# Patient Record
Sex: Male | Born: 1945
Health system: Southern US, Community
[De-identification: ages and names within clinical notes are randomized; demographics above are authoritative.]

## PROBLEM LIST (undated history)

## (undated) DIAGNOSIS — Z973 Presence of spectacles and contact lenses: Secondary | ICD-10-CM

## (undated) DIAGNOSIS — K219 Gastro-esophageal reflux disease without esophagitis: Secondary | ICD-10-CM

## (undated) DIAGNOSIS — G5603 Carpal tunnel syndrome, bilateral upper limbs: Secondary | ICD-10-CM

## (undated) DIAGNOSIS — R7303 Prediabetes: Secondary | ICD-10-CM

## (undated) DIAGNOSIS — R112 Nausea with vomiting, unspecified: Secondary | ICD-10-CM

## (undated) DIAGNOSIS — G5600 Carpal tunnel syndrome, unspecified upper limb: Secondary | ICD-10-CM

## (undated) DIAGNOSIS — J189 Pneumonia, unspecified organism: Secondary | ICD-10-CM

## (undated) DIAGNOSIS — R7309 Other abnormal glucose: Secondary | ICD-10-CM

## (undated) DIAGNOSIS — I10 Essential (primary) hypertension: Secondary | ICD-10-CM

## (undated) DIAGNOSIS — E785 Hyperlipidemia, unspecified: Secondary | ICD-10-CM

## (undated) DIAGNOSIS — I4891 Unspecified atrial fibrillation: Secondary | ICD-10-CM

## (undated) DIAGNOSIS — N400 Enlarged prostate without lower urinary tract symptoms: Secondary | ICD-10-CM

## (undated) DIAGNOSIS — H269 Unspecified cataract: Secondary | ICD-10-CM

## (undated) DIAGNOSIS — Z9889 Other specified postprocedural states: Secondary | ICD-10-CM

## (undated) DIAGNOSIS — E559 Vitamin D deficiency, unspecified: Secondary | ICD-10-CM

## (undated) DIAGNOSIS — E669 Obesity, unspecified: Secondary | ICD-10-CM

## (undated) DIAGNOSIS — F419 Anxiety disorder, unspecified: Secondary | ICD-10-CM

## (undated) DIAGNOSIS — R102 Pelvic and perineal pain: Secondary | ICD-10-CM

## (undated) DIAGNOSIS — K589 Irritable bowel syndrome without diarrhea: Secondary | ICD-10-CM

## (undated) DIAGNOSIS — M1990 Unspecified osteoarthritis, unspecified site: Secondary | ICD-10-CM

## (undated) DIAGNOSIS — Z85828 Personal history of other malignant neoplasm of skin: Secondary | ICD-10-CM

## (undated) DIAGNOSIS — M5020 Other cervical disc displacement, unspecified cervical region: Secondary | ICD-10-CM

## (undated) HISTORY — DX: Irritable bowel syndrome, unspecified: K58.9

## (undated) HISTORY — PX: TONSILLECTOMY: SUR1361

## (undated) HISTORY — DX: Benign prostatic hyperplasia without lower urinary tract symptoms: N40.0

## (undated) HISTORY — DX: Obesity, unspecified: E66.9

## (undated) HISTORY — PX: HERNIA REPAIR: SHX51

## (undated) HISTORY — DX: Personal history of other malignant neoplasm of skin: Z85.828

## (undated) HISTORY — DX: Prediabetes: R73.03

## (undated) HISTORY — DX: Essential (primary) hypertension: I10

## (undated) HISTORY — DX: Other abnormal glucose: R73.09

## (undated) HISTORY — DX: Vitamin D deficiency, unspecified: E55.9

## (undated) HISTORY — DX: Hyperlipidemia, unspecified: E78.5

---

## 1968-07-11 HISTORY — PX: OTHER SURGICAL HISTORY: SHX169

## 1978-07-11 HISTORY — PX: OTHER SURGICAL HISTORY: SHX169

## 1988-07-11 HISTORY — PX: ANTERIOR CRUCIATE LIGAMENT REPAIR: SHX115

## 1997-07-11 HISTORY — PX: HIP SURGERY: SHX245

## 1997-12-19 ENCOUNTER — Ambulatory Visit (HOSPITAL_COMMUNITY): Admission: RE | Admit: 1997-12-19 | Discharge: 1997-12-19 | Payer: Self-pay | Admitting: Internal Medicine

## 1998-07-11 HISTORY — PX: CERVICAL FUSION: SHX112

## 1999-06-10 ENCOUNTER — Other Ambulatory Visit: Admission: RE | Admit: 1999-06-10 | Discharge: 1999-06-10 | Payer: Self-pay | Admitting: Urology

## 2000-07-11 HISTORY — PX: OTHER SURGICAL HISTORY: SHX169

## 2001-07-11 HISTORY — PX: UMBILICAL HERNIA REPAIR: SHX196

## 2001-12-19 ENCOUNTER — Encounter: Payer: Self-pay | Admitting: Neurosurgery

## 2001-12-19 ENCOUNTER — Encounter: Admission: RE | Admit: 2001-12-19 | Discharge: 2001-12-19 | Payer: Self-pay | Admitting: Neurosurgery

## 2002-01-25 ENCOUNTER — Ambulatory Visit (HOSPITAL_COMMUNITY): Admission: RE | Admit: 2002-01-25 | Discharge: 2002-01-26 | Payer: Self-pay | Admitting: Neurosurgery

## 2002-01-25 ENCOUNTER — Encounter: Payer: Self-pay | Admitting: Neurosurgery

## 2002-01-25 HISTORY — PX: ANTERIOR CERVICAL DECOMP/DISCECTOMY FUSION: SHX1161

## 2002-02-19 ENCOUNTER — Encounter: Payer: Self-pay | Admitting: Neurosurgery

## 2002-02-19 ENCOUNTER — Encounter: Admission: RE | Admit: 2002-02-19 | Discharge: 2002-02-19 | Payer: Self-pay | Admitting: Neurosurgery

## 2004-07-21 ENCOUNTER — Ambulatory Visit: Payer: Self-pay | Admitting: Gastroenterology

## 2004-08-04 ENCOUNTER — Encounter: Admission: RE | Admit: 2004-08-04 | Discharge: 2004-08-04 | Payer: Self-pay | Admitting: Neurosurgery

## 2004-08-20 ENCOUNTER — Encounter: Admission: RE | Admit: 2004-08-20 | Discharge: 2004-08-20 | Payer: Self-pay | Admitting: Neurosurgery

## 2004-11-04 ENCOUNTER — Encounter: Admission: RE | Admit: 2004-11-04 | Discharge: 2004-11-04 | Payer: Self-pay | Admitting: Neurosurgery

## 2005-06-08 ENCOUNTER — Encounter: Admission: RE | Admit: 2005-06-08 | Discharge: 2005-06-08 | Payer: Self-pay | Admitting: Orthopedic Surgery

## 2005-07-11 DIAGNOSIS — I10 Essential (primary) hypertension: Secondary | ICD-10-CM

## 2005-07-11 HISTORY — DX: Essential (primary) hypertension: I10

## 2005-09-20 ENCOUNTER — Encounter: Admission: RE | Admit: 2005-09-20 | Discharge: 2005-09-20 | Payer: Self-pay | Admitting: Orthopedic Surgery

## 2006-01-18 ENCOUNTER — Encounter: Admission: RE | Admit: 2006-01-18 | Discharge: 2006-01-18 | Payer: Self-pay | Admitting: Orthopedic Surgery

## 2006-02-03 ENCOUNTER — Encounter: Admission: RE | Admit: 2006-02-03 | Discharge: 2006-02-03 | Payer: Self-pay | Admitting: Orthopedic Surgery

## 2006-07-21 ENCOUNTER — Ambulatory Visit: Payer: Self-pay | Admitting: Gastroenterology

## 2006-08-08 ENCOUNTER — Ambulatory Visit: Payer: Self-pay | Admitting: Gastroenterology

## 2007-02-02 ENCOUNTER — Encounter: Admission: RE | Admit: 2007-02-02 | Discharge: 2007-02-02 | Payer: Self-pay | Admitting: Orthopedic Surgery

## 2007-02-07 ENCOUNTER — Encounter (INDEPENDENT_AMBULATORY_CARE_PROVIDER_SITE_OTHER): Payer: Self-pay | Admitting: General Surgery

## 2007-02-07 ENCOUNTER — Observation Stay (HOSPITAL_COMMUNITY): Admission: EM | Admit: 2007-02-07 | Discharge: 2007-02-08 | Payer: Self-pay | Admitting: Emergency Medicine

## 2007-02-07 HISTORY — PX: APPENDECTOMY: SHX54

## 2007-02-21 ENCOUNTER — Encounter: Admission: RE | Admit: 2007-02-21 | Discharge: 2007-02-21 | Payer: Self-pay | Admitting: Orthopedic Surgery

## 2007-04-06 ENCOUNTER — Encounter: Admission: RE | Admit: 2007-04-06 | Discharge: 2007-04-06 | Payer: Self-pay | Admitting: Orthopedic Surgery

## 2007-09-22 ENCOUNTER — Emergency Department (HOSPITAL_COMMUNITY): Admission: EM | Admit: 2007-09-22 | Discharge: 2007-09-22 | Payer: Self-pay | Admitting: Emergency Medicine

## 2009-05-19 ENCOUNTER — Encounter: Admission: RE | Admit: 2009-05-19 | Discharge: 2009-05-19 | Payer: Self-pay | Admitting: Orthopedic Surgery

## 2010-11-12 ENCOUNTER — Other Ambulatory Visit: Payer: Self-pay | Admitting: Orthopedic Surgery

## 2010-11-12 DIAGNOSIS — M549 Dorsalgia, unspecified: Secondary | ICD-10-CM

## 2010-11-23 NOTE — Consult Note (Signed)
NAME:  [name], CONLEY NO.:  [id number]   MEDICAL RECORD NO.:  [id number]          PATIENT TYPE:  EMS   LOCATION:  ED                           FACILITY:  [facility]   PHYSICIAN:  Sigmund I. Patsi Sears, M.D.DATE OF BIRTH:  [birth year]   DATE OF CONSULTATION:  09/22/2007  DATE OF DISCHARGE:                                 CONSULTATION   SUBJECT:  Acute urinary retention in a 65 year old white male who is 10  days status post negative transrectal needle biopsy of the prostate.   The patient was voiding well after his prostate biopsy but noted severe  dysuria, an episode of gross hematuria over the last 24 hours.  This was  associated with drinking two beers.  The patient has been unable to void  and has severe urethral pain.  He took a Urised but it did not take away  his pain.  He has continued to be unable to void.  The patient was  instructed to come to the emergency room, where a Foley catheter was  placed and he was given a gentamicin injection.   ALLERGIES:  None known.   HOME MEDICATIONS:  1. Crestor 20 mg one per day.  2. Atenolol 100 mg one per day.  3. Enalapril 20 mg one per day.  4. Multivitamins one per day.   His weight is 248 pounds.   Tobacco none, alcohol none.   SOCIAL HISTORY:  The patient lives by himself.   A constitutional review of systems is otherwise noncontributory.   FAMILY HISTORY:  Noncontributory.   PAST MEDICAL HISTORY:  Significant for hypertension.   Physical exam today shows a well-developed, well-nourished white male,  in no acute distress after Foley catheter placed.  VITAL SIGNS:  Blood pressure 165/85, temperature 97, pulse 67,  respiratory rate 20.  NECK:  Supple, nontender, no nodes.  CHEST:  Clear to P&A.  ABDOMEN:  Soft, plus bowel sounds, no organomegaly, without masses.  Penis shows a normal circumcised penis.  Meatus is normal.  Scrotum is  normal with normal testicles palpated bilaterally.  There is no  tenderness or erythema.  The spermatic cord is normal bilaterally.  Perineum is normal.  Foley catheter is in place.  EXTREMITIES:  No cyanosis, no edema.   Emergency room urinalysis showed specific gravity 1.013, pH 6.0.  Dipstick is negative.   ASSESSMENT:  Acute urinary retention with one episode of hematuria,  probably secondary to his biopsy from 10 days ago, with severe dysuria.  The patient is covered with gentamicin, despite normal urinalysis, for  possible prostatitis.  He was given gentamicin 120 mg one-time dose.  He  will be sent home on Septra Double Strength one b.i.d. and Flomax.  He  is discharged in stable condition.  He will return Monday for a voiding  trial.      Sigmund I. Patsi Sears, M.D.  Electronically Signed     SIT/MEDQ  D:  09/22/2007  T:  09/23/2007  Job:  [id number]

## 2010-11-23 NOTE — Op Note (Signed)
[name], Johnathan NO.:  [id number]   MEDICAL RECORD NO.:  [id number]          PATIENT TYPE:  INP   LOCATION:  0098                         FACILITY:  [facility]   PHYSICIAN:  Angelia Mould. Derrell Lolling, M.D.DATE OF BIRTH:  07-29-1945   DATE OF PROCEDURE:  02/07/2007  DATE OF DISCHARGE:                               OPERATIVE REPORT   PREOPERATIVE DIAGNOSIS:  Acute appendicitis.   POSTOPERATIVE DIAGNOSIS:  Acute appendicitis.   OPERATION PERFORMED:  Laparoscopic appendectomy.   SURGEON:  Dr. Claud Kelp.   OPERATIVE INDICATIONS:  This is a 65 year old white man who has been in  good health recently.  He presents with an 18-hour history of right  lower quadrant pain and anorexia.  Dr. Oneta Rack sent him for a CT scan at  [facility] and this shows acute appendicitis but no evidence of  abscess.  He came to the emergency room and was then evaluated by the  emergency department physician.  I was then called.  He is brought to  the operating room urgently.   OPERATIVE FINDINGS:  The appendix was acutely inflamed but was not  ruptured.  There was some purulent fluid around it.  There was no  evidence of gangrene.  The last 3 or 4 feet of terminal ileum looked  normal.  The right colon looked normal.  There was no abscess.   OPERATIVE TECHNIQUE:  Following the induction of general endotracheal  anesthesia the patient's abdomen was prepped and draped in a sterile  fashion.  The patient was identified as the correct patient and correct  procedure.  A Foley catheter had been inserted prior to the abdominal  prep.  Intravenous antibiotics had been given prior to the incision.  An  11-mm OptiVu port was placed in the left rectus sheath just above the  umbilicus.  This was uneventful.  Pneumoperitoneum was created.  The  video camera was inserted with visualization and findings as described  above.   A 5-mm trocar was placed in the left rectus sheath below the umbilicus  and an 11 mm trocar was placed in the suprapubic area.  We identified  the terminal ileum and appendix and cecum.  We elevated the appendix and  examined it both medially and laterally.  We divided the appendiceal  mesentery and appendiceal artery using the Harmonic scalpel.  We took  this dissection down until we could clearly identify the junction of the  appendix with the cecum.   The appendix was then transected using an Endo-GIA stapling device.  This was placed perpendicular across the appendix at the level of the  cecum, closed, held in place for 30 seconds and then fired and removed.  The appendix was placed in the specimen bag and removed.  The right  lower quadrant was irrigated.  The staple line looked excellent.  There  was no bleeding or hematoma.  All the irrigation fluid was removed.  The  trocars were removed under direct vision and there was no bleeding from  the trocar sites.  The pneumoperitoneum was released.  The skin  incisions were closed with subcuticular  sutures of 4-0 Monocryl and  Steri-Strips.  Clean bandages were placed and the patient was taken to  the recovery room in stable condition.  Estimated blood loss was about  10 mL.  Complications none.  Sponge, needle and instrument counts were  correct.      Angelia Mould. Derrell Lolling, M.D.  Electronically Signed     HMI/MEDQ  D:  02/07/2007  T:  02/08/2007  Job:  [id number]   cc:   [name], M.D.  Fax: [phone]

## 2010-11-23 NOTE — H&P (Signed)
[name], GAINS NO.:  [id number]   MEDICAL RECORD NO.:  [id number]          PATIENT TYPE:  INP   LOCATION:  0098                         FACILITY:  [facility]   PHYSICIAN:  Angelia Mould. Derrell Lolling, M.D.DATE OF BIRTH:  02-Jun-1946   DATE OF ADMISSION:  02/07/2007  DATE OF DISCHARGE:                              HISTORY & PHYSICAL   CHIEF COMPLAINT:  Abdominal pain.   HISTORY OF PRESENT ILLNESS:  This is a 65 year old, white man who  presents with an 18-hour history of gassy, crampy, abdominal pain and  more steady right lower quadrant pain and anorexia.  He denies nausea or  vomiting.  Denies fever or chills.  He had a normal bowel movement  today.  He has not had any prior similar episodes.   Dr. Oneta Rack sent him for a CT scan and [facility].  The report shows  a fluid filled, enlarged, inflamed appendix.  There is no sign of  abscess.  The patient was sent to the Emergency Department.  The patient  was seen by Dr. Freida Busman.  I was then called at that point.  I agree with  the diagnosis of appendicitis and advised the patient to undergo  appendectomy which he agrees to.   PAST HISTORY:  Hemorrhoid problems.  Elevated PSA, borderline, followed  by Dr. Patsi Sears for benign prostatic hypertrophy.  Degenerative joint  disease.  Degenerative joint disease.  Left inguinal hernia repair in  1970.  Umbilical hernia repair.  Left rotator cuff repair.  Left hip  surgery.  Right knee surgery.  Left ankle surgery.  Neck surgeries x2.   CURRENT MEDICATIONS:  1. Atenolol 100 mg daily.  2. Crestor 20 mg daily.  3. Vitamin C.  4. Fish oil.  5. Aspirin 81 mg.  6. Antiinflammatories.   DRUG ALLERGIES:  NONE KNOWN.   FAMILY HISTORY:  Father deceased alcohol problems, coronary artery  disease, and myocardial infarction.  Mother deceased cerebral  hemorrhage.  He is an only child.   SOCIAL HISTORY:  The patient is a widow.  His wife died of lymphoma  about a year ago.  He is  semi-retired.  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  Denies tobacco.  Drinks 3-5 alcoholic  beverages a week.   REVIEW OF SYSTEMS:  A 15 system review of systems is performed and is  noncontributory except as described above.   PHYSICAL EXAMINATION:  GENERAL:  Pleasant, middle-aged man who appears  to be in mild distress, very cooperative.  VITAL SIGNS:  Pending.  EYES:  Sclerae are clear.  Extraocular movements intact.  EARS, NOSE, MOUTH, AND THROAT:  Nose, lips, tongue, and oropharynx are  without gross lesions.  NECK:  Supple, nontender.  No mass.  No jugular distension.  LUNGS:  Clear to auscultation.  No chest wall tenderness.  HEART:  Regular rate and rhythm.  Radial and femoral pulses are  palpable.  ABDOMEN:  Soft.  Localized tenderness with involuntary guarding in the  right lower quadrant.  Some spasticity there.  No mass noted anywhere.  Well-healed transverse scar above the umbilicus.  Well-healed left groin  scar.  GENITOURINARY:  No evidence of inguinal adenopathy or hernia.  EXTREMITIES:  Moves all 4 extremities well without pain or deformity.  NEUROLOGIC:  No gross motor or sensory deficits.   ADMISSION DATA:  White blood cell count 15,000, hemoglobin 17.  The rest  of his lab work is pending.  The CT scan is described above.   ASSESSMENT:  Acute appendicitis.   PLAN:  The patient will be taken to Operating Room for diagnostic  laparoscopy and laparoscopic appendectomy.   I have discussed the indication and details of surgery with the patient.  Risks and complications have been outlined, including but not limited to  bleeding, infection, conversion to open laparotomy, alternative  diagnosis such as inflammatory neoplasm, diverticulitis, Meckel's,  diverticulitis, and so forth.  Also discussed cardiac, pulmonary, and  thromboembolic problems and injuries to adjacent organs.  He seems to  understand these issues well.  At this time, all  of his questions are  answered.  He is in full agreement with this plan.      Angelia Mould. Derrell Lolling, M.D.  Electronically Signed     HMI/MEDQ  D:  02/07/2007  T:  02/08/2007  Job:  [id number]   cc:   [name], M.D.  Fax: [phone]

## 2010-11-24 ENCOUNTER — Other Ambulatory Visit: Payer: Self-pay | Admitting: Orthopedic Surgery

## 2010-11-24 ENCOUNTER — Ambulatory Visit
Admission: RE | Admit: 2010-11-24 | Discharge: 2010-11-24 | Disposition: A | Discharge status: Home / Self Care | Payer: BC Managed Care – PPO | Source: Ambulatory Visit | Attending: Orthopedic Surgery | Admitting: Orthopedic Surgery

## 2010-11-24 DIAGNOSIS — M549 Dorsalgia, unspecified: Secondary | ICD-10-CM

## 2010-11-25 ENCOUNTER — Other Ambulatory Visit: Payer: Self-pay

## 2010-11-26 NOTE — Assessment & Plan Note (Signed)
[name]                         GASTROENTEROLOGY OFFICE NOTE   NAME:[name], [name]                    MRN:          [id number]  DATE:07/21/2005                            DOB:          08/19/1945    PROBLEM:  Rectal bleeding.   [name] is a pleasant 65 year old white male referred through the  courtesy of Dr. Oneta Rack for evaluation.  On intermittent occasions he  has had limited bright red blood per rectum which he attributes to a  hemorrhoid.  He denies rectal pain or itching.  This had been an  intermittent problem over the past 2 years.  He denies change of bowel  habits, or frank hematochezia, or abdominal pain.   PAST MEDICAL HISTORY:  Pertinent for hypertension, status post hip  surgery and herniorrhaphy.   FAMILY HISTORY:  Noncontributory.   MEDICATIONS:  1. Crestor.  2. Zolpidem.  3. Atenolol.  4. Enalapril.   HE HAS NO ALLERGIES.   He does not smoke, he drinks rarely, he is widowed and in sales.   REVIEW OF SYSTEMS:  Was reviewed and was negative.   PHYSICAL EXAMINATION:  Pulse 64, blood pressure 126/80, weight 251.  HEENT: EOMI. PERRLA. Sclerae are anicteric.  Conjunctivae are pink.  NECK:  Supple without thyromegaly, adenopathy or carotid bruits.  CHEST:  Clear to auscultation and percussion without adventitious  sounds.  CARDIAC:  Regular rhythm; normal S1 S2.  There are no murmurs, gallops  or rubs.  ABDOMEN:  Bowel sounds are normoactive.  Abdomen is soft, non-tender and  non-distended.  There are no abdominal masses, tenderness, splenic  enlargement or hepatomegaly.  EXTREMITIES:  Full range of motion.  No cyanosis, clubbing or edema.  RECTAL:  There are no external lesions.  There are no masses.  Stool is  Hemoccult negative.   IMPRESSION:  Limited rectal bleeding, likely secondary to hemorrhoids.   RECOMMENDATION:  1. AnaMantle HC 1 nightly for 7 days.  If bleeding is a persistent or      recurrent  problem, I would      consider band ligation.  2. Screening colonoscopy.     Barbette Hair. Johnathan Dice, MD,FACG  Electronically Signed    RDK/MedQ  DD: 07/21/2006  DT: 07/21/2006  Job #: [id number]   cc:   [name], M.D.
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
## 2010-11-26 NOTE — Letter (Signed)
July 21, 2006    [name], M.D.  [address]  [place], [place] [address]   RE:  HOVANES, HYMAS  MRN:  [id number]  /  DOB:  Nov 06, 1945   Dear Dr. Oneta Rack:   Upon your kind referral, I had the pleasure of evaluating your patient  and I am pleased to offer my findings.  I saw [name] in the  office today.  Enclosed is a copy of my progress note that details my  findings and recommendations.   Thank you for the opportunity to participate in your patient's care.    Sincerely,      Barbette Hair. Arlyce Dice, MD,FACG  Electronically Signed    RDK/MedQ  DD: 07/21/2006  DT: 07/21/2006  Job #: [id number]

## 2010-12-16 ENCOUNTER — Other Ambulatory Visit: Payer: Self-pay | Admitting: Orthopedic Surgery

## 2010-12-16 DIAGNOSIS — M25511 Pain in right shoulder: Secondary | ICD-10-CM

## 2010-12-28 ENCOUNTER — Ambulatory Visit
Admission: RE | Admit: 2010-12-28 | Discharge: 2010-12-28 | Disposition: A | Discharge status: Home / Self Care | Payer: BC Managed Care – PPO | Source: Ambulatory Visit | Attending: Orthopedic Surgery | Admitting: Orthopedic Surgery

## 2010-12-28 DIAGNOSIS — M25511 Pain in right shoulder: Secondary | ICD-10-CM

## 2011-04-04 LAB — URINALYSIS, ROUTINE W REFLEX MICROSCOPIC
Ketones, ur: NEGATIVE
Nitrite: NEGATIVE
pH: 6

## 2011-04-25 LAB — APTT: aPTT: 27

## 2011-04-25 LAB — DIFFERENTIAL
Basophils Absolute: 0
Basophils Relative: 0
Neutro Abs: 12.4 — ABNORMAL HIGH
Neutrophils Relative %: 82 — ABNORMAL HIGH

## 2011-04-25 LAB — CBC
HCT: 49.1
MCHC: 35.2
MCV: 93.5
Platelets: 214
RDW: 14

## 2011-04-25 LAB — URINALYSIS, ROUTINE W REFLEX MICROSCOPIC
Bilirubin Urine: NEGATIVE
Ketones, ur: NEGATIVE
Nitrite: NEGATIVE
Protein, ur: 30 — AB
Specific Gravity, Urine: >1.046 — ABNORMAL HIGH
Urobilinogen, UA: 0.2

## 2011-04-25 LAB — COMPREHENSIVE METABOLIC PANEL
Albumin: 4.5
BUN: 12
Creatinine, Ser: 0.83
Total Protein: 7.7

## 2011-04-25 LAB — PROTIME-INR: INR: 1

## 2011-04-25 LAB — URINE MICROSCOPIC-ADD ON

## 2011-05-23 ENCOUNTER — Ambulatory Visit (INDEPENDENT_AMBULATORY_CARE_PROVIDER_SITE_OTHER): Payer: Medicare Other | Admitting: Gastroenterology

## 2011-05-23 ENCOUNTER — Encounter: Payer: Self-pay | Admitting: Gastroenterology

## 2011-05-23 DIAGNOSIS — R1032 Left lower quadrant pain: Secondary | ICD-10-CM

## 2011-05-23 DIAGNOSIS — K625 Hemorrhage of anus and rectum: Secondary | ICD-10-CM

## 2011-05-23 MED ORDER — HYOSCYAMINE SULFATE 0.125 MG SL SUBL: 0.2500 mg | SUBLINGUAL_TABLET | SUBLINGUAL | Status: DC | PRN

## 2011-05-23 NOTE — Progress Notes (Signed)
Mr. Dec is a 65 year old white male referred at the request of [name], MD for evaluation of rectal bleeding. Approximately one month ago he noted small amounts of bright red blood in the toilet water. He also noticed some streaking of blood on his stool. Symptoms lasted about 4 days. There has been no recurrence of bleeding. He denies rectal pain. He occasionally has left lower quadrant discomfort which has been a problem for over 30 years. Colonoscopy in 2010  demonstrated diverticulosis and hemorrhoids.      Past Medical History  Diagnosis Date  . Hypertension   . Hyperlipidemia    Past Surgical History  Procedure Date  . Hernia repair   . Knee arthroscopy   . Appendectomy   . Shoulder arthroscopy   . Ankle arthroscopy    Current Outpatient Prescriptions  Medication Sig Dispense Refill  . aspirin 81 MG tablet Take 81 mg by mouth daily.        Marland Kitchen atenolol (TENORMIN) 25 MG tablet Take 25 mg by mouth daily.        . enalapril (VASOTEC) 5 MG tablet Take 5 mg by mouth daily.        . rosuvastatin (CRESTOR) 10 MG tablet Take 10 mg by mouth daily.          reports that he has never smoked. He has never used smokeless tobacco. He reports that he drinks alcohol. He reports that he does not use illicit drugs. family history includes Heart disease in his father and mother.    Review of Systems: Pertinent positive and negative review of systems were noted in the above HPI section. All other review of systems were otherwise negative.  Vital signs were reviewed in today's medical record Physical Exam: General: Well developed , well nourished, no acute distress Head: Normocephalic and atraumatic Eyes:  sclerae anicteric, EOMI Ears: Normal auditory acuity Mouth: No deformity or lesions Neck: Supple, no masses or thyromegaly Lungs: Clear throughout to auscultation Heart: Regular rate and rhythm; no murmurs, rubs or bruits Abdomen: Soft, non tender and non distended. No  masses, hepatosplenomegaly or hernias noted. Normal Bowel sounds Rectal:deferred Musculoskeletal: Symmetrical with no gross deformities  Skin: No lesions on visible extremities Pulses:  Normal pulses noted Extremities: No clubbing, cyanosis, edema or deformities noted Neurological: Alert oriented x 4, grossly nonfocal Cervical Nodes:  No significant cervical adenopathy Inguinal Nodes: No significant inguinal adenopathy Psychological:  Alert and cooperative. Normal mood and affect

## 2011-05-23 NOTE — Patient Instructions (Signed)
Flexible Sigmoidoscopy Your caregiver has ordered a flexible sigmoidoscopy. This is an exam to evaluate your lower colon. In this exam your colon is cleansed and a short fiber optic tube is inserted through your rectum and into your colon. The fiber optic scope (endoscope) is a short bundle of enclosed flexible small glass fibers. It transmits light to the area examined and images from that area to your caregiver. You do not have to worry about glass breakage in the endoscope. Discomfort is usually minimal. Sedatives and pain medications are generally not required. This exam helps to detect tumors (lumps), polyps, inflammation (swelling and soreness), and areas of bleeding. It may also be used to take biopsies. These are small pieces of tissue taken to examine under a microscope. LET YOUR CAREGIVER KNOW ABOUT:  Allergies.   Medications taken including herbs, eye drops, over the counter medications, and creams.   Use of steroids (by mouth or creams).   Previous problems with anesthetics or novocaine   Possibility of pregnancy, if this applies.   History of blood clots (thrombophlebitis).   History of bleeding or blood problems.   Previous surgery.   Other health problems.  BEFORE THE PROCEDURE Eat normally the night before the exam. Your caregiver may order a mild enema or laxative the night before. No eating or drinking should occur after midnight until the procedure is completed. A rectal suppository or enemas may be given in the morning prior to your procedure. You will be brought to the examination area in a hospital gown. You should be present 60 minutes prior to your procedure or as directed.  AFTER THE PROCEDURE   There is sometimes a little blood passed with the first bowel movement. Do not be concerned. Because air is often used during the exam, it is not unusual to pass gas and experience abdominal (belly) cramping. Walking or a warm pack on your abdomen may help with this. Do not  sleep with a heating pad as burns can occur.   You may resume all normal eating and activities.   Only take over-the-counter or prescription medicines for pain, discomfort, or fever as directed by your caregiver. Do not use aspirin or blood thinners if a biopsy (tissue sample) was taken. Consult your caregiver for medication usage if biopsies were taken.   Call for your results as instructed by your caregiver. Remember, it is your responsibility to obtain the results of your biopsy. Do not assume everything is fine because you do not hear from your caregiver.  SEEK IMMEDIATE MEDICAL CARE IF:  An oral temperature above 102 F (38.9 C) develops.   You pass large blood clots or fill a toilet with blood following the procedure. This may also occur 10 to 14 days following the procedure. It is more likely if a biopsy was taken.   You develop abdominal pain not relieved with medication or that is getting worse rather than better.  Document Released: 06/24/2000 Document Revised: 03/09/2011 Document Reviewed: 04/06/2005 [facility] Patient Information 2012 [place], [place].

## 2011-05-23 NOTE — Assessment & Plan Note (Signed)
This likely is due to hemorrhoidal bleeding or bleeding from the distal colon.  Recommendations #1 flexible sigmoidoscopy

## 2011-05-23 NOTE — Assessment & Plan Note (Addendum)
This is very nonspecific pain of many years duration.  Recommendations #1 renew hyomax. He has been responsive to hyomax in the past.

## 2011-05-30 ENCOUNTER — Encounter: Payer: Self-pay | Admitting: Gastroenterology

## 2011-05-30 ENCOUNTER — Ambulatory Visit (AMBULATORY_SURGERY_CENTER): Payer: Medicare Other | Admitting: Gastroenterology

## 2011-05-30 DIAGNOSIS — K625 Hemorrhage of anus and rectum: Secondary | ICD-10-CM

## 2011-05-30 DIAGNOSIS — Z1211 Encounter for screening for malignant neoplasm of colon: Secondary | ICD-10-CM

## 2011-05-30 DIAGNOSIS — R1032 Left lower quadrant pain: Secondary | ICD-10-CM

## 2011-05-30 HISTORY — PX: COLONOSCOPY: SHX174

## 2011-05-30 MED ORDER — SODIUM CHLORIDE 0.9 % IV SOLN: 500.0000 mL | INTRAVENOUS | Status: DC

## 2011-05-30 NOTE — Progress Notes (Signed)
Patient did not experience any of the following events: a burn prior to discharge; a fall within the facility; wrong site/side/patient/procedure/implant event; or a hospital transfer or hospital admission upon discharge from the facility. (G8907) Patient did not have preoperative order for IV antibiotic SSI prophylaxis. (G8918)  

## 2011-05-30 NOTE — Patient Instructions (Signed)

## 2011-05-31 ENCOUNTER — Telehealth: Payer: Self-pay | Admitting: *Deleted

## 2011-05-31 NOTE — Telephone Encounter (Signed)

## 2011-08-08 DIAGNOSIS — M75120 Complete rotator cuff tear or rupture of unspecified shoulder, not specified as traumatic: Secondary | ICD-10-CM | POA: Diagnosis not present

## 2011-08-08 DIAGNOSIS — M67919 Unspecified disorder of synovium and tendon, unspecified shoulder: Secondary | ICD-10-CM | POA: Diagnosis not present

## 2011-08-08 DIAGNOSIS — S43439A Superior glenoid labrum lesion of unspecified shoulder, initial encounter: Secondary | ICD-10-CM | POA: Diagnosis not present

## 2011-09-01 DIAGNOSIS — J042 Acute laryngotracheitis: Secondary | ICD-10-CM | POA: Diagnosis not present

## 2011-09-01 DIAGNOSIS — J029 Acute pharyngitis, unspecified: Secondary | ICD-10-CM | POA: Diagnosis not present

## 2011-09-20 DIAGNOSIS — E559 Vitamin D deficiency, unspecified: Secondary | ICD-10-CM | POA: Diagnosis not present

## 2011-09-20 DIAGNOSIS — Z79899 Other long term (current) drug therapy: Secondary | ICD-10-CM | POA: Diagnosis not present

## 2011-09-20 DIAGNOSIS — I10 Essential (primary) hypertension: Secondary | ICD-10-CM | POA: Diagnosis not present

## 2011-09-20 DIAGNOSIS — N320 Bladder-neck obstruction: Secondary | ICD-10-CM | POA: Diagnosis not present

## 2011-09-20 DIAGNOSIS — N419 Inflammatory disease of prostate, unspecified: Secondary | ICD-10-CM | POA: Diagnosis not present

## 2011-09-20 DIAGNOSIS — R7309 Other abnormal glucose: Secondary | ICD-10-CM | POA: Diagnosis not present

## 2011-09-20 DIAGNOSIS — Z125 Encounter for screening for malignant neoplasm of prostate: Secondary | ICD-10-CM | POA: Diagnosis not present

## 2011-09-20 DIAGNOSIS — Z1212 Encounter for screening for malignant neoplasm of rectum: Secondary | ICD-10-CM | POA: Diagnosis not present

## 2011-09-20 DIAGNOSIS — E782 Mixed hyperlipidemia: Secondary | ICD-10-CM | POA: Diagnosis not present

## 2011-10-26 DIAGNOSIS — N401 Enlarged prostate with lower urinary tract symptoms: Secondary | ICD-10-CM | POA: Diagnosis not present

## 2011-10-26 DIAGNOSIS — E291 Testicular hypofunction: Secondary | ICD-10-CM | POA: Diagnosis not present

## 2011-11-02 DIAGNOSIS — N529 Male erectile dysfunction, unspecified: Secondary | ICD-10-CM | POA: Diagnosis not present

## 2011-11-02 DIAGNOSIS — R972 Elevated prostate specific antigen [PSA]: Secondary | ICD-10-CM | POA: Diagnosis not present

## 2011-11-02 DIAGNOSIS — E291 Testicular hypofunction: Secondary | ICD-10-CM | POA: Diagnosis not present

## 2011-11-02 DIAGNOSIS — N490 Inflammatory disorders of seminal vesicle: Secondary | ICD-10-CM | POA: Diagnosis not present

## 2011-11-22 DIAGNOSIS — N419 Inflammatory disease of prostate, unspecified: Secondary | ICD-10-CM | POA: Diagnosis not present

## 2011-11-22 DIAGNOSIS — R350 Frequency of micturition: Secondary | ICD-10-CM | POA: Diagnosis not present

## 2011-11-23 ENCOUNTER — Other Ambulatory Visit: Payer: Self-pay | Admitting: Urology

## 2011-12-01 ENCOUNTER — Encounter (HOSPITAL_BASED_OUTPATIENT_CLINIC_OR_DEPARTMENT_OTHER): Payer: Self-pay | Admitting: *Deleted

## 2011-12-02 ENCOUNTER — Encounter (HOSPITAL_BASED_OUTPATIENT_CLINIC_OR_DEPARTMENT_OTHER): Payer: Self-pay | Admitting: *Deleted

## 2011-12-02 NOTE — Progress Notes (Signed)
NPO AFTER MN. ARRIVES AT 1100. NEEDS ISTAT AND EKG.  

## 2011-12-08 ENCOUNTER — Encounter (HOSPITAL_BASED_OUTPATIENT_CLINIC_OR_DEPARTMENT_OTHER): Admission: RE | Payer: Self-pay | Source: Ambulatory Visit

## 2011-12-08 ENCOUNTER — Ambulatory Visit (HOSPITAL_BASED_OUTPATIENT_CLINIC_OR_DEPARTMENT_OTHER): Admission: RE | Admit: 2011-12-08 | Payer: Medicare Other | Source: Ambulatory Visit | Admitting: Urology

## 2011-12-08 DIAGNOSIS — L600 Ingrowing nail: Secondary | ICD-10-CM | POA: Diagnosis not present

## 2011-12-08 HISTORY — DX: Unspecified osteoarthritis, unspecified site: M19.90

## 2011-12-08 HISTORY — DX: Gastro-esophageal reflux disease without esophagitis: K21.9

## 2011-12-08 HISTORY — DX: Pelvic and perineal pain: R10.2

## 2011-12-08 HISTORY — DX: Benign prostatic hyperplasia without lower urinary tract symptoms: N40.0

## 2011-12-08 SURGERY — CYSTOSCOPY, WITH BLADDER HYDRODISTENSION
Anesthesia: General

## 2011-12-20 DIAGNOSIS — E559 Vitamin D deficiency, unspecified: Secondary | ICD-10-CM | POA: Diagnosis not present

## 2011-12-20 DIAGNOSIS — E782 Mixed hyperlipidemia: Secondary | ICD-10-CM | POA: Diagnosis not present

## 2011-12-20 DIAGNOSIS — I10 Essential (primary) hypertension: Secondary | ICD-10-CM | POA: Diagnosis not present

## 2011-12-20 DIAGNOSIS — Z79899 Other long term (current) drug therapy: Secondary | ICD-10-CM | POA: Diagnosis not present

## 2011-12-20 DIAGNOSIS — R7309 Other abnormal glucose: Secondary | ICD-10-CM | POA: Diagnosis not present

## 2012-01-09 DIAGNOSIS — N529 Male erectile dysfunction, unspecified: Secondary | ICD-10-CM | POA: Diagnosis not present

## 2012-01-09 DIAGNOSIS — N419 Inflammatory disease of prostate, unspecified: Secondary | ICD-10-CM | POA: Diagnosis not present

## 2012-01-16 DIAGNOSIS — M76899 Other specified enthesopathies of unspecified lower limb, excluding foot: Secondary | ICD-10-CM | POA: Diagnosis not present

## 2012-01-16 DIAGNOSIS — M75120 Complete rotator cuff tear or rupture of unspecified shoulder, not specified as traumatic: Secondary | ICD-10-CM | POA: Diagnosis not present

## 2012-01-28 ENCOUNTER — Other Ambulatory Visit: Payer: Self-pay | Admitting: Gastroenterology

## 2012-02-09 DIAGNOSIS — H1030 Unspecified acute conjunctivitis, unspecified eye: Secondary | ICD-10-CM | POA: Diagnosis not present

## 2012-02-21 DIAGNOSIS — H16209 Unspecified keratoconjunctivitis, unspecified eye: Secondary | ICD-10-CM | POA: Diagnosis not present

## 2012-02-29 DIAGNOSIS — H16209 Unspecified keratoconjunctivitis, unspecified eye: Secondary | ICD-10-CM | POA: Diagnosis not present

## 2012-03-29 DIAGNOSIS — Z79899 Other long term (current) drug therapy: Secondary | ICD-10-CM | POA: Diagnosis not present

## 2012-03-29 DIAGNOSIS — Z23 Encounter for immunization: Secondary | ICD-10-CM | POA: Diagnosis not present

## 2012-03-29 DIAGNOSIS — R7309 Other abnormal glucose: Secondary | ICD-10-CM | POA: Diagnosis not present

## 2012-03-29 DIAGNOSIS — E782 Mixed hyperlipidemia: Secondary | ICD-10-CM | POA: Diagnosis not present

## 2012-03-29 DIAGNOSIS — E559 Vitamin D deficiency, unspecified: Secondary | ICD-10-CM | POA: Diagnosis not present

## 2012-03-29 DIAGNOSIS — I10 Essential (primary) hypertension: Secondary | ICD-10-CM | POA: Diagnosis not present

## 2012-05-14 DIAGNOSIS — N401 Enlarged prostate with lower urinary tract symptoms: Secondary | ICD-10-CM | POA: Diagnosis not present

## 2012-06-28 DIAGNOSIS — Z79899 Other long term (current) drug therapy: Secondary | ICD-10-CM | POA: Diagnosis not present

## 2012-06-28 DIAGNOSIS — E559 Vitamin D deficiency, unspecified: Secondary | ICD-10-CM | POA: Diagnosis not present

## 2012-06-28 DIAGNOSIS — I10 Essential (primary) hypertension: Secondary | ICD-10-CM | POA: Diagnosis not present

## 2012-06-28 DIAGNOSIS — E782 Mixed hyperlipidemia: Secondary | ICD-10-CM | POA: Diagnosis not present

## 2012-06-28 DIAGNOSIS — R7309 Other abnormal glucose: Secondary | ICD-10-CM | POA: Diagnosis not present

## 2012-08-09 DIAGNOSIS — M6530 Trigger finger, unspecified finger: Secondary | ICD-10-CM | POA: Diagnosis not present

## 2012-09-25 DIAGNOSIS — Z79899 Other long term (current) drug therapy: Secondary | ICD-10-CM | POA: Diagnosis not present

## 2012-09-25 DIAGNOSIS — R972 Elevated prostate specific antigen [PSA]: Secondary | ICD-10-CM | POA: Diagnosis not present

## 2012-09-25 DIAGNOSIS — E559 Vitamin D deficiency, unspecified: Secondary | ICD-10-CM | POA: Diagnosis not present

## 2012-09-25 DIAGNOSIS — I10 Essential (primary) hypertension: Secondary | ICD-10-CM | POA: Diagnosis not present

## 2012-09-25 DIAGNOSIS — E782 Mixed hyperlipidemia: Secondary | ICD-10-CM | POA: Diagnosis not present

## 2012-09-25 DIAGNOSIS — Z1212 Encounter for screening for malignant neoplasm of rectum: Secondary | ICD-10-CM | POA: Diagnosis not present

## 2012-09-25 DIAGNOSIS — R7309 Other abnormal glucose: Secondary | ICD-10-CM | POA: Diagnosis not present

## 2012-10-11 ENCOUNTER — Ambulatory Visit (HOSPITAL_COMMUNITY): Payer: Medicare Other | Attending: Internal Medicine | Admitting: Radiology

## 2012-10-11 VITALS — BP 119/85 | HR 52 | Ht 72.0 in | Wt 244.0 lb

## 2012-10-11 DIAGNOSIS — R079 Chest pain, unspecified: Secondary | ICD-10-CM

## 2012-10-11 DIAGNOSIS — R002 Palpitations: Secondary | ICD-10-CM | POA: Diagnosis not present

## 2012-10-11 DIAGNOSIS — R5381 Other malaise: Secondary | ICD-10-CM | POA: Insufficient documentation

## 2012-10-11 DIAGNOSIS — R5383 Other fatigue: Secondary | ICD-10-CM | POA: Insufficient documentation

## 2012-10-11 DIAGNOSIS — R0789 Other chest pain: Secondary | ICD-10-CM | POA: Diagnosis not present

## 2012-10-11 MED ORDER — TECHNETIUM TC 99M SESTAMIBI GENERIC - CARDIOLITE
10.0000 | Freq: Once | INTRAVENOUS | Status: AC | PRN
  Administered 2012-10-11: 10 via INTRAVENOUS

## 2012-10-11 MED ORDER — REGADENOSON 0.4 MG/5ML IV SOLN
0.4000 mg | Freq: Once | INTRAVENOUS | Status: AC
  Administered 2012-10-11: 0.4 mg via INTRAVENOUS

## 2012-10-11 MED ORDER — TECHNETIUM TC 99M SESTAMIBI GENERIC - CARDIOLITE
30.0000 | Freq: Once | INTRAVENOUS | Status: AC | PRN
  Administered 2012-10-11: 30 via INTRAVENOUS

## 2012-10-11 NOTE — Progress Notes (Signed)
[facility] SITE 3 NUCLEAR MED [address] [place], [place] [address] [phone]    Cardiology Nuclear Med Study  [name] is a 67 y.o. male     MRN : [id number]     DOB: 03-15-1946  Procedure Date: 10/11/2012  Nuclear Med Background Indication for Stress Test:  Evaluation for Ischemia History:  '03 MPS:OK per pt. Cardiac Risk Factors: Family History - CAD, Hypertension, Lipids and Obesity  Symptoms:  Chest Pressure.  (last episode of chest discomfort is now, 2/10), Fatigue and Palpitations   Nuclear Pre-Procedure Caffeine/Decaff Intake:  None NPO After: 7:00pm   Lungs:  Clear. O2 Sat: 98% on room air. IV 0.9% NS with Angio Cath:  20g  IV Site: R Antecubital  IV Started by:  Stanton Kidney, EMT-P  Chest Size (in):  46 Cup Size: n/a  Height: 6' (1.829 m)  Weight:  244 lb (110.678 kg)  BMI:  Body mass index is 33.09 kg/(m^2). Tech Comments:  Atenolol held > 10 hours, per patient.    Nuclear Med Study 1 or 2 day study: 1 day  Stress Test Type:  Treadmill/Lexiscan  Reading MD: Marca Ancona, MD  Order Authorizing Provider:  [name], MD  Resting Radionuclide: Technetium 37m Sestamibi  Resting Radionuclide Dose: 11.0 mCi   Stress Radionuclide:  Technetium 12m Sestamibi  Stress Radionuclide Dose: 33.0 mCi           Stress Protocol Rest HR: 52 Stress HR: 73  Rest BP: 119/85 Stress BP: 131/79  Exercise Time (min): 2:00 METS: n/a   Predicted Max HR: 154 bpm % Max HR: 47.4 bpm Rate Pressure Product: 9563   Dose of Adenosine (mg):  n/a Dose of Lexiscan: 0.4 mg  Dose of Atropine (mg): n/a Dose of Dobutamine: n/a mcg/kg/min (at max HR)  Stress Test Technologist: Smiley Houseman, CMA-N  Nuclear Technologist:  Domenic Polite, CNMT     Rest Procedure:  Myocardial perfusion imaging was performed at rest 45 minutes following the intravenous administration of Technetium 64m Sestamibi.  Rest ECG: NSR - Normal EKG  Stress Procedure:  The patient received  IV Lexiscan 0.4 mg over 15-seconds with concurrent low level exercise and then Technetium 84m Sestamibi was injected at 30-seconds while the patient continued walking one more minute.  He did c/o chest pressure and shortness of breath with Lexiscan.  Quantitative spect images were obtained after a 45-minute delay.  Stress ECG: No significant change from baseline ECG  QPS Raw Data Images:  Normal; no motion artifact; normal heart/lung ratio. Stress Images:  Small, mild basal inferior perfusion defect.  Rest Images:  Normal homogeneous uptake in all areas of the myocardium. Subtraction (SDS):  Reversible small, mild basal inferior perfusion defect.  Transient Ischemic Dilatation (Normal <1.22):  1.07 Lung/Heart Ratio (Normal <0.45):  0.38  Quantitative Gated Spect Images QGS EDV:  132 ml QGS ESV:  53 ml  Impression Exercise Capacity:  Lexiscan with low level exercise. BP Response:  Normal blood pressure response. Clinical Symptoms:  Chest pain ECG Impression:  No significant ST segment change suggestive of ischemia. Comparison with Prior Nuclear Study: No images to compare  Overall Impression:  Low risk stress nuclear study.  Reversible small, mild basal inferior perfusion defect noted.  Cannot rule out a small area of ischemia.  May be related to attenuation.   LV Ejection Fraction: 59%.  LV Wall Motion:  NL LV Function; NL Wall Motion  Marca Ancona 10/11/2012

## 2012-10-12 ENCOUNTER — Encounter (HOSPITAL_COMMUNITY): Payer: Self-pay | Admitting: Internal Medicine

## 2012-10-12 NOTE — Progress Notes (Addendum)
Nuclear report Faxed To Dr. Oneta Rack @ [phone]  Low risk study.  Please inform patient.  Cannot rule out small area of inferior ischemia.   Marca Ancona 10/14/2012

## 2012-10-15 NOTE — Progress Notes (Signed)
Pt was called with results, told him to call pcp for further advice on how to proceed. Pt agreed to plan.

## 2012-10-29 DIAGNOSIS — M7710 Lateral epicondylitis, unspecified elbow: Secondary | ICD-10-CM | POA: Diagnosis not present

## 2012-11-06 DIAGNOSIS — M47817 Spondylosis without myelopathy or radiculopathy, lumbosacral region: Secondary | ICD-10-CM | POA: Diagnosis not present

## 2012-11-15 DIAGNOSIS — M47817 Spondylosis without myelopathy or radiculopathy, lumbosacral region: Secondary | ICD-10-CM | POA: Diagnosis not present

## 2012-12-10 DIAGNOSIS — M47817 Spondylosis without myelopathy or radiculopathy, lumbosacral region: Secondary | ICD-10-CM | POA: Diagnosis not present

## 2012-12-20 DIAGNOSIS — R7309 Other abnormal glucose: Secondary | ICD-10-CM | POA: Diagnosis not present

## 2012-12-20 DIAGNOSIS — I10 Essential (primary) hypertension: Secondary | ICD-10-CM | POA: Diagnosis not present

## 2012-12-20 DIAGNOSIS — Z79899 Other long term (current) drug therapy: Secondary | ICD-10-CM | POA: Diagnosis not present

## 2012-12-20 DIAGNOSIS — E559 Vitamin D deficiency, unspecified: Secondary | ICD-10-CM | POA: Diagnosis not present

## 2012-12-20 DIAGNOSIS — E782 Mixed hyperlipidemia: Secondary | ICD-10-CM | POA: Diagnosis not present

## 2013-01-08 DIAGNOSIS — D485 Neoplasm of uncertain behavior of skin: Secondary | ICD-10-CM | POA: Diagnosis not present

## 2013-01-08 DIAGNOSIS — C44319 Basal cell carcinoma of skin of other parts of face: Secondary | ICD-10-CM | POA: Diagnosis not present

## 2013-01-08 DIAGNOSIS — L570 Actinic keratosis: Secondary | ICD-10-CM | POA: Diagnosis not present

## 2013-01-29 DIAGNOSIS — M6530 Trigger finger, unspecified finger: Secondary | ICD-10-CM | POA: Diagnosis not present

## 2013-03-13 DIAGNOSIS — M25529 Pain in unspecified elbow: Secondary | ICD-10-CM | POA: Diagnosis not present

## 2013-03-18 DIAGNOSIS — C44319 Basal cell carcinoma of skin of other parts of face: Secondary | ICD-10-CM | POA: Diagnosis not present

## 2013-04-02 DIAGNOSIS — E782 Mixed hyperlipidemia: Secondary | ICD-10-CM | POA: Diagnosis not present

## 2013-04-02 DIAGNOSIS — Z79899 Other long term (current) drug therapy: Secondary | ICD-10-CM | POA: Diagnosis not present

## 2013-04-02 DIAGNOSIS — I10 Essential (primary) hypertension: Secondary | ICD-10-CM | POA: Diagnosis not present

## 2013-04-02 DIAGNOSIS — E559 Vitamin D deficiency, unspecified: Secondary | ICD-10-CM | POA: Diagnosis not present

## 2013-04-02 DIAGNOSIS — R7309 Other abnormal glucose: Secondary | ICD-10-CM | POA: Diagnosis not present

## 2013-05-15 ENCOUNTER — Telehealth: Payer: Self-pay | Admitting: *Deleted

## 2013-05-15 DIAGNOSIS — R350 Frequency of micturition: Secondary | ICD-10-CM | POA: Diagnosis not present

## 2013-05-15 DIAGNOSIS — N419 Inflammatory disease of prostate, unspecified: Secondary | ICD-10-CM | POA: Diagnosis not present

## 2013-05-15 NOTE — Telephone Encounter (Signed)
Spoke with pt about PSA results.  Last PSA on file for pt was from 09/2012 labs.  Advised him of result number and reference range.

## 2013-05-23 DIAGNOSIS — L570 Actinic keratosis: Secondary | ICD-10-CM | POA: Diagnosis not present

## 2013-06-03 DIAGNOSIS — M7710 Lateral epicondylitis, unspecified elbow: Secondary | ICD-10-CM | POA: Diagnosis not present

## 2013-06-05 DIAGNOSIS — M19029 Primary osteoarthritis, unspecified elbow: Secondary | ICD-10-CM | POA: Diagnosis not present

## 2013-06-20 DIAGNOSIS — M6530 Trigger finger, unspecified finger: Secondary | ICD-10-CM | POA: Diagnosis not present

## 2013-06-21 ENCOUNTER — Other Ambulatory Visit: Payer: Self-pay | Admitting: Orthopedic Surgery

## 2013-06-21 DIAGNOSIS — M47817 Spondylosis without myelopathy or radiculopathy, lumbosacral region: Secondary | ICD-10-CM | POA: Diagnosis not present

## 2013-06-25 ENCOUNTER — Other Ambulatory Visit: Payer: Self-pay

## 2013-06-25 DIAGNOSIS — L570 Actinic keratosis: Secondary | ICD-10-CM | POA: Diagnosis not present

## 2013-06-25 NOTE — Telephone Encounter (Signed)
Crestor 40 refill faxed to [facility] fax# [phone]

## 2013-06-26 ENCOUNTER — Other Ambulatory Visit: Payer: Self-pay | Admitting: Gastroenterology

## 2013-07-01 ENCOUNTER — Ambulatory Visit: Payer: Self-pay | Admitting: Emergency Medicine

## 2013-07-02 ENCOUNTER — Encounter: Payer: Self-pay | Admitting: Internal Medicine

## 2013-07-03 ENCOUNTER — Ambulatory Visit (INDEPENDENT_AMBULATORY_CARE_PROVIDER_SITE_OTHER): Payer: Medicare Other | Admitting: Emergency Medicine

## 2013-07-03 ENCOUNTER — Encounter: Payer: Self-pay | Admitting: Emergency Medicine

## 2013-07-03 VITALS — BP 122/70 | HR 60 | Temp 98.2°F | Resp 18 | Ht 72.0 in | Wt 237.0 lb

## 2013-07-03 DIAGNOSIS — R7309 Other abnormal glucose: Secondary | ICD-10-CM | POA: Diagnosis not present

## 2013-07-03 DIAGNOSIS — E782 Mixed hyperlipidemia: Secondary | ICD-10-CM | POA: Diagnosis not present

## 2013-07-03 DIAGNOSIS — I10 Essential (primary) hypertension: Secondary | ICD-10-CM | POA: Diagnosis not present

## 2013-07-03 DIAGNOSIS — E559 Vitamin D deficiency, unspecified: Secondary | ICD-10-CM

## 2013-07-03 DIAGNOSIS — N400 Enlarged prostate without lower urinary tract symptoms: Secondary | ICD-10-CM

## 2013-07-03 DIAGNOSIS — R7303 Prediabetes: Secondary | ICD-10-CM

## 2013-07-03 DIAGNOSIS — K589 Irritable bowel syndrome without diarrhea: Secondary | ICD-10-CM

## 2013-07-03 DIAGNOSIS — J029 Acute pharyngitis, unspecified: Secondary | ICD-10-CM

## 2013-07-03 DIAGNOSIS — E785 Hyperlipidemia, unspecified: Secondary | ICD-10-CM

## 2013-07-03 DIAGNOSIS — J309 Allergic rhinitis, unspecified: Secondary | ICD-10-CM

## 2013-07-03 LAB — HEPATIC FUNCTION PANEL
ALT: 21 U/L (ref 0–53)
Indirect Bilirubin: 0.7 mg/dL (ref 0.0–0.9)
Total Protein: 6.6 g/dL (ref 6.0–8.3)

## 2013-07-03 LAB — HEMOGLOBIN A1C: Mean Plasma Glucose: 108 mg/dL (ref <117)

## 2013-07-03 LAB — CBC WITH DIFFERENTIAL/PLATELET
Basophils Absolute: 0.1 10*3/uL (ref 0.0–0.1)
Basophils Relative: 1 % (ref 0–1)
Eosinophils Absolute: 0.2 10*3/uL (ref 0.0–0.7)
HCT: 46 % (ref 39.0–52.0)
Hemoglobin: 15.6 g/dL (ref 13.0–17.0)
MCH: 31.6 pg (ref 26.0–34.0)
MCHC: 33.9 g/dL (ref 30.0–36.0)
Monocytes Absolute: 0.7 10*3/uL (ref 0.1–1.0)
Monocytes Relative: 8 % (ref 3–12)
Neutro Abs: 7 10*3/uL (ref 1.7–7.7)
RDW: 13.4 % (ref 11.5–15.5)

## 2013-07-03 LAB — LIPID PANEL
Cholesterol: 157 mg/dL (ref 0–200)
LDL Cholesterol: 62 mg/dL (ref 0–99)
Triglycerides: 254 mg/dL — ABNORMAL HIGH (ref <150)

## 2013-07-03 LAB — BASIC METABOLIC PANEL WITH GFR
CO2: 25 mEq/L (ref 19–32)
Chloride: 106 mEq/L (ref 96–112)
Creat: 0.81 mg/dL (ref 0.50–1.35)

## 2013-07-03 MED ORDER — AZITHROMYCIN 250 MG PO TABS: ORAL_TABLET | ORAL | Status: AC

## 2013-07-03 MED ORDER — PREDNISONE 10 MG PO TABS: ORAL_TABLET | ORAL | Status: DC

## 2013-07-03 MED ORDER — BENZONATATE 100 MG PO CAPS: 100.0000 mg | ORAL_CAPSULE | Freq: Three times a day (TID) | ORAL | Status: DC | PRN

## 2013-07-03 NOTE — Patient Instructions (Signed)
Viral and Bacterial Pharyngitis Pharyngitis is a sore throat. It is an infection of the back of the throat (pharynx). HOME CARE   Only take medicine as told by your doctor. You may get sick again if you do not take medicine as told.  Drink enough fluids to keep your pee (urine) clear or pale yellow.  Rest.  Rinse your mouth (gargle) with salt water ( teaspoon of salt in 8 ounces of water) every 1 to 2 hours. This will help the pain.  For children over the age of 7, suck on hard candy or sore throat lozenges. GET HELP RIGHT AWAY IF:   There are large, tender lumps in your neck.  You have a rash.  You cough up green, yellow-brown, or bloody mucus.  You have a stiff neck.  There is redness, puffiness (swelling), or very bad pain anywhere on the neck.  You drool or are unable to swallow liquids.  You throw up (vomit) or are not able to keep medicine or liquids down.  You have very bad pain that will not stop with medicine.  You have problems breathing (not from a stuffy nose).  You cannot open your mouth completely.  You or your child has a temperature by mouth above 102 F (38.9 C), not controlled by medicine.  Your baby is older than 3 months with a rectal temperature of 102 F (38.9 C) or higher.  Your baby is 3 months old or younger with a rectal temperature of 100.4 F (38 C) or higher. MAKE SURE YOU:   Understand these instructions.  Will watch this condition.  Will get help right away if you or your child is not doing well or gets worse. Document Released: 12/14/2007 Document Revised: 09/19/2011 Document Reviewed: 07/27/2009 Exit[employer] Patient Information 2014 [employer]. Allergic Rhinitis Allergic rhinitis is when the mucous membranes in the nose respond to allergens. Allergens are particles in the air that cause your body to have an allergic reaction. This causes you to release allergic antibodies. Through a chain of events, these eventually cause you to  release histamine into the blood stream (hence the use of antihistamines). Although meant to be protective to the body, it is this release that causes your discomfort, such as frequent sneezing, congestion and an itchy runny nose.  CAUSES  The pollen allergens may come from grasses, trees, and weeds. This is seasonal allergic rhinitis, or "hay fever." Other allergens cause year-round allergic rhinitis (perennial allergic rhinitis) such as house dust mite allergen, pet dander and mold spores.  SYMPTOMS   Nasal stuffiness (congestion).  Runny, itchy nose with sneezing and tearing of the eyes.  There is often an itching of the mouth, eyes and ears. It cannot be cured, but it can be controlled with medications. DIAGNOSIS  If you are unable to determine the offending allergen, skin or blood testing may find it. TREATMENT   Avoid the allergen.  Medications and allergy shots (immunotherapy) can help.  Hay fever may often be treated with antihistamines in pill or nasal spray forms. Antihistamines block the effects of histamine. There are over-the-counter medicines that may help with nasal congestion and swelling around the eyes. Check with your caregiver before taking or giving this medicine. If the treatment above does not work, there are many new medications your caregiver can prescribe. Stronger medications may be used if initial measures are ineffective. Desensitizing injections can be used if medications and avoidance fails. Desensitization is when a patient is given ongoing shots until   the body becomes less sensitive to the allergen. Make sure you follow up with your caregiver if problems continue. SEEK MEDICAL CARE IF:   You develop fever (more than 100.5 F (38.1 C).  You develop a cough that does not stop easily (persistent).  You have shortness of breath.  You start wheezing.  Symptoms interfere with normal daily activities. Document Released: 03/22/2001 Document Revised:  09/19/2011 Document Reviewed: 10/01/2008 [employer] Patient Information 2014 [employer].  

## 2013-07-03 NOTE — Progress Notes (Signed)
Subjective:    Patient ID: [name], male    DOB: 08-Mar-1946, 68 y.o.   MRN: [id number]  HPI Comments: 67 yo male presents for 3 month F/U for HTN, Cholesterol, Pre-Dm, D. Deficient. He has been eating healthy except for holidays. He has been keeping active. His BP has been good at home. LAST LABS T 153 TG 100 H 44 LDL 89 A1C 5.5 D 74  He notes he has has cold symptoms x 1 day with fever cough, sore throat and occasional color to production.  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  He will have left elbow and rt triggor finger surgery done in January and may need surgical clearance. He has had no problems with surgery in the past 19 or so procedures. No sudden deaths in family, no prior problems with surgery   He had recent DX and is being treated with Cipro for prostate infection x 3 days Macderment. He notes + improvement with symptoms with antibiotics.  Hypertension  Hyperlipidemia  Sore Throat  Associated symptoms include congestion and coughing.     Current Outpatient Prescriptions on File Prior to Visit  Medication Sig Dispense Refill  . alfuzosin (UROXATRAL) 10 MG 24 hr tablet Take 10 mg by mouth daily.      Marland Kitchen amitriptyline (ELAVIL) 25 MG tablet Take 25 mg by mouth at bedtime.      Marland Kitchen aspirin 81 MG tablet Take 81 mg by mouth daily.       Marland Kitchen atenolol (TENORMIN) 25 MG tablet Take 25 mg by mouth every evening.       . Cholecalciferol (VITAMIN D-3) 5000 UNITS TABS Take 10,000 Units by mouth daily.      . enalapril (VASOTEC) 10 MG tablet Take 10 mg by mouth daily.      . finasteride (PROSCAR) 5 MG tablet Take 5 mg by mouth daily.      . hyoscyamine (LEVSIN SL) 0.125 MG SL tablet PLACE 2 TABLETS UNDER THE TONGUE EVERY 4 HOURS AS NEEDED FOR CRAMPING.  30 tablet  2  . Multiple Vitamin (MULTIVITAMIN) tablet Take 1 tablet by mouth daily.      . rosuvastatin (CRESTOR) 40 MG tablet Take 20 mg by mouth daily.       .  tadalafil (CIALIS) 5 MG tablet Take 5 mg by mouth daily as needed for erectile dysfunction.      . vitamin C (ASCORBIC ACID) 500 MG tablet Take 500 mg by mouth daily.       No current facility-administered medications on file prior to visit.   ALLERGIES Clonazepam; Flomax; and Lipitor  Past Medical History  Diagnosis Date  . Hypertension   . Hyperlipidemia   . BPH (benign prostatic hypertrophy)   . Pelvic pain in male   . Frequency of urination   . Urgency of urination   . Nocturia   . Arthritis   . Mild acid reflux occasional  . Pre-diabetes   . IBS (irritable bowel syndrome)   . Vitamin D deficiency   . Obesity     Review of Systems  HENT: Positive for congestion, postnasal drip and sore throat.   Respiratory: Positive for cough.   Musculoskeletal: Positive for joint swelling.  All other systems reviewed and are negative.   BP 122/70  Pulse 60  Temp(Src) 98.2 F (36.8 C) (Temporal)  Resp 18  Ht 6' (1.829 m)  Wt 237 lb (107.502 kg)  BMI 32.14 kg/m2     Objective:   Physical Exam  Nursing note and vitals reviewed. Constitutional: He is oriented to person, place, and time. He appears well-developed and well-nourished.  HENT:  Head: Normocephalic and atraumatic.  Right Ear: External ear normal.  Left Ear: External ear normal.  Nose: Nose normal.  Mouth/Throat: No oropharyngeal exudate.  Mild post pharynx erythema  Eyes: Conjunctivae and EOM are normal.  Neck: Normal range of motion. Neck supple. No JVD present. No thyromegaly present.  Cardiovascular: Normal rate, regular rhythm, normal heart sounds and intact distal pulses.   Pulmonary/Chest: Effort normal and breath sounds normal.  Abdominal: Soft. Bowel sounds are normal. He exhibits no distension and no mass. There is no tenderness. There is no rebound and no guarding.  Musculoskeletal: He exhibits no edema and no tenderness.  Decreased flexion of right finger  Lymphadenopathy:    He has no cervical  adenopathy.  Neurological: He is alert and oriented to person, place, and time. He has normal reflexes. No cranial nerve deficit. Coordination normal.  Skin: Skin is warm and dry.  Psychiatric: He has a normal mood and affect. His behavior is normal. Judgment and thought content normal.          Assessment & Plan:  1.  3 month F/U for HTN, Cholesterol, Pre-Dm, D. Deficient. Needs healthy diet, cardio QD and obtain healthy weight. Check Labs, Check BP if >130/80 call office 2. ST/ Allergic rhinitis- Allegra OTC, increase H2o, allergy hygiene explained. Pred 10 mg DP AD, If sx increase Zpak AD.

## 2013-07-04 LAB — INSULIN, FASTING: Insulin fasting, serum: 22 u[IU]/mL (ref 3–28)

## 2013-07-06 DIAGNOSIS — I10 Essential (primary) hypertension: Secondary | ICD-10-CM | POA: Insufficient documentation

## 2013-07-06 DIAGNOSIS — K589 Irritable bowel syndrome without diarrhea: Secondary | ICD-10-CM | POA: Insufficient documentation

## 2013-07-06 DIAGNOSIS — E559 Vitamin D deficiency, unspecified: Secondary | ICD-10-CM | POA: Insufficient documentation

## 2013-07-06 DIAGNOSIS — E782 Mixed hyperlipidemia: Secondary | ICD-10-CM | POA: Insufficient documentation

## 2013-07-06 DIAGNOSIS — N138 Other obstructive and reflux uropathy: Secondary | ICD-10-CM | POA: Insufficient documentation

## 2013-07-06 DIAGNOSIS — R7309 Other abnormal glucose: Secondary | ICD-10-CM | POA: Insufficient documentation

## 2013-07-09 DIAGNOSIS — L821 Other seborrheic keratosis: Secondary | ICD-10-CM | POA: Diagnosis not present

## 2013-07-09 DIAGNOSIS — D1801 Hemangioma of skin and subcutaneous tissue: Secondary | ICD-10-CM | POA: Diagnosis not present

## 2013-07-09 DIAGNOSIS — B356 Tinea cruris: Secondary | ICD-10-CM | POA: Diagnosis not present

## 2013-07-09 DIAGNOSIS — Z85828 Personal history of other malignant neoplasm of skin: Secondary | ICD-10-CM | POA: Diagnosis not present

## 2013-07-10 ENCOUNTER — Encounter (HOSPITAL_BASED_OUTPATIENT_CLINIC_OR_DEPARTMENT_OTHER): Payer: Self-pay | Admitting: *Deleted

## 2013-07-10 NOTE — Progress Notes (Signed)
Pt had labs 07/03/13-cbc bmet-had ekg pcp 3/14-called

## 2013-07-13 ENCOUNTER — Other Ambulatory Visit: Payer: Self-pay | Admitting: Internal Medicine

## 2013-07-17 ENCOUNTER — Encounter (HOSPITAL_BASED_OUTPATIENT_CLINIC_OR_DEPARTMENT_OTHER): Payer: Medicare Other | Admitting: Certified Registered"

## 2013-07-17 ENCOUNTER — Encounter (HOSPITAL_BASED_OUTPATIENT_CLINIC_OR_DEPARTMENT_OTHER): Payer: Self-pay | Admitting: Certified Registered"

## 2013-07-17 ENCOUNTER — Ambulatory Visit (HOSPITAL_BASED_OUTPATIENT_CLINIC_OR_DEPARTMENT_OTHER)
Admission: RE | Admit: 2013-07-17 | Discharge: 2013-07-17 | Disposition: A | Discharge status: Home / Self Care | Payer: Medicare Other | Source: Ambulatory Visit | Attending: Orthopedic Surgery | Admitting: Orthopedic Surgery

## 2013-07-17 ENCOUNTER — Ambulatory Visit (HOSPITAL_BASED_OUTPATIENT_CLINIC_OR_DEPARTMENT_OTHER): Payer: Medicare Other | Admitting: Certified Registered"

## 2013-07-17 ENCOUNTER — Encounter (HOSPITAL_BASED_OUTPATIENT_CLINIC_OR_DEPARTMENT_OTHER)
Admission: RE | Disposition: A | Discharge status: Home / Self Care | Payer: Self-pay | Source: Ambulatory Visit | Attending: Orthopedic Surgery

## 2013-07-17 DIAGNOSIS — K219 Gastro-esophageal reflux disease without esophagitis: Secondary | ICD-10-CM | POA: Diagnosis not present

## 2013-07-17 DIAGNOSIS — M6530 Trigger finger, unspecified finger: Secondary | ICD-10-CM

## 2013-07-17 DIAGNOSIS — G8929 Other chronic pain: Secondary | ICD-10-CM | POA: Insufficient documentation

## 2013-07-17 DIAGNOSIS — M65849 Other synovitis and tenosynovitis, unspecified hand: Principal | ICD-10-CM

## 2013-07-17 DIAGNOSIS — M249 Joint derangement, unspecified: Secondary | ICD-10-CM | POA: Diagnosis not present

## 2013-07-17 DIAGNOSIS — M65839 Other synovitis and tenosynovitis, unspecified forearm: Secondary | ICD-10-CM | POA: Insufficient documentation

## 2013-07-17 DIAGNOSIS — M7710 Lateral epicondylitis, unspecified elbow: Secondary | ICD-10-CM | POA: Diagnosis not present

## 2013-07-17 DIAGNOSIS — I10 Essential (primary) hypertension: Secondary | ICD-10-CM | POA: Diagnosis not present

## 2013-07-17 HISTORY — PX: TRIGGER FINGER RELEASE: SHX641

## 2013-07-17 HISTORY — PX: TENDON RECONSTRUCTION: SHX2487

## 2013-07-17 HISTORY — DX: Presence of spectacles and contact lenses: Z97.3

## 2013-07-17 LAB — POCT HEMOGLOBIN-HEMACUE: Hemoglobin: 15 g/dL (ref 13.0–17.0)

## 2013-07-17 SURGERY — RELEASE, A1 PULLEY, FOR TRIGGER FINGER
Anesthesia: General | Site: Finger | Laterality: Right

## 2013-07-17 MED ORDER — EPHEDRINE SULFATE 50 MG/ML IJ SOLN
INTRAMUSCULAR | Status: DC | PRN
  Administered 2013-07-17: 10 mg via INTRAVENOUS

## 2013-07-17 MED ORDER — OXYCODONE-ACETAMINOPHEN 5-325 MG PO TABS: 1.0000 | ORAL_TABLET | ORAL | Status: DC | PRN

## 2013-07-17 MED ORDER — MIDAZOLAM HCL 2 MG/2ML IJ SOLN
INTRAMUSCULAR | Status: AC
  Filled 2013-07-17: qty 2

## 2013-07-17 MED ORDER — CHLORHEXIDINE GLUCONATE 4 % EX LIQD: 60.0000 mL | Freq: Once | CUTANEOUS | Status: DC

## 2013-07-17 MED ORDER — LIDOCAINE-EPINEPHRINE (PF) 1 %-1:200000 IJ SOLN
INTRAMUSCULAR | Status: DC | PRN
  Administered 2013-07-17: 10 mL

## 2013-07-17 MED ORDER — FENTANYL CITRATE 0.05 MG/ML IJ SOLN
INTRAMUSCULAR | Status: AC
  Filled 2013-07-17: qty 2

## 2013-07-17 MED ORDER — BUPIVACAINE HCL (PF) 0.25 % IJ SOLN
INTRAMUSCULAR | Status: AC
  Filled 2013-07-17: qty 30

## 2013-07-17 MED ORDER — LIDOCAINE HCL 1 % IJ SOLN
INTRAMUSCULAR | Status: DC | PRN
  Administered 2013-07-17: 2 mL via INTRADERMAL

## 2013-07-17 MED ORDER — ROPIVACAINE HCL 5 MG/ML IJ SOLN
INTRAMUSCULAR | Status: DC | PRN
  Administered 2013-07-17: 35 mL via PERINEURAL

## 2013-07-17 MED ORDER — FENTANYL CITRATE 0.05 MG/ML IJ SOLN
INTRAMUSCULAR | Status: AC
  Filled 2013-07-17: qty 6

## 2013-07-17 MED ORDER — OXYCODONE HCL 5 MG PO TABS: 5.0000 mg | ORAL_TABLET | Freq: Once | ORAL | Status: DC | PRN

## 2013-07-17 MED ORDER — HYDROMORPHONE HCL PF 1 MG/ML IJ SOLN: 0.2500 mg | INTRAMUSCULAR | Status: DC | PRN

## 2013-07-17 MED ORDER — LACTATED RINGERS IV SOLN
INTRAVENOUS | Status: DC
  Administered 2013-07-17 (×2): via INTRAVENOUS

## 2013-07-17 MED ORDER — LIDOCAINE HCL (CARDIAC) 20 MG/ML IV SOLN
INTRAVENOUS | Status: DC | PRN
  Administered 2013-07-17: 20 mg via INTRAVENOUS

## 2013-07-17 MED ORDER — PROPOFOL 10 MG/ML IV BOLUS
INTRAVENOUS | Status: DC | PRN
  Administered 2013-07-17: 200 mg via INTRAVENOUS

## 2013-07-17 MED ORDER — OXYCODONE HCL 5 MG/5ML PO SOLN: 5.0000 mg | Freq: Once | ORAL | Status: DC | PRN

## 2013-07-17 MED ORDER — METOCLOPRAMIDE HCL 5 MG/ML IJ SOLN: 10.0000 mg | Freq: Once | INTRAMUSCULAR | Status: DC | PRN

## 2013-07-17 MED ORDER — DEXAMETHASONE SODIUM PHOSPHATE 10 MG/ML IJ SOLN
INTRAMUSCULAR | Status: DC | PRN
  Administered 2013-07-17: 10 mg via INTRAVENOUS

## 2013-07-17 MED ORDER — CEFAZOLIN SODIUM-DEXTROSE 2-3 GM-% IV SOLR
2.0000 g | INTRAVENOUS | Status: AC
  Administered 2013-07-17: 2 g via INTRAVENOUS

## 2013-07-17 MED ORDER — MIDAZOLAM HCL 2 MG/2ML IJ SOLN
1.0000 mg | INTRAMUSCULAR | Status: DC | PRN
  Administered 2013-07-17: 2 mg via INTRAVENOUS

## 2013-07-17 MED ORDER — LIDOCAINE-EPINEPHRINE (PF) 1 %-1:200000 IJ SOLN
INTRAMUSCULAR | Status: AC
  Filled 2013-07-17: qty 10

## 2013-07-17 MED ORDER — ONDANSETRON HCL 4 MG/2ML IJ SOLN
INTRAMUSCULAR | Status: DC | PRN
  Administered 2013-07-17: 4 mg via INTRAVENOUS

## 2013-07-17 MED ORDER — FENTANYL CITRATE 0.05 MG/ML IJ SOLN
50.0000 ug | INTRAMUSCULAR | Status: DC | PRN
  Administered 2013-07-17: 100 ug via INTRAVENOUS

## 2013-07-17 SURGICAL SUPPLY — 81 items
ANCHOR CORKSCREW BIO 4.5 (Anchor) ×2 IMPLANT
APL SKNCLS STERI-STRIP NONHPOA (GAUZE/BANDAGES/DRESSINGS)
BANDAGE COBAN STERILE 2 (GAUZE/BANDAGES/DRESSINGS) ×1 IMPLANT
BANDAGE ELASTIC 3 VELCRO ST LF (GAUZE/BANDAGES/DRESSINGS) ×3 IMPLANT
BANDAGE ELASTIC 4 VELCRO ST LF (GAUZE/BANDAGES/DRESSINGS) ×3 IMPLANT
BENZOIN TINCTURE PRP APPL 2/3 (GAUZE/BANDAGES/DRESSINGS) ×1 IMPLANT
BLADE MINI RND TIP GREEN BEAV (BLADE) IMPLANT
BLADE SURG 10 STRL SS (BLADE) IMPLANT
BLADE SURG 15 STRL LF DISP TIS (BLADE) ×3 IMPLANT
BLADE SURG 15 STRL SS (BLADE) ×6
BNDG CMPR 9X4 STRL LF SNTH (GAUZE/BANDAGES/DRESSINGS) ×2
BNDG COHESIVE 1X5 TAN STRL LF (GAUZE/BANDAGES/DRESSINGS) ×2 IMPLANT
BNDG ESMARK 4X9 LF (GAUZE/BANDAGES/DRESSINGS) ×3 IMPLANT
BNDG GAUZE ELAST 4 BULKY (GAUZE/BANDAGES/DRESSINGS) ×3 IMPLANT
CANISTER SUCT 1200ML W/VALVE (MISCELLANEOUS) IMPLANT
CORDS BIPOLAR (ELECTRODE) ×6 IMPLANT
COVER TABLE BACK 60X90 (DRAPES) ×3 IMPLANT
CUFF TOURNIQUET SINGLE 18IN (TOURNIQUET CUFF) IMPLANT
DECANTER SPIKE VIAL GLASS SM (MISCELLANEOUS) IMPLANT
DRAPE EXTREMITY T 121X128X90 (DRAPE) ×4 IMPLANT
DRAPE OEC MINIVIEW 54X84 (DRAPES) IMPLANT
DRAPE SURG 17X23 STRL (DRAPES) ×3 IMPLANT
DURAPREP 26ML APPLICATOR (WOUND CARE) ×3 IMPLANT
GAUZE SPONGE 4X4 16PLY XRAY LF (GAUZE/BANDAGES/DRESSINGS) ×3 IMPLANT
GAUZE XEROFORM 1X8 LF (GAUZE/BANDAGES/DRESSINGS) ×2 IMPLANT
GLOVE BIO SURGEON STRL SZ 6.5 (GLOVE) ×4 IMPLANT
GLOVE BIO SURGEON STRL SZ8 (GLOVE) ×2 IMPLANT
GLOVE BIOGEL PI IND STRL 7.0 (GLOVE) IMPLANT
GLOVE BIOGEL PI IND STRL 8.5 (GLOVE) ×1 IMPLANT
GLOVE BIOGEL PI INDICATOR 7.0 (GLOVE) ×1
GLOVE BIOGEL PI INDICATOR 8.5 (GLOVE) ×1
GLOVE SURG ORTHO 8.0 STRL STRW (GLOVE) ×1 IMPLANT
GLOVE SURG SYN 8.0 (GLOVE) ×3 IMPLANT
GLOVE SURG SYN 8.0 PF PI (GLOVE) IMPLANT
GOWN STRL REUS W/ TWL LRG LVL3 (GOWN DISPOSABLE) ×2 IMPLANT
GOWN STRL REUS W/TWL LRG LVL3 (GOWN DISPOSABLE) ×6
GOWN STRL REUS W/TWL XL LVL3 (GOWN DISPOSABLE) ×4 IMPLANT
K-WIRE .035X4 (WIRE) ×1 IMPLANT
K-WIRE .062X4 (WIRE) ×1 IMPLANT
LOOP VESSEL MAXI BLUE (MISCELLANEOUS) IMPLANT
NDL HYPO 25X1 1.5 SAFETY (NEEDLE) ×2 IMPLANT
NDL SUT 6 .5 CRC .975X.05 MAYO (NEEDLE) IMPLANT
NEEDLE HYPO 25X1 1.5 SAFETY (NEEDLE) ×3 IMPLANT
NEEDLE MAYO TAPER (NEEDLE) ×3
NS IRRIG 1000ML POUR BTL (IV SOLUTION) ×3 IMPLANT
PACK BASIN DAY SURGERY FS (CUSTOM PROCEDURE TRAY) ×3 IMPLANT
PAD ABD 8X10 STRL (GAUZE/BANDAGES/DRESSINGS) ×3 IMPLANT
PAD CAST 3X4 CTTN HI CHSV (CAST SUPPLIES) ×2 IMPLANT
PAD CAST 4YDX4 CTTN HI CHSV (CAST SUPPLIES) ×2 IMPLANT
PADDING CAST ABS 4INX4YD NS (CAST SUPPLIES) ×1
PADDING CAST ABS COTTON 4X4 ST (CAST SUPPLIES) ×2 IMPLANT
PADDING CAST COTTON 3X4 STRL (CAST SUPPLIES) ×3
PADDING CAST COTTON 4X4 STRL (CAST SUPPLIES) ×3
PASSER SUT SWANSON 36MM LOOP (INSTRUMENTS) ×1 IMPLANT
SHEET MEDIUM DRAPE 40X70 STRL (DRAPES) ×4 IMPLANT
SLING ARM FOAM STRAP LRG (SOFTGOODS) IMPLANT
SLING ARM FOAM STRAP MED (SOFTGOODS) IMPLANT
SLING ARM FOAM STRAP XLG (SOFTGOODS) ×1 IMPLANT
SPLINT FAST PLASTER 5X30 (CAST SUPPLIES) ×10
SPLINT PLASTER CAST FAST 5X30 (CAST SUPPLIES) ×20 IMPLANT
SPONGE GAUZE 4X4 12PLY (GAUZE/BANDAGES/DRESSINGS) ×3 IMPLANT
SPONGE GAUZE 4X4 12PLY STER LF (GAUZE/BANDAGES/DRESSINGS) ×6 IMPLANT
STOCKINETTE 4X48 STRL (DRAPES) ×4 IMPLANT
STRIP CLOSURE SKIN 1/2X4 (GAUZE/BANDAGES/DRESSINGS) ×1 IMPLANT
SUCTION FRAZIER TIP 10 FR DISP (SUCTIONS) IMPLANT
SUT ETHIBOND 2 OS 4 DA (SUTURE) IMPLANT
SUT ETHILON 4 0 PS 2 18 (SUTURE) IMPLANT
SUT ETHILON 5 0 PS 2 18 (SUTURE) IMPLANT
SUT PROLENE 3 0 PS 2 (SUTURE) ×2 IMPLANT
SUT VIC AB 0 SH 27 (SUTURE) ×3 IMPLANT
SUT VIC AB 2-0 SH 27 (SUTURE)
SUT VIC AB 2-0 SH 27XBRD (SUTURE) IMPLANT
SUT VIC AB 3-0 X1 27 (SUTURE) IMPLANT
SUT VIC AB 4-0 P-3 18XBRD (SUTURE) IMPLANT
SUT VIC AB 4-0 P3 18 (SUTURE)
SUT VICRYL RAPIDE 4/0 PS 2 (SUTURE) ×2 IMPLANT
SYR BULB 3OZ (MISCELLANEOUS) ×3 IMPLANT
SYRINGE 10CC LL (SYRINGE) ×3 IMPLANT
TOWEL OR 17X24 6PK STRL BLUE (TOWEL DISPOSABLE) ×3 IMPLANT
TUBE CONNECTING 20X1/4 (TUBING) IMPLANT
UNDERPAD 30X30 INCONTINENT (UNDERPADS AND DIAPERS) ×3 IMPLANT

## 2013-07-17 NOTE — Anesthesia Procedure Notes (Signed)
Procedure Name: LMA Insertion Date/Time: 07/17/2013 10:24 AM Performed by: Treysean Petruzzi Pre-anesthesia Checklist: Patient identified, Emergency Drugs available, Suction available and Patient being monitored Patient Re-evaluated:Patient Re-evaluated prior to inductionOxygen Delivery Method: Circle System Utilized Preoxygenation: Pre-oxygenation with 100% oxygen Intubation Type: IV induction Ventilation: Mask ventilation without difficulty LMA: LMA inserted LMA Size: 5.0 Number of attempts: 1 Airway Equipment and Method: bite block Placement Confirmation: positive ETCO2 Tube secured with: Tape Dental Injury: Teeth and Oropharynx as per pre-operative assessment

## 2013-07-17 NOTE — Anesthesia Preprocedure Evaluation (Signed)
Anesthesia Evaluation  Patient identified by MRN, date of birth, ID band Patient awake    Reviewed: Allergy & Precautions, H&P , NPO status , Patient's Chart, lab work & pertinent test results, reviewed documented beta blocker date and time   Airway Mallampati: II TM Distance: >3 FB Neck ROM: full    Dental   Pulmonary neg pulmonary ROS,  breath sounds clear to auscultation        Cardiovascular hypertension, On Medications and On Home Beta Blockers Rhythm:regular     Neuro/Psych negative neurological ROS  negative psych ROS   GI/Hepatic Neg liver ROS, GERD-  Medicated and Controlled,  Endo/Other  negative endocrine ROS  Renal/GU negative Renal ROS Bladder dysfunction      Musculoskeletal   Abdominal   Peds  Hematology negative hematology ROS (+)   Anesthesia Other Findings See surgeon's H&P   Reproductive/Obstetrics negative OB ROS                           Anesthesia Physical Anesthesia Plan  ASA: II  Anesthesia Plan: General   Post-op Pain Management:    Induction: Intravenous  Airway Management Planned: LMA  Additional Equipment:   Intra-op Plan:   Post-operative Plan:   Informed Consent: I have reviewed the patients History and Physical, chart, labs and discussed the procedure including the risks, benefits and alternatives for the proposed anesthesia with the patient or authorized representative who has indicated his/her understanding and acceptance.   Dental Advisory Given  Plan Discussed with: CRNA and Surgeon  Anesthesia Plan Comments:         Anesthesia Quick Evaluation

## 2013-07-17 NOTE — Op Note (Signed)
See note [phone]

## 2013-07-17 NOTE — Transfer of Care (Signed)
Immediate Anesthesia Transfer of Care Note  Patient: [name]  Procedure(s) Performed: Procedure(s): RELEASE A-1 PULLEY RIGHT RING FINGER (Right) LEFT ELBOW LATERAL RECONSTRUCTION (Left)  Patient Location: PACU  Anesthesia Type:GA combined with regional for post-op pain  Level of Consciousness: awake and patient cooperative  Airway & Oxygen Therapy: Patient Spontanous Breathing and Patient connected to face mask oxygen  Post-op Assessment: Report given to PACU RN and Post -op Vital signs reviewed and stable  Post vital signs: Reviewed and stable  Complications: No apparent anesthesia complications

## 2013-07-17 NOTE — Anesthesia Postprocedure Evaluation (Signed)
Anesthesia Post Note  Patient: [name]  Procedure(s) Performed: Procedure(s) (LRB): RELEASE A-1 PULLEY RIGHT RING FINGER (Right) LEFT ELBOW LATERAL RECONSTRUCTION (Left)  Anesthesia type: General  Patient location: PACU  Post pain: Pain level controlled  Post assessment: Patient's Cardiovascular Status Stable  Last Vitals:  Filed Vitals:   07/17/13 1300  BP: 182/88  Pulse: 69  Temp:   Resp: 14    Post vital signs: Reviewed and stable  Level of consciousness: alert  Complications: No apparent anesthesia complications

## 2013-07-17 NOTE — Progress Notes (Signed)
Assisted Dr. Frederick with left, ultrasound guided, supraclavicular block. Side rails up, monitors on throughout procedure. See vital signs in flow sheet. Tolerated Procedure well. 

## 2013-07-17 NOTE — Discharge Instructions (Signed)
°Post Anesthesia Home Care Instructions ° °Activity: °Get plenty of rest for the remainder of the day. A responsible adult should stay with you for 24 hours following the procedure.  °For the next 24 hours, DO NOT: °-Drive a car °-Operate machinery °-Drink alcoholic beverages °-Take any medication unless instructed by your physician °-Make any legal decisions or sign important papers. ° °Meals: °Start with liquid foods such as gelatin or soup. Progress to regular foods as tolerated. Avoid greasy, spicy, heavy foods. If nausea and/or vomiting occur, drink only clear liquids until the nausea and/or vomiting subsides. Call your physician if vomiting continues. ° °Special Instructions/Symptoms: °Your throat may feel dry or sore from the anesthesia or the breathing tube placed in your throat during surgery. If this causes discomfort, gargle with warm salt water. The discomfort should disappear within 24 hours. ° ° ° ° ° °Regional Anesthesia Blocks ° °1. Numbness or the inability to move the "blocked" extremity may last from 3-48 hours after placement. The length of time depends on the medication injected and your individual response to the medication. If the numbness is not going away after 48 hours, call your surgeon. ° °2. The extremity that is blocked will need to be protected until the numbness is gone and the  Strength has returned. Because you cannot feel it, you will need to take extra care to avoid injury. Because it may be weak, you may have difficulty moving it or using it. You may not know what position it is in without looking at it while the block is in effect. ° °3. For blocks in the legs and feet, returning to weight bearing and walking needs to be done carefully. You will need to wait until the numbness is entirely gone and the strength has returned. You should be able to move your leg and foot normally before you try and bear weight or walk. You will need someone to be with you when you first try to  ensure you do not fall and possibly risk injury. ° °4. Bruising and tenderness at the needle site are common side effects and will resolve in a few days. ° °5. Persistent numbness or new problems with movement should be communicated to the surgeon or the [facility] ([phone])/ [facility] ([phone]). ° ° ° ° ° °      HAND SURGERY ° °  HOME CARE INSTRUCTIONS ° ° ° °The following instructions have been prepared to help you care for yourself upon your return home today. ° °Wound Care:  °Keep your hand elevated above the level of your heart. Do not allow it to dangle by your side. Keep the dressing dry and do not remove it unless your doctor advises you to do so. He will usually change it at the time of you post-op visit. Moving your fingers is advised to stimulate circulation but will depend on the site of your surgery. Of course, if you have a splint applied your doctor will advise you about movement. ° °Activity:  °Do not drive or operate machinery today. Rest today and then you may return to your normal activity and work as indicated by your physician. ° °Diet: °Drink liquids today or eat a light diet. You may resume a regular diet tomorrow. ° °General expectations: °Pain for two or three days. °Fingers may become slightly swollen.  ° °Unexpected Observations- Call your doctor if any of these occur: °Severe pain not relieved by pain medication. °Elevated temperature. °Dressing soaked   with blood. °Inability to move fingers. °White or bluish color to fingers. ° °Return to office: *** ° ° ° ° ° ° ° °

## 2013-07-17 NOTE — H&P (Signed)
[name] is an 67 y.o. male.   Chief Complaint: right ring finger triggering and chronic left elbow pain HPI: as above with chronic right ring finger STS and left elbow pain with MRI consistent with chronic epicondylitis  Past Medical History  Diagnosis Date  . Hypertension   . Hyperlipidemia   . BPH (benign prostatic hypertrophy)   . Pelvic pain in male   . Frequency of urination   . Urgency of urination   . Nocturia   . Arthritis   . Mild acid reflux occasional  . Pre-diabetes   . IBS (irritable bowel syndrome)   . Vitamin D deficiency   . Obesity   . Wears glasses     Past Surgical History  Procedure Laterality Date  . Appendectomy  02-07-2007  . Anterior cervical decomp/discectomy fusion  01-25-2002    C4 - C5  . Left inguinal hernia repair  1970  . Umbilical hernia repair  2003  . Left rotator cuff repair  2002  . Anterior cruciate ligament repair  1990    RIGHT  . Cervical fusion  2000    C5 - 6  . Left ankle surg.  1980  . Hip surgery  1999    LEFT HIP --  REMOVAL CALCIUM BUILD-UP  . Colonoscopy  05-30-2011    HEMORRHOIDS  . Tonsillectomy      Family History  Problem Relation Age of Onset  . Heart disease Mother   . Stroke Mother   . Heart disease Father   . Diabetes Father    Social History:  reports that he has never smoked. He has never used smokeless tobacco. He reports that he drinks alcohol. He reports that he does not use illicit drugs.  Allergies:  Allergies  Allergen Reactions  . Clonazepam Other (See Comments)    "mean"  . Flomax [Tamsulosin Hcl] Other (See Comments)    hypotension  . Lipitor [Atorvastatin] Other (See Comments)    Myalgias    No prescriptions prior to admission    No results found for this or any previous visit (from the past 53 hour(s)). No results found.  Review of Systems  All other systems reviewed and are negative.    Height 6' (1.829 m), weight 106.595 kg (235 lb). Physical Exam   Constitutional: He is oriented to person, place, and time. He appears well-developed and well-nourished.  HENT:  Head: Normocephalic and atraumatic.  Cardiovascular: Normal rate.   Respiratory: Effort normal.  Musculoskeletal:       Left elbow: He exhibits swelling. Tenderness found. Lateral epicondyle tenderness noted.       Right hand: He exhibits tenderness and swelling.  Chronic right ring STS and left elbow lateral epicondylitis  Neurological: He is alert and oriented to person, place, and time.  Skin: Skin is warm.  Psychiatric: He has a normal mood and affect. His behavior is normal. Judgment and thought content normal.     Assessment/Plan As above  Plan right ring A-1 pulley release and left elbow lateral reconsrtuction  Maribel Luis A 07/17/2013, 7:00 AM

## 2013-07-18 ENCOUNTER — Encounter (HOSPITAL_BASED_OUTPATIENT_CLINIC_OR_DEPARTMENT_OTHER): Payer: Self-pay | Admitting: Orthopedic Surgery

## 2013-07-19 NOTE — Op Note (Signed)
[name], KINDLE NO.:  [id number]  MEDICAL RECORD NO.:  [id number]  LOCATION:                               FACILITY:  [facility]  PHYSICIAN:  Sheral Apley. Elizet Kaplan, M.D.DATE OF BIRTH:  1946-06-23  DATE OF PROCEDURE:  07/17/2013 DATE OF DISCHARGE:  07/17/2013                              OPERATIVE REPORT   PREOPERATIVE DIAGNOSES:  Chronic right ring finger stenosing tenosynovitis and chronic left elbow pain with MRI positive ulnar collateral ligament rupture and extensor carpi radialis brevis tear.  POSTOPERATIVE DIAGNOSES:  Chronic right ring finger stenosing tenosynovitis and chronic left elbow pain with MRI positive ulnar collateral ligament rupture and extensor carpi radialis brevis tear.  PROCEDURE:  Left elbow lateral reconstruction with reattachment of two drill holes of the common extensor origin and collateral ligament, as well as debridement of the extensor carpi radialis brevis tendon insertion and then right ring finger A1 pulley release.  SURGEON:  Schuyler Amor, MD.  ASSISTANT:  Daryll Brod, MD.  ANESTHESIA:  Axillary block and general on the left, 1% lidocaine with epinephrine 10 mL field block by the surgeon on the right ring finger.  COMPLICATIONS:  No complications.  DRAINS:  No drains.  DESCRIPTION OF PROCEDURE:  The patient was taken to the operating suite. After induction of adequate axillary block analgesia and then laryngeal mask airway anesthesia, the left upper extremity was prepped and draped in sterile fashion.  An Esmarch was used to exsanguinate the limb. Tourniquet was inflated to 250 mmHg.  At this point in time, an incision was made along the lateral epicondyle area and common extensor origin area of the left elbow.  The skin incised sharply 5-6 cm.  Dissection was carried down to the tip of the epicondyle and the interval between the extensor carpi radialis longus and extensor digitorum comatose. This was  longitudinally split to the level of the epicondyles.  The extensor carpi radialis brevis tendon, which was completely ruptured off the epicondyles was debrided down to healthy looking tissue using a 15 blade.  We then subperiosteally elevated the common extensor origin EDC and ECRL off the epicondyle.  The epicondyle also had a complete rupture of the ulnar collateral ligament.  This was carefully identified under surface of the extensor carpi radialis longus tendon.  The joint was inspected.  There were no osseous or cartilaginous lesions identified. The wound was irrigated.  At this point in time, we repaired the lateral ulnar collateral ligament, and the EDC and ECRL insertion site through drill holes in the epicondyle using a 2-0 FiberWire suture x2.  We then used a 0 Vicryl to reinforce the repair and then closed the interval between the extensor carpi radialis longus and the extensor digitorum communis tendons.  The wound was irrigated.  Hemostasis was achieved with bipolar cautery, loosely closed with 4-0 Vicryl Rapide and horizontal mattress sutures.  Xeroform, 4x4s, and sugar-tong splint was applied.  The patient tolerated this procedure well.  We then broke down our sterile field and then re-prepped and draped the right side.  Prior to this being done, 10 mL of 1% lidocaine with epinephrine 1:100,000 was injected in the A1 pulley area of the  ring finger on the right.  After this was done, he was prepped and draped in the sterile fashion. Transverse incision was made at the A1 pulley.  Neurovascular bones were identified and retracted.  The A1 pulley was split the FDS and FDP tendons were lysed.  All adhesions and wound were irrigated and loosely closed with 4-0 Nylon x3 horizontal sutures.  Xeroform, 4x4s, and Coban wrap was applied to the right hand.  The patient tolerated both procedures well and returned to recovery in stable condition.     Sheral Apley Burney Gauze,  M.D.     MAW/MEDQ  D:  07/17/2013  T:  07/17/2013  Job:  [id number]

## 2013-08-26 ENCOUNTER — Other Ambulatory Visit: Payer: Self-pay | Admitting: Internal Medicine

## 2013-08-29 DIAGNOSIS — M7710 Lateral epicondylitis, unspecified elbow: Secondary | ICD-10-CM | POA: Diagnosis not present

## 2013-09-17 DIAGNOSIS — M7710 Lateral epicondylitis, unspecified elbow: Secondary | ICD-10-CM | POA: Diagnosis not present

## 2013-09-24 DIAGNOSIS — C4442 Squamous cell carcinoma of skin of scalp and neck: Secondary | ICD-10-CM | POA: Diagnosis not present

## 2013-09-24 DIAGNOSIS — D485 Neoplasm of uncertain behavior of skin: Secondary | ICD-10-CM | POA: Diagnosis not present

## 2013-09-30 ENCOUNTER — Encounter: Payer: Self-pay | Admitting: Internal Medicine

## 2013-09-30 ENCOUNTER — Ambulatory Visit (INDEPENDENT_AMBULATORY_CARE_PROVIDER_SITE_OTHER): Payer: Medicare Other | Admitting: Internal Medicine

## 2013-09-30 VITALS — BP 130/72 | HR 60 | Temp 98.1°F | Resp 18 | Ht 72.5 in | Wt 234.4 lb

## 2013-09-30 DIAGNOSIS — I10 Essential (primary) hypertension: Secondary | ICD-10-CM

## 2013-09-30 DIAGNOSIS — Z789 Other specified health status: Secondary | ICD-10-CM

## 2013-09-30 DIAGNOSIS — E785 Hyperlipidemia, unspecified: Secondary | ICD-10-CM

## 2013-09-30 DIAGNOSIS — Z125 Encounter for screening for malignant neoplasm of prostate: Secondary | ICD-10-CM

## 2013-09-30 DIAGNOSIS — E669 Obesity, unspecified: Secondary | ICD-10-CM

## 2013-09-30 DIAGNOSIS — E559 Vitamin D deficiency, unspecified: Secondary | ICD-10-CM | POA: Diagnosis not present

## 2013-09-30 DIAGNOSIS — Z1212 Encounter for screening for malignant neoplasm of rectum: Secondary | ICD-10-CM

## 2013-09-30 DIAGNOSIS — Z79899 Other long term (current) drug therapy: Secondary | ICD-10-CM

## 2013-09-30 DIAGNOSIS — E66811 Obesity, class 1: Secondary | ICD-10-CM | POA: Insufficient documentation

## 2013-09-30 DIAGNOSIS — Z1331 Encounter for screening for depression: Secondary | ICD-10-CM

## 2013-09-30 DIAGNOSIS — R7309 Other abnormal glucose: Secondary | ICD-10-CM | POA: Diagnosis not present

## 2013-09-30 DIAGNOSIS — R7303 Prediabetes: Secondary | ICD-10-CM

## 2013-09-30 LAB — HEPATIC FUNCTION PANEL
ALK PHOS: 81 U/L (ref 39–117)
ALT: 32 U/L (ref 0–53)
AST: 25 U/L (ref 0–37)
Albumin: 4.6 g/dL (ref 3.5–5.2)
Bilirubin, Direct: 0.1 mg/dL (ref 0.0–0.3)
Indirect Bilirubin: 0.5 mg/dL (ref 0.2–1.2)
TOTAL PROTEIN: 7.1 g/dL (ref 6.0–8.3)
Total Bilirubin: 0.6 mg/dL (ref 0.2–1.2)

## 2013-09-30 LAB — CBC WITH DIFFERENTIAL/PLATELET
BASOS PCT: 1 % (ref 0–1)
Basophils Absolute: 0.1 10*3/uL (ref 0.0–0.1)
EOS PCT: 4 % (ref 0–5)
Eosinophils Absolute: 0.3 10*3/uL (ref 0.0–0.7)
HEMATOCRIT: 47.5 % (ref 39.0–52.0)
HEMOGLOBIN: 16.5 g/dL (ref 13.0–17.0)
Lymphocytes Relative: 26 % (ref 12–46)
Lymphs Abs: 2.1 10*3/uL (ref 0.7–4.0)
MCH: 31.7 pg (ref 26.0–34.0)
MCHC: 34.7 g/dL (ref 30.0–36.0)
MCV: 91.3 fL (ref 78.0–100.0)
MONO ABS: 0.6 10*3/uL (ref 0.1–1.0)
MONOS PCT: 7 % (ref 3–12)
NEUTROS ABS: 5 10*3/uL (ref 1.7–7.7)
Neutrophils Relative %: 62 % (ref 43–77)
Platelets: 220 10*3/uL (ref 150–400)
RBC: 5.2 MIL/uL (ref 4.22–5.81)
RDW: 13.8 % (ref 11.5–15.5)
WBC: 8.1 10*3/uL (ref 4.0–10.5)

## 2013-09-30 LAB — LIPID PANEL
Cholesterol: 157 mg/dL (ref 0–200)
HDL: 51 mg/dL (ref >39)
LDL CALC: 82 mg/dL (ref 0–99)
Total CHOL/HDL Ratio: 3.1 Ratio
Triglycerides: 118 mg/dL (ref <150)
VLDL: 24 mg/dL (ref 0–40)

## 2013-09-30 LAB — BASIC METABOLIC PANEL WITH GFR
BUN: 14 mg/dL (ref 6–23)
CHLORIDE: 103 meq/L (ref 96–112)
CO2: 25 mEq/L (ref 19–32)
CREATININE: 0.76 mg/dL (ref 0.50–1.35)
Calcium: 9.9 mg/dL (ref 8.4–10.5)
GFR, Est Non African American: >89 mL/min
GLUCOSE: 89 mg/dL (ref 70–99)
POTASSIUM: 4.3 meq/L (ref 3.5–5.3)
Sodium: 140 mEq/L (ref 135–145)

## 2013-09-30 LAB — MAGNESIUM: Magnesium: 1.9 mg/dL (ref 1.5–2.5)

## 2013-09-30 NOTE — Progress Notes (Signed)
Patient ID: [name], male   DOB: 12/21/1945, 68 y.o.   MRN: [id number]   Annual Screening Comprehensive Examination  This very nice 67 y.o.  MWM presents for complete physical.  Patient has been followed for HTN,  Prediabetes, Hyperlipidemia, and Vitamin D Deficiency.   HTN predates since 2007. Patient's BP has been controlled at home.Today's BP: 130/72 mmHg. Patient denies any cardiac symptoms as chest pain, palpitations, shortness of breath, dizziness or ankle swelling.   Patient's hyperlipidemia is controlled with diet and medications. Patient denies myalgias or other medication SE's. Last cholesterol last visit was 157,  HDL 44 and LDL 62 in Dec 2014 at goal with triglycerides 254 not at goal.     Patient has prediabetes with last A1c 5.4% in Dec 2014. Patient denies reactive hypoglycemic symptoms, visual blurring, diabetic polys, or paresthesias.    Finally, patient has history of Vitamin D Deficiency of 60 in 2008 with last vitamin D 76 in Sept 2014.     Medication Sig  . ALPRAZolam (XANAX) 1 MG tablet 1/2 to 1 QHS but may also use sparingly up to TID  . amitriptyline (ELAVIL) 25 MG tablet Take 25 mg by mouth at bedtime.  Marland Kitchen aspirin 81 MG tablet Take 81 mg by mouth daily.   . Cholecalciferol (VITAMIN D-3) 5000 UNITS TABS Take 5,000 Units by mouth daily.   . ciprofloxacin (CIPRO) 500 MG tablet Take 500 mg by mouth 2 (two) times daily.  . enalapril (VASOTEC) 20 MG tablet TAKE ONE TABLET BY MOUTH DAILY FOR BLOOD PRESSURE  . finasteride (PROSCAR) 5 MG tablet Take 5 mg by mouth daily.  . hyoscyamine (LEVSIN SL) 0.125 MG SL tablet 1 tab 4 x daily as needed - cramping/nausea  . Multiple Vitamin (MULTIVITAMIN) tablet Take 1 tablet by mouth daily.  . phenazopyridine (PYRIDIUM) 200 MG tablet Take 200 mg by mouth 3 (three) times daily as needed for pain.  . rosuvastatin (CRESTOR) 40 MG tablet Take 20 mg by mouth daily.   . tadalafil (CIALIS) 5 MG tablet Take 5 mg by mouth daily as  needed for erectile dysfunction.  . vitamin C (ASCORBIC ACID) 500 MG tablet Take 500 mg by mouth daily.    Allergies  Allergen Reactions  . Clonazepam Other (See Comments)    "mean"  . Flomax [Tamsulosin Hcl] Other (See Comments)    hypotension  . Lipitor [Atorvastatin] Other (See Comments)    Myalgias    Past Medical History  Diagnosis Date  . Hypertension   . Hyperlipidemia   . BPH (benign prostatic hypertrophy)   . Pelvic pain in male   . Frequency of urination   . Urgency of urination   . Nocturia   . Arthritis   . Mild acid reflux occasional  . Pre-diabetes   . IBS (irritable bowel syndrome)   . Vitamin D deficiency   . Obesity   . Wears glasses     Past Surgical History  Procedure Laterality Date  . Appendectomy  02-07-2007  . Anterior cervical decomp/discectomy fusion  01-25-2002    C4 - C5  . Left inguinal hernia repair  1970  . Umbilical hernia repair  2003  . Left rotator cuff repair  2002  . Anterior cruciate ligament repair  1990    RIGHT  . Cervical fusion  2000    C5 - 6  . Left ankle surg.  1980  . Hip surgery  1999    LEFT HIP --  REMOVAL CALCIUM BUILD-UP  .  Colonoscopy  05-30-2011    HEMORRHOIDS  . Tonsillectomy    . Trigger finger release Right 07/17/2013    Procedure: RELEASE A-1 PULLEY RIGHT RING FINGER;  Surgeon: Schuyler Amor, MD;  Location: [facility];  Service: Orthopedics;  Laterality: Right;  . Tendon reconstruction Left 07/17/2013    Procedure: LEFT ELBOW LATERAL RECONSTRUCTION;  Surgeon: Schuyler Amor, MD;  Location: [facility];  Service: Orthopedics;  Laterality: Left;    Family History  Problem Relation Age of Onset  . Heart disease Mother   . Stroke Mother   . Heart disease Father   . Diabetes Father     History   Social History  . Marital Status: Re-married 5&1/2 years after 51 yr marriage til widowed    Spouse Name: N/A    Number of Children: 1  . Years of Education: N/A    Occupational History  . Business Consultant    Social History Main Topics  . Smoking status: Never Smoker   . Smokeless tobacco: Never Used  . Alcohol Use: Yes     Comment: OCCASIONAL  . Drug Use: No  . Sexual Activity: Occasional     ROS Constitutional: Denies fever, chills, weight loss/gain, headaches, insomnia, fatigue, night sweats, and change in appetite. Eyes: Denies redness, blurred vision, diplopia, discharge, itchy, watery eyes.  ENT: Denies discharge, congestion, post nasal drip, epistaxis, sore throat, earache, hearing loss, dental pain, Tinnitus, Vertigo, Sinus pain, snoring.  Cardio: Denies chest pain, palpitations, irregular heartbeat, syncope, dyspnea, diaphoresis, orthopnea, PND, claudication, edema Respiratory: denies cough, dyspnea, DOE, pleurisy, hoarseness, laryngitis, wheezing.  Gastrointestinal: Denies dysphagia, heartburn, reflux, water brash, pain, cramps, nausea, vomiting, bloating, diarrhea, constipation, hematemesis, melena, hematochezia, jaundice, hemorrhoids Genitourinary: Denies dysuria, frequency, urgency, nocturia, hesitancy, discharge, hematuria, flank pain Musculoskeletal: Denies arthralgia, myalgia, stiffness, Jt. Swelling, pain, limp, and strain/sprain. Skin: Denies puritis, rash, hives, warts, acne, eczema, changing in skin lesion Neuro: No weakness, tremor, incoordination, spasms, paresthesia, pain Psychiatric: Denies confusion, memory loss, sensory loss. Currently in marital counseling Endocrine: Denies change in weight, skin, hair change, nocturia, and paresthesia, diabetic polys, visual blurring, hyper / hypo glycemic episodes.  Heme/Lymph: No excessive bleeding, bruising, or elarged lymph nodes.  Physical Exam  BP 130/72  Pulse 60  Temp(Src) 98.1 F (36.7 C) (Temporal)  Resp 18  Ht 6' 0.5" (1.842 m)  Wt 234 lb 6.4 oz (106.323 kg)  BMI 31.34 kg/m2  General Appearance: Well nourished, in no apparent distress. Eyes: PERRLA, EOMs,  conjunctiva no swelling or erythema, normal fundi and vessels. Sinuses: No frontal/maxillary tenderness ENT/Mouth: EACs patent / TMs  nl. Nares clear without erythema, swelling, mucoid exudates. Oral hygiene is good. No erythema, swelling, or exudate. Tongue normal, non-obstructing. Tonsils not swollen or erythematous. Hearing normal.  Neck: Supple, thyroid normal. No bruits, nodes or JVD. Respiratory: Respiratory effort normal.  BS equal and clear bilateral without rales, rhonci, wheezing or stridor. Cardio: Heart sounds are normal with regular rate and rhythm and no murmurs, rubs or gallops. Peripheral pulses are normal and equal bilaterally without edema. No aortic or femoral bruits. Chest: symmetric with normal excursions and percussion.  Abdomen: Flat, soft, with bowl sounds. Nontender, no guarding, rebound, hernias, masses, or organomegaly.  Lymphatics: Non tender without lymphadenopathy.  Genitourinary:Deferred - done recently by Dr Allen Kell. Musculoskeletal: Full ROM all peripheral extremities, joint stability, 5/5 strength, and normal gait. Skin: Warm and dry without rashes, lesions, cyanosis, clubbing or  ecchymosis.  Neuro: Cranial nerves intact, reflexes equal  bilaterally. Normal muscle tone, no cerebellar symptoms. Sensation intact.  Pysch: Awake and oriented X 3, normal affect, insight and judgment appropriate.   Assessment and Plan   1. Hypertension  2. Hyperlipidemia 3. Pre Diabetes 4 Vitamin D Deficiency 5. Obesity (BMI  31.34) Continue prudent diet as discussed, weight control, BP monitoring, regular exercise, and medications as discussed.  Discussed med effects and SE's. Routine screening labs and tests as requested with regular follow-up as recommended.

## 2013-09-30 NOTE — Patient Instructions (Signed)

## 2013-10-01 DIAGNOSIS — M7710 Lateral epicondylitis, unspecified elbow: Secondary | ICD-10-CM | POA: Diagnosis not present

## 2013-10-01 LAB — MICROALBUMIN / CREATININE URINE RATIO
Creatinine, Urine: 173.1 mg/dL
Microalb Creat Ratio: 204.9 mg/g — ABNORMAL HIGH (ref 0.0–30.0)
Microalb, Ur: 35.47 mg/dL — ABNORMAL HIGH (ref 0.00–1.89)

## 2013-10-01 LAB — INSULIN, FASTING: Insulin fasting, serum: 19 u[IU]/mL (ref 3–28)

## 2013-10-01 LAB — URINALYSIS, MICROSCOPIC ONLY
BACTERIA UA: NONE SEEN
CRYSTALS: NONE SEEN
Casts: NONE SEEN
Squamous Epithelial / LPF: NONE SEEN

## 2013-10-01 LAB — HEMOGLOBIN A1C
HEMOGLOBIN A1C: 5.4 % (ref <5.7)
MEAN PLASMA GLUCOSE: 108 mg/dL (ref <117)

## 2013-10-01 LAB — VITAMIN D 25 HYDROXY (VIT D DEFICIENCY, FRACTURES): Vit D, 25-Hydroxy: 62 ng/mL (ref 30–89)

## 2013-10-01 LAB — TSH: TSH: 1.076 u[IU]/mL (ref 0.350–4.500)

## 2013-10-01 LAB — PSA: PSA: 2.44 ng/mL (ref <=4.00)

## 2013-10-17 DIAGNOSIS — L905 Scar conditions and fibrosis of skin: Secondary | ICD-10-CM | POA: Diagnosis not present

## 2013-10-17 DIAGNOSIS — C4442 Squamous cell carcinoma of skin of scalp and neck: Secondary | ICD-10-CM | POA: Diagnosis not present

## 2013-10-21 DIAGNOSIS — H53029 Refractive amblyopia, unspecified eye: Secondary | ICD-10-CM | POA: Diagnosis not present

## 2013-10-21 DIAGNOSIS — H2510 Age-related nuclear cataract, unspecified eye: Secondary | ICD-10-CM | POA: Diagnosis not present

## 2013-10-29 DIAGNOSIS — M7710 Lateral epicondylitis, unspecified elbow: Secondary | ICD-10-CM | POA: Diagnosis not present

## 2013-11-26 DIAGNOSIS — I10 Essential (primary) hypertension: Secondary | ICD-10-CM | POA: Diagnosis not present

## 2013-11-26 DIAGNOSIS — N401 Enlarged prostate with lower urinary tract symptoms: Secondary | ICD-10-CM | POA: Diagnosis not present

## 2013-11-26 DIAGNOSIS — N138 Other obstructive and reflux uropathy: Secondary | ICD-10-CM | POA: Diagnosis not present

## 2013-11-26 DIAGNOSIS — Z79899 Other long term (current) drug therapy: Secondary | ICD-10-CM | POA: Diagnosis not present

## 2013-11-26 DIAGNOSIS — Z85828 Personal history of other malignant neoplasm of skin: Secondary | ICD-10-CM | POA: Diagnosis not present

## 2013-11-27 DIAGNOSIS — Z85828 Personal history of other malignant neoplasm of skin: Secondary | ICD-10-CM | POA: Diagnosis not present

## 2013-11-27 DIAGNOSIS — T83091A Other mechanical complication of indwelling urethral catheter, initial encounter: Secondary | ICD-10-CM | POA: Diagnosis not present

## 2013-11-27 DIAGNOSIS — I10 Essential (primary) hypertension: Secondary | ICD-10-CM | POA: Diagnosis not present

## 2013-11-27 DIAGNOSIS — Z466 Encounter for fitting and adjustment of urinary device: Secondary | ICD-10-CM | POA: Diagnosis not present

## 2013-11-27 DIAGNOSIS — Z79899 Other long term (current) drug therapy: Secondary | ICD-10-CM | POA: Diagnosis not present

## 2013-11-29 DIAGNOSIS — R350 Frequency of micturition: Secondary | ICD-10-CM | POA: Diagnosis not present

## 2013-11-29 DIAGNOSIS — N410 Acute prostatitis: Secondary | ICD-10-CM | POA: Diagnosis not present

## 2013-11-29 DIAGNOSIS — R339 Retention of urine, unspecified: Secondary | ICD-10-CM | POA: Diagnosis not present

## 2013-12-17 DIAGNOSIS — R300 Dysuria: Secondary | ICD-10-CM | POA: Diagnosis not present

## 2013-12-25 ENCOUNTER — Other Ambulatory Visit: Payer: Self-pay | Admitting: Emergency Medicine

## 2014-01-13 DIAGNOSIS — N419 Inflammatory disease of prostate, unspecified: Secondary | ICD-10-CM | POA: Diagnosis not present

## 2014-01-13 DIAGNOSIS — R350 Frequency of micturition: Secondary | ICD-10-CM | POA: Diagnosis not present

## 2014-01-14 ENCOUNTER — Ambulatory Visit: Payer: Self-pay | Admitting: Physician Assistant

## 2014-01-16 ENCOUNTER — Encounter: Payer: Self-pay | Admitting: Physician Assistant

## 2014-01-16 ENCOUNTER — Ambulatory Visit (INDEPENDENT_AMBULATORY_CARE_PROVIDER_SITE_OTHER): Payer: Medicare Other | Admitting: Physician Assistant

## 2014-01-16 VITALS — BP 122/70 | HR 60 | Temp 98.1°F | Resp 16 | Wt 244.0 lb

## 2014-01-16 DIAGNOSIS — I10 Essential (primary) hypertension: Secondary | ICD-10-CM

## 2014-01-16 DIAGNOSIS — Z0000 Encounter for general adult medical examination without abnormal findings: Secondary | ICD-10-CM

## 2014-01-16 DIAGNOSIS — E785 Hyperlipidemia, unspecified: Secondary | ICD-10-CM | POA: Diagnosis not present

## 2014-01-16 DIAGNOSIS — R7303 Prediabetes: Secondary | ICD-10-CM

## 2014-01-16 DIAGNOSIS — Z789 Other specified health status: Secondary | ICD-10-CM

## 2014-01-16 DIAGNOSIS — Z1331 Encounter for screening for depression: Secondary | ICD-10-CM

## 2014-01-16 DIAGNOSIS — Z79899 Other long term (current) drug therapy: Secondary | ICD-10-CM | POA: Diagnosis not present

## 2014-01-16 DIAGNOSIS — E669 Obesity, unspecified: Secondary | ICD-10-CM

## 2014-01-16 DIAGNOSIS — R7309 Other abnormal glucose: Secondary | ICD-10-CM | POA: Diagnosis not present

## 2014-01-16 DIAGNOSIS — E559 Vitamin D deficiency, unspecified: Secondary | ICD-10-CM

## 2014-01-16 DIAGNOSIS — N400 Enlarged prostate without lower urinary tract symptoms: Secondary | ICD-10-CM

## 2014-01-16 LAB — CBC WITH DIFFERENTIAL/PLATELET
BASOS ABS: 0.1 10*3/uL (ref 0.0–0.1)
Basophils Relative: 1 % (ref 0–1)
EOS ABS: 0.4 10*3/uL (ref 0.0–0.7)
EOS PCT: 5 % (ref 0–5)
HCT: 45.6 % (ref 39.0–52.0)
Hemoglobin: 16 g/dL (ref 13.0–17.0)
LYMPHS ABS: 2 10*3/uL (ref 0.7–4.0)
Lymphocytes Relative: 27 % (ref 12–46)
MCH: 32.1 pg (ref 26.0–34.0)
MCHC: 35.1 g/dL (ref 30.0–36.0)
MCV: 91.6 fL (ref 78.0–100.0)
Monocytes Absolute: 0.6 10*3/uL (ref 0.1–1.0)
Monocytes Relative: 8 % (ref 3–12)
Neutro Abs: 4.3 10*3/uL (ref 1.7–7.7)
Neutrophils Relative %: 59 % (ref 43–77)
PLATELETS: 184 10*3/uL (ref 150–400)
RBC: 4.98 MIL/uL (ref 4.22–5.81)
RDW: 14.4 % (ref 11.5–15.5)
WBC: 7.3 10*3/uL (ref 4.0–10.5)

## 2014-01-16 LAB — HEMOGLOBIN A1C
HEMOGLOBIN A1C: 5.3 % (ref <5.7)
Mean Plasma Glucose: 105 mg/dL (ref <117)

## 2014-01-16 NOTE — Patient Instructions (Signed)
Preventative Care for Adults, Male       REGULAR HEALTH EXAMS:  A routine yearly physical is a good way to check in with your primary care provider about your health and preventive screening. It is also an opportunity to share updates about your health and any concerns you have, and receive a thorough all-over exam.   Most health insurance companies pay for at least some preventative services.  Check with your health plan for specific coverages.  WHAT PREVENTATIVE SERVICES DO MEN NEED?  Adult men should have their weight and blood pressure checked regularly.   Men age 35 and older should have their cholesterol levels checked regularly.  Beginning at age 50 and continuing to age 75, men should be screened for colorectal cancer.  Certain people should may need continued testing until age 85.  Other cancer screening may include exams for testicular and prostate cancer.  Updating vaccinations is part of preventative care.  Vaccinations help protect against diseases such as the flu.  Lab tests are generally done as part of preventative care to screen for anemia and blood disorders, to screen for problems with the kidneys and liver, to screen for bladder problems, to check blood sugar, and to check your cholesterol level.  Preventative services generally include counseling about diet, exercise, avoiding tobacco, drugs, excessive alcohol consumption, and sexually transmitted infections.    GENERAL RECOMMENDATIONS FOR GOOD HEALTH:  Healthy diet:  Eat a variety of foods, including fruit, vegetables, animal or vegetable protein, such as meat, fish, chicken, and eggs, or beans, lentils, tofu, and grains, such as rice.  Drink plenty of water daily.  Decrease saturated fat in the diet, avoid lots of red meat, processed foods, sweets, fast foods, and fried foods.  Exercise:  Aerobic exercise helps maintain good heart health. At least 30-40 minutes of moderate-intensity exercise is recommended.  For example, a brisk walk that increases your heart rate and breathing. This should be done on most days of the week.   Find a type of exercise or a variety of exercises that you enjoy so that it becomes a part of your daily life.  Examples are running, walking, swimming, water aerobics, and biking.  For motivation and support, explore group exercise such as aerobic class, spin class, Zumba, Yoga,or  martial arts, etc.    Set exercise goals for yourself, such as a certain weight goal, walk or run in a race such as a 5k walk/run.  Speak to your primary care provider about exercise goals.  Disease prevention:  If you smoke or chew tobacco, find out from your caregiver how to quit. It can literally save your life, no matter how long you have been a tobacco user. If you do not use tobacco, never begin.   Maintain a healthy diet and normal weight. Increased weight leads to problems with blood pressure and diabetes.   The Body Mass Index or BMI is a way of measuring how much of your body is fat. Having a BMI above 27 increases the risk of heart disease, diabetes, hypertension, stroke and other problems related to obesity. Your caregiver can help determine your BMI and based on it develop an exercise and dietary program to help you achieve or maintain this important measurement at a healthful level.  High blood pressure causes heart and blood vessel problems.  Persistent high blood pressure should be treated with medicine if weight loss and exercise do not work.   Fat and cholesterol leaves deposits in your arteries   that can block them. This causes heart disease and vessel disease elsewhere in your body.  If your cholesterol is found to be high, or if you have heart disease or certain other medical conditions, then you may need to have your cholesterol monitored frequently and be treated with medication.   Ask if you should have a stress test if your history suggests this. A stress test is a test done on  a treadmill that looks for heart disease. This test can find disease prior to there being a problem.  Avoid drinking alcohol in excess (more than two drinks per day).  Avoid use of street drugs. Do not share needles with anyone. Ask for professional help if you need assistance or instructions on stopping the use of alcohol, cigarettes, and/or drugs.  Brush your teeth twice a day with fluoride toothpaste, and floss once a day. Good oral hygiene prevents tooth decay and gum disease. The problems can be painful, unattractive, and can cause other health problems. Visit your dentist for a routine oral and dental check up and preventive care every 6-12 months.   Look at your skin regularly.  Use a mirror to look at your back. Notify your caregivers of changes in moles, especially if there are changes in shapes, colors, a size larger than a pencil eraser, an irregular border, or development of new moles.  Safety:  Use seatbelts 100% of the time, whether driving or as a passenger.  Use safety devices such as hearing protection if you work in environments with loud noise or significant background noise.  Use safety glasses when doing any work that could send debris in to the eyes.  Use a helmet if you ride a bike or motorcycle.  Use appropriate safety gear for contact sports.  Talk to your caregiver about gun safety.  Use sunscreen with a SPF (or skin protection factor) of 15 or greater.  Lighter skinned people are at a greater risk of skin cancer. Don't forget to also wear sunglasses in order to protect your eyes from too much damaging sunlight. Damaging sunlight can accelerate cataract formation.   Practice safe sex. Use condoms. Condoms are used for birth control and to help reduce the spread of sexually transmitted infections (or STIs).  Some of the STIs are gonorrhea (the clap), chlamydia, syphilis, trichomonas, herpes, HPV (human papilloma virus) and HIV (human immunodeficiency virus) which causes AIDS.  The herpes, HIV and HPV are viral illnesses that have no cure. These can result in disability, cancer and death.   Keep carbon monoxide and smoke detectors in your home functioning at all times. Change the batteries every 6 months or use a model that plugs into the wall.   Vaccinations:  Stay up to date with your tetanus shots and other required immunizations. You should have a booster for tetanus every 10 years. Be sure to get your flu shot every year, since 5%-20% of the [place] population comes down with the flu. The flu vaccine changes each year, so being vaccinated once is not enough. Get your shot in the fall, before the flu season peaks.   Other vaccines to consider:  Pneumococcal vaccine to protect against certain types of pneumonia.  This is normally recommended for adults age 65 or older.  However, adults younger than 68 years old with certain underlying conditions such as diabetes, heart or lung disease should also receive the vaccine.  Shingles vaccine to protect against Varicella Zoster if you are older than age 60, or younger   than 68 years old with certain underlying illness.  Hepatitis A vaccine to protect against a form of infection of the liver by a virus acquired from food.  Hepatitis B vaccine to protect against a form of infection of the liver by a virus acquired from blood or body fluids, particularly if you work in health care.  If you plan to travel internationally, check with your local health department for specific vaccination recommendations.  Cancer Screening:  Most routine colon cancer screening begins at the age of 50. On a yearly basis, doctors may provide special easy to use take-home tests to check for hidden blood in the stool. Sigmoidoscopy or colonoscopy can detect the earliest forms of colon cancer and is life saving. These tests use a small camera at the end of a tube to directly examine the colon. Speak to your caregiver about this at age 50, when routine  screening begins (and is repeated every 5 years unless early forms of pre-cancerous polyps or small growths are found).   At the age of 50 men usually start screening for prostate cancer every year. Screening may begin at a younger age for those with higher risk. Those at higher risk include African-Americans or having a family history of prostate cancer. There are two types of tests for prostate cancer:   Prostate-specific antigen (PSA) testing. Recent studies raise questions about prostate cancer using PSA and you should discuss this with your caregiver.   Digital rectal exam (in which your doctor's lubricated and gloved finger feels for enlargement of the prostate through the anus).   Screening for testicular cancer.  Do a monthly exam of your testicles. Gently roll each testicle between your thumb and fingers, feeling for any abnormal lumps. The best time to do this is after a hot shower or bath when the tissues are looser. Notify your caregivers of any lumps, tenderness or changes in size or shape immediately.     

## 2014-01-16 NOTE — Progress Notes (Signed)
MEDICARE ANNUAL WELLNESS VISIT AND FOLLOW UP Assessment:   1. Essential hypertension - CBC with Differential - BASIC METABOLIC PANEL WITH GFR - Hepatic function panel - TSH  2. Pre-diabetes Discussed general issues about diabetes pathophysiology and management., Educational material distributed., Suggested low cholesterol diet., Encouraged aerobic exercise., Discussed foot care., Reminded to get yearly retinal exam. - Hemoglobin A1c - Insulin, fasting  3. BPH (benign prostatic hypertrophy)  4. Encounter for long-term (current) use of other medications - Magnesium  5. Hyperlipidemia - Lipid panel  6. Obesity BMI 31.3) Obesity with co morbidities- long discussion about weight loss, diet, and exercise  7. Vitamin D deficiency   Plan:   During the course of the visit the patient was educated and counseled about appropriate screening and preventive services including:    Pneumococcal vaccine   Influenza vaccine  Td vaccine  Screening electrocardiogram  Colorectal cancer screening  Diabetes screening  Glaucoma screening  Nutrition counseling   Screening recommendations, referrals: Vaccinations: Tdap vaccine not indicated Influenza vaccine not indicated Pneumococcal vaccine not indicated Shingles vaccine not indicated Hep B vaccine not indicated Prevnar 13: will get at [facility]  Nutrition assessed and recommended  Colonoscopy due 2018 Recommended yearly ophthalmology/optometry visit for glaucoma screening and checkup Recommended yearly dental visit for hygiene and checkup Advanced directives - requested  Conditions/risks identified: BMI: Discussed weight loss, diet, and increase physical activity.  Increase physical activity: AHA recommends 150 minutes of physical activity a week.  Medications reviewed Urinary Incontinence is not an issue: discussed non pharmacology and pharmacology options.  Fall risk: low- discussed PT, home fall assessment, medications.     Subjective:  [name] is a 68 y.o. male who presents for Medicare Annual Wellness Visit and 3 month follow up for HTN, hyperlipidemia, and vitamin D Def.  Date of last medicare wellness visit was is unknown.  His blood pressure has been controlled at home, today their BP is BP: 122/70 mmHg He does workout. He denies chest pain, shortness of breath, dizziness.  He is on cholesterol medication and denies myalgias. His cholesterol is at goal. The cholesterol last visit was:   Lab Results  Component Value Date   CHOL 157 09/30/2013   HDL 51 09/30/2013   LDLCALC 82 09/30/2013   TRIG 118 09/30/2013   CHOLHDL 3.1 09/30/2013    Last A1C in the office was:  Lab Results  Component Value Date   HGBA1C 5.4 09/30/2013   Patient is on Vitamin D supplement.   Lab Results  Component Value Date   VD25OH 62 09/30/2013     Currently on meloxicam and finesteride, off of bASA for recent prostatitis.  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   Names of Other Physician/Practitioners you currently use: 1. [facility] here for primary care 2. Dr. Sherral Hammers, eye doctor, visit yearly 3. Dr. Aida Puffer, dentist, q 6 months 4. [facility], [place] for prostatitis. Currently on meloxicam and finesteride, off of bASA Patient Care Team: Unk Pinto, MD as PCP - General (Internal Medicine) Schuyler Amor, MD as Consulting Physician (Orthopedic Surgery)  Medication Review: Current Outpatient Prescriptions on File Prior to Visit  Medication Sig Dispense Refill  . ALPRAZolam (XANAX) 1 MG tablet TAKE 1/2 TO 1 TABLET AT BEDTIME USE SPARINGLY UP TO 3 TIMES A DAY  90 tablet  1  . amitriptyline (ELAVIL) 25 MG tablet Take 25 mg by mouth at bedtime.      Marland Kitchen aspirin 81  MG tablet Take 81 mg by mouth daily.       Marland Kitchen atenolol (TENORMIN) 50 MG tablet Take 50 mg by mouth daily.      . Cholecalciferol (VITAMIN D-3) 5000 UNITS TABS Take 5,000 Units by mouth  daily.       . ciprofloxacin (CIPRO) 500 MG tablet Take 500 mg by mouth 2 (two) times daily.      . enalapril (VASOTEC) 20 MG tablet TAKE ONE TABLET BY MOUTH DAILY FOR BLOOD PRESSURE  90 tablet  0  . finasteride (PROSCAR) 5 MG tablet Take 5 mg by mouth daily.      . hyoscyamine (LEVSIN SL) 0.125 MG SL tablet PLACE 2 TABLETS UNDER THE TONGUE EVERY 4 HOURS AS NEEDED FOR CRAMPING.  30 tablet  2  . Multiple Vitamin (MULTIVITAMIN) tablet Take 1 tablet by mouth daily.      . phenazopyridine (PYRIDIUM) 200 MG tablet Take 200 mg by mouth 3 (three) times daily as needed for pain.      . rosuvastatin (CRESTOR) 40 MG tablet Take 20 mg by mouth daily.       . tadalafil (CIALIS) 5 MG tablet Take 5 mg by mouth daily as needed for erectile dysfunction.      . vitamin C (ASCORBIC ACID) 500 MG tablet Take 500 mg by mouth daily.       No current facility-administered medications on file prior to visit.    Current Problems (verified) Patient Active Problem List   Diagnosis Date Noted  . Encounter for long-term (current) use of other medications 09/30/2013  . Obesity BMI 31.3) 09/30/2013  . Hypertension   . Hyperlipidemia   . BPH (benign prostatic hypertrophy)   . Pre-diabetes   . IBS (irritable bowel syndrome)   . Vitamin D deficiency     Screening Tests Health Maintenance  Topic Date Due  . Tetanus/tdap  01/28/1965  . Colonoscopy  01/29/1996  . Pneumococcal Polysaccharide Vaccine Age 25 And Over  01/29/2011  . Influenza Vaccine  02/08/2014  . Zostavax  Completed    Immunization History  Administered Date(s) Administered  . DTaP 07/11/2005  . Influenza Whole 04/02/2013  . Pneumococcal Polysaccharide-23 09/08/2008  . Zoster 12/09/2008    Preventative care: Last colonoscopy: 2008 Dr. Deatra Ina due 2018  Prior vaccinations: TD or Tdap: 2007  Influenza: 2014  Pneumococcal: 2010 Shingles/Zostavax: 2010  History reviewed: allergies, current medications, past family history, past medical  history, past social history, past surgical history and problem list   Risk Factors: Tobacco History  Substance Use Topics  . Smoking status: Never Smoker   . Smokeless tobacco: Never Used  . Alcohol Use: Yes     Comment: OCCASIONAL   He does not smoke.  Patient is not a former smoker. Are there smokers in your home (other than you)?  No  Alcohol Current alcohol use: social drinker  Caffeine Current caffeine use: coffee 2 /day  Exercise Current exercise: walking  Nutrition/Diet Current diet: in general, a "healthy" diet    Cardiac risk factors: advanced age (older than 26 for men, 63 for women), dyslipidemia, hypertension, male gender, obesity (BMI >= 30 kg/m2) and sedentary lifestyle.  Depression Screen (Note: if answer to either of the following is "Yes", a more complete depression screening is indicated)   Q1: Over the past two weeks, have you felt down, depressed or hopeless? No  Q2: Over the past two weeks, have you felt little interest or pleasure in doing things? No  Have you  lost interest or pleasure in daily life? No  Do you often feel hopeless? No  Do you cry easily over simple problems? No  Activities of Daily Living In your present state of health, do you have any difficulty performing the following activities?:  Driving? No Managing money?  No Feeding yourself? No Getting from bed to chair? No Climbing a flight of stairs? No Preparing food and eating?: No Bathing or showering? No Getting dressed: No Getting to the toilet? No Using the toilet:No Moving around from place to place: No In the past year have you fallen or had a near fall?:No   Are you sexually active?  No  Do you have more than one partner?  No  Vision Difficulties: No  Hearing Difficulties: No Do you often ask people to speak up or repeat themselves? No Do you experience ringing or noises in your ears? No Do you have difficulty understanding soft or whispered voices?  No  Cognition  Do you feel that you have a problem with memory?No  Do you often misplace items? No  Do you feel safe at home?  Yes  Advanced directives Does patient have a [place]? Yes Does patient have a Living Will? Yes   Objective:   Blood pressure 122/70, pulse 60, temperature 98.1 F (36.7 C), resp. rate 16, weight 244 lb (110.678 kg). Body mass index is 32.62 kg/(m^2).  General appearance: alert, no distress, WD/WN, male Cognitive Testing  Alert? Yes  Normal Appearance?Yes  Oriented to person? Yes  Place? Yes   Time? Yes  Recall of three objects?  Yes  Can perform simple calculations? Yes  Displays appropriate judgment?Yes  Can read the correct time from a watch face?Yes  HEENT: normocephalic, sclerae anicteric, TMs pearly, nares patent, no discharge or erythema, pharynx normal Oral cavity: MMM, no lesions Neck: supple, no lymphadenopathy, no thyromegaly, no masses Heart: RRR, normal S1, S2, no murmurs Lungs: CTA bilaterally, no wheezes, rhonchi, or rales Abdomen: +bs, soft, non tender, non distended, no masses, no hepatomegaly, no splenomegaly Musculoskeletal: nontender, no swelling, no obvious deformity Extremities: no edema, no cyanosis, no clubbing Pulses: 2+ symmetric, upper and lower extremities, normal cap refill Neurological: alert, oriented x 3, CN2-12 intact, strength normal upper extremities and lower extremities, sensation normal throughout, DTRs 2+ throughout, no cerebellar signs, gait normal Psychiatric: normal affect, behavior normal, pleasant   Medicare Attestation I have personally reviewed: The patient's medical and social history Their use of alcohol, tobacco or illicit drugs Their current medications and supplements The patient's functional ability including ADLs,fall risks, home safety risks, cognitive, and hearing and visual impairment Diet and physical activities Evidence for depression or mood disorders  The  patient's weight, height, BMI, and visual acuity have been recorded in the chart.  I have made referrals, counseling, and provided education to the patient based on review of the above and I have provided the patient with a written personalized care plan for preventive services.     Vicie Mutters, PA-C   01/16/2014

## 2014-01-17 LAB — LIPID PANEL
CHOL/HDL RATIO: 3 ratio
CHOLESTEROL: 143 mg/dL (ref 0–200)
HDL: 48 mg/dL (ref >39)
LDL Cholesterol: 78 mg/dL (ref 0–99)
Triglycerides: 85 mg/dL (ref <150)
VLDL: 17 mg/dL (ref 0–40)

## 2014-01-17 LAB — HEPATIC FUNCTION PANEL
ALBUMIN: 4.4 g/dL (ref 3.5–5.2)
ALT: 28 U/L (ref 0–53)
AST: 29 U/L (ref 0–37)
Alkaline Phosphatase: 69 U/L (ref 39–117)
BILIRUBIN TOTAL: 0.6 mg/dL (ref 0.2–1.2)
Bilirubin, Direct: 0.1 mg/dL (ref 0.0–0.3)
Indirect Bilirubin: 0.5 mg/dL (ref 0.2–1.2)
Total Protein: 6.9 g/dL (ref 6.0–8.3)

## 2014-01-17 LAB — BASIC METABOLIC PANEL WITH GFR
BUN: 14 mg/dL (ref 6–23)
CO2: 26 meq/L (ref 19–32)
Calcium: 9.7 mg/dL (ref 8.4–10.5)
Chloride: 106 mEq/L (ref 96–112)
Creat: 0.82 mg/dL (ref 0.50–1.35)
GFR, Est African American: >89 mL/min
GFR, Est Non African American: >89 mL/min
Glucose, Bld: 91 mg/dL (ref 70–99)
Potassium: 4.5 mEq/L (ref 3.5–5.3)
Sodium: 140 mEq/L (ref 135–145)

## 2014-01-17 LAB — MAGNESIUM: MAGNESIUM: 1.9 mg/dL (ref 1.5–2.5)

## 2014-01-17 LAB — INSULIN, FASTING: Insulin fasting, serum: 23 u[IU]/mL (ref 3–28)

## 2014-01-17 LAB — TSH: TSH: 1.935 u[IU]/mL (ref 0.350–4.500)

## 2014-02-07 ENCOUNTER — Other Ambulatory Visit: Payer: Self-pay | Admitting: *Deleted

## 2014-02-07 MED ORDER — ENALAPRIL MALEATE 20 MG PO TABS
ORAL_TABLET | ORAL | Status: DC
Start: 2014-02-07 — End: 2015-05-21

## 2014-02-07 MED ORDER — ENALAPRIL MALEATE 20 MG PO TABS: ORAL_TABLET | ORAL | Status: DC

## 2014-02-26 DIAGNOSIS — Z85828 Personal history of other malignant neoplasm of skin: Secondary | ICD-10-CM | POA: Diagnosis not present

## 2014-02-26 DIAGNOSIS — D235 Other benign neoplasm of skin of trunk: Secondary | ICD-10-CM | POA: Diagnosis not present

## 2014-02-26 DIAGNOSIS — L821 Other seborrheic keratosis: Secondary | ICD-10-CM | POA: Diagnosis not present

## 2014-02-26 DIAGNOSIS — L570 Actinic keratosis: Secondary | ICD-10-CM | POA: Diagnosis not present

## 2014-03-03 DIAGNOSIS — M25519 Pain in unspecified shoulder: Secondary | ICD-10-CM | POA: Diagnosis not present

## 2014-03-03 DIAGNOSIS — S43429A Sprain of unspecified rotator cuff capsule, initial encounter: Secondary | ICD-10-CM | POA: Diagnosis not present

## 2014-04-10 ENCOUNTER — Ambulatory Visit (INDEPENDENT_AMBULATORY_CARE_PROVIDER_SITE_OTHER): Payer: Medicare Other | Admitting: Physician Assistant

## 2014-04-10 ENCOUNTER — Encounter: Payer: Self-pay | Admitting: Physician Assistant

## 2014-04-10 VITALS — BP 140/82 | HR 58 | Temp 97.8°F | Resp 16 | Ht 72.0 in | Wt 238.0 lb

## 2014-04-10 DIAGNOSIS — R0600 Dyspnea, unspecified: Secondary | ICD-10-CM | POA: Diagnosis not present

## 2014-04-10 DIAGNOSIS — R7989 Other specified abnormal findings of blood chemistry: Secondary | ICD-10-CM

## 2014-04-10 DIAGNOSIS — R079 Chest pain, unspecified: Secondary | ICD-10-CM | POA: Diagnosis not present

## 2014-04-10 DIAGNOSIS — I10 Essential (primary) hypertension: Secondary | ICD-10-CM | POA: Diagnosis not present

## 2014-04-10 DIAGNOSIS — R791 Abnormal coagulation profile: Secondary | ICD-10-CM | POA: Diagnosis not present

## 2014-04-10 LAB — HEPATIC FUNCTION PANEL
ALBUMIN: 4.4 g/dL (ref 3.5–5.2)
ALK PHOS: 78 U/L (ref 39–117)
ALT: 40 U/L (ref 0–53)
AST: 30 U/L (ref 0–37)
BILIRUBIN INDIRECT: 0.4 mg/dL (ref 0.2–1.2)
BILIRUBIN TOTAL: 0.5 mg/dL (ref 0.2–1.2)
Bilirubin, Direct: 0.1 mg/dL (ref 0.0–0.3)
Total Protein: 6.7 g/dL (ref 6.0–8.3)

## 2014-04-10 LAB — CBC WITH DIFFERENTIAL/PLATELET
Basophils Absolute: 0.1 10*3/uL (ref 0.0–0.1)
Basophils Relative: 1 % (ref 0–1)
Eosinophils Absolute: 0.2 10*3/uL (ref 0.0–0.7)
Eosinophils Relative: 3 % (ref 0–5)
HEMATOCRIT: 46.7 % (ref 39.0–52.0)
Hemoglobin: 15.9 g/dL (ref 13.0–17.0)
LYMPHS PCT: 20 % (ref 12–46)
Lymphs Abs: 1.6 10*3/uL (ref 0.7–4.0)
MCH: 31.5 pg (ref 26.0–34.0)
MCHC: 34 g/dL (ref 30.0–36.0)
MCV: 92.5 fL (ref 78.0–100.0)
Monocytes Absolute: 0.6 10*3/uL (ref 0.1–1.0)
Monocytes Relative: 7 % (ref 3–12)
NEUTROS PCT: 69 % (ref 43–77)
Neutro Abs: 5.6 10*3/uL (ref 1.7–7.7)
Platelets: 200 10*3/uL (ref 150–400)
RBC: 5.05 MIL/uL (ref 4.22–5.81)
RDW: 14 % (ref 11.5–15.5)
WBC: 8.1 10*3/uL (ref 4.0–10.5)

## 2014-04-10 LAB — BASIC METABOLIC PANEL WITH GFR
BUN: 12 mg/dL (ref 6–23)
CHLORIDE: 104 meq/L (ref 96–112)
CO2: 27 mEq/L (ref 19–32)
Calcium: 10.3 mg/dL (ref 8.4–10.5)
Creat: 0.81 mg/dL (ref 0.50–1.35)
GFR, Est African American: >89 mL/min
Glucose, Bld: 84 mg/dL (ref 70–99)
Potassium: 3.9 mEq/L (ref 3.5–5.3)
SODIUM: 139 meq/L (ref 135–145)

## 2014-04-10 MED ORDER — VERAPAMIL HCL 120 MG PO TABS: 120.0000 mg | ORAL_TABLET | Freq: Every day | ORAL | Status: DC

## 2014-04-10 NOTE — Patient Instructions (Signed)

## 2014-04-10 NOTE — Progress Notes (Addendum)
   Subjective:    Patient ID: [name], male    DOB: [birth year], 68 y.o.   MRN: [id number]  HPI 68 y.o. male with history of obesity, hyperlipidemia, prediabetes, and family history of MI presents for chest pain. He recently stopped his atenolol Sunday, he has had increase stress and will be traveling over next two weeks.   He was in [place] for class reunion, began to have intermittent chest pain. Had elbow surgery in Jan. Then he didn't think about it, has been intermittent. Then woke up this AM and states it was worse, He has had some fatigue but denies SOB, nausea, dizziness, diaphoresis. States he can go upstairs and have some SOB. He is having very localized sharp pain at left chest, no radiation. Nothing makes it worse, reclining makes it a little better. Tums did not help. No edema, cramping in legs. He is on cialsis 5mg  1/3 a pll daily. Normal lexiscan 10/2012.   Review of Systems  Constitutional: Positive for fatigue. Negative for fever, chills, diaphoresis, activity change, appetite change and unexpected weight change.  HENT: Negative.   Respiratory: Negative.   Cardiovascular: Positive for chest pain. Negative for palpitations and leg swelling.  Gastrointestinal: Negative.        Beltching  Genitourinary: Negative.   Musculoskeletal: Negative.   Neurological: Negative.        Objective:   Physical Exam  Constitutional: He is oriented to person, place, and time. He appears well-developed and well-nourished.  HENT:  Head: Normocephalic and atraumatic.  Eyes: Conjunctivae are normal. Pupils are equal, round, and reactive to light.  Neck: Normal range of motion. Neck supple.  Cardiovascular: Normal rate, regular rhythm and normal heart sounds.   No murmur heard. Pulmonary/Chest: Effort normal and breath sounds normal.  Abdominal: Soft. Bowel sounds are normal. There is tenderness (epigatric,mild). There is no rebound and no guarding.  Musculoskeletal: Normal  range of motion. He exhibits tenderness (slightl tenderness left costocondrial).  Neurological: He is alert and oriented to person, place, and time. No cranial nerve deficit.  Skin: Skin is warm and dry.       Assessment & Plan:  Atypical nonexertional chest pain- ? From anxiety, GERD, PE Add verapamil 180 QHS Check CBC, BMP, Ddimer since recent travel Dexilant samples Normal EKG- Rate 60, slightly peaked T waves but no change from previous EKG and new T wave inversion V1? Lead placement  Go to the ER if any CP, SOB, nausea, dizziness, severe HA, changes vision/speech   Addendum: + Ddimer with several weeks of travel/long flights, chest pain, dyspnea- will schedule CTA- if any worsening SOB/CP patient instructed to go to ER

## 2014-04-11 ENCOUNTER — Other Ambulatory Visit: Payer: Self-pay | Admitting: Physician Assistant

## 2014-04-11 ENCOUNTER — Ambulatory Visit
Admission: RE | Admit: 2014-04-11 | Discharge: 2014-04-11 | Disposition: A | Discharge status: Home / Self Care | Payer: Medicare Other | Source: Ambulatory Visit | Attending: Physician Assistant | Admitting: Physician Assistant

## 2014-04-11 DIAGNOSIS — R079 Chest pain, unspecified: Secondary | ICD-10-CM | POA: Diagnosis not present

## 2014-04-11 DIAGNOSIS — R0600 Dyspnea, unspecified: Secondary | ICD-10-CM

## 2014-04-11 DIAGNOSIS — R791 Abnormal coagulation profile: Secondary | ICD-10-CM | POA: Diagnosis not present

## 2014-04-11 DIAGNOSIS — J9811 Atelectasis: Secondary | ICD-10-CM | POA: Diagnosis not present

## 2014-04-11 DIAGNOSIS — R7989 Other specified abnormal findings of blood chemistry: Secondary | ICD-10-CM

## 2014-04-11 DIAGNOSIS — R0602 Shortness of breath: Secondary | ICD-10-CM | POA: Diagnosis not present

## 2014-04-11 LAB — D-DIMER, QUANTITATIVE: D-Dimer, Quant: 1.69 ug/mL-FEU — ABNORMAL HIGH (ref 0.00–0.48)

## 2014-04-11 MED ORDER — IOHEXOL 350 MG/ML SOLN
100.0000 mL | Freq: Once | INTRAVENOUS | Status: AC | PRN
  Administered 2014-04-11: 100 mL via INTRAVENOUS

## 2014-04-11 MED ORDER — AZITHROMYCIN 250 MG PO TABS: ORAL_TABLET | ORAL | Status: AC

## 2014-04-11 MED ORDER — MELOXICAM 15 MG PO TABS: ORAL_TABLET | ORAL | Status: DC

## 2014-04-11 NOTE — Addendum Note (Signed)
Addended by: Vicie Mutters R on: 04/11/2014 08:23 AM   Modules accepted: Orders

## 2014-04-17 ENCOUNTER — Ambulatory Visit: Payer: Self-pay | Admitting: Internal Medicine

## 2014-04-18 ENCOUNTER — Ambulatory Visit: Payer: Self-pay | Admitting: Internal Medicine

## 2014-05-01 ENCOUNTER — Encounter: Payer: Self-pay | Admitting: Internal Medicine

## 2014-05-01 ENCOUNTER — Ambulatory Visit (INDEPENDENT_AMBULATORY_CARE_PROVIDER_SITE_OTHER): Payer: Medicare Other | Admitting: Internal Medicine

## 2014-05-01 VITALS — BP 130/80 | HR 68 | Temp 98.2°F | Resp 16 | Ht 72.0 in | Wt 236.6 lb

## 2014-05-01 DIAGNOSIS — Z23 Encounter for immunization: Secondary | ICD-10-CM

## 2014-05-01 DIAGNOSIS — R7309 Other abnormal glucose: Secondary | ICD-10-CM

## 2014-05-01 DIAGNOSIS — E559 Vitamin D deficiency, unspecified: Secondary | ICD-10-CM

## 2014-05-01 DIAGNOSIS — R7303 Prediabetes: Secondary | ICD-10-CM

## 2014-05-01 DIAGNOSIS — E785 Hyperlipidemia, unspecified: Secondary | ICD-10-CM

## 2014-05-01 DIAGNOSIS — Z79899 Other long term (current) drug therapy: Secondary | ICD-10-CM | POA: Diagnosis not present

## 2014-05-01 DIAGNOSIS — I10 Essential (primary) hypertension: Secondary | ICD-10-CM

## 2014-05-01 MED ORDER — PREDNISONE 20 MG PO TABS: ORAL_TABLET | ORAL | Status: DC

## 2014-05-01 NOTE — Progress Notes (Signed)
Patient ID: [name], male   DOB: 1946-06-10, 67 y.o.   MRN: [id number]   This very nice 68 y.o.MWM presents for  follow up with Hypertension, Hyperlipidemia, Pre-Diabetes and Vitamin D Deficiency.  He does c/o of a 2-3 week Hx of a non exertional Left sided para-sternal sharp chest discomfort. He was seen recently for this discomfort and had a CTs of the chest which was Negative and Normal. There is a positional component with stretching his arms and twisting his torso leftward. There's been no cough, congestion, sputum pdn or  hemoptysis.   Patient is treated for HTN & BP has been controlled at home. Today's BP: 130/80 mmHg. Patient has had no complaints of any cardiac type exertional chest pains, palpitations, dyspnea/orthopnea/PND, dizziness, claudication, or dependent edema.   Hyperlipidemia is controlled with diet & meds. Patient denies myalgias or other med SE's. Last Lipids wereTotal Chol 143; HDL 48; LDL  78; Trig 85  On  01/16/2014.   Also, the patient has history of  PreDiabetes and has had no symptoms of reactive hypoglycemia, diabetic polys, paresthesias or visual blurring.  Last A1c was  5.3% on 01/16/2014.    Further, the patient also has history of Vitamin D Deficiency and supplements vitamin D without any suspected side-effects. Last vitamin D was  62 on 09/30/2013.   Medication List   ALPRAZolam 1 MG tablet  Commonly known as:  XANAX  TAKE 1/2 TO 1 TABLET AT BEDTIME USE SPARINGLY UP TO 3 TIMES A DAY     aspirin 81 MG tablet  Take 81 mg by mouth daily.     enalapril 20 MG tablet  Commonly known as:  VASOTEC  TAKE ONE TABLET BY MOUTH DAILY FOR BLOOD PRESSURE     finasteride 5 MG tablet  Commonly known as:  PROSCAR  Take 5 mg by mouth daily.     meloxicam 15 MG tablet  Commonly known as:  MOBIC  1 daily for pain with food as needed     multivitamin tablet  Take 1 tablet by mouth daily.     rosuvastatin 40 MG tablet  Commonly known as:  CRESTOR  Take 20 mg by  mouth daily.     tadalafil 5 MG tablet  Commonly known as:  CIALIS  Take 5 mg by mouth daily as needed for erectile dysfunction.     verapamil 120 MG tablet  Commonly known as:  CALAN  Take 1 tablet (120 mg total) by mouth at bedtime.     vitamin C 500 MG tablet  Commonly known as:  ASCORBIC ACID  Take 500 mg by mouth daily.     Vitamin D-3 5000 UNITS Tabs  Take 5,000 Units by mouth daily.     Allergies  Allergen Reactions  . Clonazepam Other (See Comments)    "mean"  . Flomax [Tamsulosin Hcl] Other (See Comments)    hypotension  . Lipitor [Atorvastatin] Other (See Comments)    Myalgias   PMHx:   Past Medical History  Diagnosis Date  . Hypertension   . Hyperlipidemia   . BPH (benign prostatic hypertrophy)   . Pelvic pain in male   . Frequency of urination   . Urgency of urination   . Nocturia   . Arthritis   . Mild acid reflux occasional  . Pre-diabetes   . IBS (irritable bowel syndrome)   . Vitamin D deficiency   . Obesity   . Wears glasses    Immunization History  Administered  Date(s) Administered  . DTaP 07/11/2005  . Influenza Whole 04/02/2013  . Influenza, High Dose Seasonal PF 05/01/2014  . Pneumococcal Conjugate-13 05/01/2014  . Pneumococcal Polysaccharide-23 09/08/2008  . Zoster 12/09/2008   Past Surgical History  Procedure Laterality Date  . Appendectomy  02-07-2007  . Anterior cervical decomp/discectomy fusion  01-25-2002    C4 - C5  . Left inguinal hernia repair  1970  . Umbilical hernia repair  2003  . Left rotator cuff repair  2002  . Anterior cruciate ligament repair  1990    RIGHT  . Cervical fusion  2000    C5 - 6  . Left ankle surg.  1980  . Hip surgery  1999    LEFT HIP --  REMOVAL CALCIUM BUILD-UP  . Colonoscopy  05-30-2011    HEMORRHOIDS  . Tonsillectomy    . Trigger finger release Right 07/17/2013    Procedure: RELEASE A-1 PULLEY RIGHT RING FINGER;  Surgeon: Schuyler Amor, MD;  Location: [facility];   Service: Orthopedics;  Laterality: Right;  . Tendon reconstruction Left 07/17/2013    Procedure: LEFT ELBOW LATERAL RECONSTRUCTION;  Surgeon: Schuyler Amor, MD;  Location: [facility];  Service: Orthopedics;  Laterality: Left;   FHx:    Reviewed / unchanged  SHx:    Reviewed / unchanged  Systems Review:  Constitutional: Denies fever, chills, wt changes, headaches, insomnia, fatigue, night sweats, change in appetite. Eyes: Denies redness, blurred vision, diplopia, discharge, itchy, watery eyes.  ENT: Denies discharge, congestion, post nasal drip, epistaxis, sore throat, earache, hearing loss, dental pain, tinnitus, vertigo, sinus pain, snoring.  CV: Denies palpitations, irregular heartbeat, syncope, dyspnea, diaphoresis, orthopnea, PND, claudication or edema. Respiratory: denies cough, dyspnea, DOE, pleurisy, hoarseness, laryngitis, wheezing.  Gastrointestinal: Denies dysphagia, odynophagia, heartburn, reflux, water brash, abdominal pain or cramps, nausea, vomiting, bloating, diarrhea, constipation, hematemesis, melena, hematochezia  or hemorrhoids. Genitourinary: Denies dysuria, frequency, urgency, nocturia, hesitancy, discharge, hematuria or flank pain. Musculoskeletal: Denies arthralgias, myalgias, stiffness, jt. swelling, pain, limping or strain/sprain.  Skin: Denies pruritus, rash, hives, warts, acne, eczema or change in skin lesion(s). Neuro: No weakness, tremor, incoordination, spasms, paresthesia or pain. Psychiatric: Denies confusion, memory loss or sensory loss. Endo: Denies change in weight, skin or hair change.  Heme/Lymph: No excessive bleeding, bruising or enlarged lymph nodes.  Exam:  BP 130/80  Pulse 68  Temp(Src) 98.2 F (36.8 C) (Temporal)  Resp 16  Ht 6' (1.829 m)  Wt 236 lb 9.6 oz (107.321 kg)  BMI 32.08 kg/m2  Appears well nourished and in no distress. Eyes: PERRLA, EOMs, conjunctiva no swelling or erythema. Sinuses: No frontal/maxillary  tenderness ENT/Mouth: EAC's clear, TM's nl w/o erythema, bulging. Nares clear w/o erythema, swelling, exudates. Oropharynx clear without erythema or exudates. Oral hygiene is good. Tongue normal, non obstructing. Hearing intact.  Neck: Supple. Thyroid nl. Car 2+/2+ without bruits, nodes or JVD. Chest: Respirations nl with BS clear & equal w/o rales, rhonchi, wheezing or stridor. Moderate point tenderness of the Left paraSternal CCjts with Pressure -Palpitation. Cor: Heart sounds normal w/ regular rate and rhythm without sig. murmurs, gallops, clicks, or rubs. Peripheral pulses normal and equal  without edema.  Abdomen: Soft & bowel sounds normal. Non-tender w/o guarding, rebound, hernias, masses, or organomegaly.  Lymphatics: Unremarkable.  Musculoskeletal: Full ROM all peripheral extremities, joint stability, 5/5 strength, and normal gait.  Skin: Warm, dry without exposed rashes, lesions or ecchymosis apparent.  Neuro: Cranial nerves intact, reflexes equal bilaterally. Sensory-motor testing grossly  intact. Tendon reflexes grossly intact.  Pysch: Alert & oriented x 3.  Insight and judgement nl & appropriate. No ideations.  Assessment and Plan:  1. Hypertension - Continue monitor blood pressure at home. Continue diet/meds same.  2. Hyperlipidemia - Continue diet/meds, exercise,& lifestyle modifications. Continue monitor periodic cholesterol/liver & renal functions   3. Pre-Diabetes - Continue diet, exercise, lifestyle modifications. Monitor appropriate labs.  4. Vitamin D Deficiency - Continue supplementation.  5. Costochondritis - Trial with heating pad and Prednisone pulse/taper. Hold Meloxicam while on Prednisone.   Recommended regular exercise, BP monitoring, weight control, and discussed med and SE's. Recommended labs to assess and monitor clinical status. Further disposition pending results of labs.

## 2014-05-01 NOTE — Patient Instructions (Addendum)
 Recommend the book "The END of DIETING" by Dr Joel Furman   and the book "The END of DIABETES " by Dr Joel Fuhrman  At [employer] - get book & Audio CD's      Being diabetic has a  300% increased risk for heart attack, stroke, cancer, and alzheimer- type vascular dementia. It is very important that you work harder with diet by avoiding all foods that are white except chicken & fish. Avoid white rice (brown & wild rice is OK), white potatoes (sweetpotatoes in moderation is OK), White bread or wheat bread or anything made out of white flour like bagels, donuts, rolls, buns, biscuits, cakes, pastries, cookies, pizza crust, and pasta (made from white flour & egg whites) - vegetarian pasta or spinach or wheat pasta is OK. Multigrain breads like Arnold's or [employer], or multigrain sandwich thins or flatbreads.  Diet, exercise and weight loss can reverse and cure diabetes in the early stages.  Diet, exercise and weight loss is very important in the control and prevention of complications of diabetes which affects every system in your body, ie. Brain - dementia/stroke, eyes - glaucoma/blindness, heart - heart attack/heart failure, kidneys - dialysis, stomach - gastric paralysis, intestines - malabsorption, nerves - severe painful neuritis, circulation - gangrene & loss of a leg(s), and finally cancer and Alzheimers.    I recommend avoid fried & greasy foods,  sweets/candy, white rice (brown or wild rice or Quinoa is OK), white potatoes (sweet potatoes are OK) - anything made from white flour - bagels, doughnuts, rolls, buns, biscuits,white and wheat breads, pizza crust and traditional pasta made of white flour & egg white(vegetarian pasta or spinach or wheat pasta is OK).  Multi-grain bread is OK - like multi-grain flat bread or sandwich thins. Avoid alcohol in excess. Exercise is also important.    Eat all the vegetables you want - avoid meat, especially red meat and dairy - especially cheese.  Cheese  is the most concentrated form of trans-fats which is the worst thing to clog up our arteries. Veggie cheese is OK which can be found in the fresh produce section at [employer] or [employer] or [employer]  Costochondritis Costochondritis, sometimes called Tietze syndrome, is a swelling and irritation (inflammation) of the tissue (cartilage) that connects your ribs with your breastbone (sternum). It causes pain in the chest and rib area. Costochondritis usually goes away on its own over time. It can take up to 6 weeks or longer to get better, especially if you are unable to limit your activities. CAUSES  Some cases of costochondritis have no known cause. Possible causes include:  Injury (trauma).  Exercise or activity such as lifting.  Severe coughing. SIGNS AND SYMPTOMS  Pain and tenderness in the chest and rib area.  Pain that gets worse when coughing or taking deep breaths.  Pain that gets worse with specific movements. DIAGNOSIS  Your health care provider will do a physical exam and ask about your symptoms. Chest X-rays or other tests may be done to rule out other problems. TREATMENT  Costochondritis usually goes away on its own over time. Your health care provider may prescribe medicine to help relieve pain. HOME CARE INSTRUCTIONS   Avoid exhausting physical activity. Try not to strain your ribs during normal activity. This would include any activities using chest, abdominal, and side muscles, especially if heavy weights are used.  Apply ice to the affected area for the first 2 days after the pain begins.  Put   ice in a plastic bag.  Place a towel between your skin and the bag.  Leave the ice on for 20 minutes, 2-3 times a day.  Only take over-the-counter or prescription medicines as directed by your health care provider. SEEK MEDICAL CARE IF:  You have redness or swelling at the rib joints. These are signs of infection.  Your pain does not go away despite rest or  medicine. SEEK IMMEDIATE MEDICAL CARE IF:   Your pain increases or you are very uncomfortable.  You have shortness of breath or difficulty breathing.  You cough up blood.  You have worse chest pains, sweating, or vomiting.  You have a fever or persistent symptoms for more than 2-3 days.  You have a fever and your symptoms suddenly get worse. MAKE SURE YOU:   Understand these instructions.  Will watch your condition.  Will get help right away if you are not doing well or get worse. Document Released: 04/06/2005 Document Revised: 04/17/2013 Document Reviewed: 01/29/2013 ExitCare Patient Information 2015 [employer]. This information is not intended to replace advice given to you by your health care provider. Make sure you discuss any questions you have with your health care provider.  

## 2014-05-02 LAB — LIPID PANEL
CHOLESTEROL: 145 mg/dL (ref 0–200)
HDL: 49 mg/dL (ref >39)
LDL CALC: 64 mg/dL (ref 0–99)
TRIGLYCERIDES: 160 mg/dL — AB (ref <150)
Total CHOL/HDL Ratio: 3 Ratio
VLDL: 32 mg/dL (ref 0–40)

## 2014-05-02 LAB — HEMOGLOBIN A1C
Hgb A1c MFr Bld: 5.6 % (ref <5.7)
MEAN PLASMA GLUCOSE: 114 mg/dL (ref <117)

## 2014-05-02 LAB — MAGNESIUM: Magnesium: 1.9 mg/dL (ref 1.5–2.5)

## 2014-05-02 LAB — INSULIN, FASTING: INSULIN FASTING, SERUM: 20 u[IU]/mL — AB (ref 2.0–19.6)

## 2014-05-02 LAB — VITAMIN D 25 HYDROXY (VIT D DEFICIENCY, FRACTURES): Vit D, 25-Hydroxy: 73 ng/mL (ref 30–89)

## 2014-05-02 LAB — TSH: TSH: 0.852 u[IU]/mL (ref 0.350–4.500)

## 2014-05-07 ENCOUNTER — Other Ambulatory Visit: Payer: Self-pay

## 2014-05-07 MED ORDER — VERAPAMIL HCL 120 MG PO TABS: 120.0000 mg | ORAL_TABLET | Freq: Every day | ORAL | Status: DC

## 2014-05-29 DIAGNOSIS — R339 Retention of urine, unspecified: Secondary | ICD-10-CM | POA: Diagnosis not present

## 2014-05-29 DIAGNOSIS — R310 Gross hematuria: Secondary | ICD-10-CM | POA: Diagnosis not present

## 2014-05-29 DIAGNOSIS — N419 Inflammatory disease of prostate, unspecified: Secondary | ICD-10-CM | POA: Diagnosis not present

## 2014-05-29 DIAGNOSIS — R300 Dysuria: Secondary | ICD-10-CM | POA: Diagnosis not present

## 2014-06-03 DIAGNOSIS — R310 Gross hematuria: Secondary | ICD-10-CM | POA: Diagnosis not present

## 2014-06-03 DIAGNOSIS — M545 Low back pain: Secondary | ICD-10-CM | POA: Diagnosis not present

## 2014-06-30 ENCOUNTER — Telehealth: Payer: Self-pay | Admitting: *Deleted

## 2014-06-30 ENCOUNTER — Emergency Department (HOSPITAL_BASED_OUTPATIENT_CLINIC_OR_DEPARTMENT_OTHER)
Admission: EM | Admit: 2014-06-30 | Discharge: 2014-07-01 | Disposition: A | Discharge status: Home / Self Care | Payer: Medicare Other | Attending: Emergency Medicine | Admitting: Emergency Medicine

## 2014-06-30 ENCOUNTER — Encounter (HOSPITAL_BASED_OUTPATIENT_CLINIC_OR_DEPARTMENT_OTHER): Payer: Self-pay | Admitting: *Deleted

## 2014-06-30 DIAGNOSIS — I10 Essential (primary) hypertension: Secondary | ICD-10-CM | POA: Insufficient documentation

## 2014-06-30 DIAGNOSIS — Z79899 Other long term (current) drug therapy: Secondary | ICD-10-CM | POA: Insufficient documentation

## 2014-06-30 DIAGNOSIS — E559 Vitamin D deficiency, unspecified: Secondary | ICD-10-CM | POA: Insufficient documentation

## 2014-06-30 DIAGNOSIS — R338 Other retention of urine: Secondary | ICD-10-CM

## 2014-06-30 DIAGNOSIS — N4889 Other specified disorders of penis: Secondary | ICD-10-CM | POA: Insufficient documentation

## 2014-06-30 DIAGNOSIS — Z8719 Personal history of other diseases of the digestive system: Secondary | ICD-10-CM | POA: Insufficient documentation

## 2014-06-30 DIAGNOSIS — Z791 Long term (current) use of non-steroidal anti-inflammatories (NSAID): Secondary | ICD-10-CM | POA: Insufficient documentation

## 2014-06-30 DIAGNOSIS — M1990 Unspecified osteoarthritis, unspecified site: Secondary | ICD-10-CM | POA: Diagnosis not present

## 2014-06-30 DIAGNOSIS — N400 Enlarged prostate without lower urinary tract symptoms: Secondary | ICD-10-CM | POA: Diagnosis not present

## 2014-06-30 DIAGNOSIS — E785 Hyperlipidemia, unspecified: Secondary | ICD-10-CM | POA: Insufficient documentation

## 2014-06-30 DIAGNOSIS — Z7952 Long term (current) use of systemic steroids: Secondary | ICD-10-CM | POA: Insufficient documentation

## 2014-06-30 DIAGNOSIS — Z7982 Long term (current) use of aspirin: Secondary | ICD-10-CM | POA: Diagnosis not present

## 2014-06-30 DIAGNOSIS — E669 Obesity, unspecified: Secondary | ICD-10-CM | POA: Insufficient documentation

## 2014-06-30 DIAGNOSIS — R339 Retention of urine, unspecified: Secondary | ICD-10-CM | POA: Diagnosis not present

## 2014-06-30 DIAGNOSIS — Z792 Long term (current) use of antibiotics: Secondary | ICD-10-CM | POA: Diagnosis not present

## 2014-06-30 LAB — URINALYSIS, ROUTINE W REFLEX MICROSCOPIC
Bilirubin Urine: NEGATIVE
Glucose, UA: NEGATIVE mg/dL
Ketones, ur: NEGATIVE mg/dL
LEUKOCYTES UA: NEGATIVE
NITRITE: POSITIVE — AB
PROTEIN: NEGATIVE mg/dL
SPECIFIC GRAVITY, URINE: 1.006 (ref 1.005–1.030)
UROBILINOGEN UA: 0.2 mg/dL (ref 0.0–1.0)
pH: 5.5 (ref 5.0–8.0)

## 2014-06-30 LAB — URINE MICROSCOPIC-ADD ON

## 2014-06-30 MED ORDER — CIPROFLOXACIN HCL 250 MG PO TABS: 250.0000 mg | ORAL_TABLET | Freq: Two times a day (BID) | ORAL | Status: DC

## 2014-06-30 NOTE — Telephone Encounter (Signed)
Patient called and states he has a recurrent UTI and requested an RX for Cipro.  OK to send in Cipro per Dr Abby Potash.

## 2014-06-30 NOTE — ED Notes (Signed)
Urinary retention tonight. Hx of same.

## 2014-07-01 NOTE — Discharge Instructions (Signed)
Acute Urinary Retention °Acute urinary retention is the temporary inability to urinate. °This is a common problem in older men. As men age their prostates become larger and block the flow of urine from the bladder. This is usually a problem that has come on gradually.  °HOME CARE INSTRUCTIONS °If you are sent home with a Foley catheter and a drainage system, you will need to discuss the best course of action with your health care provider. While the catheter is in, maintain a good intake of fluids. Keep the drainage bag emptied and lower than your catheter. This is so that contaminated urine will not flow back into your bladder, which could lead to a urinary tract infection. °There are two main types of drainage bags. One is a large bag that usually is used at night. It has a good capacity that will allow you to sleep through the night without having to empty it. The second type is called a leg bag. It has a smaller capacity, so it needs to be emptied more frequently. However, the main advantage is that it can be attached by a leg strap and can go underneath your clothing, allowing you the freedom to move about or leave your home. °Only take over-the-counter or prescription medicines for pain, discomfort, or fever as directed by your health care provider.  °SEEK MEDICAL CARE IF: °· You develop a low-grade fever. °· You experience spasms or leakage of urine with the spasms. °SEEK IMMEDIATE MEDICAL CARE IF:  °· You develop chills or fever. °· Your catheter stops draining urine. °· Your catheter falls out. °· You start to develop increased bleeding that does not respond to rest and increased fluid intake. °MAKE SURE YOU: °· Understand these instructions. °· Will watch your condition. °· Will get help right away if you are not doing well or get worse. °Document Released: 10/03/2000 Document Revised: 07/02/2013 Document Reviewed: 12/06/2012 °ExitCare® Patient Information ©2015 [employer]. This information is not  intended to replace advice given to you by your health care provider. Make sure you discuss any questions you have with your health care provider. ° °

## 2014-07-01 NOTE — ED Provider Notes (Addendum)
CSN: [id number]     Arrival date & time 06/30/14  2258 History   This chart was scribed for Johnathan Fines, MD by Randa Evens, ED Scribe. This patient was seen in room MHT13/MHT13 and the patient's care was started at 12:48 AM.      Chief Complaint  Patient presents with  . Urinary Retention   The history is provided by the patient. No language interpreter was used.   HPI Comments: [name] is a 68 y.o. male with history of BPH who presents to the Emergency Department complaining of dysuria for the past 2 days. The dysuria is consistent with previous episodes of prostatitis. He describes it as pain in the end of his penis and burning. Symptoms are mild. He was started on ciprofloxacin by his PCP yesterday.  He is here now with acute urinary retention that began yesterday evening. He rated his pain as an 8 out of 10 on arrival. Nursing staff placed a Foley catheter yielding 600 milliliters of orange urine (patient is taking Pyridium) which completely relieved his discomfort. He denies fever, chills, nausea, vomiting, diarrhea, or CP.    Past Medical History  Diagnosis Date  . Hypertension   . Hyperlipidemia   . BPH (benign prostatic hypertrophy)   . Pelvic pain in male   . Frequency of urination   . Urgency of urination   . Nocturia   . Arthritis   . Mild acid reflux occasional  . Pre-diabetes   . IBS (irritable bowel syndrome)   . Vitamin D deficiency   . Obesity   . Wears glasses    Past Surgical History  Procedure Laterality Date  . Appendectomy  02-07-2007  . Anterior cervical decomp/discectomy fusion  01-25-2002    C4 - C5  . Left inguinal hernia repair  1970  . Umbilical hernia repair  2003  . Left rotator cuff repair  2002  . Anterior cruciate ligament repair  1990    RIGHT  . Cervical fusion  2000    C5 - 6  . Left ankle surg.  1980  . Hip surgery  1999    LEFT HIP --  REMOVAL CALCIUM BUILD-UP  . Colonoscopy  05-30-2011    HEMORRHOIDS  .  Tonsillectomy    . Trigger finger release Right 07/17/2013    Procedure: RELEASE A-1 PULLEY RIGHT RING FINGER;  Surgeon: Schuyler Amor, MD;  Location: [facility];  Service: Orthopedics;  Laterality: Right;  . Tendon reconstruction Left 07/17/2013    Procedure: LEFT ELBOW LATERAL RECONSTRUCTION;  Surgeon: Schuyler Amor, MD;  Location: [place];  Service: Orthopedics;  Laterality: Left;   Family History  Problem Relation Age of Onset  . Heart disease Mother   . Stroke Mother   . Heart disease Father   . Diabetes Father    History  Substance Use Topics  . Smoking status: Never Smoker   . Smokeless tobacco: Never Used  . Alcohol Use: Yes     Comment: OCCASIONAL    Review of Systems  Constitutional: Negative for fever and chills.  Cardiovascular: Negative for chest pain.  Gastrointestinal: Negative for nausea, vomiting and diarrhea.  Genitourinary: Positive for difficulty urinating and penile pain.  All other systems reviewed and are negative.   Allergies  Clonazepam; Flomax; and Lipitor  Home Medications   Prior to Admission medications   Medication Sig Start Date End Date Taking? Authorizing Provider  ALPRAZolam Duanne Moron) 1 MG tablet TAKE 1/2  TO 1 TABLET AT BEDTIME USE SPARINGLY UP TO 3 TIMES A DAY 12/25/13   Melissa R Smith, PA-C  aspirin 81 MG tablet Take 81 mg by mouth daily.     Historical Provider, MD  Cholecalciferol (VITAMIN D-3) 5000 UNITS TABS Take 5,000 Units by mouth daily.     Historical Provider, MD  ciprofloxacin (CIPRO) 250 MG tablet Take 1 tablet (250 mg total) by mouth 2 (two) times daily. For UTI 06/30/14   Unk Pinto, MD  enalapril (VASOTEC) 20 MG tablet TAKE ONE TABLET BY MOUTH DAILY FOR BLOOD PRESSURE 02/07/14   Unk Pinto, MD  finasteride (PROSCAR) 5 MG tablet Take 5 mg by mouth daily.    Historical Provider, MD  meloxicam (MOBIC) 15 MG tablet 1 daily for pain with food as needed 04/11/14   Vicie Mutters, PA-C   Multiple Vitamin (MULTIVITAMIN) tablet Take 1 tablet by mouth daily.    Historical Provider, MD  predniSONE (DELTASONE) 20 MG tablet 1 tab 3 x day for 3 days, then 1 tab 2 x day for 3 days, then 1 tab 1 x day for 5 days, then 1 tablet every other day 05/01/14   Unk Pinto, MD  rosuvastatin (CRESTOR) 40 MG tablet Take 20 mg by mouth daily.     Historical Provider, MD  tadalafil (CIALIS) 5 MG tablet Take 5 mg by mouth daily as needed for erectile dysfunction.    Historical Provider, MD  verapamil (CALAN) 120 MG tablet Take 1 tablet (120 mg total) by mouth at bedtime. 05/07/14 05/07/15  Vicie Mutters, PA-C  vitamin C (ASCORBIC ACID) 500 MG tablet Take 500 mg by mouth daily.    Historical Provider, MD   Triage Vitals: BP 162/80 mmHg  Pulse 89  Temp(Src) 98.3 F (36.8 C) (Oral)  Resp 18  Ht 6' (1.829 m)  Wt 236 lb (107.049 kg)  BMI 32.00 kg/m2  SpO2 97%  Physical Exam  Nursing note and vitals reviewed. General: Well-developed, well-nourished male in no acute distress; appearance consistent with age of record HENT: normocephalic; atraumatic Eyes: pupils equal, round and reactive to light; extraocular muscles intact Neck: supple Heart: regular rate and rhythm Lungs: clear to auscultation bilaterally Abdomen: soft; nondistended; nontender; no masses or hepatosplenomegaly; bowel sounds present GU: Foley catheter in place draining orange-stained urine Extremities: No deformity; full range of motion; pulses normal Neurologic: Awake, alert and oriented; motor function intact in all extremities and symmetric; no facial droop Skin: Warm and dry Psychiatric: Normal mood and affect   ED Course  Procedures (including critical care time) DIAGNOSTIC STUDIES: Oxygen Saturation is 97% on RA, normal by my interpretation.    COORDINATION OF CARE: 12:56 AM-Discussed treatment plan with pt at bedside and pt agreed to plan.    MDM   Nursing notes and vitals signs, including pulse oximetry,  reviewed.  Summary of this visit's results, reviewed by myself:  Labs:  Results for orders placed or performed during the hospital encounter of 06/30/14 (from the past 24 hour(s))  Urinalysis, Routine w reflex microscopic     Status: Abnormal   Collection Time: 06/30/14 11:35 PM  Result Value Ref Range   Color, Urine ORANGE (A) YELLOW   APPearance CLEAR CLEAR   Specific Gravity, Urine 1.006 1.005 - 1.030   pH 5.5 5.0 - 8.0   Glucose, UA NEGATIVE NEGATIVE mg/dL   Hgb urine dipstick TRACE (A) NEGATIVE   Bilirubin Urine NEGATIVE NEGATIVE   Ketones, ur NEGATIVE NEGATIVE mg/dL   Protein, ur NEGATIVE  NEGATIVE mg/dL   Urobilinogen, UA 0.2 0.0 - 1.0 mg/dL   Nitrite POSITIVE (A) NEGATIVE   Leukocytes, UA NEGATIVE NEGATIVE  Urine microscopic-add on     Status: None   Collection Time: 06/30/14 11:35 PM  Result Value Ref Range   Squamous Epithelial / LPF RARE RARE   WBC, UA 0-2 <3 WBC/hpf   RBC / HPF 0-2 <3 RBC/hpf   Bacteria, UA RARE RARE    Final diagnoses:  Acute urinary retention   I personally performed the services described in this documentation, which was scribed in my presence. The recorded information has been reviewed and is accurate.     Johnathan Fines, MD 07/01/14 0310  Johnathan Fines, MD 07/01/14 [phone]

## 2014-07-02 DIAGNOSIS — R339 Retention of urine, unspecified: Secondary | ICD-10-CM | POA: Diagnosis not present

## 2014-07-07 ENCOUNTER — Other Ambulatory Visit: Payer: Self-pay | Admitting: Emergency Medicine

## 2014-07-15 DIAGNOSIS — N419 Inflammatory disease of prostate, unspecified: Secondary | ICD-10-CM | POA: Diagnosis not present

## 2014-07-15 DIAGNOSIS — R339 Retention of urine, unspecified: Secondary | ICD-10-CM | POA: Diagnosis not present

## 2014-07-18 ENCOUNTER — Ambulatory Visit: Payer: Self-pay | Admitting: Emergency Medicine

## 2014-08-06 ENCOUNTER — Ambulatory Visit (INDEPENDENT_AMBULATORY_CARE_PROVIDER_SITE_OTHER): Payer: Medicare Other | Admitting: Internal Medicine

## 2014-08-06 ENCOUNTER — Encounter: Payer: Self-pay | Admitting: Internal Medicine

## 2014-08-06 VITALS — BP 118/76 | HR 64 | Temp 98.1°F | Resp 16 | Ht 72.0 in | Wt 239.6 lb

## 2014-08-06 DIAGNOSIS — Z79899 Other long term (current) drug therapy: Secondary | ICD-10-CM | POA: Diagnosis not present

## 2014-08-06 DIAGNOSIS — R7309 Other abnormal glucose: Secondary | ICD-10-CM

## 2014-08-06 DIAGNOSIS — E559 Vitamin D deficiency, unspecified: Secondary | ICD-10-CM | POA: Diagnosis not present

## 2014-08-06 DIAGNOSIS — R7303 Prediabetes: Secondary | ICD-10-CM

## 2014-08-06 DIAGNOSIS — I10 Essential (primary) hypertension: Secondary | ICD-10-CM | POA: Diagnosis not present

## 2014-08-06 DIAGNOSIS — E669 Obesity, unspecified: Secondary | ICD-10-CM | POA: Diagnosis not present

## 2014-08-06 DIAGNOSIS — E785 Hyperlipidemia, unspecified: Secondary | ICD-10-CM

## 2014-08-06 LAB — CBC WITH DIFFERENTIAL/PLATELET
BASOS ABS: 0.1 10*3/uL (ref 0.0–0.1)
Basophils Relative: 1 % (ref 0–1)
EOS ABS: 0.3 10*3/uL (ref 0.0–0.7)
EOS PCT: 4 % (ref 0–5)
HCT: 49.4 % (ref 39.0–52.0)
HEMOGLOBIN: 16.8 g/dL (ref 13.0–17.0)
Lymphocytes Relative: 22 % (ref 12–46)
Lymphs Abs: 1.6 10*3/uL (ref 0.7–4.0)
MCH: 32 pg (ref 26.0–34.0)
MCHC: 34 g/dL (ref 30.0–36.0)
MCV: 94.1 fL (ref 78.0–100.0)
MPV: 9.9 fL (ref 8.6–12.4)
Monocytes Absolute: 0.7 10*3/uL (ref 0.1–1.0)
Monocytes Relative: 10 % (ref 3–12)
NEUTROS PCT: 63 % (ref 43–77)
Neutro Abs: 4.5 10*3/uL (ref 1.7–7.7)
PLATELETS: 187 10*3/uL (ref 150–400)
RBC: 5.25 MIL/uL (ref 4.22–5.81)
RDW: 13.3 % (ref 11.5–15.5)
WBC: 7.2 10*3/uL (ref 4.0–10.5)

## 2014-08-06 LAB — HEMOGLOBIN A1C
Hgb A1c MFr Bld: 5.3 % (ref <5.7)
Mean Plasma Glucose: 105 mg/dL (ref <117)

## 2014-08-06 NOTE — Progress Notes (Signed)
Patient ID: [name], male   DOB: 01-27-1946, 69 y.o.   MRN: [id number]   This very nice 69 y.o. MWM presents for 3 month follow up with Hypertension, Hyperlipidemia, Pre-Diabetes and Vitamin D Deficiency.    Patient is treated for HTN (2007) & BP has been controlled at home. Today's BP: 118/76 mmHg. Patient has had no complaints of any cardiac type chest pain, palpitations, dyspnea/orthopnea/PND, dizziness, claudication, or dependent edema.   Hyperlipidemia is controlled with diet & meds. Patient denies myalgias or other med SE's. Last Lipids were at goal - Total Chol 145; HDL  49; LDL 64; Trig 160 on 05/01/2014.   Also, the patient has history of Morbid Obesity (BMI 32.49) and consequent PreDiabetes since Mar 2011 with A1c 6.0% and he has been attempting dietary management. He has had no symptoms of reactive hypoglycemia, diabetic polys, paresthesias or visual blurring.  Last A1c was  5.6% on 05/01/2014.   Further, the patient also has history of Vitamin D Deficiency of 33 in 2008  and supplements vitamin D without any suspected side-effects. Last vitamin D was  73 on 05/01/2014.  Medication Sig  . ALPRAZolam (XANAX) 1 MG tablet Take 1/2 to 1 tablet up to 3 x daily if needed for anxiety or sleep  . aspirin 81 MG tablet Take 81 mg by mouth daily.   . Cholecalciferol (VITAMIN D-3) 5000 UNITS TABS Take 5,000 Units by mouth daily.   . enalapril (VASOTEC) 20 MG tablet TAKE ONE TABLET BY MOUTH DAILY FOR BLOOD PRESSURE  . finasteride (PROSCAR) 5 MG tablet Take 5 mg by mouth daily.  . meloxicam (MOBIC) 15 MG tablet 1 daily for pain with food as needed  . Multiple Vitamin (MULTIVITAMIN) tablet Take 1 tablet by mouth daily.  . rosuvastatin (CRESTOR) 40 MG tablet Take 20 mg by mouth daily.   . tadalafil (CIALIS) 5 MG tablet Take 5 mg by mouth daily as needed for erectile dysfunction.  . verapamil (CALAN) 120 MG tablet Take 1 tablet (120 mg total) by mouth at bedtime.  . vitamin C (ASCORBIC ACID)  500 MG tablet Take 500 mg by mouth daily.   Allergies  Allergen Reactions  . Clonazepam Other (See Comments)    "mean"  . Flomax [Tamsulosin Hcl] Other (See Comments)    hypotension  . Lipitor [Atorvastatin] Other (See Comments)    Myalgias   PMHx:   Past Medical History  Diagnosis Date  . Hypertension   . Hyperlipidemia   . BPH (benign prostatic hypertrophy)   . Pelvic pain in male   . Frequency of urination   . Urgency of urination   . Nocturia   . Arthritis   . Mild acid reflux occasional  . Pre-diabetes   . IBS (irritable bowel syndrome)   . Vitamin D deficiency   . Obesity   . Wears glasses    Immunization History  Administered Date(s) Administered  . DTaP 07/11/2005  . Influenza Whole 04/02/2013  . Influenza, High Dose Seasonal PF 05/01/2014  . Pneumococcal Conjugate-13 05/01/2014  . Pneumococcal Polysaccharide-23 09/08/2008  . Zoster 12/09/2008   Past Surgical History  Procedure Laterality Date  . Appendectomy  02-07-2007  . Anterior cervical decomp/discectomy fusion  01-25-2002    C4 - C5  . Left inguinal hernia repair  1970  . Umbilical hernia repair  2003  . Left rotator cuff repair  2002  . Anterior cruciate ligament repair  1990    RIGHT  . Cervical fusion  2000  C5 - 6  . Left ankle surg.  1980  . Hip surgery  1999    LEFT HIP --  REMOVAL CALCIUM BUILD-UP  . Colonoscopy  05-30-2011    HEMORRHOIDS  . Tonsillectomy    . Trigger finger release Right 07/17/2013    Procedure: RELEASE A-1 PULLEY RIGHT RING FINGER;  Surgeon: Schuyler Amor, MD;  Location: [facility];  Service: Orthopedics;  Laterality: Right;  . Tendon reconstruction Left 07/17/2013    Procedure: LEFT ELBOW LATERAL RECONSTRUCTION;  Surgeon: Schuyler Amor, MD;  Location: [place];  Service: Orthopedics;  Laterality: Left;   FHx:    Reviewed / unchanged  SHx:    Reviewed / unchanged  Systems Review:  Constitutional: Denies fever, chills, wt  changes, headaches, insomnia, fatigue, night sweats, change in appetite. Eyes: Denies redness, blurred vision, diplopia, discharge, itchy, watery eyes.  ENT: Denies discharge, congestion, post nasal drip, epistaxis, sore throat, earache, hearing loss, dental pain, tinnitus, vertigo, sinus pain, snoring.  CV: Denies chest pain, palpitations, irregular heartbeat, syncope, dyspnea, diaphoresis, orthopnea, PND, claudication or edema. Respiratory: denies cough, dyspnea, DOE, pleurisy, hoarseness, laryngitis, wheezing.  Gastrointestinal: Denies dysphagia, odynophagia, heartburn, reflux, water brash, abdominal pain or cramps, nausea, vomiting, bloating, diarrhea, constipation, hematemesis, melena, hematochezia  or hemorrhoids. Genitourinary: Denies dysuria, frequency, urgency, nocturia, hesitancy, discharge, hematuria or flank pain. Musculoskeletal: Denies arthralgias, myalgias, stiffness, jt. swelling, pain, limping or strain/sprain.  Skin: Denies pruritus, rash, hives, warts, acne, eczema or change in skin lesion(s). Neuro: No weakness, tremor, incoordination, spasms, paresthesia or pain. Psychiatric: Denies confusion, memory loss or sensory loss. Endo: Denies change in weight, skin or hair change.  Heme/Lymph: No excessive bleeding, bruising or enlarged lymph nodes.  Physical Exam  BP 118/76   Pulse 64  Temp 98.1 F   Resp 16  Ht 6'   Wt 239 lb 9.6 oz     BMI 32.49  Appears well nourished and in no distress. Eyes: PERRLA, EOMs, conjunctiva no swelling or erythema. Sinuses: No frontal/maxillary tenderness ENT/Mouth: EAC's clear, TM's nl w/o erythema, bulging. Nares clear w/o erythema, swelling, exudates. Oropharynx clear without erythema or exudates. Oral hygiene is good. Tongue normal, non obstructing. Hearing intact.  Neck: Supple. Thyroid nl. Car 2+/2+ without bruits, nodes or JVD. Chest: Respirations nl with BS clear & equal w/o rales, rhonchi, wheezing or stridor.  Cor: Heart sounds  normal w/ regular rate and rhythm without sig. murmurs, gallops, clicks, or rubs. Peripheral pulses normal and equal  without edema.  Abdomen: Soft & bowel sounds normal. Non-tender w/o guarding, rebound, hernias, masses, or organomegaly.  Lymphatics: Unremarkable.  Musculoskeletal: Full ROM all peripheral extremities, joint stability, 5/5 strength, and normal gait.  Skin: Warm, dry without exposed rashes, lesions or ecchymosis apparent.  Neuro: Cranial nerves intact, reflexes equal bilaterally. Sensory-motor testing grossly intact. Tendon reflexes grossly intact.  Pysch: Alert & oriented x 3.  Insight and judgement nl & appropriate. No ideations.  Assessment and Plan:  1. Essential hypertension  - TSH  2. Hyperlipidemia  - Lipid panel  3. Obesity    4. Pre-diabetes  - Hemoglobin A1c - Insulin, fasting  5. Vitamin D deficiency - Vit D  25 hydroxy   6. Medication management - CBC with Differential/Platelet - BASIC METABOLIC PANEL WITH GFR - Hepatic function panel - Magnesium    Recommended regular exercise, BP monitoring, weight control, and discussed med and SE's. Recommended labs to assess and monitor clinical status. Further disposition pending results of  labs.

## 2014-08-06 NOTE — Patient Instructions (Signed)

## 2014-08-07 ENCOUNTER — Other Ambulatory Visit: Payer: Self-pay | Admitting: Internal Medicine

## 2014-08-07 LAB — LIPID PANEL
Cholesterol: 135 mg/dL (ref 0–200)
HDL: 44 mg/dL (ref >39)
LDL Cholesterol: 72 mg/dL (ref 0–99)
TRIGLYCERIDES: 94 mg/dL (ref <150)
Total CHOL/HDL Ratio: 3.1 Ratio
VLDL: 19 mg/dL (ref 0–40)

## 2014-08-07 LAB — HEPATIC FUNCTION PANEL
ALK PHOS: 70 U/L (ref 39–117)
ALT: 36 U/L (ref 0–53)
AST: 27 U/L (ref 0–37)
Albumin: 4.5 g/dL (ref 3.5–5.2)
BILIRUBIN TOTAL: 0.7 mg/dL (ref 0.2–1.2)
Bilirubin, Direct: 0.2 mg/dL (ref 0.0–0.3)
Indirect Bilirubin: 0.5 mg/dL (ref 0.2–1.2)
Total Protein: 6.8 g/dL (ref 6.0–8.3)

## 2014-08-07 LAB — BASIC METABOLIC PANEL WITH GFR
BUN: 17 mg/dL (ref 6–23)
CHLORIDE: 106 meq/L (ref 96–112)
CO2: 27 meq/L (ref 19–32)
CREATININE: 0.87 mg/dL (ref 0.50–1.35)
Calcium: 9.7 mg/dL (ref 8.4–10.5)
GFR, Est African American: >89 mL/min
GFR, Est Non African American: 89 mL/min
Glucose, Bld: 87 mg/dL (ref 70–99)
Potassium: 4.4 mEq/L (ref 3.5–5.3)
Sodium: 143 mEq/L (ref 135–145)

## 2014-08-07 LAB — MAGNESIUM: Magnesium: 1.8 mg/dL (ref 1.5–2.5)

## 2014-08-07 LAB — VITAMIN D 25 HYDROXY (VIT D DEFICIENCY, FRACTURES): VIT D 25 HYDROXY: 49 ng/mL (ref 30–100)

## 2014-08-07 LAB — INSULIN, FASTING: Insulin fasting, serum: 9.9 u[IU]/mL (ref 2.0–19.6)

## 2014-08-07 LAB — TSH: TSH: 1.168 u[IU]/mL (ref 0.350–4.500)

## 2014-08-08 DIAGNOSIS — R339 Retention of urine, unspecified: Secondary | ICD-10-CM | POA: Diagnosis not present

## 2014-08-08 DIAGNOSIS — R350 Frequency of micturition: Secondary | ICD-10-CM | POA: Diagnosis not present

## 2014-08-13 ENCOUNTER — Other Ambulatory Visit: Payer: Self-pay | Admitting: Internal Medicine

## 2014-08-13 MED ORDER — ROSUVASTATIN CALCIUM 40 MG PO TABS: ORAL_TABLET | ORAL | Status: DC

## 2014-08-13 MED ORDER — TADALAFIL 20 MG PO TABS: ORAL_TABLET | ORAL | Status: DC

## 2014-08-15 DIAGNOSIS — R3914 Feeling of incomplete bladder emptying: Secondary | ICD-10-CM | POA: Diagnosis not present

## 2014-08-15 DIAGNOSIS — N401 Enlarged prostate with lower urinary tract symptoms: Secondary | ICD-10-CM | POA: Diagnosis not present

## 2014-09-03 DIAGNOSIS — D229 Melanocytic nevi, unspecified: Secondary | ICD-10-CM | POA: Diagnosis not present

## 2014-09-03 DIAGNOSIS — L570 Actinic keratosis: Secondary | ICD-10-CM | POA: Diagnosis not present

## 2014-09-03 DIAGNOSIS — L821 Other seborrheic keratosis: Secondary | ICD-10-CM | POA: Diagnosis not present

## 2014-09-03 DIAGNOSIS — D1801 Hemangioma of skin and subcutaneous tissue: Secondary | ICD-10-CM | POA: Diagnosis not present

## 2014-09-03 DIAGNOSIS — Z85828 Personal history of other malignant neoplasm of skin: Secondary | ICD-10-CM | POA: Diagnosis not present

## 2014-09-03 DIAGNOSIS — L719 Rosacea, unspecified: Secondary | ICD-10-CM | POA: Diagnosis not present

## 2014-09-09 ENCOUNTER — Other Ambulatory Visit: Payer: Self-pay | Admitting: Physician Assistant

## 2014-09-09 MED ORDER — BENZONATATE 100 MG PO CAPS: 100.0000 mg | ORAL_CAPSULE | Freq: Four times a day (QID) | ORAL | Status: DC | PRN

## 2014-09-09 MED ORDER — PREDNISONE 20 MG PO TABS: ORAL_TABLET | ORAL | Status: DC

## 2014-09-09 NOTE — Progress Notes (Signed)
Patient calling with 3 days of sore throat, headache, wheezing, and nonproductive cough, trying tylenol without relief.  Will send in prednisone and tessalon, get on allergy pill, if not better by Friday can send in zpak.

## 2014-10-01 ENCOUNTER — Encounter: Payer: Self-pay | Admitting: Internal Medicine

## 2014-10-17 ENCOUNTER — Encounter: Payer: Self-pay | Admitting: Internal Medicine

## 2014-10-21 ENCOUNTER — Encounter: Payer: Self-pay | Admitting: Internal Medicine

## 2014-11-03 DIAGNOSIS — M75102 Unspecified rotator cuff tear or rupture of left shoulder, not specified as traumatic: Secondary | ICD-10-CM | POA: Diagnosis not present

## 2014-11-06 ENCOUNTER — Ambulatory Visit (INDEPENDENT_AMBULATORY_CARE_PROVIDER_SITE_OTHER): Payer: Medicare Other | Admitting: Internal Medicine

## 2014-11-06 ENCOUNTER — Encounter: Payer: Self-pay | Admitting: Internal Medicine

## 2014-11-06 VITALS — BP 136/84 | HR 72 | Temp 97.7°F | Resp 16 | Ht 72.0 in | Wt 237.2 lb

## 2014-11-06 DIAGNOSIS — R7309 Other abnormal glucose: Secondary | ICD-10-CM | POA: Diagnosis not present

## 2014-11-06 DIAGNOSIS — E559 Vitamin D deficiency, unspecified: Secondary | ICD-10-CM

## 2014-11-06 DIAGNOSIS — E669 Obesity, unspecified: Secondary | ICD-10-CM

## 2014-11-06 DIAGNOSIS — Z79899 Other long term (current) drug therapy: Secondary | ICD-10-CM

## 2014-11-06 DIAGNOSIS — Z1212 Encounter for screening for malignant neoplasm of rectum: Secondary | ICD-10-CM

## 2014-11-06 DIAGNOSIS — E785 Hyperlipidemia, unspecified: Secondary | ICD-10-CM | POA: Diagnosis not present

## 2014-11-06 DIAGNOSIS — R7303 Prediabetes: Secondary | ICD-10-CM

## 2014-11-06 DIAGNOSIS — Z1331 Encounter for screening for depression: Secondary | ICD-10-CM

## 2014-11-06 DIAGNOSIS — I10 Essential (primary) hypertension: Secondary | ICD-10-CM

## 2014-11-06 DIAGNOSIS — N400 Enlarged prostate without lower urinary tract symptoms: Secondary | ICD-10-CM

## 2014-11-06 DIAGNOSIS — Z9181 History of falling: Secondary | ICD-10-CM

## 2014-11-06 DIAGNOSIS — Z125 Encounter for screening for malignant neoplasm of prostate: Secondary | ICD-10-CM

## 2014-11-06 LAB — LIPID PANEL
CHOL/HDL RATIO: 3.4 ratio
CHOLESTEROL: 158 mg/dL (ref 0–200)
HDL: 46 mg/dL (ref >=40)
LDL CALC: 93 mg/dL (ref 0–99)
TRIGLYCERIDES: 94 mg/dL (ref <150)
VLDL: 19 mg/dL (ref 0–40)

## 2014-11-06 LAB — CBC WITH DIFFERENTIAL/PLATELET
Basophils Absolute: 0.1 10*3/uL (ref 0.0–0.1)
Basophils Relative: 1 % (ref 0–1)
Eosinophils Absolute: 0.3 10*3/uL (ref 0.0–0.7)
Eosinophils Relative: 4 % (ref 0–5)
HCT: 48.7 % (ref 39.0–52.0)
Hemoglobin: 17 g/dL (ref 13.0–17.0)
Lymphocytes Relative: 24 % (ref 12–46)
Lymphs Abs: 1.6 10*3/uL (ref 0.7–4.0)
MCH: 32.1 pg (ref 26.0–34.0)
MCHC: 34.9 g/dL (ref 30.0–36.0)
MCV: 91.9 fL (ref 78.0–100.0)
MONOS PCT: 9 % (ref 3–12)
MPV: 10.4 fL (ref 8.6–12.4)
Monocytes Absolute: 0.6 10*3/uL (ref 0.1–1.0)
NEUTROS ABS: 4 10*3/uL (ref 1.7–7.7)
Neutrophils Relative %: 62 % (ref 43–77)
Platelets: 203 10*3/uL (ref 150–400)
RBC: 5.3 MIL/uL (ref 4.22–5.81)
RDW: 14.1 % (ref 11.5–15.5)
WBC: 6.5 10*3/uL (ref 4.0–10.5)

## 2014-11-06 LAB — BASIC METABOLIC PANEL WITH GFR
BUN: 16 mg/dL (ref 6–23)
CO2: 22 mEq/L (ref 19–32)
Calcium: 9.8 mg/dL (ref 8.4–10.5)
Chloride: 105 mEq/L (ref 96–112)
Creat: 0.8 mg/dL (ref 0.50–1.35)
GFR, Est African American: >89 mL/min
GFR, Est Non African American: >89 mL/min
GLUCOSE: 87 mg/dL (ref 70–99)
POTASSIUM: 4.1 meq/L (ref 3.5–5.3)
Sodium: 140 mEq/L (ref 135–145)

## 2014-11-06 LAB — HEPATIC FUNCTION PANEL
ALK PHOS: 69 U/L (ref 39–117)
ALT: 33 U/L (ref 0–53)
AST: 27 U/L (ref 0–37)
Albumin: 4.6 g/dL (ref 3.5–5.2)
BILIRUBIN DIRECT: 0.2 mg/dL (ref 0.0–0.3)
BILIRUBIN INDIRECT: 0.6 mg/dL (ref 0.2–1.2)
BILIRUBIN TOTAL: 0.8 mg/dL (ref 0.2–1.2)
Total Protein: 7.2 g/dL (ref 6.0–8.3)

## 2014-11-06 LAB — MAGNESIUM: Magnesium: 1.9 mg/dL (ref 1.5–2.5)

## 2014-11-06 LAB — TSH: TSH: 1.092 u[IU]/mL (ref 0.350–4.500)

## 2014-11-06 LAB — HEMOGLOBIN A1C
Hgb A1c MFr Bld: 5.6 % (ref <5.7)
Mean Plasma Glucose: 114 mg/dL (ref <117)

## 2014-11-06 NOTE — Progress Notes (Signed)
Patient ID: [name], male   DOB: [birth year], 69 y.o.   MRN: [id number]  Annual Comprehensive Examination  This very nice 69 y.o. MWM presents for complete physical.  Patient has been followed for HTN, Prediabetes, Hyperlipidemia, and Vitamin D Deficiency.   HTN predates since 2007. Patient's BP has been controlled at home.Today's BP: 136/84 mmHg. Patient denies any cardiac symptoms as chest pain, palpitations, shortness of breath, dizziness or ankle swelling.   Patient's hyperlipidemia is controlled with diet and medications. Patient denies myalgias or other medication SE's. Last lipids were Total Chol 135; HDL 44; LDL 72; Trig 94 on 08/06/2014.   Patient has prediabetes since 2007 with A1c 5.7% and patient denies reactive hypoglycemic symptoms, visual blurring, diabetic polys or paresthesias. Last A1c was 5.3% on 08/06/2014.       Patient does have hx/o elevated PSA and is followed by Dr Vikki Ports. Finally, patient has history of Vitamin D Deficiency of 53 in Nov 2008 and last vitamin D was 49 on 08/06/2014.  Medication Sig  . ALPRAZolam (XANAX) 1 MG tablet Take 1/2 to 1 tablet up to 3 x daily if needed for anxiety or sleep  . aspirin 81 MG tablet Take 81 mg by mouth daily.   . Cholecalciferol (VITAMIN D-3) 5000 UNITS TABS Take 5,000 Units by mouth daily.   . enalapril (VASOTEC) 20 MG tablet TAKE ONE TABLET BY MOUTH DAILY FOR BLOOD PRESSURE  . finasteride (PROSCAR) 5 MG tablet Take 5 mg by mouth daily.  . meloxicam (MOBIC) 15 MG tablet 1 daily for pain with food as needed  . Multiple Vitamin (MULTIVITAMIN) tablet Take 1 tablet by mouth daily.  . rosuvastatin (CRESTOR) 40 MG tablet Take 1/2 to 1 tablet daily as directed for Cholesterol  . tadalafil (CIALIS) 20 MG tablet Take 1/2 to 1 tablet daily or as directed for XXXX  . verapamil (CALAN) 120 MG tablet Take 1 tablet (120 mg total) by mouth at bedtime.  . vitamin C (ASCORBIC ACID) 500 MG tablet Take 500 mg by mouth daily.  .  hyoscyamine (LEVSIN SL) 0.125 MG SL tablet   . RAPAFLO 8 MG CAPS capsule Take 8 mg by mouth daily. with food    Allergies  Allergen Reactions  . Clonazepam Other (See Comments)    "mean"  . Flomax [Tamsulosin Hcl] Other (See Comments)    hypotension  . Lipitor [Atorvastatin] Other (See Comments)    Myalgias   Past Medical History  Diagnosis Date  . Hypertension   . Hyperlipidemia   . BPH (benign prostatic hypertrophy)   . Pelvic pain in male   . Frequency of urination   . Urgency of urination   . Nocturia   . Arthritis   . Mild acid reflux occasional  . Pre-diabetes   . IBS (irritable bowel syndrome)   . Vitamin D deficiency   . Obesity   . Wears glasses    Health Maintenance  Topic Date Due  . INFLUENZA VACCINE  02/09/2015  . PNA vac Low Risk Adult (2 of 2 - PPSV23) 05/02/2015  . TETANUS/TDAP  08/26/2015  . COLONOSCOPY  10/17/2016  . ZOSTAVAX  Completed   Immunization History  Administered Date(s) Administered  . DTaP 07/11/2005  . Influenza Whole 04/02/2013  . Influenza, High Dose Seasonal PF 05/01/2014  . Pneumococcal Conjugate-13 05/01/2014  . Pneumococcal Polysaccharide-23 09/08/2008  . Zoster 12/09/2008   Past Surgical History  Procedure Laterality Date  . Appendectomy  02-07-2007  . Anterior cervical decomp/discectomy fusion  01-25-2002    C4 - C5  . Left inguinal hernia repair  1970  . Umbilical hernia repair  2003  . Left rotator cuff repair  2002  . Anterior cruciate ligament repair  1990    RIGHT  . Cervical fusion  2000    C5 - 6  . Left ankle surg.  1980  . Hip surgery  1999    LEFT HIP --  REMOVAL CALCIUM BUILD-UP  . Colonoscopy  05-30-2011    HEMORRHOIDS  . Tonsillectomy    . Trigger finger release Right 07/17/2013    Procedure: RELEASE A-1 PULLEY RIGHT RING FINGER;  Surgeon: Schuyler Amor, MD;  Location: [facility];  Service: Orthopedics;  Laterality: Right;  . Tendon reconstruction Left 07/17/2013    Procedure: LEFT  ELBOW LATERAL RECONSTRUCTION;  Surgeon: Schuyler Amor, MD;  Location: [facility];  Service: Orthopedics;  Laterality: Left;   Family History  Problem Relation Age of Onset  . Heart disease Mother   . Stroke Mother   . Heart disease Father   . Diabetes Father    History   Social History  . Marital Status: Married    Spouse Name: N/A  . Number of Children: 1  . Years of Education: N/A   Occupational History  . Personnel officer, retired     Social History Main Topics  . Smoking status: Never Smoker   . Smokeless tobacco: Never Used  . Alcohol Use: 2.4 oz/week    4 Standard drinks or equivalent per week     Comment: OCCASIONAL  . Drug Use: No  . Sexual Activity: Active    ROS Constitutional: Denies fever, chills, weight loss/gain, headaches, insomnia,  night sweats or change in appetite. Does c/o fatigue. Eyes: Denies redness, blurred vision, diplopia, discharge, itchy or watery eyes.  ENT: Denies discharge, congestion, post nasal drip, epistaxis, sore throat, earache, hearing loss, dental pain, Tinnitus, Vertigo, Sinus pain or snoring.  Cardio: Denies chest pain, palpitations, irregular heartbeat, syncope, dyspnea, diaphoresis, orthopnea, PND, claudication or edema Respiratory: denies cough, dyspnea, DOE, pleurisy, hoarseness, laryngitis or wheezing.  Gastrointestinal: Denies dysphagia, heartburn, reflux, water brash, pain, cramps, nausea, vomiting, bloating, diarrhea, constipation, hematemesis, melena, hematochezia, jaundice or hemorrhoids Genitourinary: Denies dysuria, frequency, urgency, nocturia, hesitancy, discharge, hematuria or flank pain Musculoskeletal: Denies arthralgia, myalgia, stiffness, Jt. Swelling, pain, limp or strain/sprain. Denies Falls. Skin: Denies puritis, rash, hives, warts, acne, eczema or change in skin lesion Neuro: No weakness, tremor, incoordination, spasms, paresthesia or pain Psychiatric: Denies confusion, memory loss or  sensory loss. Denies Depression. Endocrine: Denies change in weight, skin, hair change, nocturia, and paresthesia, diabetic polys, visual blurring or hyper / hypo glycemic episodes.  Heme/Lymph: No excessive bleeding, bruising or enlarged lymph nodes.  Physical Exam  BP 136/84   Pulse 72  Temp 97.7 F   Resp 16  Ht 6'   Wt 237 lb 3.2 oz     BMI 32.16   General Appearance: Well nourished, in no apparent distress. Eyes: PERRLA, EOMs, conjunctiva no swelling or erythema, normal fundi and vessels. Sinuses: No frontal/maxillary tenderness ENT/Mouth: EACs patent / TMs  nl. Nares clear without erythema, swelling, mucoid exudates. Oral hygiene is good. No erythema, swelling, or exudate. Tongue normal, non-obstructing. Tonsils not swollen or erythematous. Hearing normal.  Neck: Supple, thyroid normal. No bruits, nodes or JVD. Respiratory: Respiratory effort normal.  BS equal and clear bilateral without rales, rhonci, wheezing or stridor. Cardio: Heart sounds are normal with regular  rate and rhythm and no murmurs, rubs or gallops. Peripheral pulses are normal and equal bilaterally without edema. No aortic or femoral bruits. Chest: symmetric with normal excursions and percussion.  Abdomen: Flat, soft, with bowl sounds. Nontender, no guarding, rebound, hernias, masses, or organomegaly.  Lymphatics: Non tender without lymphadenopathy.  Genitourinary:  DRE -deferred to Dr Vikki Ports. Musculoskeletal: Full ROM all peripheral extremities, joint stability, 5/5 strength, and normal gait. Skin: Warm and dry without rashes, lesions, cyanosis, clubbing or  ecchymosis.  Neuro: Cranial nerves intact, reflexes equal bilaterally. Normal muscle tone, no cerebellar symptoms. Sensation intact.  Pysch: Awake and oriented X 3 with normal affect, insight and judgment appropriate.   Assessment and Plan  1. Essential hypertension  - Microalbumin / creatinine urine ratio - EKG 12-Lead - [place], RETROPERITNL ABD,   LTD - TSH  2. Hyperlipidemia  - Lipid panel  3. Pre-diabetes  - Hemoglobin A1c - Insulin, random  4. Vitamin D deficiency  - Vit D  25 hydroxy (rtn osteoporosis monitoring)  5. BPH (benign prostatic hypertrophy)   6. Obesity   7. Screening for rectal cancer   8. Prostate cancer screening   9. Depression screen  - Negative Screen  10. At low risk for fall   11. Medication management  - Urine Microscopic - CBC with Differential/Platelet - BASIC METABOLIC PANEL WITH GFR - Hepatic function panel - Magnesium      Continue prudent diet as discussed, weight control, BP monitoring, regular exercise, and medications as discussed.  Discussed med effects and SE's. Routine screening labs and tests as requested with regular follow-up as recommended. Over 40 minutes of exam, counseling &  chart review was performed

## 2014-11-06 NOTE — Patient Instructions (Signed)
++++++++++++++++++++++++++++++++++  Recommend Low dose or baby Aspirin 81 mg daily   To reduce risk of Colon Cancer 20 %, Skin Cancer 26 % , Melanoma 46% and   Pancreatic cancer 60%  +++++++++++++++++++++++++++++++++ Vitamin D goal is between 70-100.   Please make sure that you are taking your Vitamin D as directed.   It is very important as a natural antiinflammatory   helping hair, skin, and nails, as well as reducing stroke and heart attack risk.   It helps your bones and helps with mood.  It also decreases numerous cancer risks so please take it as directed.   Low Vit D is associated with a 200-300% higher risk for CANCER   and 200-300% higher risk for HEART   ATTACK  &  STROKE.    ...................................................................................  It is also associated with higher death rate at younger ages,   autoimmune diseases like Rheumatoid arthritis, Lupus, Multiple Sclerosis.     Also many other serious conditions, like depression, Alzheimer's  Dementia, infertility, muscle aches, fatigue, fibromyalgia - just to name a few.  ++++++++++++++++++++++++++++++++++++++++ Recommend the book "The END of DIETING" by Dr Joel Fuhrman   & the book "The END of DIABETES " by Dr Joel Fuhrman  At [employer] - get book & Audio CD's     Being diabetic has a  300% increased risk for heart attack, stroke, cancer, and alzheimer- type vascular dementia. It is very important that you work harder with diet by avoiding all foods that are white. Avoid white rice (brown & wild rice is OK), white potatoes (sweetpotatoes in moderation is OK), White bread or wheat bread or anything made out of white flour like bagels, donuts, rolls, buns, biscuits, cakes, pastries, cookies, pizza crust, and pasta (made from white flour & egg whites) - vegetarian pasta or spinach or wheat pasta is OK. Multigrain breads like Arnold's or [employer], or multigrain sandwich thins or  flatbreads.  Diet, exercise and weight loss can reverse and cure diabetes in the early stages.  Diet, exercise and weight loss is very important in the control and prevention of complications of diabetes which affects every system in your body, ie. Brain - dementia/stroke, eyes - glaucoma/blindness, heart - heart attack/heart failure, kidneys - dialysis, stomach - gastric paralysis, intestines - malabsorption, nerves - severe painful neuritis, circulation - gangrene & loss of a leg(s), and finally cancer and Alzheimers.    I recommend avoid fried & greasy foods,  sweets/candy, white rice (brown or wild rice or Quinoa is OK), white potatoes (sweet potatoes are OK) - anything made from white flour - bagels, doughnuts, rolls, buns, biscuits,white and wheat breads, pizza crust and traditional pasta made of white flour & egg white(vegetarian pasta or spinach or wheat pasta is OK).  Multi-grain bread is OK - like multi-grain flat bread or sandwich thins. Avoid alcohol in excess. Exercise is also important.    Eat all the vegetables you want - avoid meat, especially red meat and dairy - especially cheese.  Cheese is the most concentrated form of trans-fats which is the worst thing to clog up our arteries. Veggie cheese is OK which can be found in the fresh produce section at [employer] or [employer] or [employer]  ++++++++++++++++++++++++++++++++++++++++++++++++++++++++ Preventive Care for Adults A healthy lifestyle and preventive care can promote health and wellness. Preventive health guidelines for men include the following key practices:  A routine yearly physical is a good way to check with your health care provider about your   health and preventative screening. It is a chance to share any concerns and updates on your health and to receive a thorough exam.  Visit your dentist for a routine exam and preventative care every 6 months. Brush your teeth twice a day and floss once a day. Good oral hygiene  prevents tooth decay and gum disease.  The frequency of eye exams is based on your age, health, family medical history, use of contact lenses, and other factors. Follow your health care provider's recommendations for frequency of eye exams.  Eat a healthy diet. Foods such as vegetables, fruits, whole grains, low-fat dairy products, and lean protein foods contain the nutrients you need without too many calories. Decrease your intake of foods high in solid fats, added sugars, and salt. Eat the right amount of calories for you.Get information about a proper diet from your health care provider, if necessary.  Regular physical exercise is one of the most important things you can do for your health. Most adults should get at least 150 minutes of moderate-intensity exercise (any activity that increases your heart rate and causes you to sweat) each week. In addition, most adults need muscle-strengthening exercises on 2 or more days a week.  Maintain a healthy weight. The body mass index (BMI) is a screening tool to identify possible weight problems. It provides an estimate of body fat based on height and weight. Your health care provider can find your BMI and can help you achieve or maintain a healthy weight.For adults 20 years and older:  A BMI below 18.5 is considered underweight.  A BMI of 18.5 to 24.9 is normal.  A BMI of 25 to 29.9 is considered overweight.  A BMI of 30 and above is considered obese.  Maintain normal blood lipids and cholesterol levels by exercising and minimizing your intake of saturated fat. Eat a balanced diet with plenty of fruit and vegetables. Blood tests for lipids and cholesterol should begin at age 20 and be repeated every 5 years. If your lipid or cholesterol levels are high, you are over 50, or you are at high risk for heart disease, you may need your cholesterol levels checked more frequently.Ongoing high lipid and cholesterol levels should be treated with medicines if  diet and exercise are not working.  If you smoke, find out from your health care provider how to quit. If you do not use tobacco, do not start.  Lung cancer screening is recommended for adults aged 55-80 years who are at high risk for developing lung cancer because of a history of smoking. A yearly low-dose CT scan of the lungs is recommended for people who have at least a 30-pack-year history of smoking and are a current smoker or have quit within the past 15 years. A pack year of smoking is smoking an average of 1 pack of cigarettes a day for 1 year (for example: 1 pack a day for 30 years or 2 packs a day for 15 years). Yearly screening should continue until the smoker has stopped smoking for at least 15 years. Yearly screening should be stopped for people who develop a health problem that would prevent them from having lung cancer treatment.  If you choose to drink alcohol, do not have more than 2 drinks per day. One drink is considered to be 12 ounces (355 mL) of beer, 5 ounces (148 mL) of wine, or 1.5 ounces (44 mL) of liquor.  Avoid use of street drugs. Do not share needles with anyone. Ask   for help if you need support or instructions about stopping the use of drugs.  High blood pressure causes heart disease and increases the risk of stroke. Your blood pressure should be checked at least every 1-2 years. Ongoing high blood pressure should be treated with medicines, if weight loss and exercise are not effective.  If you are 45-79 years old, ask your health care provider if you should take aspirin to prevent heart disease.  Diabetes screening involves taking a blood sample to check your fasting blood sugar level. Testing should be considered at a younger age or be carried out more frequently if you are overweight and have at least 1 risk factor for diabetes.  Colorectal cancer can be detected and often prevented. Most routine colorectal cancer screening begins at the age of 50 and continues  through age 75. However, your health care provider may recommend screening at an earlier age if you have risk factors for colon cancer. On a yearly basis, your health care provider may provide home test kits to check for hidden blood in the stool. Use of a small camera at the end of a tube to directly examine the colon (sigmoidoscopy or colonoscopy) can detect the earliest forms of colorectal cancer. Talk to your health care provider about this at age 50, when routine screening begins. Direct exam of the colon should be repeated every 5-10 years through age 75, unless early forms of precancerous polyps or small growths are found.  Hepatitis C blood testing is recommended for all people born from 1945 through 1965 and any individual with known risks for hepatitis C.  Screening for abdominal aortic aneurysm (AAA)  by ultrasound is recommended for people who have history of high blood pressure or who are current or former smokers.  Healthy men should  receive prostate-specific antigen (PSA) blood tests as part of routine cancer screening. Talk with your health care provider about prostate cancer screening.  Testicular cancer screening is  recommended for adult males. Screening includes self-exam, a health care provider exam, and other screening tests. Consult with your health care provider about any symptoms you have or any concerns you have about testicular cancer.  Use sunscreen. Apply sunscreen liberally and repeatedly throughout the day. You should seek shade when your shadow is shorter than you. Protect yourself by wearing long sleeves, pants, a wide-brimmed hat, and sunglasses year round, whenever you are outdoors.  Once a month, do a whole-body skin exam, using a mirror to look at the skin on your back. Tell your health care provider about new moles, moles that have irregular borders, moles that are larger than a pencil eraser, or moles that have changed in shape or color.  Stay current with  required vaccines (immunizations).  Influenza vaccine. All adults should be immunized every year.  Tetanus, diphtheria, and acellular pertussis (Td, Tdap) vaccine. An adult who has not previously received Tdap or who does not know his vaccine status should receive 1 dose of Tdap. This initial dose should be followed by tetanus and diphtheria toxoids (Td) booster doses every 10 years. Adults with an unknown or incomplete history of completing a 3-dose immunization series with Td-containing vaccines should begin or complete a primary immunization series including a Tdap dose. Adults should receive a Td booster every 10 years.  Zoster vaccine. One dose is recommended for adults aged 60 years or older unless certain conditions are present.    PREVNAR - Pneumococcal 13-valent conjugate (PCV13) vaccine. When indicated, a person who is   uncertain of his immunization history and has no record of immunization should receive the PCV13 vaccine. An adult aged 19 years or older who has certain medical conditions and has not been previously immunized should receive 1 dose of PCV13 vaccine. This PCV13 should be followed with a dose of pneumococcal polysaccharide (PPSV23) vaccine. The PPSV23 vaccine dose should be obtained at least 8 weeks after the dose of PCV13 vaccine. An adult aged 19 years or older who has certain medical conditions and previously received 1 or more doses of PPSV23 vaccine should receive 1 dose of PCV13. The PCV13 vaccine dose should be obtained 1 or more years after the last PPSV23 vaccine dose.    PNEUMOVAX - Pneumococcal polysaccharide (PPSV23) vaccine. When PCV13 is also indicated, PCV13 should be obtained first. All adults aged 65 years and older should be immunized. An adult younger than age 65 years who has certain medical conditions should be immunized. Any person who resides in a nursing home or long-term care facility should be immunized. An adult smoker should be immunized. People with  an immunocompromised condition and certain other conditions should receive both PCV13 and PPSV23 vaccines. People with human immunodeficiency virus (HIV) infection should be immunized as soon as possible after diagnosis. Immunization during chemotherapy or radiation therapy should be avoided. Routine use of PPSV23 vaccine is not recommended for American Indians, [place] Natives, or people younger than 65 years unless there are medical conditions that require PPSV23 vaccine. When indicated, people who have unknown immunization and have no record of immunization should receive PPSV23 vaccine. One-time revaccination 5 years after the first dose of PPSV23 is recommended for people aged 19-64 years who have chronic kidney failure, nephrotic syndrome, asplenia, or immunocompromised conditions. People who received 1-2 doses of PPSV23 before age 65 years should receive another dose of PPSV23 vaccine at age 65 years or later if at least 5 years have passed since the previous dose. Doses of PPSV23 are not needed for people immunized with PPSV23 at or after age 65 years.    Hepatitis A vaccine. Adults who wish to be protected from this disease, have certain high-risk conditions, work with hepatitis A-infected animals, work in hepatitis A research labs, or travel to or work in countries with a high rate of hepatitis A should be immunized. Adults who were previously unvaccinated and who anticipate close contact with an international adoptee during the first 60 days after arrival in the [place] from a country with a high rate of hepatitis A should be immunized.    Hepatitis B vaccine. Adults should be immunized if they wish to be protected from this disease, have certain high-risk conditions, may be exposed to blood or other infectious body fluids, are household contacts or sex partners of hepatitis B positive people, are clients or workers in certain care facilities, or travel to or work in countries with a high  rate of hepatitis B.   Preventive Service / Frequency   Ages 65 and over  Blood pressure check.  Lipid and cholesterol check.  Lung cancer screening. / Every year if you are aged 55-80 years and have a 30-pack-year history of smoking and currently smoke or have quit within the past 15 years. Yearly screening is stopped once you have quit smoking for at least 15 years or develop a health problem that would prevent you from having lung cancer treatment.  Fecal occult blood test (FOBT) of stool. You may not have to do this test if you get a   colonoscopy every 10 years.  Flexible sigmoidoscopy** or colonoscopy.** / Every 5 years for a flexible sigmoidoscopy or every 10 years for a colonoscopy beginning at age 50 and continuing until age 75.  Hepatitis C blood test.** / For all people born from 1945 through 1965 and any individual with known risks for hepatitis C.  Abdominal aortic aneurysm (AAA) screening./ Screening current or former smokers or have Hypertension.  Skin self-exam. / Monthly.  Influenza vaccine. / Every year.  Tetanus, diphtheria, and acellular pertussis (Tdap/Td) vaccine.** / 1 dose of Td every 10 years.   Zoster vaccine.** / 1 dose for adults aged 60 years or older.         Pneumococcal 13-valent conjugate (PCV13) vaccine.    Pneumococcal polysaccharide (PPSV23) vaccine.     Hepatitis A vaccine.** / Consult your health care provider.  Hepatitis B vaccine.** / Consult your health care provider. Screening for abdominal aortic aneurysm (AAA)  by ultrasound is recommended for people who have history of high blood pressure or who are current or former smokers. 

## 2014-11-07 DIAGNOSIS — M25512 Pain in left shoulder: Secondary | ICD-10-CM | POA: Diagnosis not present

## 2014-11-07 LAB — VITAMIN D 25 HYDROXY (VIT D DEFICIENCY, FRACTURES): Vit D, 25-Hydroxy: 69 ng/mL (ref 30–100)

## 2014-11-07 LAB — URINALYSIS, MICROSCOPIC ONLY
Bacteria, UA: NONE SEEN
CRYSTALS: NONE SEEN
Casts: NONE SEEN
Squamous Epithelial / LPF: NONE SEEN

## 2014-11-07 LAB — MICROALBUMIN / CREATININE URINE RATIO
Creatinine, Urine: 222.2 mg/dL
MICROALB UR: 21.8 mg/dL — AB (ref <2.0)
Microalb Creat Ratio: 98.1 mg/g — ABNORMAL HIGH (ref 0.0–30.0)

## 2014-11-07 LAB — INSULIN, RANDOM: Insulin: 16.4 u[IU]/mL (ref 2.0–19.6)

## 2014-11-12 DIAGNOSIS — M25512 Pain in left shoulder: Secondary | ICD-10-CM | POA: Diagnosis not present

## 2014-11-12 DIAGNOSIS — M542 Cervicalgia: Secondary | ICD-10-CM | POA: Diagnosis not present

## 2014-11-17 DIAGNOSIS — S46012D Strain of muscle(s) and tendon(s) of the rotator cuff of left shoulder, subsequent encounter: Secondary | ICD-10-CM | POA: Diagnosis not present

## 2014-11-17 DIAGNOSIS — M25512 Pain in left shoulder: Secondary | ICD-10-CM | POA: Diagnosis not present

## 2014-11-17 DIAGNOSIS — M6281 Muscle weakness (generalized): Secondary | ICD-10-CM | POA: Diagnosis not present

## 2014-11-24 DIAGNOSIS — S46012D Strain of muscle(s) and tendon(s) of the rotator cuff of left shoulder, subsequent encounter: Secondary | ICD-10-CM | POA: Diagnosis not present

## 2014-11-24 DIAGNOSIS — M6281 Muscle weakness (generalized): Secondary | ICD-10-CM | POA: Diagnosis not present

## 2014-11-24 DIAGNOSIS — M25512 Pain in left shoulder: Secondary | ICD-10-CM | POA: Diagnosis not present

## 2014-11-27 DIAGNOSIS — M6281 Muscle weakness (generalized): Secondary | ICD-10-CM | POA: Diagnosis not present

## 2014-11-27 DIAGNOSIS — M25512 Pain in left shoulder: Secondary | ICD-10-CM | POA: Diagnosis not present

## 2014-11-27 DIAGNOSIS — S46012D Strain of muscle(s) and tendon(s) of the rotator cuff of left shoulder, subsequent encounter: Secondary | ICD-10-CM | POA: Diagnosis not present

## 2014-12-01 DIAGNOSIS — M6281 Muscle weakness (generalized): Secondary | ICD-10-CM | POA: Diagnosis not present

## 2014-12-01 DIAGNOSIS — S46012D Strain of muscle(s) and tendon(s) of the rotator cuff of left shoulder, subsequent encounter: Secondary | ICD-10-CM | POA: Diagnosis not present

## 2014-12-01 DIAGNOSIS — M25512 Pain in left shoulder: Secondary | ICD-10-CM | POA: Diagnosis not present

## 2014-12-04 DIAGNOSIS — S46012D Strain of muscle(s) and tendon(s) of the rotator cuff of left shoulder, subsequent encounter: Secondary | ICD-10-CM | POA: Diagnosis not present

## 2014-12-04 DIAGNOSIS — M25512 Pain in left shoulder: Secondary | ICD-10-CM | POA: Diagnosis not present

## 2014-12-04 DIAGNOSIS — M6281 Muscle weakness (generalized): Secondary | ICD-10-CM | POA: Diagnosis not present

## 2014-12-09 DIAGNOSIS — S46012D Strain of muscle(s) and tendon(s) of the rotator cuff of left shoulder, subsequent encounter: Secondary | ICD-10-CM | POA: Diagnosis not present

## 2014-12-09 DIAGNOSIS — M25512 Pain in left shoulder: Secondary | ICD-10-CM | POA: Diagnosis not present

## 2014-12-09 DIAGNOSIS — M6281 Muscle weakness (generalized): Secondary | ICD-10-CM | POA: Diagnosis not present

## 2014-12-17 DIAGNOSIS — M25512 Pain in left shoulder: Secondary | ICD-10-CM | POA: Diagnosis not present

## 2014-12-17 DIAGNOSIS — M6281 Muscle weakness (generalized): Secondary | ICD-10-CM | POA: Diagnosis not present

## 2014-12-17 DIAGNOSIS — S46012D Strain of muscle(s) and tendon(s) of the rotator cuff of left shoulder, subsequent encounter: Secondary | ICD-10-CM | POA: Diagnosis not present

## 2014-12-22 DIAGNOSIS — M25512 Pain in left shoulder: Secondary | ICD-10-CM | POA: Diagnosis not present

## 2014-12-22 DIAGNOSIS — M6281 Muscle weakness (generalized): Secondary | ICD-10-CM | POA: Diagnosis not present

## 2014-12-22 DIAGNOSIS — S46012D Strain of muscle(s) and tendon(s) of the rotator cuff of left shoulder, subsequent encounter: Secondary | ICD-10-CM | POA: Diagnosis not present

## 2014-12-24 DIAGNOSIS — M25512 Pain in left shoulder: Secondary | ICD-10-CM | POA: Diagnosis not present

## 2014-12-24 DIAGNOSIS — S46012D Strain of muscle(s) and tendon(s) of the rotator cuff of left shoulder, subsequent encounter: Secondary | ICD-10-CM | POA: Diagnosis not present

## 2014-12-24 DIAGNOSIS — M6281 Muscle weakness (generalized): Secondary | ICD-10-CM | POA: Diagnosis not present

## 2014-12-25 DIAGNOSIS — S46012D Strain of muscle(s) and tendon(s) of the rotator cuff of left shoulder, subsequent encounter: Secondary | ICD-10-CM | POA: Diagnosis not present

## 2015-01-02 ENCOUNTER — Other Ambulatory Visit: Payer: Self-pay | Admitting: Physician Assistant

## 2015-02-09 ENCOUNTER — Encounter: Payer: Self-pay | Admitting: Internal Medicine

## 2015-02-09 ENCOUNTER — Ambulatory Visit (INDEPENDENT_AMBULATORY_CARE_PROVIDER_SITE_OTHER): Payer: Medicare Other | Admitting: Internal Medicine

## 2015-02-09 VITALS — BP 118/62 | HR 68 | Temp 98.2°F | Resp 18 | Ht 72.0 in | Wt 243.0 lb

## 2015-02-09 DIAGNOSIS — E785 Hyperlipidemia, unspecified: Secondary | ICD-10-CM | POA: Diagnosis not present

## 2015-02-09 DIAGNOSIS — E559 Vitamin D deficiency, unspecified: Secondary | ICD-10-CM

## 2015-02-09 DIAGNOSIS — I10 Essential (primary) hypertension: Secondary | ICD-10-CM | POA: Diagnosis not present

## 2015-02-09 DIAGNOSIS — R7303 Prediabetes: Secondary | ICD-10-CM

## 2015-02-09 DIAGNOSIS — Z79899 Other long term (current) drug therapy: Secondary | ICD-10-CM | POA: Diagnosis not present

## 2015-02-09 DIAGNOSIS — R7309 Other abnormal glucose: Secondary | ICD-10-CM

## 2015-02-09 LAB — CBC WITH DIFFERENTIAL/PLATELET
Basophils Absolute: 0.1 10*3/uL (ref 0.0–0.1)
Basophils Relative: 1 % (ref 0–1)
Eosinophils Absolute: 0.1 10*3/uL (ref 0.0–0.7)
Eosinophils Relative: 2 % (ref 0–5)
HCT: 49.1 % (ref 39.0–52.0)
HEMOGLOBIN: 17 g/dL (ref 13.0–17.0)
Lymphocytes Relative: 24 % (ref 12–46)
Lymphs Abs: 1.5 10*3/uL (ref 0.7–4.0)
MCH: 32.7 pg (ref 26.0–34.0)
MCHC: 34.6 g/dL (ref 30.0–36.0)
MCV: 94.4 fL (ref 78.0–100.0)
MONO ABS: 0.4 10*3/uL (ref 0.1–1.0)
MPV: 9.9 fL (ref 8.6–12.4)
Monocytes Relative: 6 % (ref 3–12)
Neutro Abs: 4.2 10*3/uL (ref 1.7–7.7)
Neutrophils Relative %: 67 % (ref 43–77)
Platelets: 188 10*3/uL (ref 150–400)
RBC: 5.2 MIL/uL (ref 4.22–5.81)
RDW: 13.8 % (ref 11.5–15.5)
WBC: 6.3 10*3/uL (ref 4.0–10.5)

## 2015-02-09 LAB — TSH: TSH: 1.004 u[IU]/mL (ref 0.350–4.500)

## 2015-02-09 LAB — BASIC METABOLIC PANEL WITH GFR
BUN: 15 mg/dL (ref 7–25)
CALCIUM: 9.7 mg/dL (ref 8.6–10.3)
CO2: 26 mmol/L (ref 20–31)
Chloride: 101 mmol/L (ref 98–110)
Creat: 0.84 mg/dL (ref 0.70–1.25)
GFR, Est Non African American: 89 mL/min (ref >=60)
GLUCOSE: 81 mg/dL (ref 65–99)
Potassium: 4.1 mmol/L (ref 3.5–5.3)
Sodium: 138 mmol/L (ref 135–146)

## 2015-02-09 LAB — MAGNESIUM: Magnesium: 1.9 mg/dL (ref 1.5–2.5)

## 2015-02-09 LAB — HEPATIC FUNCTION PANEL
ALBUMIN: 4.7 g/dL (ref 3.6–5.1)
ALT: 33 U/L (ref 9–46)
AST: 32 U/L (ref 10–35)
Alkaline Phosphatase: 66 U/L (ref 40–115)
Bilirubin, Direct: 0.1 mg/dL (ref <=0.2)
Indirect Bilirubin: 0.6 mg/dL (ref 0.2–1.2)
TOTAL PROTEIN: 7.2 g/dL (ref 6.1–8.1)
Total Bilirubin: 0.7 mg/dL (ref 0.2–1.2)

## 2015-02-09 LAB — LIPID PANEL
Cholesterol: 164 mg/dL (ref 125–200)
HDL: 50 mg/dL (ref >=40)
LDL Cholesterol: 84 mg/dL (ref <130)
Total CHOL/HDL Ratio: 3.3 Ratio (ref <=5.0)
Triglycerides: 148 mg/dL (ref <150)
VLDL: 30 mg/dL (ref <30)

## 2015-02-09 NOTE — Progress Notes (Signed)
Assessment and Plan:  Hypertension:  -if continues to be less than 110/70 than consider d/c enalapril -Continue medication,  -monitor blood pressure at home.  -Continue DASH diet.   -Reminder to go to the ER if any CP, SOB, nausea, dizziness, severe HA, changes vision/speech, left arm numbness and tingling, and jaw pain.  Cholesterol: -Continue diet and exercise.  -Check cholesterol.   Pre-diabetes: -Continue diet and exercise.  -Check A1C  Vitamin D Def: -check level -continue medications.   Continue diet and meds as discussed. Further disposition pending results of labs.  HPI 69 y.o. male  presents for 3 month follow up with hypertension, hyperlipidemia, prediabetes and vitamin D.   His blood pressure has been controlled at home, today their BP is BP: 118/62 mmHg.   He does workout. He denies chest pain, shortness of breath, dizziness.   He is on cholesterol medication and denies myalgias. His cholesterol is at goal. The cholesterol last visit was:   Lab Results  Component Value Date   CHOL 158 11/06/2014   HDL 46 11/06/2014   LDLCALC 93 11/06/2014   TRIG 94 11/06/2014   CHOLHDL 3.4 11/06/2014     He has been working on diet and exercise for prediabetes, and denies foot ulcerations, hyperglycemia, hypoglycemia , increased appetite, nausea, paresthesia of the feet, polydipsia, polyuria, visual disturbances, vomiting and weight loss. Last A1C in the office was:  Lab Results  Component Value Date   HGBA1C 5.6 11/06/2014    Patient is on Vitamin D supplement.  Lab Results  Component Value Date   VD25OH 69 11/06/2014     Patient reports that he has been under increased stress as he recently had to put a family member into a nursing home.  He reports that due to the stress from his signficant other he feels that he has been doing a lot more stress eating.   Current Medications:  Current Outpatient Prescriptions on File Prior to Visit  Medication Sig Dispense Refill   . aspirin 81 MG tablet Take 81 mg by mouth daily.     . Cholecalciferol (VITAMIN D-3) 5000 UNITS TABS Take 5,000 Units by mouth daily.     . enalapril (VASOTEC) 20 MG tablet TAKE ONE TABLET BY MOUTH DAILY FOR BLOOD PRESSURE 90 tablet 1  . finasteride (PROSCAR) 5 MG tablet Take 5 mg by mouth daily.    . meloxicam (MOBIC) 15 MG tablet TAKE 1 TABLET BY MOUTH EVERY DAY AS NEEDED FOR PAIN WITH FOOD AS NEEDED 90 tablet 0  . Multiple Vitamin (MULTIVITAMIN) tablet Take 1 tablet by mouth daily.    . rosuvastatin (CRESTOR) 40 MG tablet Take 1/2 to 1 tablet daily as directed for Cholesterol 90 tablet 1  . tadalafil (CIALIS) 20 MG tablet Take 1/2 to 1 tablet daily or as directed for XXXX 90 tablet 99  . verapamil (CALAN) 120 MG tablet Take 1 tablet (120 mg total) by mouth at bedtime. 90 tablet 3  . vitamin C (ASCORBIC ACID) 500 MG tablet Take 500 mg by mouth daily.     No current facility-administered medications on file prior to visit.    Medical History:  Past Medical History  Diagnosis Date  . Hypertension   . Hyperlipidemia   . BPH (benign prostatic hypertrophy)   . Pelvic pain in male   . Frequency of urination   . Urgency of urination   . Nocturia   . Arthritis   . Mild acid reflux occasional  . Pre-diabetes   .  IBS (irritable bowel syndrome)   . Vitamin D deficiency   . Obesity   . Wears glasses     Allergies:  Allergies  Allergen Reactions  . Clonazepam Other (See Comments)    "mean"  . Flomax [Tamsulosin Hcl] Other (See Comments)    hypotension  . Lipitor [Atorvastatin] Other (See Comments)    Myalgias     Review of Systems:  Review of Systems  Constitutional: Positive for malaise/fatigue. Negative for fever and chills.  HENT: Negative for congestion, ear pain and sore throat.   Eyes: Negative.   Respiratory: Negative for cough, shortness of breath and wheezing.   Cardiovascular: Positive for palpitations. Negative for chest pain and leg swelling.  Gastrointestinal:  Negative for heartburn, diarrhea, constipation, blood in stool and melena.  Genitourinary: Positive for dysuria. Negative for urgency, frequency and hematuria.  Musculoskeletal: Negative.   Skin: Negative.   Neurological: Negative for dizziness, sensory change, loss of consciousness and headaches.  Psychiatric/Behavioral: Negative for depression. The patient is not nervous/anxious and does not have insomnia.     Family history- Review and unchanged  Social history- Review and unchanged  Physical Exam: BP 118/62 mmHg  Pulse 68  Temp(Src) 98.2 F (36.8 C) (Temporal)  Resp 18  Ht 6' (1.829 m)  Wt 243 lb (110.224 kg)  BMI 32.95 kg/m2 Wt Readings from Last 3 Encounters:  02/09/15 243 lb (110.224 kg)  11/06/14 237 lb 3.2 oz (107.593 kg)  08/06/14 239 lb 9.6 oz (108.682 kg)    General Appearance: Well nourished well developed, in no apparent distress. Eyes: PERRLA, EOMs, conjunctiva no swelling or erythema ENT/Mouth: Ear canals normal without obstruction, swelling, erythma, discharge.  TMs normal bilaterally.  Oropharynx moist, clear, without exudate, or postoropharyngeal swelling. Neck: Supple, thyroid normal,no cervical adenopathy  Respiratory: Respiratory effort normal, Breath sounds clear A&P without rhonchi, wheeze, or rale.  No retractions, no accessory usage. Cardio: RRR with no MRGs. Brisk peripheral pulses without edema. Bilateral varicose veins in lower legs.  No calf tenderness to palpation.  No palpable cords. Abdomen: Soft, + BS,  Non tender, no guarding, rebound, hernias, masses. Musculoskeletal: Full ROM, 5/5 strength, Normal gait Skin: Warm, dry without rashes, lesions, ecchymosis.  Neuro: Awake and oriented X 3, Cranial nerves intact. Normal muscle tone, no cerebellar symptoms. Psych: Normal affect, Insight and Judgment appropriate.    Starlyn Skeans, PA-C 2:38 PM [facility]

## 2015-02-10 LAB — INSULIN, RANDOM: INSULIN: 9 u[IU]/mL (ref 2.0–19.6)

## 2015-02-10 LAB — HEMOGLOBIN A1C
Hgb A1c MFr Bld: 5.5 % (ref <5.7)
Mean Plasma Glucose: 111 mg/dL (ref <117)

## 2015-02-10 LAB — VITAMIN D 25 HYDROXY (VIT D DEFICIENCY, FRACTURES): VIT D 25 HYDROXY: 55 ng/mL (ref 30–100)

## 2015-03-10 DIAGNOSIS — M545 Low back pain: Secondary | ICD-10-CM | POA: Diagnosis not present

## 2015-03-10 DIAGNOSIS — M47817 Spondylosis without myelopathy or radiculopathy, lumbosacral region: Secondary | ICD-10-CM | POA: Diagnosis not present

## 2015-03-26 DIAGNOSIS — M545 Low back pain: Secondary | ICD-10-CM | POA: Diagnosis not present

## 2015-03-26 DIAGNOSIS — M47817 Spondylosis without myelopathy or radiculopathy, lumbosacral region: Secondary | ICD-10-CM | POA: Diagnosis not present

## 2015-04-29 DIAGNOSIS — H2513 Age-related nuclear cataract, bilateral: Secondary | ICD-10-CM | POA: Diagnosis not present

## 2015-04-29 DIAGNOSIS — H43813 Vitreous degeneration, bilateral: Secondary | ICD-10-CM | POA: Diagnosis not present

## 2015-04-29 DIAGNOSIS — H524 Presbyopia: Secondary | ICD-10-CM | POA: Diagnosis not present

## 2015-04-29 DIAGNOSIS — H01001 Unspecified blepharitis right upper eyelid: Secondary | ICD-10-CM | POA: Diagnosis not present

## 2015-05-21 ENCOUNTER — Encounter: Payer: Self-pay | Admitting: Internal Medicine

## 2015-05-21 ENCOUNTER — Ambulatory Visit (INDEPENDENT_AMBULATORY_CARE_PROVIDER_SITE_OTHER): Payer: Medicare Other | Admitting: Internal Medicine

## 2015-05-21 VITALS — BP 140/64 | HR 72 | Temp 97.8°F | Resp 18 | Ht 72.0 in | Wt 249.0 lb

## 2015-05-21 DIAGNOSIS — R7309 Other abnormal glucose: Secondary | ICD-10-CM

## 2015-05-21 DIAGNOSIS — Z6833 Body mass index (BMI) 33.0-33.9, adult: Secondary | ICD-10-CM

## 2015-05-21 DIAGNOSIS — R7303 Prediabetes: Secondary | ICD-10-CM

## 2015-05-21 DIAGNOSIS — Z23 Encounter for immunization: Secondary | ICD-10-CM | POA: Diagnosis not present

## 2015-05-21 DIAGNOSIS — I10 Essential (primary) hypertension: Secondary | ICD-10-CM | POA: Diagnosis not present

## 2015-05-21 DIAGNOSIS — E559 Vitamin D deficiency, unspecified: Secondary | ICD-10-CM

## 2015-05-21 DIAGNOSIS — Z79899 Other long term (current) drug therapy: Secondary | ICD-10-CM

## 2015-05-21 DIAGNOSIS — E785 Hyperlipidemia, unspecified: Secondary | ICD-10-CM | POA: Diagnosis not present

## 2015-05-21 LAB — CBC WITH DIFFERENTIAL/PLATELET
BASOS ABS: 0.1 10*3/uL (ref 0.0–0.1)
BASOS PCT: 1 % (ref 0–1)
EOS ABS: 0.3 10*3/uL (ref 0.0–0.7)
Eosinophils Relative: 4 % (ref 0–5)
HCT: 47.7 % (ref 39.0–52.0)
Hemoglobin: 16 g/dL (ref 13.0–17.0)
Lymphocytes Relative: 26 % (ref 12–46)
Lymphs Abs: 2.1 10*3/uL (ref 0.7–4.0)
MCH: 31.5 pg (ref 26.0–34.0)
MCHC: 33.5 g/dL (ref 30.0–36.0)
MCV: 93.9 fL (ref 78.0–100.0)
MPV: 10.4 fL (ref 8.6–12.4)
Monocytes Absolute: 0.6 10*3/uL (ref 0.1–1.0)
Monocytes Relative: 8 % (ref 3–12)
NEUTROS PCT: 61 % (ref 43–77)
Neutro Abs: 4.9 10*3/uL (ref 1.7–7.7)
PLATELETS: 191 10*3/uL (ref 150–400)
RBC: 5.08 MIL/uL (ref 4.22–5.81)
RDW: 13.6 % (ref 11.5–15.5)
WBC: 8.1 10*3/uL (ref 4.0–10.5)

## 2015-05-21 LAB — BASIC METABOLIC PANEL WITH GFR
BUN: 21 mg/dL (ref 7–25)
CO2: 28 mmol/L (ref 20–31)
CREATININE: 0.82 mg/dL (ref 0.70–1.25)
Calcium: 10.1 mg/dL (ref 8.6–10.3)
Chloride: 105 mmol/L (ref 98–110)
GFR, Est Non African American: >89 mL/min (ref >=60)
Glucose, Bld: 89 mg/dL (ref 65–99)
POTASSIUM: 4.2 mmol/L (ref 3.5–5.3)
Sodium: 141 mmol/L (ref 135–146)

## 2015-05-21 LAB — LIPID PANEL
CHOL/HDL RATIO: 3.7 ratio (ref <=5.0)
Cholesterol: 158 mg/dL (ref 125–200)
HDL: 43 mg/dL (ref >=40)
LDL Cholesterol: 80 mg/dL (ref <130)
TRIGLYCERIDES: 177 mg/dL — AB (ref <150)
VLDL: 35 mg/dL — ABNORMAL HIGH (ref <30)

## 2015-05-21 LAB — HEPATIC FUNCTION PANEL
ALBUMIN: 4.5 g/dL (ref 3.6–5.1)
ALT: 32 U/L (ref 9–46)
AST: 26 U/L (ref 10–35)
Alkaline Phosphatase: 67 U/L (ref 40–115)
BILIRUBIN TOTAL: 0.4 mg/dL (ref 0.2–1.2)
Bilirubin, Direct: 0.1 mg/dL (ref <=0.2)
Indirect Bilirubin: 0.3 mg/dL (ref 0.2–1.2)
Total Protein: 6.8 g/dL (ref 6.1–8.1)

## 2015-05-21 LAB — HEMOGLOBIN A1C
Hgb A1c MFr Bld: 5.5 % (ref <5.7)
Mean Plasma Glucose: 111 mg/dL (ref <117)

## 2015-05-21 LAB — TSH: TSH: 1.139 u[IU]/mL (ref 0.350–4.500)

## 2015-05-21 LAB — MAGNESIUM: MAGNESIUM: 2 mg/dL (ref 1.5–2.5)

## 2015-05-21 NOTE — Patient Instructions (Signed)

## 2015-05-22 LAB — INSULIN, RANDOM: INSULIN: 21.5 u[IU]/mL — AB (ref 2.0–19.6)

## 2015-05-22 LAB — VITAMIN D 25 HYDROXY (VIT D DEFICIENCY, FRACTURES): Vit D, 25-Hydroxy: 57 ng/mL (ref 30–100)

## 2015-05-23 NOTE — Progress Notes (Signed)
Patient ID: [name], male   DOB: [birth year], 69 y.o.   MRN: [id number]   This very nice 69 y.o. MWM presents forfollow up with Hypertension, Hyperlipidemia, Pre-Diabetes and Vitamin D Deficiency.    Patient is treated for HTN since 2007 & BP has been controlled at home. Today's BP: 140/64 mmHg. Patient had a negative Cardiolite in 213 by Dr Dannielle Burn. Patient has had no complaints of any cardiac type chest pain, palpitations, dyspnea/orthopnea/PND, dizziness, claudication, or dependent edema.   Hyperlipidemia is controlled with diet & meds. Patient denies myalgias or other med SE's. Current Lipids are at goal with  Cholesterol 158; HDL 43; LDL 80; & sl elevated Triglycerides 177*   Also, the patient has history of PreDiabetes with A1c 5.7% in 2013 and has had no symptoms of reactive hypoglycemia, diabetic polys, paresthesias or visual blurring.  Current A1c is at goal at 5.5%     Further, the patient also has history of Vitamin D Deficiency of 33 in 2008  and supplements vitamin D without any suspected side-effects. Current vitamin D is 57.     Medication Sig  . aspirin 81 MG tablet Take 81 mg by mouth daily.   Marland Kitchen VITAMIN D 5000 UNITS TABS Take 5,000 Units by mouth daily.   . finasteride 5 MG tablet Take 5 mg by mouth daily.  . meloxicam  15 MG tablet TAKE 1 TABLET BY MOUTH EVERY DAY AS NEEDED FOR PAIN WITH FOOD AS NEEDED  . Multiple Vitamin  tablet Take 1 tablet by mouth daily.  . tadalafil  20 MG tablet Take 1/2 to 1 tablet daily or as directed for XXXX  . vitamin C 500 MG tablet Take 500 mg by mouth daily.  . verapamil  120 MG tablet Take 1 tablet (120 mg total) by mouth at bedtime.  . enalapril (VASOTEC) 20 MG tablet Patient not taking: Reported on 05/21/2015)   Allergies  Allergen Reactions  . Clonazepam Other (See Comments)    "mean"  . Flomax [Tamsulosin Hcl] Other (See Comments)    hypotension  . Lipitor [Atorvastatin] Other (See Comments)    Myalgias   PMHx:   Past  Medical History  Diagnosis Date  . Hypertension   . Hyperlipidemia   . BPH (benign prostatic hypertrophy)   . Pelvic pain in male   . Frequency of urination   . Urgency of urination   . Nocturia   . Arthritis   . Mild acid reflux occasional  . Pre-diabetes   . IBS (irritable bowel syndrome)   . Vitamin D deficiency   . Obesity   . Wears glasses    Immunization History  Administered Date(s) Administered  . DTaP 07/11/2005  . Influenza Whole 04/02/2013  . Influenza, High Dose Seasonal PF 05/01/2014, 05/21/2015  . Pneumococcal Conjugate-13 05/01/2014  . Pneumococcal Polysaccharide-23 09/08/2008  . Zoster 12/09/2008   Past Surgical History  Procedure Laterality Date  . Appendectomy  02-07-2007  . Anterior cervical decomp/discectomy fusion  01-25-2002    C4 - C5  . Left inguinal hernia repair  1970  . Umbilical hernia repair  2003  . Left rotator cuff repair  2002  . Anterior cruciate ligament repair  1990    RIGHT  . Cervical fusion  2000    C5 - 6  . Left ankle surg.  1980  . Hip surgery  1999    LEFT HIP --  REMOVAL CALCIUM BUILD-UP  . Colonoscopy  05-30-2011    HEMORRHOIDS  .  Tonsillectomy    . Trigger finger release Right 07/17/2013    Procedure: RELEASE A-1 PULLEY RIGHT RING FINGER;  Surgeon: Schuyler Amor, MD;  Location: [place];  Service: Orthopedics;  Laterality: Right;  . Tendon reconstruction Left 07/17/2013    Procedure: LEFT ELBOW LATERAL RECONSTRUCTION;  Surgeon: Schuyler Amor, MD;  Location: [place];  Service: Orthopedics;  Laterality: Left;   FHx:    Reviewed / unchanged  SHx:    Reviewed / unchanged  Systems Review:  Constitutional: Denies fever, chills, wt changes, headaches, insomnia, fatigue, night sweats, change in appetite. Eyes: Denies redness, blurred vision, diplopia, discharge, itchy, watery eyes.  ENT: Denies discharge, congestion, post nasal drip, epistaxis, sore throat, earache, hearing loss,  dental pain, tinnitus, vertigo, sinus pain, snoring.  CV: Denies chest pain, palpitations, irregular heartbeat, syncope, dyspnea, diaphoresis, orthopnea, PND, claudication or edema. Respiratory: denies cough, dyspnea, DOE, pleurisy, hoarseness, laryngitis, wheezing.  Gastrointestinal: Denies dysphagia, odynophagia, heartburn, reflux, water brash, abdominal pain or cramps, nausea, vomiting, bloating, diarrhea, constipation, hematemesis, melena, hematochezia  or hemorrhoids. Genitourinary: Denies dysuria, frequency, urgency, nocturia, hesitancy, discharge, hematuria or flank pain. Musculoskeletal: Denies arthralgias, myalgias, stiffness, jt. swelling, pain, limping or strain/sprain.  Skin: Denies pruritus, rash, hives, warts, acne, eczema or change in skin lesion(s). Neuro: No weakness, tremor, incoordination, spasms, paresthesia or pain. Psychiatric: Denies confusion, memory loss or sensory loss. Endo: Denies change in weight, skin or hair change.  Heme/Lymph: No excessive bleeding, bruising or enlarged lymph nodes.  Physical Exam  BP 140/64 mmHg  Pulse 72  Temp(Src) 97.8 F (36.6 C) (Temporal)  Resp 18  Ht 6' (1.829 m)  Wt 249 lb (112.946 kg)  BMI 33.76 kg/m2  Appears well nourished and in no distress. Eyes: PERRLA, EOMs, conjunctiva no swelling or erythema. Sinuses: No frontal/maxillary tenderness ENT/Mouth: EAC's clear, TM's nl w/o erythema, bulging. Nares clear w/o erythema, swelling, exudates. Oropharynx clear without erythema or exudates. Oral hygiene is good. Tongue normal, non obstructing. Hearing intact.  Neck: Supple. Thyroid nl. Car 2+/2+ without bruits, nodes or JVD. Chest: Respirations nl with BS clear & equal w/o rales, rhonchi, wheezing or stridor.  Cor: Heart sounds normal w/ regular rate and rhythm without sig. murmurs, gallops, clicks, or rubs. Peripheral pulses normal and equal  without edema.  Abdomen: Soft & bowel sounds normal. Non-tender w/o guarding, rebound,  hernias, masses, or organomegaly.  Lymphatics: Unremarkable.  Musculoskeletal: Full ROM all peripheral extremities, joint stability, 5/5 strength, and normal gait.  Skin: Warm, dry without exposed rashes, lesions or ecchymosis apparent.  Neuro: Cranial nerves intact, reflexes equal bilaterally. Sensory-motor testing grossly intact. Tendon reflexes grossly intact.  Pysch: Alert & oriented x 3.  Insight and judgement nl & appropriate. No ideations.  Assessment and Plan:  1. Essential hypertension  - TSH  2. Hyperlipidemia  - Lipid panel  3. Pre-diabetes  - Hemoglobin A1c - Insulin, random  4. Vitamin D deficiency  - Vit D  25 hydroxy   5. BMI 33.0-33.9,adult   6. Medication management  - CBC with Differential/Platelet - BASIC METABOLIC PANEL WITH GFR - Hepatic function panel - Magnesium  7. Other abnormal glucose  - Hemoglobin A1c - Insulin, random  8. Need for prophylactic vaccination and inoculation against influenza  - Flu vaccine HIGH DOSE PF (Fluzone High dose)   Recommended regular exercise, BP monitoring, weight control, and discussed med and SE's. Recommended labs to assess and monitor clinical status. Further disposition pending results of labs. Over 30  minutes of exam, counseling, chart review was performed

## 2015-05-28 ENCOUNTER — Other Ambulatory Visit: Payer: Self-pay | Admitting: Physician Assistant

## 2015-05-28 ENCOUNTER — Other Ambulatory Visit: Payer: Self-pay | Admitting: Internal Medicine

## 2015-06-21 ENCOUNTER — Other Ambulatory Visit: Payer: Self-pay | Admitting: Internal Medicine

## 2015-06-21 DIAGNOSIS — F411 Generalized anxiety disorder: Secondary | ICD-10-CM

## 2015-06-21 MED ORDER — ALPRAZOLAM 1 MG PO TABS: ORAL_TABLET | ORAL | Status: DC

## 2015-06-22 ENCOUNTER — Other Ambulatory Visit: Payer: Self-pay | Admitting: *Deleted

## 2015-06-22 MED ORDER — TADALAFIL 20 MG PO TABS: ORAL_TABLET | ORAL | Status: DC

## 2015-06-22 MED ORDER — ROSUVASTATIN CALCIUM 40 MG PO TABS: 40.0000 mg | ORAL_TABLET | Freq: Every day | ORAL | Status: DC

## 2015-07-09 DIAGNOSIS — R310 Gross hematuria: Secondary | ICD-10-CM | POA: Diagnosis not present

## 2015-07-09 DIAGNOSIS — R350 Frequency of micturition: Secondary | ICD-10-CM | POA: Diagnosis not present

## 2015-07-09 DIAGNOSIS — N419 Inflammatory disease of prostate, unspecified: Secondary | ICD-10-CM | POA: Diagnosis not present

## 2015-07-14 DIAGNOSIS — M1711 Unilateral primary osteoarthritis, right knee: Secondary | ICD-10-CM | POA: Diagnosis not present

## 2015-07-21 ENCOUNTER — Telehealth: Payer: Self-pay | Admitting: Internal Medicine

## 2015-07-21 NOTE — Telephone Encounter (Signed)
Patient requests reccomendation for ENT from Dr Melford Aase. Per Dr Melford Aase, Dr Radene Journey. Spoke to patient with information. Patient states 3 weeks with some numbness on side of face.  Thank you, Leonie Douglas Referral Coordinator  [facility] [phone] ext. [phone] Fax [phone]

## 2015-07-23 DIAGNOSIS — M542 Cervicalgia: Secondary | ICD-10-CM | POA: Diagnosis not present

## 2015-08-21 ENCOUNTER — Ambulatory Visit: Payer: Self-pay | Admitting: Physician Assistant

## 2015-09-01 ENCOUNTER — Ambulatory Visit: Payer: Self-pay | Admitting: Physician Assistant

## 2015-09-02 DIAGNOSIS — D225 Melanocytic nevi of trunk: Secondary | ICD-10-CM | POA: Diagnosis not present

## 2015-09-02 DIAGNOSIS — L821 Other seborrheic keratosis: Secondary | ICD-10-CM | POA: Diagnosis not present

## 2015-09-02 DIAGNOSIS — Z85828 Personal history of other malignant neoplasm of skin: Secondary | ICD-10-CM | POA: Diagnosis not present

## 2015-09-02 DIAGNOSIS — L859 Epidermal thickening, unspecified: Secondary | ICD-10-CM | POA: Diagnosis not present

## 2015-09-02 DIAGNOSIS — D1801 Hemangioma of skin and subcutaneous tissue: Secondary | ICD-10-CM | POA: Diagnosis not present

## 2015-09-02 DIAGNOSIS — L570 Actinic keratosis: Secondary | ICD-10-CM | POA: Diagnosis not present

## 2015-09-08 ENCOUNTER — Encounter: Payer: Self-pay | Admitting: Physician Assistant

## 2015-09-08 ENCOUNTER — Ambulatory Visit (INDEPENDENT_AMBULATORY_CARE_PROVIDER_SITE_OTHER): Payer: Medicare Other | Admitting: Physician Assistant

## 2015-09-08 VITALS — BP 130/78 | HR 63 | Temp 97.9°F | Resp 16 | Ht 72.0 in | Wt 243.6 lb

## 2015-09-08 DIAGNOSIS — K589 Irritable bowel syndrome without diarrhea: Secondary | ICD-10-CM

## 2015-09-08 DIAGNOSIS — E669 Obesity, unspecified: Secondary | ICD-10-CM | POA: Diagnosis not present

## 2015-09-08 DIAGNOSIS — E785 Hyperlipidemia, unspecified: Secondary | ICD-10-CM | POA: Diagnosis not present

## 2015-09-08 DIAGNOSIS — Z0001 Encounter for general adult medical examination with abnormal findings: Secondary | ICD-10-CM | POA: Diagnosis not present

## 2015-09-08 DIAGNOSIS — R6889 Other general symptoms and signs: Secondary | ICD-10-CM | POA: Diagnosis not present

## 2015-09-08 DIAGNOSIS — R7309 Other abnormal glucose: Secondary | ICD-10-CM | POA: Diagnosis not present

## 2015-09-08 DIAGNOSIS — Z79899 Other long term (current) drug therapy: Secondary | ICD-10-CM

## 2015-09-08 DIAGNOSIS — M5442 Lumbago with sciatica, left side: Secondary | ICD-10-CM

## 2015-09-08 DIAGNOSIS — N400 Enlarged prostate without lower urinary tract symptoms: Secondary | ICD-10-CM

## 2015-09-08 DIAGNOSIS — Z0000 Encounter for general adult medical examination without abnormal findings: Secondary | ICD-10-CM

## 2015-09-08 DIAGNOSIS — E559 Vitamin D deficiency, unspecified: Secondary | ICD-10-CM

## 2015-09-08 DIAGNOSIS — I10 Essential (primary) hypertension: Secondary | ICD-10-CM

## 2015-09-08 LAB — BASIC METABOLIC PANEL WITH GFR
BUN: 12 mg/dL (ref 7–25)
CALCIUM: 9.4 mg/dL (ref 8.6–10.3)
CO2: 23 mmol/L (ref 20–31)
Chloride: 103 mmol/L (ref 98–110)
Creat: 0.75 mg/dL (ref 0.70–1.25)
GFR, Est African American: >89 mL/min (ref >=60)
Glucose, Bld: 91 mg/dL (ref 65–99)
POTASSIUM: 4.4 mmol/L (ref 3.5–5.3)
SODIUM: 138 mmol/L (ref 135–146)

## 2015-09-08 LAB — CBC WITH DIFFERENTIAL/PLATELET
BASOS ABS: 0.1 10*3/uL (ref 0.0–0.1)
BASOS PCT: 1 % (ref 0–1)
Eosinophils Absolute: 0.3 10*3/uL (ref 0.0–0.7)
Eosinophils Relative: 4 % (ref 0–5)
HEMATOCRIT: 47.7 % (ref 39.0–52.0)
HEMOGLOBIN: 16.4 g/dL (ref 13.0–17.0)
LYMPHS PCT: 22 % (ref 12–46)
Lymphs Abs: 1.7 10*3/uL (ref 0.7–4.0)
MCH: 32 pg (ref 26.0–34.0)
MCHC: 34.4 g/dL (ref 30.0–36.0)
MCV: 93.2 fL (ref 78.0–100.0)
MONO ABS: 0.6 10*3/uL (ref 0.1–1.0)
MPV: 10.2 fL (ref 8.6–12.4)
Monocytes Relative: 8 % (ref 3–12)
NEUTROS ABS: 5 10*3/uL (ref 1.7–7.7)
NEUTROS PCT: 65 % (ref 43–77)
Platelets: 189 10*3/uL (ref 150–400)
RBC: 5.12 MIL/uL (ref 4.22–5.81)
RDW: 13.4 % (ref 11.5–15.5)
WBC: 7.7 10*3/uL (ref 4.0–10.5)

## 2015-09-08 LAB — LIPID PANEL
Cholesterol: 153 mg/dL (ref 125–200)
HDL: 51 mg/dL (ref >=40)
LDL CALC: 76 mg/dL (ref <130)
Total CHOL/HDL Ratio: 3 Ratio (ref <=5.0)
Triglycerides: 128 mg/dL (ref <150)
VLDL: 26 mg/dL (ref <30)

## 2015-09-08 LAB — HEPATIC FUNCTION PANEL
ALK PHOS: 76 U/L (ref 40–115)
ALT: 33 U/L (ref 9–46)
AST: 25 U/L (ref 10–35)
Albumin: 4.5 g/dL (ref 3.6–5.1)
BILIRUBIN DIRECT: 0.1 mg/dL (ref <=0.2)
Indirect Bilirubin: 0.5 mg/dL (ref 0.2–1.2)
Total Bilirubin: 0.6 mg/dL (ref 0.2–1.2)
Total Protein: 7 g/dL (ref 6.1–8.1)

## 2015-09-08 LAB — TSH: TSH: 1.17 m[IU]/L (ref 0.40–4.50)

## 2015-09-08 MED ORDER — PREDNISONE 20 MG PO TABS: ORAL_TABLET | ORAL | Status: AC

## 2015-09-08 NOTE — Patient Instructions (Addendum)
Halitosis Halitosis is bad breath. Halitosis may be caused by:  Foods and drinks that you ingest.  Poor oral hygiene.  Medical conditions, such as sinus infections, mouth infections, and diabetes.  Medicines that dry out your mouth.  Smoking. HOME CARE INSTRUCTIONS  Practice good oral hygiene. Do this by:  Flossing every day. Ask your dentist to show you the best way to floss.  Brushing your teeth at least two times each day using toothpaste that is recommended by your dentist. Ask your dentist to show you the best way to brush your teeth.  Brushing your tongue when you brush your teeth. This may help to improve your breath.  Rinsing your mouth one time each day using mouthwash that is recommended by your dentist.  Scheduling and attending regular dental appointments.  Drink enough water to keep your urine clear or pale yellow.  Eat foods that help to keep your teeth clean, such as carrots and celery.  Avoid foods and drinks that can lead to bad breath, such as:  Garlic.  Onions.  Fish.  Coffee.  Alcohol.  Horseradish.  Red meat.  Do not use any tobacco products, including cigarettes, chewing tobacco, or electronic cigarettes. If you need help quitting, ask your health care provider.  Make sure that any mouth devices that you wear, such as a retainer or dentures, are worn and cleaned properly.  If you have a dry mouth, try chewing gum or mints that do not contain sugar. SEEK MEDICAL CARE IF:  You have new symptoms.  Your symptoms get worse or they do not improve with home care.   This information is not intended to replace advice given to you by your health care provider. Make sure you discuss any questions you have with your health care provider.   Document Released: 08/04/2004 Document Revised: 11/11/2014 Document Reviewed: 06/23/2014 Elsevier Interactive Patient Education [employer].

## 2015-09-08 NOTE — Progress Notes (Signed)
MEDICARE ANNUAL WELLNESS VISIT AND FOLLOW UP Assessment:   1. Essential hypertension - CBC with Differential - BASIC METABOLIC PANEL WITH GFR - Hepatic function panel - TSH  2. Pre-diabetes Discussed general issues about diabetes pathophysiology and management., Educational material distributed., Suggested low cholesterol diet., Encouraged aerobic exercise., Discussed foot care., Reminded to get yearly retinal exam. - Hemoglobin A1c - Insulin, fasting  3. BPH (benign prostatic hypertrophy)  4. Encounter for long-term (current) use of other medications - Magnesium  5. Hyperlipidemia - Lipid panel  6. Obesity BMI 31.3) Obesity with co morbidities- long discussion about weight loss, diet, and exercise  7. Vitamin D deficiency Continue supplement  9. Bilateral low back pain with left-sided sciatica Will given prednisone taper, if not better follow up Dr. Noemi Chapel    Plan:   During the course of the visit the patient was educated and counseled about appropriate screening and preventive services including:    Pneumococcal vaccine   Influenza vaccine  Td vaccine  Screening electrocardiogram  Colorectal cancer screening  Diabetes screening  Glaucoma screening  Nutrition counseling   Conditions/risks identified: BMI: Discussed weight loss, diet, and increase physical activity.  Increase physical activity: AHA recommends 150 minutes of physical activity a week.  Medications reviewed Urinary Incontinence is not an issue: discussed non pharmacology and pharmacology options.  Fall risk: low- discussed PT, home fall assessment, medications.    Subjective:  [name] is a 70 y.o. male who presents for Medicare Annual Wellness Visit and 3 month follow up for HTN, hyperlipidemia, and vitamin D Def.  Date of last medicare wellness visit was 01/2014  His blood pressure has been controlled at home, today their BP is BP: 130/78 mmHg He does workout. He denies chest  pain, shortness of breath, dizziness.  He is on cholesterol medication, on crestor and denies myalgias. His cholesterol is at goal. The cholesterol last visit was:   Lab Results  Component Value Date   CHOL 158 05/21/2015   HDL 43 05/21/2015   LDLCALC 80 05/21/2015   TRIG 177* 05/21/2015   CHOLHDL 3.7 05/21/2015    Had elevated A1C in 2013, has done well with diet/exercise. Last A1C in the office was:  Lab Results  Component Value Date   HGBA1C 5.5 05/21/2015   Patient is on Vitamin D supplement.   Lab Results  Component Value Date   VD25OH 57 05/21/2015     Currently on meloxicam and finesteride, off of bASA for recent prostatitis.  Has had lower back pain x 1 week and bilateral hip pain, with some left lower back pain, follows with Dr. Noemi Chapel, had recent injection right knee.   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  BMI is Body mass index is 33.03 kg/(m^2)., he is working on diet and exercise. Wt Readings from Last 3 Encounters:  09/08/15 243 lb 9.6 oz (110.496 kg)  05/21/15 249 lb (112.946 kg)  02/09/15 243 lb (110.224 kg)   Names of Other Physician/Practitioners you currently use: 1. [facility] here for primary care 2. Dr. Sherral Hammers, eye doctor, visit yearly 3. Dr. Aida Puffer, dentist, q 6 months 4. [facility], [place] for prostatitis. Currently on meloxicam and finesteride, off of bASA Patient Care Team: Unk Pinto, MD as PCP - General (Internal Medicine) Charlotte Crumb, MD as Consulting Physician (Orthopedic Surgery) Inda Castle, MD as Consulting Physician (Gastroenterology)  Medication Review: Current Outpatient Prescriptions on  File Prior to Visit  Medication Sig Dispense Refill  . ALPRAZolam (XANAX) 1 MG tablet Take 1/2 TO 1 tab up to 3 x /day if needed for Anxiety or Sleep 90 tablet 5  . aspirin 81 MG tablet Take 81 mg by mouth daily.     . Cholecalciferol  (VITAMIN D-3) 5000 UNITS TABS Take 5,000 Units by mouth daily.     . finasteride (PROSCAR) 5 MG tablet Take 5 mg by mouth daily.    . meloxicam (MOBIC) 15 MG tablet TAKE 1 TABLET BY MOUTH EVERY DAY AS NEEDED FOR PAIN WITH FOOD AS NEEDED 90 tablet 1  . Multiple Vitamin (MULTIVITAMIN) tablet Take 1 tablet by mouth daily.    . rosuvastatin (CRESTOR) 40 MG tablet Take 1 tablet (40 mg total) by mouth daily. 90 tablet 4  . tadalafil (CIALIS) 20 MG tablet Take 1/2 to 1 tablet daily or as directed for XXXX 90 tablet 99  . verapamil (CALAN) 120 MG tablet TAKE 1 TABLET (120 MG TOTAL) BY MOUTH AT BEDTIME. 90 tablet 1  . vitamin C (ASCORBIC ACID) 500 MG tablet Take 500 mg by mouth daily.     No current facility-administered medications on file prior to visit.    Current Problems (verified) Patient Active Problem List   Diagnosis Date Noted  . BMI 33.0-33.9,adult 05/21/2015  . Medication management 09/30/2013  . Obesity 09/30/2013  . Hypertension   . Hyperlipidemia   . BPH (benign prostatic hypertrophy)   . Pre-diabetes   . IBS (irritable bowel syndrome)   . Vitamin D deficiency     Screening Tests Health Maintenance  Topic Date Due  . Hepatitis C Screening  [birth year]  . PNA vac Low Risk Adult (2 of 2 - PPSV23) 05/02/2015  . TETANUS/TDAP  08/26/2015  . INFLUENZA VACCINE  02/09/2016  . COLONOSCOPY  10/17/2016  . ZOSTAVAX  Completed    Immunization History  Administered Date(s) Administered  . DTaP 07/11/2005  . Influenza Whole 04/02/2013  . Influenza, High Dose Seasonal PF 05/01/2014, 05/21/2015  . Pneumococcal Conjugate-13 05/01/2014  . Pneumococcal Polysaccharide-23 09/08/2008  . Zoster 12/09/2008    Preventative care: Last colonoscopy: 2008 Dr. Deatra Ina due 2018 Stress test: 2014  Prior vaccinations: TD or Tdap: 2007  DUE, wants at CPE Influenza: 2016 Pneumococcal: 2010 Prevnar 13: 2015 Shingles/Zostavax: 2010  History reviewed: allergies, current medications, past  family history, past medical history, past social history, past surgical history and problem list Allergies Allergies  Allergen Reactions  . Clonazepam Other (See Comments)    "mean"  . Flomax [Tamsulosin Hcl] Other (See Comments)    hypotension  . Lipitor [Atorvastatin] Other (See Comments)    Myalgias   Surgical history Past Surgical History  Procedure Laterality Date  . Appendectomy  02-07-2007  . Anterior cervical decomp/discectomy fusion  01-25-2002    C4 - C5  . Left inguinal hernia repair  1970  . Umbilical hernia repair  2003  . Left rotator cuff repair  2002  . Anterior cruciate ligament repair  1990    RIGHT  . Cervical fusion  2000    C5 - 6  . Left ankle surg.  1980  . Hip surgery  1999    LEFT HIP --  REMOVAL CALCIUM BUILD-UP  . Colonoscopy  05-30-2011    HEMORRHOIDS  . Tonsillectomy    . Trigger finger release Right 07/17/2013    Procedure: RELEASE A-1 PULLEY RIGHT RING FINGER;  Surgeon: Schuyler Amor, MD;  Location: [facility];  Service: Orthopedics;  Laterality: Right;  . Tendon reconstruction Left 07/17/2013    Procedure: LEFT ELBOW LATERAL RECONSTRUCTION;  Surgeon: Schuyler Amor, MD;  Location: [facility];  Service: Orthopedics;  Laterality: Left;   Family history Family History  Problem Relation Age of Onset  . Heart disease Mother   . Stroke Mother   . Heart disease Father   . Diabetes Father    Risk Factors: Tobacco Social History  Substance Use Topics  . Smoking status: Never Smoker   . Smokeless tobacco: Never Used  . Alcohol Use: 2.4 oz/week    4 Standard drinks or equivalent per week     Comment: OCCASIONAL   He does not smoke.  Patient is not a former smoker. Are there smokers in your home (other than you)?  No  Alcohol Current alcohol use: social drinker  Caffeine Current caffeine use: coffee 2 /day  Exercise Current exercise: walking  Nutrition/Diet Current diet: in general, a "healthy"  diet    Cardiac risk factors: advanced age (older than 79 for men, 67 for women), dyslipidemia, hypertension, male gender, obesity (BMI >= 30 kg/m2) and sedentary lifestyle.  Depression Screen (Note: if answer to either of the following is "Yes", a more complete depression screening is indicated)   Q1: Over the past two weeks, have you felt down, depressed or hopeless? No  Q2: Over the past two weeks, have you felt little interest or pleasure in doing things? No  Have you lost interest or pleasure in daily life? No  Do you often feel hopeless? No  Do you cry easily over simple problems? No  Activities of Daily Living In your present state of health, do you have any difficulty performing the following activities?:  Driving? No Managing money?  No Feeding yourself? No Getting from bed to chair? No Climbing a flight of stairs? No Preparing food and eating?: No Bathing or showering? No Getting dressed: No Getting to the toilet? No Using the toilet:No Moving around from place to place: No In the past year have you fallen or had a near fall?:No   Are you sexually active?  No  Do you have more than one partner?  No  Vision Difficulties: No  Hearing Difficulties: No Do you often ask people to speak up or repeat themselves? No Do you experience ringing or noises in your ears? No Do you have difficulty understanding soft or whispered voices? No  Cognition  Do you feel that you have a problem with memory?No  Do you often misplace items? No  Do you feel safe at home?  Yes  Advanced directives Does patient have a [place]? Yes Does patient have a Living Will? Yes   Objective:   Blood pressure 130/78, pulse 63, temperature 97.9 F (36.6 C), temperature source Tympanic, resp. rate 16, height 6' (1.829 m), weight 243 lb 9.6 oz (110.496 kg), SpO2 97 %. Body mass index is 33.03 kg/(m^2).  General appearance: alert, no distress, WD/WN, male Cognitive  Testing  Alert? Yes  Normal Appearance?Yes  Oriented to person? Yes  Place? Yes   Time? Yes  Recall of three objects?  Yes  Can perform simple calculations? Yes  Displays appropriate judgment?Yes  Can read the correct time from a watch face?Yes  HEENT: normocephalic, sclerae anicteric, TMs pearly, nares patent, no discharge or erythema, pharynx normal Oral cavity: MMM, no lesions Neck: supple, no lymphadenopathy, no thyromegaly, no masses Heart: RRR, normal  S1, S2, no murmurs Lungs: CTA bilaterally, no wheezes, rhonchi, or rales Abdomen: +bs, soft, non tender, non distended, no masses, no hepatomegaly, no splenomegaly Musculoskeletal: nontender, no swelling, no obvious deformity Extremities: no edema, no cyanosis, no clubbing Pulses: 2+ symmetric, upper and lower extremities, normal cap refill Neurological: alert, oriented x 3, CN2-12 intact, strength normal upper extremities and lower extremities, sensation normal throughout, DTRs 2+ throughout, no cerebellar signs, gait normal Psychiatric: normal affect, behavior normal, pleasant   Medicare Attestation I have personally reviewed: The patient's medical and social history Their use of alcohol, tobacco or illicit drugs Their current medications and supplements The patient's functional ability including ADLs,fall risks, home safety risks, cognitive, and hearing and visual impairment Diet and physical activities Evidence for depression or mood disorders  The patient's weight, height, BMI, and visual acuity have been recorded in the chart.  I have made referrals, counseling, and provided education to the patient based on review of the above and I have provided the patient with a written personalized care plan for preventive services.     Vicie Mutters, PA-C   09/08/2015

## 2015-09-14 ENCOUNTER — Telehealth: Payer: Self-pay

## 2015-09-14 NOTE — Telephone Encounter (Signed)
SPOKE WITH PT ABOUT HIS LAB RESULTS & TOLD HIM THEY WERE NORMAL. PT VOICED UNDERSTANDING.

## 2015-11-20 ENCOUNTER — Encounter: Payer: Self-pay | Admitting: Internal Medicine

## 2015-12-01 ENCOUNTER — Ambulatory Visit (INDEPENDENT_AMBULATORY_CARE_PROVIDER_SITE_OTHER): Payer: Medicare Other | Admitting: Physician Assistant

## 2015-12-01 ENCOUNTER — Ambulatory Visit (HOSPITAL_COMMUNITY)
Admission: RE | Admit: 2015-12-01 | Discharge: 2015-12-01 | Disposition: A | Discharge status: Home / Self Care | Payer: Medicare Other | Source: Ambulatory Visit | Attending: Physician Assistant | Admitting: Physician Assistant

## 2015-12-01 ENCOUNTER — Encounter: Payer: Self-pay | Admitting: Physician Assistant

## 2015-12-01 VITALS — BP 136/70 | HR 64 | Temp 97.3°F | Resp 16 | Ht 72.0 in | Wt 231.4 lb

## 2015-12-01 DIAGNOSIS — R059 Cough, unspecified: Secondary | ICD-10-CM

## 2015-12-01 DIAGNOSIS — R05 Cough: Secondary | ICD-10-CM | POA: Diagnosis not present

## 2015-12-01 DIAGNOSIS — R062 Wheezing: Secondary | ICD-10-CM | POA: Insufficient documentation

## 2015-12-01 DIAGNOSIS — R918 Other nonspecific abnormal finding of lung field: Secondary | ICD-10-CM | POA: Insufficient documentation

## 2015-12-01 MED ORDER — PREDNISONE 20 MG PO TABS: ORAL_TABLET | ORAL | Status: DC

## 2015-12-01 MED ORDER — PROMETHAZINE-CODEINE 6.25-10 MG/5ML PO SYRP: 5.0000 mL | ORAL_SOLUTION | Freq: Four times a day (QID) | ORAL | Status: DC | PRN

## 2015-12-01 MED ORDER — AZITHROMYCIN 250 MG PO TABS: ORAL_TABLET | ORAL | Status: AC

## 2015-12-01 NOTE — Patient Instructions (Signed)
Can do samples steroid inhaler if do not tolerate oral steroids, do 1 puff twice a day and wash mouth out afterwards to avoid yeast.    Community-Acquired Pneumonia, Adult Pneumonia is an infection of the lungs. One type of pneumonia can happen while a person is in a hospital. A different type can happen when a person is not in a hospital (community-acquired pneumonia). It is easy for this kind to spread from person to person. It can spread to you if you breathe near an infected person who coughs or sneezes. Some symptoms include:  A dry cough.  A wet (productive) cough.  Fever.  Sweating.  Chest pain. HOME CARE  Take over-the-counter and prescription medicines only as told by your doctor.  Only take cough medicine if you are losing sleep.  If you were prescribed an antibiotic medicine, take it as told by your doctor. Do not stop taking the antibiotic even if you start to feel better.  Sleep with your head and neck raised (elevated). You can do this by putting a few pillows under your head, or you can sleep in a recliner.  Do not use tobacco products. These include cigarettes, chewing tobacco, and e-cigarettes. If you need help quitting, ask your doctor.  Drink enough water to keep your pee (urine) clear or pale yellow. A shot (vaccine) can help prevent pneumonia. Shots are often suggested for:  People older than 70 years of age.  People older than 70 years of age:  Who are having cancer treatment.  Who have long-term (chronic) lung disease.  Who have problems with their body's defense system (immune system). You may also prevent pneumonia if you take these actions:  Get the flu (influenza) shot every year.  Go to the dentist as often as told.  Wash your hands often. If soap and water are not available, use hand sanitizer. GET HELP IF:  You have a fever.  You lose sleep because your cough medicine does not help. GET HELP RIGHT AWAY IF:  You are short of breath and  it gets worse.  You have more chest pain.  Your sickness gets worse. This is very serious if:  You are an older adult.  Your body's defense system is weak.  You cough up blood.   This information is not intended to replace advice given to you by your health care provider. Make sure you discuss any questions you have with your health care provider.   Document Released: 12/14/2007 Document Revised: 03/18/2015 Document Reviewed: 10/22/2014 Elsevier Interactive Patient Education 2016 [place] COUGH AND COLD SYMPTOMS:  -Symptoms usually last at least 1 week with the worst symptoms being around day 4.  - colds usually start with a sore throat and end with a cough, and the cough can take 2 weeks to get better.  -No antibiotics are needed for colds, flu, sore throats, cough, bronchitis UNLESS symptoms are longer than 7 days OR if you are getting better then get drastically worse.  -There are a lot of combination medications (Dayquil, Nyquil, Vicks 44, tyelnol cold and sinus, ETC). Please look at the ingredients on the back so that you are treating the correct symptoms and not doubling up on medications/ingredients.    Medicines you can use  Nasal congestion  - pseudoephedrine (Sudafed)- behind the counter, do not use if you have high blood pressure, medicine that have -D in them.  - phenylephrine (Sudafed PE) -Dextormethorphan + chlorpheniramine (Coridcidin HBP)- okay  if you have high blood pressure -Oxymetazoline (Afrin) nasal spray- LIMIT to 3 days -Saline nasal spray -Neti pot (used distilled or bottled water)  Ear pain/congestion  -pseudoephedrine (sudafed) - Nasonex/flonase nasal spray  Fever  -Acetaminophen (Tyelnol) -Ibuprofen (Advil, motrin, aleve)  Sore Throat  -Acetaminophen (Tyelnol) -Ibuprofen (Advil, motrin, aleve) -Drink a lot of water -Gargle with salt water - Rest your voice (don't talk) -Throat sprays -Cough drops  Body  Aches  -Acetaminophen (Tyelnol) -Ibuprofen (Advil, motrin, aleve)  Headache  -Acetaminophen (Tyelnol) -Ibuprofen (Advil, motrin, aleve) - Exedrin, Exedrin Migraine  Allergy symptoms (cough, sneeze, runny nose, itchy eyes) -Claritin or loratadine cheapest but likely the weakest  -Zyrtec or certizine at night because it can make you sleepy -The strongest is allegra or fexafinadine  Cheapest at [employer], [employer], [employer]  Cough  -Dextromethorphan (Delsym)- medicine that has DM in it -Guafenesin (Mucinex/Robitussin) - cough drops - drink lots of water  Chest Congestion  -Guafenesin (Mucinex/Robitussin)  Red Itchy Eyes  - Naphcon-A  Upset Stomach  - Bland diet (nothing spicy, greasy, fried, and high acid foods like tomatoes, oranges, berries) -OKAY- cereal, bread, soup, crackers, rice -Eat smaller more frequent meals -reduce caffeine, no alcohol -Loperamide (Imodium-AD) if diarrhea -Prevacid for heart burn  General health when sick  -Hydration -wash your hands frequently -keep surfaces clean -change pillow cases and sheets often -Get fresh air but do not exercise strenuously -Vitamin D, double up on it - Vitamin C -Zinc

## 2015-12-01 NOTE — Progress Notes (Signed)
Subjective:    Patient ID: [name], male    DOB: 1945/10/16, 70 y.o.   MRN: [id number]  HPI 70 y.o. WM presents with not feeling well x 1 week. Wife had pneumonia LLL viral, being treated and being set up for CT scan. He has been on levaquin since yesterday and states that he is not doing better, RX cough has been using his wife's cough syrup that helped. He is lethargic, achy, wheezing, cough with clear sputum, no fever, chills.   Blood pressure 136/70, pulse 64, temperature 97.3 F (36.3 C), temperature source Temporal, resp. rate 16, height 6' (1.829 m), weight 231 lb 6.4 oz (104.962 kg).  Past Medical History  Diagnosis Date  . Hypertension   . Hyperlipidemia   . BPH (benign prostatic hypertrophy)   . Pelvic pain in male   . Frequency of urination   . Urgency of urination   . Nocturia   . Arthritis   . Mild acid reflux occasional  . Pre-diabetes   . IBS (irritable bowel syndrome)   . Vitamin D deficiency   . Obesity   . Wears glasses    Current Outpatient Prescriptions on File Prior to Visit  Medication Sig Dispense Refill  . ALPRAZolam (XANAX) 1 MG tablet Take 1/2 TO 1 tab up to 3 x /day if needed for Anxiety or Sleep 90 tablet 5  . aspirin 81 MG tablet Take 81 mg by mouth daily.     . Cholecalciferol (VITAMIN D-3) 5000 UNITS TABS Take 5,000 Units by mouth daily.     . finasteride (PROSCAR) 5 MG tablet Take 5 mg by mouth daily.    . meloxicam (MOBIC) 15 MG tablet TAKE 1 TABLET BY MOUTH EVERY DAY AS NEEDED FOR PAIN WITH FOOD AS NEEDED 90 tablet 1  . Multiple Vitamin (MULTIVITAMIN) tablet Take 1 tablet by mouth daily.    . rosuvastatin (CRESTOR) 40 MG tablet Take 1 tablet (40 mg total) by mouth daily. 90 tablet 4  . tadalafil (CIALIS) 20 MG tablet Take 1/2 to 1 tablet daily or as directed for XXXX 90 tablet 99  . verapamil (CALAN) 120 MG tablet TAKE 1 TABLET (120 MG TOTAL) BY MOUTH AT BEDTIME. 90 tablet 1  . vitamin C (ASCORBIC ACID) 500 MG tablet Take 500 mg by  mouth daily.     No current facility-administered medications on file prior to visit.  '  Review of Systems  Constitutional: Negative for fever, chills and diaphoresis.  HENT: Positive for postnasal drip, rhinorrhea and sore throat. Negative for congestion, ear pain, sinus pressure, sneezing, trouble swallowing and voice change.   Eyes: Negative.   Respiratory: Positive for cough and wheezing. Negative for chest tightness and shortness of breath.   Cardiovascular: Negative.   Gastrointestinal: Negative.   Genitourinary: Negative.   Musculoskeletal: Negative.  Negative for neck pain.  Neurological: Negative.  Negative for headaches.       Objective:   Physical Exam  Constitutional: He is oriented to person, place, and time. He appears well-developed and well-nourished.  HENT:  Head: Normocephalic and atraumatic.  Right Ear: External ear normal.  Left Ear: External ear normal.  Nose: Nose normal.  Mouth/Throat: Oropharynx is clear and moist.  Eyes: Conjunctivae are normal. Pupils are equal, round, and reactive to light.  Neck: Normal range of motion. Neck supple.  Cardiovascular: Normal rate, regular rhythm and normal heart sounds.   No murmur heard. Pulmonary/Chest: Effort normal. No respiratory distress. He has wheezes (LLL).  He has no rales. He exhibits no tenderness.  Abdominal: Soft. Bowel sounds are normal.  Lymphadenopathy:    He has no cervical adenopathy.  Neurological: He is alert and oriented to person, place, and time.  Skin: Skin is warm and dry.       Assessment & Plan:  1. Cough - azithromycin (ZITHROMAX) 250 MG tablet; Take 2 tablets (500 mg) on  Day 1,  followed by 1 tablet (250 mg) once daily on Days 2 through 5.  Dispense: 6 each; Refill: 1 - predniSONE (DELTASONE) 20 MG tablet; 2 tablets daily for 3 days, 1 tablet daily for 4 days.  Dispense: 10 tablet; Refill: 0 - promethazine-codeine (PHENERGAN WITH CODEINE) 6.25-10 MG/5ML syrup; Take 5 mLs by mouth  every 6 (six) hours as needed for cough. Max: 71mL per day  Dispense: 240 mL; Refill: 0 - DG Chest 2 View; Future

## 2015-12-02 ENCOUNTER — Telehealth: Payer: Self-pay

## 2015-12-02 NOTE — Telephone Encounter (Signed)
Spoke with pt about his CXR & told him what he needed to do & pt's states he will talk more to Vincent when he comes in for his follow up visit.

## 2015-12-16 ENCOUNTER — Encounter: Payer: Self-pay | Admitting: Internal Medicine

## 2015-12-16 ENCOUNTER — Ambulatory Visit (INDEPENDENT_AMBULATORY_CARE_PROVIDER_SITE_OTHER): Payer: Medicare Other | Admitting: Internal Medicine

## 2015-12-16 VITALS — BP 116/80 | HR 80 | Temp 97.8°F | Resp 16 | Ht 72.0 in | Wt 237.2 lb

## 2015-12-16 DIAGNOSIS — E785 Hyperlipidemia, unspecified: Secondary | ICD-10-CM | POA: Diagnosis not present

## 2015-12-16 DIAGNOSIS — Z23 Encounter for immunization: Secondary | ICD-10-CM

## 2015-12-16 DIAGNOSIS — Z136 Encounter for screening for cardiovascular disorders: Secondary | ICD-10-CM

## 2015-12-16 DIAGNOSIS — E559 Vitamin D deficiency, unspecified: Secondary | ICD-10-CM

## 2015-12-16 DIAGNOSIS — Z79899 Other long term (current) drug therapy: Secondary | ICD-10-CM

## 2015-12-16 DIAGNOSIS — I10 Essential (primary) hypertension: Secondary | ICD-10-CM

## 2015-12-16 DIAGNOSIS — N400 Enlarged prostate without lower urinary tract symptoms: Secondary | ICD-10-CM | POA: Diagnosis not present

## 2015-12-16 DIAGNOSIS — R7309 Other abnormal glucose: Secondary | ICD-10-CM

## 2015-12-16 DIAGNOSIS — Z1212 Encounter for screening for malignant neoplasm of rectum: Secondary | ICD-10-CM

## 2015-12-16 NOTE — Patient Instructions (Signed)

## 2015-12-16 NOTE — Progress Notes (Signed)
Patient ID: [name], male   DOB: 29-Dec-1945, 70 y.o.   MRN: [id number]   [facility] ADULT & ADOLESCENT INTERNAL MEDICINE   Unk Pinto, M.D.    Uvaldo Bristle. Silverio Lay, P.A.-C      Starlyn Skeans, P.A.-C   [facility]                [address] [place], [place] [id number] Telephone [phone] Telefax [phone] _________________________________  Comprehensive Evaluation & Examination     This very nice 70 y.o. MWM presents for a comprehensive evaluation and management of multiple medical co-morbidities.  Patient has been followed for HTN, Prediabetes, Hyperlipidemia and Vitamin D Deficiency. Patient has hx/o BPH and elevated PSA and is followed by Dr McDiarmid.     HTN predates since 2007. Patient's BP has been controlled at home.Today's BP: 116/80 mmHg. In 2009 he had a negative Cardiolite. Patient denies any cardiac symptoms as chest pain, palpitations, shortness of breath, dizziness or ankle swelling.     Patient's hyperlipidemia is controlled with diet and medications. Patient denies myalgias or other medication SE's. Last lipids were at goal with Cholesterol 153; HDL 51; LDL 76; Triglycerides 128 on 09/08/2015.     Patient has prediabetes with A1c 5.7% in 2013 and patient denies reactive hypoglycemic symptoms, visual blurring, diabetic polys or paresthesias. Last A1c was improved at goal with A1c 5.5% on 05/21/2015.     Finally, patient has history of Vitamin D Deficiency of "73 in 2008 and last vitamin D was 57 on 05/21/2015.   Medication Sig  . ALPRAZolam  1 MG Take 1/2 TO 1 tab up to 3 x /day if needed for Anxiety or Sleep  . aspirin 81 MG  Take 81 mg by mouth daily.   Marland Kitchen VITAMIN 5000 UNITS  Take 5,000 Units by mouth daily.   . finasteride  5 MG  Take 5 mg by mouth daily.  . meloxicam  15 MG  TAKE 1 TAB EVERY DAY AS NEEDED FOR PAIN  . Multiple Vitamin   Take 1 tablet by mouth daily.  . Rosuvastatin 40 MG Take 1 tab  daily.  . tadalafil  20 MG Take 1/2 to 1 tablet daily or as directed for XXXX  . verapamil  120 MG TAKE 1 TAB AT BEDTIME.  . vitamin C  500 MG  Take 500 mg by mouth daily.   Allergies  Allergen Reactions  . Clonazepam Other (See Comments)    "mean"  . Flomax [Tamsulosin Hcl] Other (See Comments)    hypotension  . Lipitor [Atorvastatin] Other (See Comments)    Myalgias   Past Medical History  Diagnosis Date  . Hypertension   . Hyperlipidemia   . BPH (benign prostatic hypertrophy)   . Pelvic pain in male   . Frequency of urination   . Urgency of urination   . Nocturia   . Arthritis   . Mild acid reflux occasional  . Pre-diabetes   . IBS (irritable bowel syndrome)   . Vitamin D deficiency   . Obesity   . Wears glasses    Health Maintenance  Topic Date Due  . Hepatitis C Screening  February 01, 1946  . PNA vac Low Risk Adult (2 of 2 - PPSV23) 05/02/2015  . TETANUS/TDAP  08/26/2015  . INFLUENZA VACCINE  02/09/2016  . COLONOSCOPY  10/17/2016  . ZOSTAVAX  Completed   Immunization History  Administered Date(s) Administered  . DTaP 07/11/2005  . Influenza Whole 04/02/2013  . Influenza, High Dose Seasonal PF 05/01/2014, 05/21/2015  . Pneumococcal Conjugate-13 05/01/2014  . Pneumococcal Polysaccharide-23 09/08/2008  . Zoster 12/09/2008   Past Surgical History  Procedure Laterality Date  . Appendectomy  02-07-2007  . Anterior cervical decomp/discectomy fusion  01-25-2002    C4 - C5  . Left inguinal hernia repair  1970  . Umbilical hernia repair  2003  . Left rotator cuff repair  2002  . Anterior cruciate ligament repair  1990    RIGHT  . Cervical fusion  2000    C5 - 6  . Left ankle surg.  1980  . Hip surgery  1999    LEFT HIP --  REMOVAL CALCIUM BUILD-UP  . Colonoscopy  05-30-2011    HEMORRHOIDS  . Tonsillectomy    . Trigger finger release Right 07/17/2013    Procedure: RELEASE A-1 PULLEY RIGHT RING FINGER;  Surgeon: Schuyler Amor, MD  . Tendon reconstruction  Left 07/17/2013    Procedure: LEFT ELBOW LATERAL RECONSTRUCTION;  Surgeon: Schuyler Amor, MD   Family History  Problem Relation Age of Onset  . Heart disease Mother   . Stroke Mother   . Heart disease Father   . Diabetes Father     Social History   Social History  . Marital Status: Married    Spouse Name: N/A  . Number of Children: 1  . Years of Education: N/A   Occupational History  . Business Consultant    Social History Main Topics  . Smoking status: Never Smoker   . Smokeless tobacco: Never Used  . Alcohol Use: 2.4 oz/week    4 Standard drinks or equivalent per week     Comment: OCCASIONAL  . Drug Use: No  . Sexual Activity: Not on file    ROS Constitutional: Denies fever, chills, weight loss/gain, headaches, insomnia,  night sweats or change in appetite. Does c/o fatigue. Eyes: Denies redness, blurred vision, diplopia, discharge, itchy or watery eyes.  ENT: Denies discharge, congestion, post nasal drip, epistaxis, sore throat, earache, hearing loss, dental pain, Tinnitus, Vertigo, Sinus pain or snoring.  Cardio: Denies chest pain, palpitations, irregular heartbeat, syncope, dyspnea, diaphoresis, orthopnea, PND, claudication or edema Respiratory: denies cough, dyspnea, DOE, pleurisy, hoarseness, laryngitis or wheezing.  Gastrointestinal: Denies dysphagia, heartburn, reflux, water brash, pain, cramps, nausea, vomiting, bloating, diarrhea, constipation, hematemesis, melena, hematochezia, jaundice or hemorrhoids Genitourinary: Denies dysuria, frequency, urgency, nocturia, hesitancy, discharge, hematuria or flank pain Musculoskeletal: Denies arthralgia, myalgia, stiffness, Jt. Swelling, pain, limp or strain/sprain. Denies Falls. Skin: Denies puritis, rash, hives, warts, acne, eczema or change in skin lesion Neuro: No weakness, tremor, incoordination, spasms, paresthesia or pain Psychiatric: Denies confusion, memory loss or sensory loss. Denies Depression. Endocrine:  Denies change in weight, skin, hair change, nocturia, and paresthesia, diabetic polys, visual blurring or hyper / hypo glycemic episodes.  Heme/Lymph: No excessive bleeding, bruising or enlarged lymph nodes.  Physical Exam  BP 116/80 mmHg  Pulse 80  Temp(Src) 97.8 F (36.6 C)  Resp 16  Ht 6' (1.829 m)  Wt 237 lb 3.2 oz (107.593 kg)  BMI 32.16 kg/m2  General Appearance: Well nourished, in no apparent distress. Eyes: PERRLA, EOMs, conjunctiva no swelling or erythema, normal fundi and vessels. Sinuses: No frontal/maxillary tenderness ENT/Mouth: EACs patent / TMs  nl. Nares clear without erythema, swelling, mucoid exudates. Oral hygiene is good. No erythema, swelling, or exudate. Tongue normal, non-obstructing. Tonsils not swollen or erythematous.  Hearing normal.  Neck: Supple, thyroid normal. No bruits, nodes or JVD. Respiratory: Respiratory effort normal.  BS equal and clear bilateral without rales, rhonci, wheezing or stridor. Cardio: Heart sounds are normal with regular rate and rhythm and no murmurs, rubs or gallops. Peripheral pulses are normal and equal bilaterally without edema. No aortic or femoral bruits. Chest: symmetric with normal excursions and percussion.  Abdomen: Soft, with Nl bowel sounds. Nontender, no guarding, rebound, hernias, masses, or organomegaly.  Lymphatics: Non tender without lymphadenopathy.  Genitourinary: Deferred to Dr McDiarmid Musculoskeletal: Full ROM all peripheral extremities, joint stability, 5/5 strength, and normal gait. Skin: Warm and dry without rashes, lesions, cyanosis, clubbing or  ecchymosis.  Neuro: Cranial nerves intact, reflexes equal bilaterally. Normal muscle tone, no cerebellar symptoms. Sensation intact.  Pysch: Alert and oriented X 3 with normal affect, insight and judgment appropriate.   Assessment and Plan  1. Essential hypertension  - Microalbumin / creatinine urine ratio - EKG 12-Lead - [place], RETROPERITNL ABD,  LTD - TSH  2.  Hyperlipidemia  - Lipid panel - TSH  3. Other abnormal glucose  - Hemoglobin A1c - Insulin, random  4. Vitamin D deficiency  - VITAMIN D 25 Hydroxy   5. BPH (benign prostatic hypertrophy)  - PSA  6. Screening for rectal cancer  - POC Hemoccult Bld/Stl   7. Medication management  - Urinalysis, Routine w reflex microscopic - CBC with Differential/Platelet - BASIC METABOLIC PANEL WITH GFR - Hepatic function panel - Magnesium   Continue prudent diet as discussed, weight control, BP monitoring, regular exercise, and medications as discussed.  Discussed med effects and SE's. Routine screening labs and tests as requested with regular follow-up as recommended. Over 40 minutes of exam, counseling, chart review and high complex critical decision making was performed

## 2015-12-21 ENCOUNTER — Other Ambulatory Visit: Payer: Medicare Other

## 2015-12-21 ENCOUNTER — Other Ambulatory Visit: Payer: Self-pay | Admitting: Internal Medicine

## 2015-12-21 DIAGNOSIS — R7309 Other abnormal glucose: Secondary | ICD-10-CM | POA: Diagnosis not present

## 2015-12-21 DIAGNOSIS — I10 Essential (primary) hypertension: Secondary | ICD-10-CM

## 2015-12-21 DIAGNOSIS — E559 Vitamin D deficiency, unspecified: Secondary | ICD-10-CM | POA: Diagnosis not present

## 2015-12-21 DIAGNOSIS — Z125 Encounter for screening for malignant neoplasm of prostate: Secondary | ICD-10-CM | POA: Diagnosis not present

## 2015-12-21 DIAGNOSIS — Z79899 Other long term (current) drug therapy: Secondary | ICD-10-CM

## 2015-12-21 DIAGNOSIS — R7303 Prediabetes: Secondary | ICD-10-CM | POA: Diagnosis not present

## 2015-12-21 DIAGNOSIS — N320 Bladder-neck obstruction: Secondary | ICD-10-CM | POA: Diagnosis not present

## 2015-12-21 DIAGNOSIS — E785 Hyperlipidemia, unspecified: Secondary | ICD-10-CM

## 2015-12-21 LAB — CBC WITH DIFFERENTIAL/PLATELET
Basophils Absolute: 81 cells/uL (ref 0–200)
Basophils Relative: 1 %
EOS PCT: 5 %
Eosinophils Absolute: 405 cells/uL (ref 15–500)
HCT: 47 % (ref 38.5–50.0)
Hemoglobin: 15.8 g/dL (ref 13.2–17.1)
LYMPHS PCT: 20 %
Lymphs Abs: 1620 cells/uL (ref 850–3900)
MCH: 30.9 pg (ref 27.0–33.0)
MCHC: 33.6 g/dL (ref 32.0–36.0)
MCV: 92 fL (ref 80.0–100.0)
MONOS PCT: 8 %
MPV: 10.2 fL (ref 7.5–12.5)
Monocytes Absolute: 648 cells/uL (ref 200–950)
NEUTROS PCT: 66 %
Neutro Abs: 5346 cells/uL (ref 1500–7800)
PLATELETS: 182 10*3/uL (ref 140–400)
RBC: 5.11 MIL/uL (ref 4.20–5.80)
RDW: 13.1 % (ref 11.0–15.0)
WBC: 8.1 10*3/uL (ref 3.8–10.8)

## 2015-12-21 LAB — HEMOGLOBIN A1C
Hgb A1c MFr Bld: 5.5 % (ref <5.7)
Mean Plasma Glucose: 111 mg/dL

## 2015-12-21 LAB — TSH: TSH: 0.81 mIU/L (ref 0.40–4.50)

## 2015-12-22 LAB — BASIC METABOLIC PANEL WITH GFR
BUN: 15 mg/dL (ref 7–25)
CALCIUM: 9.4 mg/dL (ref 8.6–10.3)
CO2: 20 mmol/L (ref 20–31)
Chloride: 108 mmol/L (ref 98–110)
Creat: 0.83 mg/dL (ref 0.70–1.25)
GFR, Est African American: >89 mL/min (ref >=60)
GLUCOSE: 92 mg/dL (ref 65–99)
Potassium: 4.1 mmol/L (ref 3.5–5.3)
SODIUM: 145 mmol/L (ref 135–146)

## 2015-12-22 LAB — URINALYSIS, ROUTINE W REFLEX MICROSCOPIC
Bilirubin Urine: NEGATIVE
Glucose, UA: NEGATIVE
HGB URINE DIPSTICK: NEGATIVE
KETONES UR: NEGATIVE
LEUKOCYTES UA: NEGATIVE
NITRITE: NEGATIVE
Specific Gravity, Urine: 1.03 (ref 1.001–1.035)
pH: 5.5 (ref 5.0–8.0)

## 2015-12-22 LAB — LIPID PANEL
CHOL/HDL RATIO: 3.2 ratio (ref <=5.0)
CHOLESTEROL: 161 mg/dL (ref 125–200)
HDL: 51 mg/dL (ref >=40)
LDL Cholesterol: 78 mg/dL (ref <130)
TRIGLYCERIDES: 158 mg/dL — AB (ref <150)
VLDL: 32 mg/dL — AB (ref <30)

## 2015-12-22 LAB — PSA: PSA: 5.63 ng/mL — AB (ref <=4.00)

## 2015-12-22 LAB — HEPATIC FUNCTION PANEL
ALT: 25 U/L (ref 9–46)
AST: 25 U/L (ref 10–35)
Albumin: 4.2 g/dL (ref 3.6–5.1)
Alkaline Phosphatase: 72 U/L (ref 40–115)
BILIRUBIN INDIRECT: 0.4 mg/dL (ref 0.2–1.2)
Bilirubin, Direct: 0.1 mg/dL (ref <=0.2)
TOTAL PROTEIN: 6.3 g/dL (ref 6.1–8.1)
Total Bilirubin: 0.5 mg/dL (ref 0.2–1.2)

## 2015-12-22 LAB — URINALYSIS, MICROSCOPIC ONLY
Bacteria, UA: NONE SEEN [HPF]
Casts: NONE SEEN [LPF]
SQUAMOUS EPITHELIAL / LPF: NONE SEEN [HPF] (ref <=5)
Yeast: NONE SEEN [HPF]

## 2015-12-22 LAB — VITAMIN D 25 HYDROXY (VIT D DEFICIENCY, FRACTURES): VIT D 25 HYDROXY: 68 ng/mL (ref 30–100)

## 2015-12-22 LAB — MICROALBUMIN / CREATININE URINE RATIO
CREATININE, URINE: 221 mg/dL (ref 20–370)
MICROALB UR: 11.4 mg/dL — AB
Microalb Creat Ratio: 52 mcg/mg creat — ABNORMAL HIGH (ref <30)

## 2015-12-22 LAB — MAGNESIUM: MAGNESIUM: 1.8 mg/dL (ref 1.5–2.5)

## 2015-12-22 LAB — INSULIN, RANDOM: Insulin: 26.8 u[IU]/mL — ABNORMAL HIGH (ref 2.0–19.6)

## 2016-01-04 DIAGNOSIS — N401 Enlarged prostate with lower urinary tract symptoms: Secondary | ICD-10-CM | POA: Diagnosis not present

## 2016-01-04 DIAGNOSIS — R350 Frequency of micturition: Secondary | ICD-10-CM | POA: Diagnosis not present

## 2016-01-04 DIAGNOSIS — N5201 Erectile dysfunction due to arterial insufficiency: Secondary | ICD-10-CM | POA: Diagnosis not present

## 2016-01-25 DIAGNOSIS — M75111 Incomplete rotator cuff tear or rupture of right shoulder, not specified as traumatic: Secondary | ICD-10-CM | POA: Diagnosis not present

## 2016-02-16 DIAGNOSIS — R972 Elevated prostate specific antigen [PSA]: Secondary | ICD-10-CM | POA: Diagnosis not present

## 2016-02-16 DIAGNOSIS — M25511 Pain in right shoulder: Secondary | ICD-10-CM | POA: Diagnosis not present

## 2016-02-22 DIAGNOSIS — N401 Enlarged prostate with lower urinary tract symptoms: Secondary | ICD-10-CM | POA: Diagnosis not present

## 2016-02-22 DIAGNOSIS — R350 Frequency of micturition: Secondary | ICD-10-CM | POA: Diagnosis not present

## 2016-02-22 DIAGNOSIS — N410 Acute prostatitis: Secondary | ICD-10-CM | POA: Diagnosis not present

## 2016-03-01 DIAGNOSIS — M6281 Muscle weakness (generalized): Secondary | ICD-10-CM | POA: Diagnosis not present

## 2016-03-01 DIAGNOSIS — S46011D Strain of muscle(s) and tendon(s) of the rotator cuff of right shoulder, subsequent encounter: Secondary | ICD-10-CM | POA: Diagnosis not present

## 2016-03-01 DIAGNOSIS — M25511 Pain in right shoulder: Secondary | ICD-10-CM | POA: Diagnosis not present

## 2016-03-03 DIAGNOSIS — S46011D Strain of muscle(s) and tendon(s) of the rotator cuff of right shoulder, subsequent encounter: Secondary | ICD-10-CM | POA: Diagnosis not present

## 2016-03-03 DIAGNOSIS — M25511 Pain in right shoulder: Secondary | ICD-10-CM | POA: Diagnosis not present

## 2016-03-03 DIAGNOSIS — M6281 Muscle weakness (generalized): Secondary | ICD-10-CM | POA: Diagnosis not present

## 2016-03-07 DIAGNOSIS — S46011D Strain of muscle(s) and tendon(s) of the rotator cuff of right shoulder, subsequent encounter: Secondary | ICD-10-CM | POA: Diagnosis not present

## 2016-03-07 DIAGNOSIS — M6281 Muscle weakness (generalized): Secondary | ICD-10-CM | POA: Diagnosis not present

## 2016-03-07 DIAGNOSIS — M25511 Pain in right shoulder: Secondary | ICD-10-CM | POA: Diagnosis not present

## 2016-03-15 DIAGNOSIS — H1089 Other conjunctivitis: Secondary | ICD-10-CM | POA: Diagnosis not present

## 2016-03-17 ENCOUNTER — Other Ambulatory Visit: Payer: Self-pay | Admitting: Internal Medicine

## 2016-03-18 ENCOUNTER — Other Ambulatory Visit: Payer: Self-pay | Admitting: Internal Medicine

## 2016-03-21 ENCOUNTER — Ambulatory Visit: Payer: Self-pay | Admitting: Internal Medicine

## 2016-03-29 ENCOUNTER — Ambulatory Visit (INDEPENDENT_AMBULATORY_CARE_PROVIDER_SITE_OTHER): Payer: Medicare Other | Admitting: Internal Medicine

## 2016-03-29 ENCOUNTER — Encounter: Payer: Self-pay | Admitting: Internal Medicine

## 2016-03-29 VITALS — BP 118/74 | HR 68 | Temp 98.0°F | Resp 18 | Ht 72.0 in | Wt 236.0 lb

## 2016-03-29 DIAGNOSIS — E559 Vitamin D deficiency, unspecified: Secondary | ICD-10-CM

## 2016-03-29 DIAGNOSIS — E785 Hyperlipidemia, unspecified: Secondary | ICD-10-CM | POA: Diagnosis not present

## 2016-03-29 DIAGNOSIS — Z23 Encounter for immunization: Secondary | ICD-10-CM

## 2016-03-29 DIAGNOSIS — R7309 Other abnormal glucose: Secondary | ICD-10-CM

## 2016-03-29 DIAGNOSIS — M12511 Traumatic arthropathy, right shoulder: Secondary | ICD-10-CM | POA: Diagnosis not present

## 2016-03-29 DIAGNOSIS — I10 Essential (primary) hypertension: Secondary | ICD-10-CM

## 2016-03-29 DIAGNOSIS — H1013 Acute atopic conjunctivitis, bilateral: Secondary | ICD-10-CM | POA: Diagnosis not present

## 2016-03-29 DIAGNOSIS — N400 Enlarged prostate without lower urinary tract symptoms: Secondary | ICD-10-CM | POA: Diagnosis not present

## 2016-03-29 DIAGNOSIS — M12811 Other specific arthropathies, not elsewhere classified, right shoulder: Secondary | ICD-10-CM

## 2016-03-29 DIAGNOSIS — Z79899 Other long term (current) drug therapy: Secondary | ICD-10-CM

## 2016-03-29 NOTE — Progress Notes (Signed)
Assessment and Plan:  Hypertension:  -Continue medication,  -monitor blood pressure at home.  -Continue DASH diet.   -Reminder to go to the ER if any CP, SOB, nausea, dizziness, severe HA, changes vision/speech, left arm numbness and tingling, and jaw pain.  Cholesterol: -Continue diet and exercise.   Pre-diabetes: -Continue diet and exercise.    Vitamin D Def: -continue medications.   Right rotator cuff arthropathy -likely will need surgical repair  Allergic conjunctivitis -flonase -cont allergy drops -consider using alloway OTC once prescription drops gone  BPH -undergoing urodynamics to determine the level of obstruction with Dr. Gaynelle Arabian next week  Continue diet and meds as discussed. Further disposition pending results of labs.  HPI 70 y.o. male  presents for 3 month follow up with hypertension, hyperlipidemia, prediabetes and vitamin D.   His blood pressure has been controlled at home, today their BP is BP: 118/74.   He does not workout. He denies chest pain, shortness of breath, dizziness.   He is on cholesterol medication and denies myalgias. His cholesterol is at goal. The cholesterol last visit was:   Lab Results  Component Value Date   CHOL 161 12/21/2015   HDL 51 12/21/2015   LDLCALC 78 12/21/2015   TRIG 158 (H) 12/21/2015   CHOLHDL 3.2 12/21/2015     He has been working on diet and exercise for prediabetes, and denies foot ulcerations, hyperglycemia, hypoglycemia , increased appetite, nausea, paresthesia of the feet, polydipsia, polyuria, visual disturbances, vomiting and weight loss. Last A1C in the office was:  Lab Results  Component Value Date   HGBA1C 5.5 12/21/2015    Patient is on Vitamin D supplement.  Lab Results  Component Value Date   VD25OH 68 12/21/2015      Patient reports that while he was at the beach he had a lot of swelling in his eyes.  He reports that the opthalmologist told him he was having some allergy issues.  He reports  that he is doing some warm compresses.    He reports that he is seeing Dr. Noemi Chapel for a right sided rotator cuff issues.  He is currently doing physical but he thinks that he is likely going to need surgery for it.    He reports that he is taking xanax only a half tablet at nighttime.  He reports that he cannot sleep without it.    He is due to see Dr. Satira Sark for a bladder test.  He reports that they are evaluating this for his enlarged prostate.    Current Medications:  Current Outpatient Prescriptions on File Prior to Visit  Medication Sig Dispense Refill  . ALPRAZolam (XANAX) 1 MG tablet Take 1/2 TO 1 tab up to 3 x /day if needed for Anxiety or Sleep 90 tablet 5  . aspirin 81 MG tablet Take 81 mg by mouth daily.     . Cholecalciferol (VITAMIN D-3) 5000 UNITS TABS Take 5,000 Units by mouth daily.     . finasteride (PROSCAR) 5 MG tablet Take 5 mg by mouth daily.    . meloxicam (MOBIC) 15 MG tablet TAKE 1 TABLET BY MOUTH EVERY DAY AS NEEDED FOR PAIN WITH FOOD AS NEEDED 90 tablet 1  . Multiple Vitamin (MULTIVITAMIN) tablet Take 1 tablet by mouth daily.    . rosuvastatin (CRESTOR) 40 MG tablet Take 1 tablet (40 mg total) by mouth daily. 90 tablet 4  . tadalafil (CIALIS) 20 MG tablet Take 1/2 to 1 tablet daily or as directed for  XXXX 90 tablet 99  . verapamil (CALAN) 120 MG tablet TAKE 1 TABLET BY MOUTH AT BEDTIME 90 tablet 0  . vitamin C (ASCORBIC ACID) 500 MG tablet Take 500 mg by mouth daily.     No current facility-administered medications on file prior to visit.     Medical History:  Past Medical History:  Diagnosis Date  . Arthritis   . BPH (benign prostatic hypertrophy)   . Frequency of urination   . Hyperlipidemia   . Hypertension   . IBS (irritable bowel syndrome)   . Mild acid reflux occasional  . Nocturia   . Obesity   . Pelvic pain in male   . Pre-diabetes   . Urgency of urination   . Vitamin D deficiency   . Wears glasses     Allergies:  Allergies  Allergen  Reactions  . Clonazepam Other (See Comments)    "mean"  . Flomax [Tamsulosin Hcl] Other (See Comments)    hypotension  . Lipitor [Atorvastatin] Other (See Comments)    Myalgias     Review of Systems:  Review of Systems  Constitutional: Negative for chills, fever and malaise/fatigue.  HENT: Negative for congestion, ear pain and sore throat.   Eyes: Negative.   Respiratory: Negative for cough, shortness of breath and wheezing.   Cardiovascular: Negative for chest pain, palpitations and leg swelling.  Gastrointestinal: Negative for abdominal pain, blood in stool, constipation, diarrhea, heartburn and melena.  Genitourinary: Negative.   Skin: Negative.   Neurological: Negative for dizziness, sensory change, loss of consciousness and headaches.  Psychiatric/Behavioral: Negative for depression. The patient is not nervous/anxious and does not have insomnia.     Family history- Review and unchanged  Social history- Review and unchanged  Physical Exam: BP 118/74   Pulse 68   Temp 98 F (36.7 C) (Temporal)   Resp 18   Ht 6' (1.829 m)   Wt 236 lb (107 kg)   BMI 32.01 kg/m  Wt Readings from Last 3 Encounters:  03/29/16 236 lb (107 kg)  12/16/15 237 lb 3.2 oz (107.6 kg)  12/01/15 231 lb 6.4 oz (105 kg)    General Appearance: Well nourished well developed, in no apparent distress. Eyes: PERRLA, EOMs, conjunctiva no swelling or erythema ENT/Mouth: Ear canals normal without obstruction, swelling, erythma, discharge.  TMs normal bilaterally.  Oropharynx moist, clear, without exudate, or postoropharyngeal swelling. Neck: Supple, thyroid normal,no cervical adenopathy  Respiratory: Respiratory effort normal, Breath sounds clear A&P without rhonchi, wheeze, or rale.  No retractions, no accessory usage. Cardio: RRR with no MRGs. Brisk peripheral pulses without edema.  Abdomen: Soft, + BS,  Non tender, no guarding, rebound, hernias, masses. Musculoskeletal: Full ROM, 5/5 strength, Normal  gait Skin: Warm, dry without rashes, lesions, ecchymosis.  Neuro: Awake and oriented X 3, Cranial nerves intact. Normal muscle tone, no cerebellar symptoms. Psych: Normal affect, Insight and Judgment appropriate.    Starlyn Skeans, PA-C 9:57 AM [facility]

## 2016-04-07 DIAGNOSIS — M1711 Unilateral primary osteoarthritis, right knee: Secondary | ICD-10-CM | POA: Diagnosis not present

## 2016-04-12 DIAGNOSIS — L814 Other melanin hyperpigmentation: Secondary | ICD-10-CM | POA: Diagnosis not present

## 2016-04-12 DIAGNOSIS — L821 Other seborrheic keratosis: Secondary | ICD-10-CM | POA: Diagnosis not present

## 2016-04-12 DIAGNOSIS — L570 Actinic keratosis: Secondary | ICD-10-CM | POA: Diagnosis not present

## 2016-04-12 DIAGNOSIS — Z85828 Personal history of other malignant neoplasm of skin: Secondary | ICD-10-CM | POA: Diagnosis not present

## 2016-04-12 DIAGNOSIS — D485 Neoplasm of uncertain behavior of skin: Secondary | ICD-10-CM | POA: Diagnosis not present

## 2016-04-12 DIAGNOSIS — D1801 Hemangioma of skin and subcutaneous tissue: Secondary | ICD-10-CM | POA: Diagnosis not present

## 2016-04-15 DIAGNOSIS — H01002 Unspecified blepharitis right lower eyelid: Secondary | ICD-10-CM | POA: Diagnosis not present

## 2016-04-15 DIAGNOSIS — H04123 Dry eye syndrome of bilateral lacrimal glands: Secondary | ICD-10-CM | POA: Diagnosis not present

## 2016-04-15 DIAGNOSIS — H01001 Unspecified blepharitis right upper eyelid: Secondary | ICD-10-CM | POA: Diagnosis not present

## 2016-04-15 DIAGNOSIS — H01004 Unspecified blepharitis left upper eyelid: Secondary | ICD-10-CM | POA: Diagnosis not present

## 2016-04-20 DIAGNOSIS — N5201 Erectile dysfunction due to arterial insufficiency: Secondary | ICD-10-CM | POA: Diagnosis not present

## 2016-04-20 DIAGNOSIS — N401 Enlarged prostate with lower urinary tract symptoms: Secondary | ICD-10-CM | POA: Diagnosis not present

## 2016-04-20 DIAGNOSIS — R351 Nocturia: Secondary | ICD-10-CM | POA: Diagnosis not present

## 2016-04-20 DIAGNOSIS — R339 Retention of urine, unspecified: Secondary | ICD-10-CM | POA: Diagnosis not present

## 2016-04-26 ENCOUNTER — Other Ambulatory Visit: Payer: Self-pay | Admitting: Internal Medicine

## 2016-04-28 ENCOUNTER — Ambulatory Visit (INDEPENDENT_AMBULATORY_CARE_PROVIDER_SITE_OTHER): Payer: Medicare Other | Admitting: Internal Medicine

## 2016-04-28 VITALS — BP 138/76 | HR 80 | Temp 98.2°F | Resp 18 | Ht 72.0 in | Wt 242.0 lb

## 2016-04-28 DIAGNOSIS — R809 Proteinuria, unspecified: Secondary | ICD-10-CM

## 2016-04-28 DIAGNOSIS — M75100 Unspecified rotator cuff tear or rupture of unspecified shoulder, not specified as traumatic: Secondary | ICD-10-CM

## 2016-04-28 NOTE — Patient Instructions (Signed)
Please let us know when you are going to bring back your 24 hour urine sample back.  Please keep the 24 hour urine sample in the fridge while you are doing the test.

## 2016-04-28 NOTE — Progress Notes (Signed)
   Subjective:    Patient ID: [name], male    DOB: Nov 15, 1945, 70 y.o.   MRN: [id number]  HPI  Patient presents to the office for preoperative appointment and also for evaluation of 1+ proteinuria.  He reports that he was being seen by Dr. Marinus Maw for enlarged prostate and likely eventual need for radical prostatectomy.  He was told to come to the office for evaluation for the proteinuria.  He is due to have a rotator cuff repaired by Dr. Noemi Chapel in 2 weeks.  He wants to be cleared for surgery.       Review of Systems  Constitutional: Negative for chills and fever.  HENT: Negative for congestion, ear pain and sore throat.   Eyes: Negative.   Respiratory: Negative for cough, shortness of breath and wheezing.   Cardiovascular: Negative for chest pain, palpitations and leg swelling.  Gastrointestinal: Negative for abdominal pain, blood in stool, constipation and diarrhea.  Genitourinary: Negative.   Skin: Negative.   Neurological: Negative for dizziness and headaches.  Psychiatric/Behavioral: The patient is not nervous/anxious.        Objective:   Physical Exam  Constitutional: He is oriented to person, place, and time. He appears well-developed and well-nourished. No distress.  HENT:  Head: Normocephalic.  Mouth/Throat: Oropharynx is clear and moist. No oropharyngeal exudate.  Eyes: Conjunctivae are normal. No scleral icterus.  Neck: Normal range of motion. Neck supple. No JVD present. No thyromegaly present.  Cardiovascular: Normal rate, regular rhythm, normal heart sounds and intact distal pulses.  Exam reveals no gallop and no friction rub.   No murmur heard. Pulmonary/Chest: Effort normal and breath sounds normal. No respiratory distress. He has no wheezes. He has no rales. He exhibits no tenderness.  Abdominal: Soft. Bowel sounds are normal. He exhibits no distension and no mass. There is no tenderness. There is no rebound and no guarding.  Musculoskeletal: Normal  range of motion.  Lymphadenopathy:    He has no cervical adenopathy.  Neurological: He is alert and oriented to person, place, and time. No cranial nerve deficit. Coordination normal.  Skin: Skin is warm and dry. No rash noted. He is not diaphoretic.  Psychiatric: He has a normal mood and affect. His behavior is normal. Judgment and thought content normal.  Nursing note and vitals reviewed.   Vitals:   04/28/16 1526  BP: 138/76  Pulse: 80  Resp: 18  Temp: 98.2 F (36.8 C)          Assessment & Plan:    1. Proteinuria, unspecified type -likely secondary to dehydration, will need retesting - 24hr Total Protein; Future - Urinalysis, Routine w reflex microscopic (not at [facility]) - Culture, Urine - Microalbumin / creatinine urine ratio  2. Rotator cuff syndrome, unspecified laterality -cleared for surgery -will need to avoid tricyclic muscle relaxers

## 2016-04-29 LAB — MICROALBUMIN / CREATININE URINE RATIO
CREATININE, URINE: 110 mg/dL (ref 20–370)
MICROALB UR: 11.1 mg/dL
MICROALB/CREAT RATIO: 101 ug/mg{creat} — AB (ref <30)

## 2016-04-29 LAB — URINALYSIS, ROUTINE W REFLEX MICROSCOPIC
Bilirubin Urine: NEGATIVE
Glucose, UA: NEGATIVE
HGB URINE DIPSTICK: NEGATIVE
Ketones, ur: NEGATIVE
LEUKOCYTES UA: NEGATIVE
NITRITE: NEGATIVE
Specific Gravity, Urine: 1.02 (ref 1.001–1.035)
pH: 7 (ref 5.0–8.0)

## 2016-04-30 LAB — URINE CULTURE: Organism ID, Bacteria: NO GROWTH

## 2016-05-03 ENCOUNTER — Other Ambulatory Visit: Payer: Self-pay

## 2016-05-03 DIAGNOSIS — R809 Proteinuria, unspecified: Secondary | ICD-10-CM | POA: Diagnosis not present

## 2016-05-04 ENCOUNTER — Other Ambulatory Visit: Payer: Self-pay | Admitting: Internal Medicine

## 2016-05-04 LAB — PROTEIN, URINE, 24 HOUR
Protein, 24H Urine: 315 mg/24 h — ABNORMAL HIGH (ref <150)
Protein, Urine: 21 mg/dL (ref 5–25)

## 2016-05-04 MED ORDER — LOSARTAN POTASSIUM 50 MG PO TABS: 50.0000 mg | ORAL_TABLET | Freq: Every day | ORAL | 11 refills | Status: DC

## 2016-05-09 ENCOUNTER — Telehealth: Payer: Self-pay | Admitting: Internal Medicine

## 2016-05-09 NOTE — Telephone Encounter (Signed)
Patient called asking whether there were interactions between his losartan and previous medications.  I did caution him that when he restarts the rapaflo to try cutting losartan in half to help avoid hypotension.  He is going in for surgery next week with Dr. Noemi Chapel and they are keeping him overnight.

## 2016-05-17 ENCOUNTER — Encounter: Payer: Self-pay | Admitting: Gastroenterology

## 2016-05-18 DIAGNOSIS — M75111 Incomplete rotator cuff tear or rupture of right shoulder, not specified as traumatic: Secondary | ICD-10-CM | POA: Diagnosis not present

## 2016-05-18 DIAGNOSIS — M7521 Bicipital tendinitis, right shoulder: Secondary | ICD-10-CM | POA: Diagnosis not present

## 2016-05-18 DIAGNOSIS — M7551 Bursitis of right shoulder: Secondary | ICD-10-CM | POA: Diagnosis not present

## 2016-05-18 DIAGNOSIS — M94211 Chondromalacia, right shoulder: Secondary | ICD-10-CM | POA: Diagnosis not present

## 2016-05-18 DIAGNOSIS — M7541 Impingement syndrome of right shoulder: Secondary | ICD-10-CM | POA: Diagnosis not present

## 2016-05-18 DIAGNOSIS — M66811 Spontaneous rupture of other tendons, right shoulder: Secondary | ICD-10-CM | POA: Diagnosis not present

## 2016-05-18 DIAGNOSIS — M24111 Other articular cartilage disorders, right shoulder: Secondary | ICD-10-CM | POA: Diagnosis not present

## 2016-05-18 DIAGNOSIS — M19011 Primary osteoarthritis, right shoulder: Secondary | ICD-10-CM | POA: Diagnosis not present

## 2016-05-18 DIAGNOSIS — G8918 Other acute postprocedural pain: Secondary | ICD-10-CM | POA: Diagnosis not present

## 2016-05-23 DIAGNOSIS — S46011A Strain of muscle(s) and tendon(s) of the rotator cuff of right shoulder, initial encounter: Secondary | ICD-10-CM | POA: Diagnosis not present

## 2016-05-23 DIAGNOSIS — M7521 Bicipital tendinitis, right shoulder: Secondary | ICD-10-CM | POA: Diagnosis not present

## 2016-05-23 DIAGNOSIS — M25511 Pain in right shoulder: Secondary | ICD-10-CM | POA: Diagnosis not present

## 2016-05-26 DIAGNOSIS — M7521 Bicipital tendinitis, right shoulder: Secondary | ICD-10-CM | POA: Diagnosis not present

## 2016-05-26 DIAGNOSIS — S46011A Strain of muscle(s) and tendon(s) of the rotator cuff of right shoulder, initial encounter: Secondary | ICD-10-CM | POA: Diagnosis not present

## 2016-05-26 DIAGNOSIS — M25511 Pain in right shoulder: Secondary | ICD-10-CM | POA: Diagnosis not present

## 2016-05-31 DIAGNOSIS — M7521 Bicipital tendinitis, right shoulder: Secondary | ICD-10-CM | POA: Diagnosis not present

## 2016-05-31 DIAGNOSIS — S46011A Strain of muscle(s) and tendon(s) of the rotator cuff of right shoulder, initial encounter: Secondary | ICD-10-CM | POA: Diagnosis not present

## 2016-05-31 DIAGNOSIS — M25511 Pain in right shoulder: Secondary | ICD-10-CM | POA: Diagnosis not present

## 2016-06-03 ENCOUNTER — Other Ambulatory Visit: Payer: Self-pay | Admitting: Internal Medicine

## 2016-06-03 DIAGNOSIS — F411 Generalized anxiety disorder: Secondary | ICD-10-CM

## 2016-06-03 MED ORDER — ALPRAZOLAM 1 MG PO TABS: ORAL_TABLET | ORAL | 1 refills | Status: DC

## 2016-06-03 NOTE — Telephone Encounter (Signed)
please call alprazolam

## 2016-06-07 DIAGNOSIS — S46011D Strain of muscle(s) and tendon(s) of the rotator cuff of right shoulder, subsequent encounter: Secondary | ICD-10-CM | POA: Diagnosis not present

## 2016-06-07 DIAGNOSIS — M7521 Bicipital tendinitis, right shoulder: Secondary | ICD-10-CM | POA: Diagnosis not present

## 2016-06-07 DIAGNOSIS — M25511 Pain in right shoulder: Secondary | ICD-10-CM | POA: Diagnosis not present

## 2016-06-13 DIAGNOSIS — M7521 Bicipital tendinitis, right shoulder: Secondary | ICD-10-CM | POA: Diagnosis not present

## 2016-06-13 DIAGNOSIS — S46011D Strain of muscle(s) and tendon(s) of the rotator cuff of right shoulder, subsequent encounter: Secondary | ICD-10-CM | POA: Diagnosis not present

## 2016-06-13 DIAGNOSIS — M25511 Pain in right shoulder: Secondary | ICD-10-CM | POA: Diagnosis not present

## 2016-06-20 DIAGNOSIS — M25511 Pain in right shoulder: Secondary | ICD-10-CM | POA: Diagnosis not present

## 2016-06-21 ENCOUNTER — Other Ambulatory Visit: Payer: Self-pay | Admitting: *Deleted

## 2016-06-21 MED ORDER — TADALAFIL 20 MG PO TABS: ORAL_TABLET | ORAL | 2 refills | Status: DC

## 2016-06-22 DIAGNOSIS — M7521 Bicipital tendinitis, right shoulder: Secondary | ICD-10-CM | POA: Diagnosis not present

## 2016-06-22 DIAGNOSIS — M25511 Pain in right shoulder: Secondary | ICD-10-CM | POA: Diagnosis not present

## 2016-06-22 DIAGNOSIS — S46011D Strain of muscle(s) and tendon(s) of the rotator cuff of right shoulder, subsequent encounter: Secondary | ICD-10-CM | POA: Diagnosis not present

## 2016-06-27 ENCOUNTER — Ambulatory Visit: Payer: Self-pay | Admitting: Internal Medicine

## 2016-06-29 DIAGNOSIS — M7521 Bicipital tendinitis, right shoulder: Secondary | ICD-10-CM | POA: Diagnosis not present

## 2016-06-29 DIAGNOSIS — S46011D Strain of muscle(s) and tendon(s) of the rotator cuff of right shoulder, subsequent encounter: Secondary | ICD-10-CM | POA: Diagnosis not present

## 2016-06-29 DIAGNOSIS — M25511 Pain in right shoulder: Secondary | ICD-10-CM | POA: Diagnosis not present

## 2016-06-30 ENCOUNTER — Encounter: Payer: Self-pay | Admitting: Internal Medicine

## 2016-06-30 ENCOUNTER — Ambulatory Visit (INDEPENDENT_AMBULATORY_CARE_PROVIDER_SITE_OTHER): Payer: Medicare Other | Admitting: Internal Medicine

## 2016-06-30 VITALS — BP 122/78 | HR 72 | Temp 97.5°F | Resp 16 | Ht 72.0 in | Wt 243.6 lb

## 2016-06-30 DIAGNOSIS — E559 Vitamin D deficiency, unspecified: Secondary | ICD-10-CM | POA: Diagnosis not present

## 2016-06-30 DIAGNOSIS — R809 Proteinuria, unspecified: Secondary | ICD-10-CM | POA: Diagnosis not present

## 2016-06-30 DIAGNOSIS — Z79899 Other long term (current) drug therapy: Secondary | ICD-10-CM

## 2016-06-30 DIAGNOSIS — R7309 Other abnormal glucose: Secondary | ICD-10-CM

## 2016-06-30 DIAGNOSIS — I10 Essential (primary) hypertension: Secondary | ICD-10-CM

## 2016-06-30 DIAGNOSIS — E782 Mixed hyperlipidemia: Secondary | ICD-10-CM

## 2016-06-30 LAB — CBC WITH DIFFERENTIAL/PLATELET
BASOS PCT: 0 %
Basophils Absolute: 0 cells/uL (ref 0–200)
EOS ABS: 279 {cells}/uL (ref 15–500)
Eosinophils Relative: 3 %
HEMATOCRIT: 46 % (ref 38.5–50.0)
HEMOGLOBIN: 15.5 g/dL (ref 13.2–17.1)
LYMPHS ABS: 1767 {cells}/uL (ref 850–3900)
LYMPHS PCT: 19 %
MCH: 31.3 pg (ref 27.0–33.0)
MCHC: 33.7 g/dL (ref 32.0–36.0)
MCV: 92.9 fL (ref 80.0–100.0)
MONO ABS: 744 {cells}/uL (ref 200–950)
MPV: 10.1 fL (ref 7.5–12.5)
Monocytes Relative: 8 %
NEUTROS PCT: 70 %
Neutro Abs: 6510 cells/uL (ref 1500–7800)
Platelets: 215 10*3/uL (ref 140–400)
RBC: 4.95 MIL/uL (ref 4.20–5.80)
RDW: 13.3 % (ref 11.0–15.0)
WBC: 9.3 10*3/uL (ref 3.8–10.8)

## 2016-06-30 LAB — HEMOGLOBIN A1C
HEMOGLOBIN A1C: 5.2 % (ref <5.7)
MEAN PLASMA GLUCOSE: 103 mg/dL

## 2016-06-30 LAB — TSH: TSH: 0.76 m[IU]/L (ref 0.40–4.50)

## 2016-06-30 NOTE — Progress Notes (Signed)
[facility] Unk Pinto, M.D.        Uvaldo Bristle. Silverio Lay, P.A.-C       Starlyn Skeans, P.A.-C  [facility]                [address] [place] [id number] Telephone [phone] Telefax [phone] ______________________________________________________________________     This very nice 70 y.o. MWM presents for 6 month follow up with Hypertension, Hyperlipidemia, Pre-Diabetes and Vitamin D Deficiency.      Patient has hx/o BPH and prostatism  and elevated PSA and is followed by Dr McDiarmid. He has had #2 trips to the ER for catheterization to rescue acute urinary retention.  Has been treated in the past with Flomax and recently declined Rapaflo due to Rx cost of $280/mo. He also had recent sl elevated Malbu & 24 hr Protein and was started on Losartan.      Patient is treated for HTN (2007) & BP has been controlled at home. Today's BP is at goal -  122/78. Patient has had no complaints of any cardiac type chest pain, palpitations, dyspnea/orthopnea/PND, dizziness, claudication, or dependent edema.     Hyperlipidemia is controlled with diet & meds. Patient denies myalgias or other med SE's. Last Lipids were  Lab Results  Component Value Date   CHOL 161 12/21/2015   HDL 51 12/21/2015   LDLCALC 78 12/21/2015   TRIG 158 (H) 12/21/2015   CHOLHDL 3.2 12/21/2015      Also, the patient has history of PreDiabetes since 2013 with A1c 5.7% and has had no symptoms of reactive hypoglycemia, diabetic polys, paresthesias or visual blurring.  Last A1c was  Lab Results  Component Value Date   HGBA1C 5.5 12/21/2015      Further, the patient also has history of Vitamin D Deficiency of "33" in 2008  and supplements vitamin D without any suspected side-effects. Last vitamin D was  at goal: Lab Results  Component Value Date   VD25OH 68 12/21/2015   Current Outpatient Prescriptions on File Prior to Visit   Medication Sig  . ALPRAZolam (XANAX) 1 MG tablet Taken 1/2  to 1 tablet 3 x/day if needed for anxiety  . aspirin 81 MG tablet Take 81 mg by mouth daily.   . Cholecalciferol (VITAMIN D-3) 5000 UNITS TABS Take 5,000 Units by mouth daily.   . finasteride (PROSCAR) 5 MG tablet Take 5 mg by mouth daily.  Marland Kitchen losartan (COZAAR) 50 MG tablet Take 1 tablet (50 mg total) by mouth daily.  . meloxicam (MOBIC) 15 MG tablet TAKE 1 TABLET BY MOUTH EVERY DAY AS NEEDED FOR PAIN WITH FOOD AS NEEDED  . Multiple Vitamin (MULTIVITAMIN) tablet Take 1 tablet by mouth daily.  . rosuvastatin (CRESTOR) 40 MG tablet Take 1 tablet (40 mg total) by mouth daily.  . tadalafil (CIALIS) 20 MG tablet Take 1/2 to 1 tablet daily or as directed  . verapamil (CALAN) 120 MG tablet TAKE 1 TABLET BY MOUTH AT BEDTIME  . vitamin C (ASCORBIC ACID) 500 MG tablet Take 500 mg by mouth daily.   No current facility-administered medications on file prior to visit.    Allergies  Allergen Reactions  . Clonazepam Other (See Comments)    "mean"  . Flomax [Tamsulosin Hcl] Other (See Comments)    hypotension  . Lipitor [Atorvastatin] Other (See Comments)    Myalgias  PMHx:   Past Medical History:  Diagnosis Date  . Arthritis   . BPH (benign prostatic hypertrophy)   . Frequency of urination   . Hyperlipidemia   . Hypertension   . IBS (irritable bowel syndrome)   . Mild acid reflux occasional  . Nocturia   . Obesity   . Pelvic pain in male   . Pre-diabetes   . Urgency of urination   . Vitamin D deficiency   . Wears glasses    Immunization History  Administered Date(s) Administered  . DTaP 07/11/2005  . Influenza Whole 04/02/2013  . Influenza, High Dose Seasonal PF 05/01/2014, 05/21/2015, 03/29/2016  . Pneumococcal Conjugate-13 05/01/2014  . Pneumococcal Polysaccharide-23 09/08/2008, 03/29/2016  . Td 12/16/2015  . Zoster 12/09/2008   Past Surgical History:  Procedure Laterality Date  . ANTERIOR CERVICAL  DECOMP/DISCECTOMY FUSION  01-25-2002   C4 - C5  . ANTERIOR CRUCIATE LIGAMENT REPAIR  1990   RIGHT  . APPENDECTOMY  02-07-2007  . CERVICAL FUSION  2000   C5 - 6  . COLONOSCOPY  05-30-2011   HEMORRHOIDS  . HIP SURGERY  1999   LEFT HIP --  REMOVAL CALCIUM BUILD-UP  . LEFT ANKLE SURG.  1980  . LEFT INGUINAL HERNIA REPAIR  1970  . LEFT ROTATOR CUFF REPAIR  2002  . TENDON RECONSTRUCTION Left 07/17/2013   Procedure: LEFT ELBOW LATERAL RECONSTRUCTION;  Surgeon: Schuyler Amor, MD;  Location: [facility];  Service: Orthopedics;  Laterality: Left;  . TONSILLECTOMY    . TRIGGER FINGER RELEASE Right 07/17/2013   Procedure: RELEASE A-1 PULLEY RIGHT RING FINGER;  Surgeon: Schuyler Amor, MD;  Location: [facility];  Service: Orthopedics;  Laterality: Right;  . UMBILICAL HERNIA REPAIR  2003   FHx:    Reviewed / unchanged  SHx:    Reviewed / unchanged  Systems Review:  Constitutional: Denies fever, chills, wt changes, headaches, insomnia, fatigue, night sweats, change in appetite. Eyes: Denies redness, blurred vision, diplopia, discharge, itchy, watery eyes.  ENT: Denies discharge, congestion, post nasal drip, epistaxis, sore throat, earache, hearing loss, dental pain, tinnitus, vertigo, sinus pain, snoring.  CV: Denies chest pain, palpitations, irregular heartbeat, syncope, dyspnea, diaphoresis, orthopnea, PND, claudication or edema. Respiratory: denies cough, dyspnea, DOE, pleurisy, hoarseness, laryngitis, wheezing.  Gastrointestinal: Denies dysphagia, odynophagia, heartburn, reflux, water brash, abdominal pain or cramps, nausea, vomiting, bloating, diarrhea, constipation, hematemesis, melena, hematochezia  or hemorrhoids. Genitourinary: Denies dysuria, frequency, urgency, nocturia, hesitancy, discharge, hematuria or flank pain. Musculoskeletal: Denies arthralgias, myalgias, stiffness, jt. swelling, pain, limping or strain/sprain.  Skin: Denies pruritus, rash,  hives, warts, acne, eczema or change in skin lesion(s). Neuro: No weakness, tremor, incoordination, spasms, paresthesia or pain. Psychiatric: Denies confusion, memory loss or sensory loss. Endo: Denies change in weight, skin or hair change.  Heme/Lymph: No excessive bleeding, bruising or enlarged lymph nodes.  Physical Exam  BP 122/78   Pulse 72   Temp 97.5 F (36.4 C)   Resp 16   Ht 6' (1.829 m)   Wt 243 lb 9.6 oz (110.5 kg)   BMI 33.04 kg/m   Appears well nourished and in no distress.  Eyes: PERRLA, EOMs, conjunctiva no swelling or erythema. Sinuses: No frontal/maxillary tenderness ENT/Mouth: EAC's clear, TM's nl w/o erythema, bulging. Nares clear w/o erythema, swelling, exudates. Oropharynx clear without erythema or exudates. Oral hygiene is good. Tongue normal, non obstructing. Hearing intact.  Neck: Supple. Thyroid nl. Car 2+/2+ without bruits, nodes or JVD. Chest: Respirations nl with  BS clear & equal w/o rales, rhonchi, wheezing or stridor.  Cor: Heart sounds normal w/ regular rate and rhythm without sig. murmurs, gallops, clicks, or rubs. Peripheral pulses normal and equal  without edema.  Abdomen: Soft & bowel sounds normal. Non-tender w/o guarding, rebound, hernias, masses, or organomegaly.  Lymphatics: Unremarkable.  Musculoskeletal: Full ROM all peripheral extremities, joint stability, 5/5 strength, and normal gait.  Skin: Warm, dry without exposed rashes, lesions or ecchymosis apparent.  Neuro: Cranial nerves intact, reflexes equal bilaterally. Sensory-motor testing grossly intact. Tendon reflexes grossly intact.  Pysch: Alert & oriented x 3.  Insight and judgement nl & appropriate. No ideations.  Assessment and Plan:  1. Essential hypertension  - Continue medication, monitor blood pressure at home.  - Continue DASH diet. Reminder to go to the ER if any CP,  SOB, nausea, dizziness, severe HA, changes vision/speech,  left arm numbness and tingling and jaw  pain.  - CBC with Differential/Platelet - BASIC METABOLIC PANEL WITH GFR - TSH  2. Mixed hyperlipidemia  - Continue diet/meds, exercise,& lifestyle modifications.  - Continue monitor periodic cholesterol/liver & renal functions   - Hepatic function panel - Lipid panel - TSH  3. Other abnormal glucose  - Continue diet, exercise, lifestyle modifications.  - Monitor appropriate labs. - Hemoglobin A1c - Insulin, random  4. Vitamin D deficiency  - Continue supplementation. - VITAMIN D 25 Hydroxy   5. Proteinuria, unspecified type  - Microalbumin / creatinine urine ratio  6. Medication management  - CBC with Differential/Platelet - BASIC METABOLIC PANEL WITH GFR - Hepatic function panel - Magnesium       Recommended regular exercise, BP monitoring, weight control, and discussed med and SE's. Recommended labs to assess and monitor clinical status. Further disposition pending results of labs. Over 30 minutes of exam, counseling, chart review was performed

## 2016-06-30 NOTE — Patient Instructions (Signed)

## 2016-07-01 ENCOUNTER — Other Ambulatory Visit: Payer: Self-pay | Admitting: *Deleted

## 2016-07-01 LAB — MICROALBUMIN / CREATININE URINE RATIO
CREATININE, URINE: 169 mg/dL (ref 20–370)
MICROALB UR: 9.1 mg/dL
MICROALB/CREAT RATIO: 54 ug/mg{creat} — AB (ref <30)

## 2016-07-01 LAB — HEPATIC FUNCTION PANEL
ALBUMIN: 4.4 g/dL (ref 3.6–5.1)
ALK PHOS: 75 U/L (ref 40–115)
ALT: 27 U/L (ref 9–46)
AST: 22 U/L (ref 10–35)
BILIRUBIN INDIRECT: 0.3 mg/dL (ref 0.2–1.2)
Bilirubin, Direct: 0.1 mg/dL (ref <=0.2)
TOTAL PROTEIN: 6.9 g/dL (ref 6.1–8.1)
Total Bilirubin: 0.4 mg/dL (ref 0.2–1.2)

## 2016-07-01 LAB — BASIC METABOLIC PANEL WITH GFR
BUN: 15 mg/dL (ref 7–25)
CO2: 23 mmol/L (ref 20–31)
Calcium: 9.6 mg/dL (ref 8.6–10.3)
Chloride: 104 mmol/L (ref 98–110)
Creat: 0.82 mg/dL (ref 0.70–1.18)
GLUCOSE: 82 mg/dL (ref 65–99)
POTASSIUM: 4.2 mmol/L (ref 3.5–5.3)
Sodium: 141 mmol/L (ref 135–146)

## 2016-07-01 LAB — MAGNESIUM: Magnesium: 1.9 mg/dL (ref 1.5–2.5)

## 2016-07-01 LAB — LIPID PANEL
Cholesterol: 214 mg/dL — ABNORMAL HIGH (ref <200)
HDL: 36 mg/dL — ABNORMAL LOW (ref >40)
LDL CALC: 126 mg/dL — AB (ref <100)
TRIGLYCERIDES: 262 mg/dL — AB (ref <150)
Total CHOL/HDL Ratio: 5.9 Ratio — ABNORMAL HIGH (ref <5.0)
VLDL: 52 mg/dL — AB (ref <30)

## 2016-07-01 LAB — VITAMIN D 25 HYDROXY (VIT D DEFICIENCY, FRACTURES): Vit D, 25-Hydroxy: 52 ng/mL (ref 30–100)

## 2016-07-01 LAB — INSULIN, RANDOM: Insulin: 20.5 u[IU]/mL — ABNORMAL HIGH (ref 2.0–19.6)

## 2016-07-01 MED ORDER — LOSARTAN POTASSIUM 50 MG PO TABS: 50.0000 mg | ORAL_TABLET | Freq: Every day | ORAL | 3 refills | Status: DC

## 2016-07-11 DIAGNOSIS — I4891 Unspecified atrial fibrillation: Secondary | ICD-10-CM

## 2016-07-11 HISTORY — DX: Unspecified atrial fibrillation: I48.91

## 2016-07-19 DIAGNOSIS — M25511 Pain in right shoulder: Secondary | ICD-10-CM | POA: Diagnosis not present

## 2016-07-19 DIAGNOSIS — M1712 Unilateral primary osteoarthritis, left knee: Secondary | ICD-10-CM | POA: Diagnosis not present

## 2016-07-20 DIAGNOSIS — S46011D Strain of muscle(s) and tendon(s) of the rotator cuff of right shoulder, subsequent encounter: Secondary | ICD-10-CM | POA: Diagnosis not present

## 2016-07-20 DIAGNOSIS — M25511 Pain in right shoulder: Secondary | ICD-10-CM | POA: Diagnosis not present

## 2016-07-20 DIAGNOSIS — M7521 Bicipital tendinitis, right shoulder: Secondary | ICD-10-CM | POA: Diagnosis not present

## 2016-07-25 ENCOUNTER — Ambulatory Visit (INDEPENDENT_AMBULATORY_CARE_PROVIDER_SITE_OTHER): Payer: Medicare Other | Admitting: Physician Assistant

## 2016-07-25 ENCOUNTER — Encounter: Payer: Self-pay | Admitting: Physician Assistant

## 2016-07-25 VITALS — BP 122/64 | HR 88 | Temp 97.5°F | Resp 16 | Ht 72.0 in | Wt 243.0 lb

## 2016-07-25 DIAGNOSIS — R05 Cough: Secondary | ICD-10-CM

## 2016-07-25 DIAGNOSIS — I4891 Unspecified atrial fibrillation: Secondary | ICD-10-CM

## 2016-07-25 DIAGNOSIS — R059 Cough, unspecified: Secondary | ICD-10-CM

## 2016-07-25 DIAGNOSIS — R009 Unspecified abnormalities of heart beat: Secondary | ICD-10-CM

## 2016-07-25 MED ORDER — DOXYCYCLINE HYCLATE 100 MG PO CAPS: ORAL_CAPSULE | ORAL | 0 refills | Status: DC

## 2016-07-25 MED ORDER — PREDNISONE 20 MG PO TABS: ORAL_TABLET | ORAL | 0 refills | Status: DC

## 2016-07-25 MED ORDER — PROMETHAZINE-CODEINE 6.25-10 MG/5ML PO SYRP: 5.0000 mL | ORAL_SOLUTION | Freq: Four times a day (QID) | ORAL | 0 refills | Status: DC | PRN

## 2016-07-25 MED ORDER — RIVAROXABAN 20 MG PO TABS: 20.0000 mg | ORAL_TABLET | Freq: Every day | ORAL | 3 refills | Status: DC

## 2016-07-25 NOTE — Patient Instructions (Addendum)
Take 1 of the verapamil Then take another one tonight Take xarerlto 20mg  at night with water  Start on the prednisone and cough syrup If you are not better get on doxycycline  Will refer to heart doctor to evaluate  Go to the ER if any worsening SOB, CP  Make sure you are on an allergy pill, see below for more details. Please take the prednisone as directed below, this is NOT an antibiotic so you do NOT have to finish it. You can take it for a few days and stop it if you are doing better.   Please take the prednisone to help decrease inflammation and therefore decrease symptoms. Take it it with food to avoid GI upset. It can cause increased energy but on the other hand it can make it hard to sleep at night so please take it AT [place], it takes 8-12 hours to start working so it will NOT affect your sleeping if you take it at night with your food!!  If you are diabetic it will increase your sugars so decrease carbs and monitor your sugars closely.     HOW TO TREAT VIRAL COUGH AND COLD SYMPTOMS:  -Symptoms usually last at least 1 week with the worst symptoms being around day 4.  - colds usually start with a sore throat and end with a cough, and the cough can take 2 weeks to get better.  -No antibiotics are needed for colds, flu, sore throats, cough, bronchitis UNLESS symptoms are longer than 7 days OR if you are getting better then get drastically worse.  -There are a lot of combination medications (Dayquil, Nyquil, Vicks 44, tyelnol cold and sinus, ETC). Please look at the ingredients on the back so that you are treating the correct symptoms and not doubling up on medications/ingredients.    Medicines you can use  Nasal congestion  - pseudoephedrine (Sudafed)- behind the counter, do not use if you have high blood pressure, medicine that have -D in them.  - phenylephrine (Sudafed PE) -Dextormethorphan + chlorpheniramine (Coridcidin HBP)- okay if you have high blood  pressure -Oxymetazoline (Afrin) nasal spray- LIMIT to 3 days -Saline nasal spray -Neti pot (used distilled or bottled water)  Ear pain/congestion  -pseudoephedrine (sudafed) - Nasonex/flonase nasal spray  Fever  -Acetaminophen (Tyelnol) -Ibuprofen (Advil, motrin, aleve)  Sore Throat  -Acetaminophen (Tyelnol) -Ibuprofen (Advil, motrin, aleve) -Drink a lot of water -Gargle with salt water - Rest your voice (don't talk) -Throat sprays -Cough drops  Body Aches  -Acetaminophen (Tyelnol) -Ibuprofen (Advil, motrin, aleve)  Headache  -Acetaminophen (Tyelnol) -Ibuprofen (Advil, motrin, aleve) - Exedrin, Exedrin Migraine  Allergy symptoms (cough, sneeze, runny nose, itchy eyes) -Claritin or loratadine cheapest but likely the weakest  -Zyrtec or certizine at night because it can make you sleepy -The strongest is allegra or fexafinadine  Cheapest at [employer], [employer], [employer]  Cough  -Dextromethorphan (Delsym)- medicine that has DM in it -Guafenesin (Mucinex/Robitussin) - cough drops - drink lots of water  Chest Congestion  -Guafenesin (Mucinex/Robitussin)  Red Itchy Eyes  - Naphcon-A  Upset Stomach  - Bland diet (nothing spicy, greasy, fried, and high acid foods like tomatoes, oranges, berries) -OKAY- cereal, bread, soup, crackers, rice -Eat smaller more frequent meals -reduce caffeine, no alcohol -Loperamide (Imodium-AD) if diarrhea -Prevacid for heart burn  General health when sick  -Hydration -wash your hands frequently -keep surfaces clean -change pillow cases and sheets often -Get fresh air but do not exercise strenuously -Vitamin D, double up  on it - Vitamin C -Zinc   Atrial Fibrillation Atrial fibrillation is a type of irregular or rapid heartbeat (arrhythmia). In atrial fibrillation, the heart quivers continuously in a chaotic pattern. This occurs when parts of the heart receive disorganized signals that make the heart unable to pump blood normally. This  can increase the risk for stroke, heart failure, and other heart-related conditions. There are different types of atrial fibrillation, including:  Paroxysmal atrial fibrillation. This type starts suddenly, and it usually stops on its own shortly after it starts.  Persistent atrial fibrillation. This type often lasts longer than a week. It may stop on its own or with treatment.  Long-lasting persistent atrial fibrillation. This type lasts longer than 12 months.  Permanent atrial fibrillation. This type does not go away. Talk with your health care provider to learn about the type of atrial fibrillation that you have. What are the causes? This condition is caused by some heart-related conditions or procedures, including:  A heart attack.  Coronary artery disease.  Heart failure.  Heart valve conditions.  High blood pressure.  Inflammation of the sac that surrounds the heart (pericarditis).  Heart surgery.  Certain heart rhythm disorders, such as Wolf-Parkinson-White syndrome. Other causes include:  Pneumonia.  Obstructive sleep apnea.  Blockage of an artery in the lungs (pulmonary embolism, or PE).  Lung cancer.  Chronic lung disease.  Thyroid problems, especially if the thyroid is overactive (hyperthyroidism).  Caffeine.  Excessive alcohol use or illegal drug use.  Use of some medicines, including certain decongestants and diet pills. Sometimes, the cause cannot be found. What increases the risk? This condition is more likely to develop in:  People who are older in age.  People who smoke.  People who have diabetes mellitus.  People who are overweight (obese).  Athletes who exercise vigorously. What are the signs or symptoms? Symptoms of this condition include:  A feeling that your heart is beating rapidly or irregularly.  A feeling of discomfort or pain in your chest.  Shortness of breath.  Sudden light-headedness or weakness.  Getting tired easily  during exercise. In some cases, there are no symptoms. How is this diagnosed? Your health care provider may be able to detect atrial fibrillation when taking your pulse. If detected, this condition may be diagnosed with:  An electrocardiogram (ECG).  A Holter monitor test that records your heartbeat patterns over a 24-hour period.  Transthoracic echocardiogram (TTE) to evaluate how blood flows through your heart.  Transesophageal echocardiogram (TEE) to view more detailed images of your heart.  A stress test.  Imaging tests, such as a CT scan or chest X-ray.  Blood tests. How is this treated? The main goals of treatment are to prevent blood clots from forming and to keep your heart beating at a normal rate and rhythm. The type of treatment that you receive depends on many factors, such as your underlying medical conditions and how you feel when you are experiencing atrial fibrillation. This condition may be treated with:  Medicine to slow down the heart rate, bring the heart's rhythm back to normal, or prevent clots from forming.  Electrical cardioversion. This is a procedure that resets your heart's rhythm by delivering a controlled, low-energy shock to the heart through your skin.  Different types of ablation, such as catheter ablation, catheter ablation with pacemaker, or surgical ablation. These procedures destroy the heart tissues that send abnormal signals. When the pacemaker is used, it is placed under your skin to help your  heart beat in a regular rhythm. Follow these instructions at home:  Take over-the counter and prescription medicines only as told by your health care provider.  If your health care provider prescribed a blood-thinning medicine (anticoagulant), take it exactly as told. Taking too much blood-thinning medicine can cause bleeding. If you do not take enough blood-thinning medicine, you will not have the protection that you need against stroke and other  problems.  Do not use tobacco products, including cigarettes, chewing tobacco, and e-cigarettes. If you need help quitting, ask your health care provider.  If you have obstructive sleep apnea, manage your condition as told by your health care provider.  Do not drink alcohol.  Do not drink beverages that contain caffeine, such as coffee, soda, and tea.  Maintain a healthy weight. Do not use diet pills unless your health care provider approves. Diet pills may make heart problems worse.  Follow diet instructions as told by your health care provider.  Exercise regularly as told by your health care provider.  Keep all follow-up visits as told by your health care provider. This is important. How is this prevented?  Avoid drinking beverages that contain caffeine or alcohol.  Avoid certain medicines, especially medicines that are used for breathing problems.  Avoid certain herbs and herbal medicines, such as those that contain ephedra or ginseng.  Do not use illegal drugs, such as cocaine and amphetamines.  Do not smoke.  Manage your high blood pressure. Contact a health care provider if:  You notice a change in the rate, rhythm, or strength of your heartbeat.  You are taking an anticoagulant and you notice increased bruising.  You tire more easily when you exercise or exert yourself. Get help right away if:  You have chest pain, abdominal pain, sweating, or weakness.  You feel nauseous.  You notice blood in your vomit, bowel movement, or urine.  You have shortness of breath.  You suddenly have swollen feet and ankles.  You feel dizzy.  You have sudden weakness or numbness of the face, arm, or leg, especially on one side of the body.  You have trouble speaking, trouble understanding, or both (aphasia).  Your face or your eyelid droops on one side. These symptoms may represent a serious problem that is an emergency. Do not wait to see if the symptoms will go away. Get  medical help right away. Call your local emergency services (911 in the [place] Do not drive yourself to the hospital.  This information is not intended to replace advice given to you by your health care provider. Make sure you discuss any questions you have with your health care provider. Document Released: 06/27/2005 Document Revised: 11/04/2015 Document Reviewed: 10/22/2014 Elsevier Interactive Patient Education  2017 [employer].

## 2016-07-25 NOTE — Progress Notes (Signed)
   Subjective:    Patient ID: [name], male    DOB: 27-Mar-1946, 71 y.o.   MRN: [id number]  HPI 71 y.o. obese WM presents with URI x 4 days.  He has hoarseness, cough, light headed, mild SOB, no CP. Denies fever. He is not taking any but tessalon. Mother in law is at [employer], Was on prednisone x 1-2 weeks, finished it Friday.   Blood pressure 122/64, pulse 88, temperature 97.5 F (36.4 C), resp. rate 16, height 6' (1.829 m), weight 243 lb (110.2 kg), SpO2 98 %.  Medications Current Outpatient Prescriptions on File Prior to Visit  Medication Sig  . ALPRAZolam (XANAX) 1 MG tablet Taken 1/2  to 1 tablet 3 x/day if needed for anxiety  . aspirin 81 MG tablet Take 81 mg by mouth daily.   . Cholecalciferol (VITAMIN D-3) 5000 UNITS TABS Take 5,000 Units by mouth daily.   . finasteride (PROSCAR) 5 MG tablet Take 5 mg by mouth daily.  Marland Kitchen losartan (COZAAR) 50 MG tablet Take 1 tablet (50 mg total) by mouth daily.  . Multiple Vitamin (MULTIVITAMIN) tablet Take 1 tablet by mouth daily.  Marland Kitchen RAPAFLO 8 MG CAPS capsule   . rosuvastatin (CRESTOR) 40 MG tablet Take 1 tablet (40 mg total) by mouth daily.  . tadalafil (CIALIS) 20 MG tablet Take 1/2 to 1 tablet daily or as directed  . verapamil (CALAN) 120 MG tablet TAKE 1 TABLET BY MOUTH AT BEDTIME  . vitamin C (ASCORBIC ACID) 500 MG tablet Take 500 mg by mouth daily.   No current facility-administered medications on file prior to visit.     Problem list He has Hypertension; Hyperlipidemia; BPH (benign prostatic hypertrophy); Other abnormal glucose; IBS (irritable bowel syndrome); Vitamin D deficiency; Medication management; Obesity; and BMI 33.0-33.9,adult on his problem list.  Review of Systems  Constitutional: Negative for chills, diaphoresis and fever.  HENT: Positive for congestion, postnasal drip, rhinorrhea, sinus pressure and sore throat. Negative for ear pain, sneezing, trouble swallowing and voice change.   Eyes: Negative.    Respiratory: Positive for cough and shortness of breath. Negative for chest tightness and wheezing.   Cardiovascular: Negative.  Negative for chest pain, palpitations and leg swelling.  Gastrointestinal: Negative.   Genitourinary: Negative.   Musculoskeletal: Negative.  Negative for neck pain.  Neurological: Positive for dizziness. Negative for tremors, seizures, syncope, facial asymmetry, speech difficulty, weakness, light-headedness, numbness and headaches.       Objective:   Physical Exam        Assessment & Plan:  URI  Doxycycline Prednisone Flonase, allergy  New onset Afib Declines ER and at this time vitals stable, no CP, SOB If any CP, SOB, etc go to ER Take a verapamil when you get home, and increase to a whole pill at night CHADSVASc is 2, will start anticoagulation at this time At this time will start on xarelto, samples given with RX Will refer to cardiology for further evaluation/echo Normal stress test 2014

## 2016-07-26 ENCOUNTER — Telehealth: Payer: Self-pay

## 2016-07-26 NOTE — Telephone Encounter (Signed)
Pt called wanting to know if he should cont taking his XARALTO with baby aspirin?  Per provider [facility]) pt can stop taking BABY ASPIRIN

## 2016-08-01 ENCOUNTER — Ambulatory Visit (INDEPENDENT_AMBULATORY_CARE_PROVIDER_SITE_OTHER): Payer: Medicare Other | Admitting: Physician Assistant

## 2016-08-01 ENCOUNTER — Encounter: Payer: Self-pay | Admitting: Physician Assistant

## 2016-08-01 ENCOUNTER — Ambulatory Visit (HOSPITAL_COMMUNITY)
Admission: RE | Admit: 2016-08-01 | Discharge: 2016-08-01 | Disposition: A | Discharge status: Home / Self Care | Payer: Medicare Other | Source: Ambulatory Visit | Attending: Physician Assistant | Admitting: Physician Assistant

## 2016-08-01 VITALS — BP 120/82 | HR 87 | Temp 97.7°F | Resp 14 | Ht 72.0 in | Wt 242.8 lb

## 2016-08-01 DIAGNOSIS — R05 Cough: Secondary | ICD-10-CM | POA: Insufficient documentation

## 2016-08-01 DIAGNOSIS — I4891 Unspecified atrial fibrillation: Secondary | ICD-10-CM | POA: Diagnosis not present

## 2016-08-01 DIAGNOSIS — R059 Cough, unspecified: Secondary | ICD-10-CM

## 2016-08-01 LAB — CBC WITH DIFFERENTIAL/PLATELET
BASOS PCT: 0 %
Basophils Absolute: 0 cells/uL (ref 0–200)
EOS ABS: 172 {cells}/uL (ref 15–500)
Eosinophils Relative: 1 %
HEMATOCRIT: 46.4 % (ref 38.5–50.0)
HEMOGLOBIN: 15.8 g/dL (ref 13.2–17.1)
LYMPHS ABS: 3268 {cells}/uL (ref 850–3900)
LYMPHS PCT: 19 %
MCH: 31.6 pg (ref 27.0–33.0)
MCHC: 34.1 g/dL (ref 32.0–36.0)
MCV: 92.8 fL (ref 80.0–100.0)
MONO ABS: 1204 {cells}/uL — AB (ref 200–950)
MPV: 9.6 fL (ref 7.5–12.5)
Monocytes Relative: 7 %
NEUTROS PCT: 73 %
Neutro Abs: 12556 cells/uL — ABNORMAL HIGH (ref 1500–7800)
Platelets: 256 10*3/uL (ref 140–400)
RBC: 5 MIL/uL (ref 4.20–5.80)
RDW: 13.6 % (ref 11.0–15.0)
WBC: 17.2 10*3/uL — AB (ref 3.8–10.8)

## 2016-08-01 MED ORDER — LEVOFLOXACIN 500 MG PO TABS: 500.0000 mg | ORAL_TABLET | Freq: Every day | ORAL | 0 refills | Status: DC

## 2016-08-01 MED ORDER — PROMETHAZINE-CODEINE 6.25-10 MG/5ML PO SYRP: 5.0000 mL | ORAL_SOLUTION | Freq: Four times a day (QID) | ORAL | 0 refills | Status: DC | PRN

## 2016-08-01 MED ORDER — ALBUTEROL SULFATE HFA 108 (90 BASE) MCG/ACT IN AERS: 2.0000 | INHALATION_SPRAY | RESPIRATORY_TRACT | 0 refills | Status: DC | PRN

## 2016-08-01 NOTE — Progress Notes (Signed)
Subjective:    Patient ID: [name], male    DOB: 1945-10-22, 71 y.o.   MRN: [id number]  HPI 71 y.o. obese WM presents with URI, was seen 01/15 treated with doxycycline and prednisone, states he continues to have cough with thick yellow sputum, no fever, has had some mild SOB more than usual. He has some "diaphram pain" intermittent, worse with cough, x 2-3 days, no other CP.  He is on verapamil daily 120mg  and xaretlo for new Afib. Has appointment with Dr. Rayann Heman on 29th.   Blood pressure 120/82, pulse 87, temperature 97.7 F (36.5 C), resp. rate 14, height 6' (1.829 m), weight 242 lb 12.8 oz (110.1 kg), SpO2 95 %.  Medications Current Outpatient Prescriptions on File Prior to Visit  Medication Sig  . ALPRAZolam (XANAX) 1 MG tablet Taken 1/2  to 1 tablet 3 x/day if needed for anxiety  . aspirin 81 MG tablet Take 81 mg by mouth daily.   . Cholecalciferol (VITAMIN D-3) 5000 UNITS TABS Take 5,000 Units by mouth daily.   . finasteride (PROSCAR) 5 MG tablet Take 5 mg by mouth daily.  Marland Kitchen losartan (COZAAR) 50 MG tablet Take 1 tablet (50 mg total) by mouth daily.  . Multiple Vitamin (MULTIVITAMIN) tablet Take 1 tablet by mouth daily.  Marland Kitchen RAPAFLO 8 MG CAPS capsule   . rivaroxaban (XARELTO) 20 MG TABS tablet Take 1 tablet (20 mg total) by mouth daily with supper.  . rosuvastatin (CRESTOR) 40 MG tablet Take 1 tablet (40 mg total) by mouth daily.  . tadalafil (CIALIS) 20 MG tablet Take 1/2 to 1 tablet daily or as directed  . verapamil (CALAN) 120 MG tablet TAKE 1 TABLET BY MOUTH AT BEDTIME  . vitamin C (ASCORBIC ACID) 500 MG tablet Take 500 mg by mouth daily.   No current facility-administered medications on file prior to visit.     Problem list He has Hypertension; Hyperlipidemia; BPH (benign prostatic hypertrophy); Other abnormal glucose; IBS (irritable bowel syndrome); Vitamin D deficiency; Medication management; Obesity; BMI 33.0-33.9,adult; and Atrial fibrillation ([place]) on his  problem list.  Review of Systems  Constitutional: Negative for diaphoresis.  HENT: Positive for congestion and sinus pressure. Negative for sneezing, trouble swallowing and voice change.   Eyes: Negative.   Respiratory: Negative for chest tightness.   Cardiovascular: Negative.  Negative for palpitations and leg swelling.  Gastrointestinal: Negative.   Genitourinary: Negative.   Musculoskeletal: Negative.  Negative for neck pain.  Neurological: Positive for dizziness. Negative for tremors, seizures, syncope, facial asymmetry, speech difficulty, weakness, light-headedness and numbness.       Objective:   Physical Exam  Constitutional: He is oriented to person, place, and time. He appears well-developed and well-nourished.  HENT:  Head: Normocephalic and atraumatic.  Right Ear: External ear normal.  Left Ear: External ear normal.  Nose: Nose normal.  Mouth/Throat: Oropharynx is clear and moist.  Eyes: Conjunctivae are normal. Pupils are equal, round, and reactive to light.  Neck: Normal range of motion. Neck supple.  Cardiovascular: Normal rate, regular rhythm and normal heart sounds.   No murmur heard. Pulmonary/Chest: Effort normal. No respiratory distress. He has wheezes. He has rales (left lower lobe). He exhibits no tenderness.  Abdominal: Soft. Bowel sounds are normal.  Lymphadenopathy:    He has no cervical adenopathy.  Neurological: He is alert and oriented to person, place, and time.  Skin: Skin is warm and dry.       Assessment & Plan:  ? Pneumonia  pending CXR Albuterol, Symbicort inhaler, levaquin, Flonase, allergy  New onset Afib-  In NSR at this time, continue the xaretlo and full verapamil Has had some increasing SOB- will get heart enzymes, CBC, CMET and treat like pneumonia CHADSVASc is 2 Follow up Dr. Rayann Heman Jan 29th At this time will start on xarelto, samples given with RX- ? Need to remain on it long term with CHADS 2  Normal stress test 2014

## 2016-08-01 NOTE — Progress Notes (Signed)
Pt states he is on his way to get his CXR now.

## 2016-08-01 NOTE — Patient Instructions (Signed)
Atrial Fibrillation °Introduction °Atrial fibrillation is a type of heartbeat that is irregular or fast (rapid). If you have this condition, your heart keeps quivering in a weird (chaotic) way. This condition can make it so your heart cannot pump blood normally. Having this condition gives a person more risk for stroke, heart failure, and other heart problems. There are different types of atrial fibrillation. Talk with your doctor to learn about the type that you have. °Follow these instructions at home: °· Take over-the-counter and prescription medicines only as told by your doctor. °· If your doctor prescribed a blood-thinning medicine, take it exactly as told. Taking too much of it can cause bleeding. If you do not take enough of it, you will not have the protection that you need against stroke and other problems. °· Do not use any tobacco products. These include cigarettes, chewing tobacco, and e-cigarettes. If you need help quitting, ask your doctor. °· If you have apnea (obstructive sleep apnea), manage it as told by your doctor. °· Do not drink alcohol. °· Do not drink beverages that have caffeine. These include coffee, soda, and tea. °· Maintain a healthy weight. Do not use diet pills unless your doctor says they are safe for you. Diet pills may make heart problems worse. °· Follow diet instructions as told by your doctor. °· Exercise regularly as told by your doctor. °· Keep all follow-up visits as told by your doctor. This is important. °Contact a doctor if: °· You notice a change in the speed, rhythm, or strength of your heartbeat. °· You are taking a blood-thinning medicine and you notice more bruising. °· You get tired more easily when you move or exercise. °Get help right away if: °· You have pain in your chest or your belly (abdomen). °· You have sweating or weakness. °· You feel sick to your stomach (nauseous). °· You notice blood in your throw up (vomit), poop (stool), or pee (urine). °· You are  short of breath. °· You suddenly have swollen feet and ankles. °· You feel dizzy. °· Your suddenly get weak or numb in your face, arms, or legs, especially if it happens on one side of your body. °· You have trouble talking, trouble understanding, or both. °· Your face or your eyelid droops on one side. °These symptoms may be an emergency. Do not wait to see if the symptoms will go away. Get medical help right away. Call your local emergency services (911 in the [place] Do not drive yourself to the hospital.  °This information is not intended to replace advice given to you by your health care provider. Make sure you discuss any questions you have with your health care provider. °Document Released: 04/05/2008 Document Revised: 12/03/2015 Document Reviewed: 10/22/2014 °© 2017 Elsevier ° °

## 2016-08-02 DIAGNOSIS — M25511 Pain in right shoulder: Secondary | ICD-10-CM | POA: Diagnosis not present

## 2016-08-02 DIAGNOSIS — S46011D Strain of muscle(s) and tendon(s) of the rotator cuff of right shoulder, subsequent encounter: Secondary | ICD-10-CM | POA: Diagnosis not present

## 2016-08-02 DIAGNOSIS — M7521 Bicipital tendinitis, right shoulder: Secondary | ICD-10-CM | POA: Diagnosis not present

## 2016-08-02 LAB — COMPREHENSIVE METABOLIC PANEL
ALBUMIN: 3.8 g/dL (ref 3.6–5.1)
ALK PHOS: 66 U/L (ref 40–115)
ALT: 34 U/L (ref 9–46)
AST: 21 U/L (ref 10–35)
BILIRUBIN TOTAL: 0.4 mg/dL (ref 0.2–1.2)
BUN: 22 mg/dL (ref 7–25)
CALCIUM: 9.6 mg/dL (ref 8.6–10.3)
CO2: 24 mmol/L (ref 20–31)
Chloride: 104 mmol/L (ref 98–110)
Creat: 0.87 mg/dL (ref 0.70–1.18)
Glucose, Bld: 91 mg/dL (ref 65–99)
POTASSIUM: 4 mmol/L (ref 3.5–5.3)
Sodium: 139 mmol/L (ref 135–146)
Total Protein: 6.2 g/dL (ref 6.1–8.1)

## 2016-08-02 LAB — CK TOTAL AND CKMB (NOT AT ARMC)
CK TOTAL: 95 U/L (ref 7–232)
CK, MB: 1.8 ng/mL (ref 0.0–5.0)
RELATIVE INDEX: 1.9 (ref 0.0–4.0)

## 2016-08-02 LAB — TROPONIN I: TROPONIN I: 0.01 ng/mL (ref <=0.05)

## 2016-08-03 ENCOUNTER — Ambulatory Visit (INDEPENDENT_AMBULATORY_CARE_PROVIDER_SITE_OTHER): Payer: Medicare Other | Admitting: Internal Medicine

## 2016-08-03 ENCOUNTER — Encounter: Payer: Self-pay | Admitting: Internal Medicine

## 2016-08-03 VITALS — BP 130/74 | HR 85 | Ht 72.0 in | Wt 233.2 lb

## 2016-08-03 DIAGNOSIS — I480 Paroxysmal atrial fibrillation: Secondary | ICD-10-CM | POA: Diagnosis not present

## 2016-08-03 DIAGNOSIS — I10 Essential (primary) hypertension: Secondary | ICD-10-CM | POA: Diagnosis not present

## 2016-08-03 DIAGNOSIS — I4891 Unspecified atrial fibrillation: Secondary | ICD-10-CM | POA: Diagnosis not present

## 2016-08-03 DIAGNOSIS — R009 Unspecified abnormalities of heart beat: Secondary | ICD-10-CM

## 2016-08-03 MED ORDER — RIVAROXABAN 20 MG PO TABS: 20.0000 mg | ORAL_TABLET | Freq: Every day | ORAL | 2 refills | Status: DC

## 2016-08-03 MED ORDER — VERAPAMIL HCL 120 MG PO TABS: 60.0000 mg | ORAL_TABLET | Freq: Every day | ORAL | 2 refills | Status: DC

## 2016-08-03 NOTE — Patient Instructions (Signed)
Medication Instructions: Your physician has recommended you make the following change in your medication:  --1 DECREASE Verapamil  - Take 60 mg by mouth daily    Labwork: None Ordered  Procedures/Testing: Your physician has requested that you have an echocardiogram. Echocardiography is a painless test that uses sound waves to create images of your heart. It provides your doctor with information about the size and shape of your heart and how well your heart's chambers and valves are working. This procedure takes approximately one hour. There are no restrictions for this procedure.    Follow-Up: Your physician recommends that you schedule a follow-up appointment in: 4 Weeks with Dr. Rayann Heman.   Any Additional Special Instructions Will Be Listed Below (If Applicable).     If you need a refill on your cardiac medications before your next appointment, please call your pharmacy.

## 2016-08-03 NOTE — Progress Notes (Signed)
Primary Care Physician: Alesia Richards, MD Referring Physician: Tank Sayani is a 71 y.o. male with recently diagnosed paroxysmal atrial fibrillation who presents for consultation in the [facility].  The patient was initially diagnosed with atrial fibrillation earlier this month after presenting with symptoms of URI.  He was found to have an irregular HR and EKG demonstrated atrial fibrillation. He has recently undergone rotator cuff repair.  He subsequently had the norovirus and then developed an URI.  He has been intermittently on Prednisone for the last 2-3 weeks. He has no awareness of his atrial fibrillation and does not know when he went back to SR.  He was started on Xarelto and Verapamil increased when AF was diagnosed.   Today, he denies symptoms of palpitations, chest pain, shortness of breath, orthopnea, PND, lower extremity edema, dizziness, presyncope, syncope, snoring, daytime somnolence, bleeding, or neurologic sequela. The patient is tolerating medications without difficulties and is otherwise without complaint today.    Atrial Fibrillation Risk Factors:  he does not have symptoms or diagnosis of sleep apnea.  he does not have a history of rheumatic fever.  he does have a history of alcohol use.  he has a BMI of Body mass index is 31.63 kg/m.Marland Kitchen Filed Weights   08/03/16 1515  Weight: 233 lb 3.2 oz (105.8 kg)    LA size: unknown   Atrial Fibrillation Management history:  Previous antiarrhythmic drugs: none  Previous cardioversions: none  Previous ablations: none  CHADS2VASC score: 2  Anticoagulation history: Xarelto   Past Medical History:  Diagnosis Date  . Arthritis   . BPH (benign prostatic hypertrophy)   . Frequency of urination   . Hyperlipidemia   . Hypertension   . IBS (irritable bowel syndrome)   . Mild acid reflux occasional  . Nocturia   . Obesity   . Pelvic pain in male   . Pre-diabetes    . Urgency of urination   . Vitamin D deficiency   . Wears glasses    Past Surgical History:  Procedure Laterality Date  . ANTERIOR CERVICAL DECOMP/DISCECTOMY FUSION  01-25-2002   C4 - C5  . ANTERIOR CRUCIATE LIGAMENT REPAIR  1990   RIGHT  . APPENDECTOMY  02-07-2007  . CERVICAL FUSION  2000   C5 - 6  . COLONOSCOPY  05-30-2011   HEMORRHOIDS  . HIP SURGERY  1999   LEFT HIP --  REMOVAL CALCIUM BUILD-UP  . LEFT ANKLE SURG.  1980  . LEFT INGUINAL HERNIA REPAIR  1970  . LEFT ROTATOR CUFF REPAIR  2002  . TENDON RECONSTRUCTION Left 07/17/2013   Procedure: LEFT ELBOW LATERAL RECONSTRUCTION;  Surgeon: Schuyler Amor, MD;  Location: [facility];  Service: Orthopedics;  Laterality: Left;  . TONSILLECTOMY    . TRIGGER FINGER RELEASE Right 07/17/2013   Procedure: RELEASE A-1 PULLEY RIGHT RING FINGER;  Surgeon: Schuyler Amor, MD;  Location: [facility];  Service: Orthopedics;  Laterality: Right;  . UMBILICAL HERNIA REPAIR  2003    Current Outpatient Prescriptions  Medication Sig Dispense Refill  . albuterol (VENTOLIN HFA) 108 (90 Base) MCG/ACT inhaler Inhale 2 puffs into the lungs every 4 (four) hours as needed for wheezing or shortness of breath. 1 Inhaler 0  . ALPRAZolam (XANAX) 1 MG tablet Taken 1/2  to 1 tablet 3 x/day if needed for anxiety 90 tablet 1  . Cholecalciferol (VITAMIN D-3) 5000 UNITS TABS Take 5,000 Units by mouth  daily.     . finasteride (PROSCAR) 5 MG tablet Take 5 mg by mouth daily.    Marland Kitchen levofloxacin (LEVAQUIN) 500 MG tablet Take 1 tablet (500 mg total) by mouth daily. 10 tablet 0  . losartan (COZAAR) 50 MG tablet Take 1 tablet (50 mg total) by mouth daily. 90 tablet 3  . Multiple Vitamin (MULTIVITAMIN) tablet Take 1 tablet by mouth daily.    Marland Kitchen RAPAFLO 8 MG CAPS capsule Take 8 mg by mouth as needed (urinary rentention).     . rivaroxaban (XARELTO) 20 MG TABS tablet Take 1 tablet (20 mg total) by mouth daily with supper. 30 tablet 3  .  rosuvastatin (CRESTOR) 40 MG tablet Take 1 tablet (40 mg total) by mouth daily. 90 tablet 4  . tadalafil (CIALIS) 20 MG tablet Take 1/2 to 1 tablet daily or as directed 90 tablet 2  . verapamil (CALAN) 120 MG tablet TAKE 1 TABLET BY MOUTH AT BEDTIME 90 tablet 0  . vitamin C (ASCORBIC ACID) 500 MG tablet Take 500 mg by mouth daily.     No current facility-administered medications for this visit.     Allergies  Allergen Reactions  . Clonazepam Other (See Comments)    "mean"  . Flomax [Tamsulosin Hcl] Other (See Comments)    hypotension  . Lipitor [Atorvastatin] Other (See Comments)    Myalgias    Social History   Social History  . Marital status: Married    Spouse name: N/A  . Number of children: 1  . Years of education: N/A   Occupational History  . Business Consultant    Social History Main Topics  . Smoking status: Never Smoker  . Smokeless tobacco: Never Used  . Alcohol use 2.4 oz/week    4 Standard drinks or equivalent per week     Comment: OCCASIONAL  . Drug use: No  . Sexual activity: Not on file   Other Topics Concern  . Not on file   Social History Narrative  . No narrative on file    Family History  Problem Relation Age of Onset  . Heart disease Mother   . Stroke Mother   . Heart disease Father   . Diabetes Father    The patient does not have a history of early familial atrial fibrillation or other arrhythmias.  ROS- All systems are reviewed and negative except as per the HPI above.  Physical Exam: Vitals:   08/03/16 1515  BP: 130/74  Pulse: 85  SpO2: 98%  Weight: 233 lb 3.2 oz (105.8 kg)  Height: 6' (1.829 m)    GEN- The patient is well appearing, alert and oriented x 3 today.   Head- normocephalic, atraumatic Eyes-  Sclera clear, conjunctiva pink Ears- hearing intact Oropharynx- clear Neck- supple  Lungs- Clear to ausculation bilaterally, normal work of breathing Heart- Regular rate and rhythm, no murmurs, rubs or gallops  GI- soft,  NT, ND, + BS Extremities- no clubbing, cyanosis, or edema MS- no significant deformity or atrophy Skin- no rash or lesion Psych- euthymic mood, full affect Neuro- strength and sensation are intact  Wt Readings from Last 3 Encounters:  08/03/16 233 lb 3.2 oz (105.8 kg)  08/01/16 242 lb 12.8 oz (110.1 kg)  07/25/16 243 lb (110.2 kg)    EKG today demonstrates sinus rhythm  Epic records are reviewed at length today  Assessment and Plan:  1. Paroxysmal atrial fibrillation The patient was discovered to have new onset paroxysmal asymptomatic atrial fibrillation in the  setting of prolonged illness (norovirus followed by URI) with treatment with steroids.  He has been appropriately anticoagulated for CHADS2VASC of 2.  He is concerned about taking [place] long term.  Update echo Will continue Xarelto for now. Follow-up in 4 weeks.  If he remains in SR at that time, consider ILR implant to monitor for recurrent atrial fibrillation to help decide long term anticoagulation plan.  2. HTN Stable No change required today  Plan: echo, follow up with me in 4 weeks.   Thompson Grayer, MD 08/03/2016 3:51 PM

## 2016-08-08 ENCOUNTER — Institutional Professional Consult (permissible substitution): Payer: BLUE CROSS/BLUE SHIELD | Admitting: Internal Medicine

## 2016-08-09 DIAGNOSIS — R310 Gross hematuria: Secondary | ICD-10-CM | POA: Diagnosis not present

## 2016-08-10 ENCOUNTER — Ambulatory Visit: Payer: Self-pay | Admitting: Physician Assistant

## 2016-08-15 DIAGNOSIS — H524 Presbyopia: Secondary | ICD-10-CM | POA: Diagnosis not present

## 2016-08-15 DIAGNOSIS — S46011D Strain of muscle(s) and tendon(s) of the rotator cuff of right shoulder, subsequent encounter: Secondary | ICD-10-CM | POA: Diagnosis not present

## 2016-08-15 DIAGNOSIS — H2513 Age-related nuclear cataract, bilateral: Secondary | ICD-10-CM | POA: Diagnosis not present

## 2016-08-15 DIAGNOSIS — M7521 Bicipital tendinitis, right shoulder: Secondary | ICD-10-CM | POA: Diagnosis not present

## 2016-08-15 DIAGNOSIS — H53001 Unspecified amblyopia, right eye: Secondary | ICD-10-CM | POA: Diagnosis not present

## 2016-08-15 DIAGNOSIS — M25511 Pain in right shoulder: Secondary | ICD-10-CM | POA: Diagnosis not present

## 2016-08-15 DIAGNOSIS — H01001 Unspecified blepharitis right upper eyelid: Secondary | ICD-10-CM | POA: Diagnosis not present

## 2016-08-16 DIAGNOSIS — M25511 Pain in right shoulder: Secondary | ICD-10-CM | POA: Diagnosis not present

## 2016-08-17 ENCOUNTER — Ambulatory Visit (HOSPITAL_COMMUNITY): Payer: Medicare Other | Attending: Internal Medicine

## 2016-08-17 ENCOUNTER — Other Ambulatory Visit: Payer: Self-pay

## 2016-08-17 DIAGNOSIS — I4891 Unspecified atrial fibrillation: Secondary | ICD-10-CM | POA: Insufficient documentation

## 2016-08-17 DIAGNOSIS — I340 Nonrheumatic mitral (valve) insufficiency: Secondary | ICD-10-CM | POA: Insufficient documentation

## 2016-08-22 ENCOUNTER — Other Ambulatory Visit: Payer: Self-pay | Admitting: *Deleted

## 2016-08-22 MED ORDER — RIVAROXABAN 20 MG PO TABS: 20.0000 mg | ORAL_TABLET | Freq: Every day | ORAL | 2 refills | Status: DC

## 2016-08-22 NOTE — Addendum Note (Signed)
Addended by: Vicie Mutters R on: 08/22/2016 01:34 PM   Modules accepted: Orders

## 2016-08-24 ENCOUNTER — Other Ambulatory Visit: Payer: Self-pay | Admitting: *Deleted

## 2016-08-24 MED ORDER — ROSUVASTATIN CALCIUM 40 MG PO TABS: 40.0000 mg | ORAL_TABLET | Freq: Every day | ORAL | 4 refills | Status: DC

## 2016-08-29 DIAGNOSIS — M25511 Pain in right shoulder: Secondary | ICD-10-CM | POA: Diagnosis not present

## 2016-08-29 DIAGNOSIS — M7521 Bicipital tendinitis, right shoulder: Secondary | ICD-10-CM | POA: Diagnosis not present

## 2016-08-29 DIAGNOSIS — S46011D Strain of muscle(s) and tendon(s) of the rotator cuff of right shoulder, subsequent encounter: Secondary | ICD-10-CM | POA: Diagnosis not present

## 2016-09-05 ENCOUNTER — Ambulatory Visit (INDEPENDENT_AMBULATORY_CARE_PROVIDER_SITE_OTHER): Payer: Medicare Other | Admitting: Internal Medicine

## 2016-09-05 ENCOUNTER — Encounter: Payer: Self-pay | Admitting: Internal Medicine

## 2016-09-05 VITALS — BP 122/78 | HR 63 | Ht 72.0 in | Wt 236.0 lb

## 2016-09-05 DIAGNOSIS — M7521 Bicipital tendinitis, right shoulder: Secondary | ICD-10-CM | POA: Diagnosis not present

## 2016-09-05 DIAGNOSIS — I480 Paroxysmal atrial fibrillation: Secondary | ICD-10-CM

## 2016-09-05 DIAGNOSIS — S46011D Strain of muscle(s) and tendon(s) of the rotator cuff of right shoulder, subsequent encounter: Secondary | ICD-10-CM | POA: Diagnosis not present

## 2016-09-05 DIAGNOSIS — M25511 Pain in right shoulder: Secondary | ICD-10-CM | POA: Diagnosis not present

## 2016-09-05 DIAGNOSIS — I10 Essential (primary) hypertension: Secondary | ICD-10-CM

## 2016-09-05 NOTE — Progress Notes (Signed)
  Primary Care Physician: MCKEOWN,WILLIAM DAVID, MD Referring Physician: McKeown   [name] is a 70 y.o. male with recently diagnosed paroxysmal atrial fibrillation who presents for EP follow-up.  The patient was initially diagnosed with atrial fibrillation several months ago after presenting with symptoms of URI.   He has no awareness of his atrial fibrillation and does not know when he went back to SR.  He was started on Xarelto and Verapamil increased when AF was diagnosed.  He has had no further symptoms of afib.  Today, he denies symptoms of palpitations, chest pain, shortness of breath, orthopnea, PND, lower extremity edema, dizziness, presyncope, syncope, snoring, daytime somnolence, bleeding, or neurologic sequela. The patient is tolerating medications without difficulties and is otherwise without complaint today.    Atrial Fibrillation Risk Factors:  he does not have symptoms or diagnosis of sleep apnea.  he does not have a history of rheumatic fever.  he does have a history of alcohol use.  he has a BMI of Body mass index is 32.01 kg/m.. Filed Weights   09/05/16 1600  Weight: 236 lb (107 kg)    LA size: unknown   Atrial Fibrillation Management history:  Previous antiarrhythmic drugs: none  Previous cardioversions: none  Previous ablations: none  CHADS2VASC score: 2  Anticoagulation history: Xarelto   Past Medical History:  Diagnosis Date  . Arthritis   . BPH (benign prostatic hypertrophy)   . Frequency of urination   . Hyperlipidemia   . Hypertension   . IBS (irritable bowel syndrome)   . Mild acid reflux occasional  . Nocturia   . Obesity   . Pelvic pain in male   . Pre-diabetes   . Urgency of urination   . Vitamin D deficiency   . Wears glasses    Past Surgical History:  Procedure Laterality Date  . ANTERIOR CERVICAL DECOMP/DISCECTOMY FUSION  01-25-2002   C4 - C5  . ANTERIOR CRUCIATE LIGAMENT REPAIR  1990   RIGHT  . APPENDECTOMY   02-07-2007  . CERVICAL FUSION  2000   C5 - 6  . COLONOSCOPY  05-30-2011   HEMORRHOIDS  . HIP SURGERY  1999   LEFT HIP --  REMOVAL CALCIUM BUILD-UP  . LEFT ANKLE SURG.  1980  . LEFT INGUINAL HERNIA REPAIR  1970  . LEFT ROTATOR CUFF REPAIR  2002  . TENDON RECONSTRUCTION Left 07/17/2013   Procedure: LEFT ELBOW LATERAL RECONSTRUCTION;  Surgeon: Matthew A Weingold, MD;  Location: [facility];  Service: Orthopedics;  Laterality: Left;  . TONSILLECTOMY    . TRIGGER FINGER RELEASE Right 07/17/2013   Procedure: RELEASE A-1 PULLEY RIGHT RING FINGER;  Surgeon: Matthew A Weingold, MD;  Location: [facility];  Service: Orthopedics;  Laterality: Right;  . UMBILICAL HERNIA REPAIR  2003    Current Outpatient Prescriptions  Medication Sig Dispense Refill  . ALPRAZolam (XANAX) 1 MG tablet Taken 1/2  to 1 tablet 3 x/day if needed for anxiety 90 tablet 1  . Cholecalciferol (VITAMIN D-3) 5000 UNITS TABS Take 5,000 Units by mouth daily.     . finasteride (PROSCAR) 5 MG tablet Take 5 mg by mouth daily.    . losartan (COZAAR) 50 MG tablet Take 1 tablet (50 mg total) by mouth daily. 90 tablet 3  . Multiple Vitamin (MULTIVITAMIN) tablet Take 1 tablet by mouth daily.    . RAPAFLO 8 MG CAPS capsule Take 8 mg by mouth as needed (urinary rentention). Take as directed    .   rivaroxaban (XARELTO) 20 MG TABS tablet Take 1 tablet (20 mg total) by mouth daily with supper. 90 tablet 2  . rosuvastatin (CRESTOR) 40 MG tablet Take 1 tablet (40 mg total) by mouth daily. (Patient taking differently: Take 20 mg by mouth daily. ) 90 tablet 4  . tadalafil (CIALIS) 20 MG tablet Take 1/2 to 1 tablet daily or as directed 90 tablet 2  . verapamil (CALAN) 120 MG tablet Take 0.5 tablets (60 mg total) by mouth at bedtime. 45 tablet 2  . vitamin C (ASCORBIC ACID) 500 MG tablet Take 500 mg by mouth daily.     No current facility-administered medications for this visit.     Allergies  Allergen Reactions  .  Clonazepam Other (See Comments)    "mean"  . Flomax [Tamsulosin Hcl] Other (See Comments)    hypotension  . Lipitor [Atorvastatin] Other (See Comments)    Myalgias    Social History   Social History  . Marital status: Married    Spouse name: N/A  . Number of children: 1  . Years of education: N/A   Occupational History  . Business Consultant    Social History Main Topics  . Smoking status: Never Smoker  . Smokeless tobacco: Never Used  . Alcohol use 2.4 oz/week    4 Standard drinks or equivalent per week     Comment: OCCASIONAL  . Drug use: No  . Sexual activity: Not on file   Other Topics Concern  . Not on file   Social History Narrative  . No narrative on file    Family History  Problem Relation Age of Onset  . Heart disease Mother   . Stroke Mother   . Heart disease Father   . Diabetes Father    The patient does not have a history of early familial atrial fibrillation or other arrhythmias.  ROS- All systems are reviewed and negative except as per the HPI above.  Physical Exam: Vitals:   09/05/16 1600  BP: 122/78  Pulse: 63  SpO2: 97%  Weight: 236 lb (107 kg)  Height: 6' (1.829 m)    GEN- The patient is well appearing, alert and oriented x 3 today.   Head- normocephalic, atraumatic Eyes-  Sclera clear, conjunctiva pink Ears- hearing intact Oropharynx- clear Neck- supple  Lungs- Clear to ausculation bilaterally, normal work of breathing Heart- Regular rate and rhythm, no murmurs, rubs or gallops  GI- soft, NT, ND, + BS Extremities- no clubbing, cyanosis, or edema MS- no significant deformity or atrophy Skin- no rash or lesion Psych- euthymic mood, full affect Neuro- strength and sensation are intact  Wt Readings from Last 3 Encounters:  09/05/16 236 lb (107 kg)  08/03/16 233 lb 3.2 oz (105.8 kg)  08/01/16 242 lb 12.8 oz (110.1 kg)    EKG today is personally reviewed and demonstrates sinus rhythm  Echo is reviewed with the patient  today  Assessment and Plan:  1. Paroxysmal atrial fibrillation The patient was discovered to have new onset paroxysmal asymptomatic atrial fibrillation in the setting of prolonged illness (norovirus followed by URI) with treatment with steroids.  He has been appropriately anticoagulated for CHADS2VASC of 2.  He is concerned about taking OAC long term.  Update echo We discussed ILR placement for further afib management and to direct anticoagulation therapy.  If he has further AF, then I would advise long term anticoagulation.  If he does not have afib then perhaps his OAC could be discontinued.  2. HTN   Stable No change required today  Return to see me in 2 months  Derald Lorge, MD 09/05/2016 4:23 PM 

## 2016-09-05 NOTE — Patient Instructions (Addendum)
Medication Instructions:  Your physician recommends that you continue on your current medications as directed. Please refer to the Current Medication list given to you today.   Labwork: None ordered   Testing/Procedures: LINQ implant---09/19/16 Please check in at [facility] Entrance at 6:30am  Follow-Up:  Your physician recommends that you schedule a follow-up appointment in: 10-14 days from 09/19/16 in device clinic for wound check   Your physician recommends that you schedule a follow-up appointment in: 2 months with Dr Rayann Heman   Any Other Special Instructions Will Be Listed Below (If Applicable).     If you need a refill on your cardiac medications before your next appointment, please call your pharmacy.

## 2016-09-06 ENCOUNTER — Other Ambulatory Visit: Payer: Self-pay | Admitting: *Deleted

## 2016-09-06 DIAGNOSIS — R339 Retention of urine, unspecified: Secondary | ICD-10-CM | POA: Diagnosis not present

## 2016-09-06 DIAGNOSIS — F411 Generalized anxiety disorder: Secondary | ICD-10-CM

## 2016-09-06 DIAGNOSIS — R310 Gross hematuria: Secondary | ICD-10-CM | POA: Diagnosis not present

## 2016-09-06 DIAGNOSIS — N5201 Erectile dysfunction due to arterial insufficiency: Secondary | ICD-10-CM | POA: Diagnosis not present

## 2016-09-06 MED ORDER — LOSARTAN POTASSIUM 50 MG PO TABS: 50.0000 mg | ORAL_TABLET | Freq: Every day | ORAL | 3 refills | Status: DC

## 2016-09-06 MED ORDER — ALPRAZOLAM 1 MG PO TABS: ORAL_TABLET | ORAL | 0 refills | Status: DC

## 2016-09-15 DIAGNOSIS — M7521 Bicipital tendinitis, right shoulder: Secondary | ICD-10-CM | POA: Diagnosis not present

## 2016-09-15 DIAGNOSIS — M25511 Pain in right shoulder: Secondary | ICD-10-CM | POA: Diagnosis not present

## 2016-09-15 DIAGNOSIS — S46011D Strain of muscle(s) and tendon(s) of the rotator cuff of right shoulder, subsequent encounter: Secondary | ICD-10-CM | POA: Diagnosis not present

## 2016-09-19 ENCOUNTER — Ambulatory Visit (HOSPITAL_COMMUNITY)
Admission: RE | Admit: 2016-09-19 | Discharge: 2016-09-19 | Disposition: A | Discharge status: Home / Self Care | Payer: Medicare Other | Source: Ambulatory Visit | Attending: Internal Medicine | Admitting: Internal Medicine

## 2016-09-19 ENCOUNTER — Encounter (HOSPITAL_COMMUNITY)
Admission: RE | Disposition: A | Discharge status: Home / Self Care | Payer: Self-pay | Source: Ambulatory Visit | Attending: Internal Medicine

## 2016-09-19 ENCOUNTER — Encounter (HOSPITAL_COMMUNITY): Payer: Self-pay | Admitting: Internal Medicine

## 2016-09-19 DIAGNOSIS — R3915 Urgency of urination: Secondary | ICD-10-CM | POA: Insufficient documentation

## 2016-09-19 DIAGNOSIS — Z833 Family history of diabetes mellitus: Secondary | ICD-10-CM | POA: Diagnosis not present

## 2016-09-19 DIAGNOSIS — E785 Hyperlipidemia, unspecified: Secondary | ICD-10-CM | POA: Insufficient documentation

## 2016-09-19 DIAGNOSIS — R002 Palpitations: Secondary | ICD-10-CM | POA: Diagnosis not present

## 2016-09-19 DIAGNOSIS — Z8249 Family history of ischemic heart disease and other diseases of the circulatory system: Secondary | ICD-10-CM | POA: Diagnosis not present

## 2016-09-19 DIAGNOSIS — K589 Irritable bowel syndrome without diarrhea: Secondary | ICD-10-CM | POA: Diagnosis not present

## 2016-09-19 DIAGNOSIS — R7303 Prediabetes: Secondary | ICD-10-CM | POA: Insufficient documentation

## 2016-09-19 DIAGNOSIS — K219 Gastro-esophageal reflux disease without esophagitis: Secondary | ICD-10-CM | POA: Insufficient documentation

## 2016-09-19 DIAGNOSIS — R350 Frequency of micturition: Secondary | ICD-10-CM | POA: Insufficient documentation

## 2016-09-19 DIAGNOSIS — E559 Vitamin D deficiency, unspecified: Secondary | ICD-10-CM | POA: Diagnosis not present

## 2016-09-19 DIAGNOSIS — I480 Paroxysmal atrial fibrillation: Secondary | ICD-10-CM | POA: Insufficient documentation

## 2016-09-19 DIAGNOSIS — I10 Essential (primary) hypertension: Secondary | ICD-10-CM | POA: Insufficient documentation

## 2016-09-19 DIAGNOSIS — Z823 Family history of stroke: Secondary | ICD-10-CM | POA: Diagnosis not present

## 2016-09-19 DIAGNOSIS — N401 Enlarged prostate with lower urinary tract symptoms: Secondary | ICD-10-CM | POA: Diagnosis not present

## 2016-09-19 DIAGNOSIS — I4891 Unspecified atrial fibrillation: Secondary | ICD-10-CM | POA: Diagnosis present

## 2016-09-19 DIAGNOSIS — Z6832 Body mass index (BMI) 32.0-32.9, adult: Secondary | ICD-10-CM | POA: Diagnosis not present

## 2016-09-19 DIAGNOSIS — Z7901 Long term (current) use of anticoagulants: Secondary | ICD-10-CM | POA: Diagnosis not present

## 2016-09-19 DIAGNOSIS — E669 Obesity, unspecified: Secondary | ICD-10-CM | POA: Diagnosis not present

## 2016-09-19 HISTORY — PX: LOOP RECORDER INSERTION: EP1214

## 2016-09-19 SURGERY — LOOP RECORDER INSERTION

## 2016-09-19 MED ORDER — LIDOCAINE HCL (PF) 1 % IJ SOLN
INTRAMUSCULAR | Status: DC | PRN
  Administered 2016-09-19: 5 mL via SUBCUTANEOUS

## 2016-09-19 MED ORDER — LIDOCAINE HCL (PF) 1 % IJ SOLN
INTRAMUSCULAR | Status: AC
  Filled 2016-09-19: qty 60

## 2016-09-19 SURGICAL SUPPLY — 2 items
LOOP REVEAL LINQSYS (Prosthesis & Implant Heart) ×2 IMPLANT
PACK LOOP INSERTION (CUSTOM PROCEDURE TRAY) ×3 IMPLANT

## 2016-09-19 NOTE — H&P (View-Only) (Signed)
Primary Care Physician: Alesia Richards, MD Referring Physician: Yosmar Ryker is a 71 y.o. male with recently diagnosed paroxysmal atrial fibrillation who presents for EP follow-up.  The patient was initially diagnosed with atrial fibrillation several months ago after presenting with symptoms of URI.   He has no awareness of his atrial fibrillation and does not know when he went back to SR.  He was started on Xarelto and Verapamil increased when AF was diagnosed.  He has had no further symptoms of afib.  Today, he denies symptoms of palpitations, chest pain, shortness of breath, orthopnea, PND, lower extremity edema, dizziness, presyncope, syncope, snoring, daytime somnolence, bleeding, or neurologic sequela. The patient is tolerating medications without difficulties and is otherwise without complaint today.    Atrial Fibrillation Risk Factors:  he does not have symptoms or diagnosis of sleep apnea.  he does not have a history of rheumatic fever.  he does have a history of alcohol use.  he has a BMI of Body mass index is 32.01 kg/m.Marland Kitchen Filed Weights   09/05/16 1600  Weight: 236 lb (107 kg)    LA size: unknown   Atrial Fibrillation Management history:  Previous antiarrhythmic drugs: none  Previous cardioversions: none  Previous ablations: none  CHADS2VASC score: 2  Anticoagulation history: Xarelto   Past Medical History:  Diagnosis Date  . Arthritis   . BPH (benign prostatic hypertrophy)   . Frequency of urination   . Hyperlipidemia   . Hypertension   . IBS (irritable bowel syndrome)   . Mild acid reflux occasional  . Nocturia   . Obesity   . Pelvic pain in male   . Pre-diabetes   . Urgency of urination   . Vitamin D deficiency   . Wears glasses    Past Surgical History:  Procedure Laterality Date  . ANTERIOR CERVICAL DECOMP/DISCECTOMY FUSION  01-25-2002   C4 - C5  . ANTERIOR CRUCIATE LIGAMENT REPAIR  1990   RIGHT  . APPENDECTOMY   02-07-2007  . CERVICAL FUSION  2000   C5 - 6  . COLONOSCOPY  05-30-2011   HEMORRHOIDS  . HIP SURGERY  1999   LEFT HIP --  REMOVAL CALCIUM BUILD-UP  . LEFT ANKLE SURG.  1980  . LEFT INGUINAL HERNIA REPAIR  1970  . LEFT ROTATOR CUFF REPAIR  2002  . TENDON RECONSTRUCTION Left 07/17/2013   Procedure: LEFT ELBOW LATERAL RECONSTRUCTION;  Surgeon: Schuyler Amor, MD;  Location: [facility];  Service: Orthopedics;  Laterality: Left;  . TONSILLECTOMY    . TRIGGER FINGER RELEASE Right 07/17/2013   Procedure: RELEASE A-1 PULLEY RIGHT RING FINGER;  Surgeon: Schuyler Amor, MD;  Location: [facility];  Service: Orthopedics;  Laterality: Right;  . UMBILICAL HERNIA REPAIR  2003    Current Outpatient Prescriptions  Medication Sig Dispense Refill  . ALPRAZolam (XANAX) 1 MG tablet Taken 1/2  to 1 tablet 3 x/day if needed for anxiety 90 tablet 1  . Cholecalciferol (VITAMIN D-3) 5000 UNITS TABS Take 5,000 Units by mouth daily.     . finasteride (PROSCAR) 5 MG tablet Take 5 mg by mouth daily.    Marland Kitchen losartan (COZAAR) 50 MG tablet Take 1 tablet (50 mg total) by mouth daily. 90 tablet 3  . Multiple Vitamin (MULTIVITAMIN) tablet Take 1 tablet by mouth daily.    Marland Kitchen RAPAFLO 8 MG CAPS capsule Take 8 mg by mouth as needed (urinary rentention). Take as directed    .  rivaroxaban (XARELTO) 20 MG TABS tablet Take 1 tablet (20 mg total) by mouth daily with supper. 90 tablet 2  . rosuvastatin (CRESTOR) 40 MG tablet Take 1 tablet (40 mg total) by mouth daily. (Patient taking differently: Take 20 mg by mouth daily. ) 90 tablet 4  . tadalafil (CIALIS) 20 MG tablet Take 1/2 to 1 tablet daily or as directed 90 tablet 2  . verapamil (CALAN) 120 MG tablet Take 0.5 tablets (60 mg total) by mouth at bedtime. 45 tablet 2  . vitamin C (ASCORBIC ACID) 500 MG tablet Take 500 mg by mouth daily.     No current facility-administered medications for this visit.     Allergies  Allergen Reactions  .  Clonazepam Other (See Comments)    "mean"  . Flomax [Tamsulosin Hcl] Other (See Comments)    hypotension  . Lipitor [Atorvastatin] Other (See Comments)    Myalgias    Social History   Social History  . Marital status: Married    Spouse name: N/A  . Number of children: 1  . Years of education: N/A   Occupational History  . Business Consultant    Social History Main Topics  . Smoking status: Never Smoker  . Smokeless tobacco: Never Used  . Alcohol use 2.4 oz/week    4 Standard drinks or equivalent per week     Comment: OCCASIONAL  . Drug use: No  . Sexual activity: Not on file   Other Topics Concern  . Not on file   Social History Narrative  . No narrative on file    Family History  Problem Relation Age of Onset  . Heart disease Mother   . Stroke Mother   . Heart disease Father   . Diabetes Father    The patient does not have a history of early familial atrial fibrillation or other arrhythmias.  ROS- All systems are reviewed and negative except as per the HPI above.  Physical Exam: Vitals:   09/05/16 1600  BP: 122/78  Pulse: 63  SpO2: 97%  Weight: 236 lb (107 kg)  Height: 6' (1.829 m)    GEN- The patient is well appearing, alert and oriented x 3 today.   Head- normocephalic, atraumatic Eyes-  Sclera clear, conjunctiva pink Ears- hearing intact Oropharynx- clear Neck- supple  Lungs- Clear to ausculation bilaterally, normal work of breathing Heart- Regular rate and rhythm, no murmurs, rubs or gallops  GI- soft, NT, ND, + BS Extremities- no clubbing, cyanosis, or edema MS- no significant deformity or atrophy Skin- no rash or lesion Psych- euthymic mood, full affect Neuro- strength and sensation are intact  Wt Readings from Last 3 Encounters:  09/05/16 236 lb (107 kg)  08/03/16 233 lb 3.2 oz (105.8 kg)  08/01/16 242 lb 12.8 oz (110.1 kg)    EKG today is personally reviewed and demonstrates sinus rhythm  Echo is reviewed with the patient  today  Assessment and Plan:  1. Paroxysmal atrial fibrillation The patient was discovered to have new onset paroxysmal asymptomatic atrial fibrillation in the setting of prolonged illness (norovirus followed by URI) with treatment with steroids.  He has been appropriately anticoagulated for CHADS2VASC of 2.  He is concerned about taking [place] long term.  Update echo We discussed ILR placement for further afib management and to direct anticoagulation therapy.  If he has further AF, then I would advise long term anticoagulation.  If he does not have afib then perhaps his [place] could be discontinued.  2. HTN  Stable No change required today  Return to see me in 2 months  Thompson Grayer, MD 09/05/2016 4:23 PM

## 2016-09-19 NOTE — Interval H&P Note (Signed)
History and Physical Interval Note:  09/19/2016 7:23 AM  [name]  has presented today for surgery, with the diagnosis of afib  The various methods of treatment have been discussed with the patient and family. After consideration of risks, benefits and other options for treatment, the patient has consented to  Procedure(s): Loop Recorder Insertion (N/A) as a surgical intervention .  The patient's history has been reviewed, patient examined, no change in status, stable for surgery.  I have reviewed the patient's chart and labs.  Questions were answered to the patient's satisfaction.     Thompson Grayer

## 2016-09-20 ENCOUNTER — Encounter: Payer: Self-pay | Admitting: Podiatry

## 2016-09-20 ENCOUNTER — Ambulatory Visit (INDEPENDENT_AMBULATORY_CARE_PROVIDER_SITE_OTHER): Payer: Medicare Other | Admitting: Podiatry

## 2016-09-20 ENCOUNTER — Ambulatory Visit (INDEPENDENT_AMBULATORY_CARE_PROVIDER_SITE_OTHER): Payer: Medicare Other

## 2016-09-20 DIAGNOSIS — M779 Enthesopathy, unspecified: Secondary | ICD-10-CM

## 2016-09-20 DIAGNOSIS — M79672 Pain in left foot: Secondary | ICD-10-CM

## 2016-09-20 DIAGNOSIS — M7521 Bicipital tendinitis, right shoulder: Secondary | ICD-10-CM | POA: Diagnosis not present

## 2016-09-20 DIAGNOSIS — G5792 Unspecified mononeuropathy of left lower limb: Secondary | ICD-10-CM

## 2016-09-20 DIAGNOSIS — S46011D Strain of muscle(s) and tendon(s) of the rotator cuff of right shoulder, subsequent encounter: Secondary | ICD-10-CM | POA: Diagnosis not present

## 2016-09-20 DIAGNOSIS — M778 Other enthesopathies, not elsewhere classified: Secondary | ICD-10-CM

## 2016-09-20 DIAGNOSIS — M7752 Other enthesopathy of left foot: Secondary | ICD-10-CM

## 2016-09-20 DIAGNOSIS — M25511 Pain in right shoulder: Secondary | ICD-10-CM | POA: Diagnosis not present

## 2016-09-20 NOTE — Progress Notes (Signed)
   Subjective:    Patient ID: [name], male    DOB: [birth year], 72 y.o.   MRN: [id number]  HPI: He presents today with a chief complaint of a sudden onset of pain to the dorsal aspect of the left foot. States that it's only been going on for about 3 days now but he was a conservative on Friday night and felt pain across the top of his foot and then Saturday was excruciating. He states that even the second toe feels somewhat numb. He states it hurts right in here as he points to the base of the second metatarsal cuneiform joint.    Review of Systems  Eyes: Positive for itching.  Musculoskeletal: Positive for arthralgias.  Hematological: Bruises/bleeds easily.  All other systems reviewed and are negative.      Objective:   Physical Exam: Vital signs are stable alert and oriented 3. Pulses are palpable. No acute distress. Neurologic sensorium is intact he does have pain on palpation of the deep peroneal nerve overlying the cuneiforms before drops off into the first intermetatarsal space. Deep tendon reflexes are intact. Muscle strength is 5 over 5 dorsiflexion plantar flexors and inverters everters on to the musculature is intact. Orthopedic evaluation of his rates all joints distal to the ankle for range of motion without crepitation radiographs do not demonstrate any type of osseus abnormalities no fractures are visualized. He does have a very small spur at the first and second metatarsal cuneiform articulation..        Assessment & Plan:  Deep peroneal nerve neuritis left foot.  Plan: Discussed etiology pathology conservative versus surgical therapies. Discussed lacing of the shoes and I also injected the area today with Kenalog and local anesthetic may need to consider injecting the area with dehydrated alcohol.

## 2016-09-26 DIAGNOSIS — M7521 Bicipital tendinitis, right shoulder: Secondary | ICD-10-CM | POA: Diagnosis not present

## 2016-09-26 DIAGNOSIS — S46011D Strain of muscle(s) and tendon(s) of the rotator cuff of right shoulder, subsequent encounter: Secondary | ICD-10-CM | POA: Diagnosis not present

## 2016-09-26 DIAGNOSIS — M25511 Pain in right shoulder: Secondary | ICD-10-CM | POA: Diagnosis not present

## 2016-09-27 DIAGNOSIS — M25511 Pain in right shoulder: Secondary | ICD-10-CM | POA: Diagnosis not present

## 2016-09-29 ENCOUNTER — Ambulatory Visit (INDEPENDENT_AMBULATORY_CARE_PROVIDER_SITE_OTHER): Payer: Medicare Other | Admitting: *Deleted

## 2016-09-29 DIAGNOSIS — I480 Paroxysmal atrial fibrillation: Secondary | ICD-10-CM

## 2016-09-29 LAB — CUP PACEART INCLINIC DEVICE CHECK
Implantable Pulse Generator Implant Date: 20180312
MDC IDC SESS DTM: 20180322111322

## 2016-09-29 NOTE — Progress Notes (Signed)
Loop wound check in clinic. Steri-strips removed. Wound well healed without redness, swelling or drainage. Battery status: good. R-waves 0.73mV. No episodes detected. Monitoring for AF- history of AF in the setting of URI. Monthly summary reports and ROV with JA 11/30/16.

## 2016-10-04 ENCOUNTER — Encounter: Payer: Self-pay | Admitting: Physician Assistant

## 2016-10-04 ENCOUNTER — Ambulatory Visit (INDEPENDENT_AMBULATORY_CARE_PROVIDER_SITE_OTHER): Payer: Medicare Other | Admitting: Physician Assistant

## 2016-10-04 VITALS — BP 120/88 | HR 66 | Temp 97.7°F | Resp 14 | Ht 72.0 in | Wt 236.8 lb

## 2016-10-04 DIAGNOSIS — S46011D Strain of muscle(s) and tendon(s) of the rotator cuff of right shoulder, subsequent encounter: Secondary | ICD-10-CM | POA: Diagnosis not present

## 2016-10-04 DIAGNOSIS — I10 Essential (primary) hypertension: Secondary | ICD-10-CM | POA: Diagnosis not present

## 2016-10-04 DIAGNOSIS — N401 Enlarged prostate with lower urinary tract symptoms: Secondary | ICD-10-CM

## 2016-10-04 DIAGNOSIS — E6609 Other obesity due to excess calories: Secondary | ICD-10-CM | POA: Diagnosis not present

## 2016-10-04 DIAGNOSIS — K589 Irritable bowel syndrome without diarrhea: Secondary | ICD-10-CM

## 2016-10-04 DIAGNOSIS — Z6833 Body mass index (BMI) 33.0-33.9, adult: Secondary | ICD-10-CM

## 2016-10-04 DIAGNOSIS — I480 Paroxysmal atrial fibrillation: Secondary | ICD-10-CM

## 2016-10-04 DIAGNOSIS — E782 Mixed hyperlipidemia: Secondary | ICD-10-CM | POA: Diagnosis not present

## 2016-10-04 DIAGNOSIS — R350 Frequency of micturition: Secondary | ICD-10-CM

## 2016-10-04 DIAGNOSIS — E559 Vitamin D deficiency, unspecified: Secondary | ICD-10-CM

## 2016-10-04 DIAGNOSIS — Z79899 Other long term (current) drug therapy: Secondary | ICD-10-CM | POA: Diagnosis not present

## 2016-10-04 DIAGNOSIS — R6889 Other general symptoms and signs: Secondary | ICD-10-CM

## 2016-10-04 DIAGNOSIS — Z85828 Personal history of other malignant neoplasm of skin: Secondary | ICD-10-CM

## 2016-10-04 DIAGNOSIS — M7521 Bicipital tendinitis, right shoulder: Secondary | ICD-10-CM | POA: Diagnosis not present

## 2016-10-04 DIAGNOSIS — M25511 Pain in right shoulder: Secondary | ICD-10-CM | POA: Diagnosis not present

## 2016-10-04 DIAGNOSIS — Z0001 Encounter for general adult medical examination with abnormal findings: Secondary | ICD-10-CM | POA: Diagnosis not present

## 2016-10-04 DIAGNOSIS — R7309 Other abnormal glucose: Secondary | ICD-10-CM | POA: Diagnosis not present

## 2016-10-04 DIAGNOSIS — Z6832 Body mass index (BMI) 32.0-32.9, adult: Secondary | ICD-10-CM

## 2016-10-04 HISTORY — DX: Personal history of other malignant neoplasm of skin: Z85.828

## 2016-10-04 LAB — CBC WITH DIFFERENTIAL/PLATELET
Basophils Absolute: 98 cells/uL (ref 0–200)
Basophils Relative: 1 %
Eosinophils Absolute: 294 cells/uL (ref 15–500)
Eosinophils Relative: 3 %
HEMATOCRIT: 47.5 % (ref 38.5–50.0)
Hemoglobin: 15.8 g/dL (ref 13.2–17.1)
LYMPHS PCT: 19 %
Lymphs Abs: 1862 cells/uL (ref 850–3900)
MCH: 30.9 pg (ref 27.0–33.0)
MCHC: 33.3 g/dL (ref 32.0–36.0)
MCV: 93 fL (ref 80.0–100.0)
MONO ABS: 686 {cells}/uL (ref 200–950)
MONOS PCT: 7 %
MPV: 9.8 fL (ref 7.5–12.5)
NEUTROS PCT: 70 %
Neutro Abs: 6860 cells/uL (ref 1500–7800)
PLATELETS: 233 10*3/uL (ref 140–400)
RBC: 5.11 MIL/uL (ref 4.20–5.80)
RDW: 14.4 % (ref 11.0–15.0)
WBC: 9.8 10*3/uL (ref 3.8–10.8)

## 2016-10-04 LAB — BASIC METABOLIC PANEL WITH GFR
BUN: 17 mg/dL (ref 7–25)
CO2: 24 mmol/L (ref 20–31)
Calcium: 9.8 mg/dL (ref 8.6–10.3)
Chloride: 104 mmol/L (ref 98–110)
Creat: 0.84 mg/dL (ref 0.70–1.18)
GFR, EST NON AFRICAN AMERICAN: 89 mL/min (ref >=60)
GFR, Est African American: >89 mL/min (ref >=60)
GLUCOSE: 78 mg/dL (ref 65–99)
Potassium: 4 mmol/L (ref 3.5–5.3)
Sodium: 140 mmol/L (ref 135–146)

## 2016-10-04 LAB — LIPID PANEL
Cholesterol: 136 mg/dL (ref <200)
HDL: 50 mg/dL (ref >40)
LDL Cholesterol: 70 mg/dL (ref <100)
Total CHOL/HDL Ratio: 2.7 Ratio (ref <5.0)
Triglycerides: 82 mg/dL (ref <150)
VLDL: 16 mg/dL (ref <30)

## 2016-10-04 LAB — HEPATIC FUNCTION PANEL
ALBUMIN: 4.4 g/dL (ref 3.6–5.1)
ALT: 24 U/L (ref 9–46)
AST: 19 U/L (ref 10–35)
Alkaline Phosphatase: 66 U/L (ref 40–115)
BILIRUBIN INDIRECT: 0.4 mg/dL (ref 0.2–1.2)
Bilirubin, Direct: 0.1 mg/dL (ref <=0.2)
TOTAL PROTEIN: 7 g/dL (ref 6.1–8.1)
Total Bilirubin: 0.5 mg/dL (ref 0.2–1.2)

## 2016-10-04 LAB — TSH: TSH: 1.03 mIU/L (ref 0.40–4.50)

## 2016-10-04 NOTE — Progress Notes (Signed)
MEDICARE ANNUAL WELLNESS VISIT AND FOLLOW UP Assessment:   Essential hypertension - continue medications, DASH diet, exercise and monitor at home. Call if greater than 130/80.  -     CBC with Differential/Platelet -     BASIC METABOLIC PANEL WITH GFR -     Hepatic function panel -     TSH  Paroxysmal atrial fibrillation (HCC) NSR at this time, following Dr. Rayann Heman, continue xarelto  Mixed hyperlipidemia -continue medications, check lipids, decrease fatty foods, increase activity.  -     Lipid panel  Other abnormal glucose Discussed general issues about diabetes pathophysiology and management., Educational material distributed., Suggested low cholesterol diet., Encouraged aerobic exercise., Discussed foot care., Reminded to get yearly retinal exam.  Medication management -     Magnesium  Vitamin D deficiency Continue supplement  Benign prostatic hyperplasia with urinary frequency Continue follow up urology  Irritable bowel syndrome, unspecified type Diet discussed  Class 1 obesity due to excess calories with serious comorbidity and body mass index (BMI) of 32.0 to 32.9 in adult - long discussion about weight loss, diet, and exercise  BMI 33.0-33.9,adult - long discussion about weight loss, diet, and exercise  History of SCC (squamous cell carcinoma) of skin Follow up [facility]   Future Appointments Date Time Provider Dana  10/18/2016 2:00 PM [place], [place] [facility]  11/30/2016 12:00 PM Thompson Grayer, MD [facility]  02/01/2017 3:00 PM Unk Pinto, MD [facility] None     Plan:   During the course of the visit the patient was educated and counseled about appropriate screening and preventive services including:    Pneumococcal vaccine   Influenza vaccine  Td vaccine  Screening electrocardiogram  Colorectal cancer screening  Diabetes screening  Glaucoma screening  Nutrition counseling    Subjective:  [name] is a 71 y.o. male who presents for Medicare Annual Wellness Visit and 3 month follow up for HTN, hyperlipidemia, and vitamin D Def.   His blood pressure has been controlled at home, today their BP is BP: 120/88 He does workout. He denies chest pain, shortness of breath, dizziness.  Came in for a cold and had pAF, has seen Dr. Rayann Heman since that time, has loop recorder, on xarelto, CHADSVASC 2, has follow up mid May.  Follows with Dr. Tresa Moore for BPH, currently on meloxicam and finesteride, off of bASA for recent prostatitis.  He is on cholesterol medication, on crestor, has increased this and denies myalgias. His cholesterol is at goal. The cholesterol last visit was:   Lab Results  Component Value Date   CHOL 214 (H) 06/30/2016   HDL 36 (L) 06/30/2016   LDLCALC 126 (H) 06/30/2016   TRIG 262 (H) 06/30/2016   CHOLHDL 5.9 (H) 06/30/2016    Had elevated A1C in 2013, has done well with diet/exercise. Last A1C in the office was:  Lab Results  Component Value Date   HGBA1C 5.2 06/30/2016   Patient is on Vitamin D supplement.   Lab Results  Component Value Date   VD25OH 52 06/30/2016     Has had lower back pain x 1 week and bilateral hip pain, with some left lower back pain, follows with Dr. Noemi Chapel, had recent injection right knee.   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  BMI is Body mass index is 32.12 kg/m., he is working on diet and exercise. Wt Readings from Last 3 Encounters:  10/04/16  236 lb 12.8 oz (107.4 kg)  09/19/16 228 lb (103.4 kg)  09/05/16 236 lb (107 kg)   Names of Other Physician/Practitioners you currently use: 1. [facility] here for primary care 2. Dr. Sherral Hammers, eye doctor, visit yearly 08/2015 3. Dr. Aida Puffer, dentist, q 6 months June 2017 4. [facility], [place] for prostatitis. Currently on meloxicam and finesteride, off of bASA Patient Care Team: Unk Pinto, MD as PCP - General (Internal Medicine) Charlotte Crumb, MD as Consulting Physician (Orthopedic Surgery) Inda Castle, MD as Consulting Physician (Gastroenterology) Alexis Frock, MD as Consulting Physician (Urology) Thompson Grayer, MD as Consulting Physician (Cardiology)  Medication Review: Current Outpatient Prescriptions on File Prior to Visit  Medication Sig Dispense Refill  . ALPRAZolam (XANAX) 1 MG tablet Taken 1/2  to 1 tablet 3 x/day if needed for anxiety (Patient taking differently: Take 0.5 mg by mouth at bedtime. ) 270 tablet 0  . Carboxymethylcellulose Sodium (REFRESH TEARS OP) Apply 1 drop to eye daily as needed (dry eyes).    . Cholecalciferol (VITAMIN D-3) 5000 UNITS TABS Take 5,000 Units by mouth daily.     . finasteride (PROSCAR) 5 MG tablet Take 5 mg by mouth daily.    . Flaxseed, Linseed, (FLAX SEED OIL PO) Take 1 capsule by mouth daily.    Marland Kitchen ibuprofen (ADVIL,MOTRIN) 200 MG tablet Take 600 mg by mouth daily as needed for headache or moderate pain.    Marland Kitchen losartan (COZAAR) 50 MG tablet Take 1 tablet (50 mg total) by mouth daily. 90 tablet 3  . Multiple Vitamin (MULTIVITAMIN) tablet Take 1 tablet by mouth daily.    . Omega-3 Fatty Acids (FISH OIL PO) Take 1 capsule by mouth 2 (two) times daily.    Marland Kitchen RAPAFLO 8 MG CAPS capsule Take 8 mg by mouth as needed (urinary rentention).     . rivaroxaban (XARELTO) 20 MG TABS tablet Take 1 tablet (20 mg total) by mouth daily with supper. (Patient taking differently: Take 20 mg by mouth every evening. 8pm) 90 tablet 2  . rosuvastatin (CRESTOR) 40 MG tablet Take 1 tablet (40 mg total) by mouth daily. (Patient taking differently: Take 20 mg by mouth daily. ) 90 tablet 4  . tadalafil (CIALIS) 20 MG tablet Take 1/2 to 1 tablet daily or as directed (Patient taking differently: Take 7 mg by mouth daily. ) 90 tablet 2  . verapamil (CALAN) 120 MG tablet Take 0.5 tablets (60 mg total) by mouth at bedtime. 45 tablet 2  . vitamin C (ASCORBIC  ACID) 500 MG tablet Take 500 mg by mouth daily.     No current facility-administered medications on file prior to visit.     Current Problems (verified) Patient Active Problem List   Diagnosis Date Noted  . History of SCC (squamous cell carcinoma) of skin 10/04/2016  . Atrial fibrillation ([place]) 08/01/2016  . BMI 33.0-33.9,adult 05/21/2015  . Medication management 09/30/2013  . Obesity 09/30/2013  . Hypertension   . Hyperlipidemia   . Benign prostatic hyperplasia   . Other abnormal glucose   . IBS (irritable bowel syndrome)   . Vitamin D deficiency     Screening Tests Immunization History  Administered Date(s) Administered  . DTaP 07/11/2005  . Influenza Whole 04/02/2013  . Influenza, High Dose Seasonal PF 05/01/2014, 05/21/2015, 03/29/2016  . Pneumococcal Conjugate-13 05/01/2014  . Pneumococcal Polysaccharide-23 09/08/2008, 03/29/2016  . Td 12/16/2015  . Zoster 12/09/2008   Preventative care: Last colonoscopy: 2008 Dr. Deatra Ina due  2018 Stress test: 2014 Echo 08/2016 CXR 07/2016  Prior vaccinations: TD or Tdap: 2017 Influenza: 2017 Pneumococcal: 2010 Prevnar 13: 2015 Shingles/Zostavax: 2010  History reviewed: allergies, current medications, past family history, past medical history, past social history, past surgical history and problem list  Allergies Allergies  Allergen Reactions  . Clonazepam Other (See Comments)    "mean"  . Flomax [Tamsulosin Hcl] Other (See Comments)    hypotension  . Lipitor [Atorvastatin] Other (See Comments)    Myalgias    SURGICAL HISTORY He  has a past surgical history that includes Appendectomy (02-07-2007); Anterior cervical decomp/discectomy fusion (01-25-2002); LEFT INGUINAL HERNIA REPAIR (1970); Umbilical hernia repair (2003); LEFT ROTATOR CUFF REPAIR (2002); Anterior cruciate ligament repair (1990); Cervical fusion (2000); LEFT ANKLE SURG. (1980); Hip surgery (1999); Colonoscopy (05-30-2011); Tonsillectomy; Trigger finger  release (Right, 07/17/2013); Tendon reconstruction (Left, 07/17/2013); and Loop Recorder Insertion (N/A, 09/19/2016). FAMILY HISTORY His family history includes Diabetes in his father; Heart disease in his father and mother; Stroke in his mother. SOCIAL HISTORY He  reports that he has never smoked. He has never used smokeless tobacco. He reports that he drinks about 2.4 oz of alcohol per week . He reports that he does not use drugs.  MEDICARE WELLNESS OBJECTIVES: Physical activity: Current Exercise Habits: The patient does not participate in regular exercise at present (doing PT) Cardiac risk factors: Cardiac Risk Factors include: advanced age (>63men, >34 women);diabetes mellitus;family history of premature cardiovascular disease;hypertension;male gender;obesity (BMI >30kg/m2);sedentary lifestyle Depression/mood screen:   Depression screen [facility] 2/9 10/04/2016  Decreased Interest 0  Down, Depressed, Hopeless 0  PHQ - 2 Score 0    ADLs:  In your present state of health, do you have any difficulty performing the following activities: 10/04/2016 06/30/2016  Hearing? N N  Vision? N N  Difficulty concentrating or making decisions? N N  Walking or climbing stairs? N N  Dressing or bathing? N N  Doing errands, shopping? N N  Some recent data might be hidden     Cognitive Testing  Alert? Yes  Normal Appearance?Yes  Oriented to person? Yes  Place? Yes   Time? Yes  Recall of three objects?  Yes  Can perform simple calculations? Yes  Displays appropriate judgment?Yes  Can read the correct time from a watch face?Yes  EOL planning: Does Patient Have a Medical Advance Directive?: Yes Type of Advance Directive: Healthcare Power of Attorney, Living will [place] in Chart?: No - copy requested   Objective:   Blood pressure 120/88, pulse 66, temperature 97.7 F (36.5 C), resp. rate 14, height 6' (1.829 m), weight 236 lb 12.8 oz (107.4 kg), SpO2 97 %. Body mass index is  32.12 kg/m.  General appearance: alert, no distress, WD/WN, male HEENT: normocephalic, sclerae anicteric, TMs pearly, nares patent, no discharge or erythema, pharynx normal Oral cavity: MMM, no lesions Neck: supple, no lymphadenopathy, no thyromegaly, no masses Heart: RRR, normal S1, S2, no murmurs Lungs: CTA bilaterally, no wheezes, rhonchi, or rales Abdomen: +bs, soft, non tender, non distended, no masses, no hepatomegaly, no splenomegaly Musculoskeletal: nontender, no swelling, no obvious deformity Extremities: no edema, no cyanosis, no clubbing Pulses: 2+ symmetric, upper and lower extremities, normal cap refill Neurological: alert, oriented x 3, CN2-12 intact, strength normal upper extremities and lower extremities, sensation normal throughout, DTRs 2+ throughout, no cerebellar signs, gait normal Psychiatric: normal affect, behavior normal, pleasant   Medicare Attestation I have personally reviewed: The patient's medical and social history Their use of  alcohol, tobacco or illicit drugs Their current medications and supplements The patient's functional ability including ADLs,fall risks, home safety risks, cognitive, and hearing and visual impairment Diet and physical activities Evidence for depression or mood disorders  The patient's weight, height, BMI, and visual acuity have been recorded in the chart.  I have made referrals, counseling, and provided education to the patient based on review of the above and I have provided the patient with a written personalized care plan for preventive services.     Vicie Mutters, PA-C   10/04/2016

## 2016-10-05 ENCOUNTER — Other Ambulatory Visit: Payer: Self-pay | Admitting: *Deleted

## 2016-10-05 LAB — MAGNESIUM: Magnesium: 2 mg/dL (ref 1.5–2.5)

## 2016-10-05 MED ORDER — VERAPAMIL HCL 120 MG PO TABS: ORAL_TABLET | ORAL | 1 refills | Status: DC

## 2016-10-06 ENCOUNTER — Other Ambulatory Visit: Payer: Self-pay | Admitting: *Deleted

## 2016-10-06 MED ORDER — VERAPAMIL HCL ER 120 MG PO TBCR: 120.0000 mg | EXTENDED_RELEASE_TABLET | Freq: Every day | ORAL | 1 refills | Status: DC

## 2016-10-17 ENCOUNTER — Telehealth: Payer: Self-pay | Admitting: Pharmacist

## 2016-10-17 NOTE — Telephone Encounter (Signed)
Fax from [facility] for anticoag clearance for spinal injection. Pt on Xarelto for Afib with CHADS <4 and no history of stroke. Per protocol ok to hold 72 hour prior to spinal injection.

## 2016-10-18 ENCOUNTER — Ambulatory Visit: Payer: Medicare Other | Admitting: Podiatry

## 2016-10-18 ENCOUNTER — Telehealth: Payer: Self-pay | Admitting: Internal Medicine

## 2016-10-18 DIAGNOSIS — M7521 Bicipital tendinitis, right shoulder: Secondary | ICD-10-CM | POA: Diagnosis not present

## 2016-10-18 DIAGNOSIS — M25511 Pain in right shoulder: Secondary | ICD-10-CM | POA: Diagnosis not present

## 2016-10-18 DIAGNOSIS — S46011D Strain of muscle(s) and tendon(s) of the rotator cuff of right shoulder, subsequent encounter: Secondary | ICD-10-CM | POA: Diagnosis not present

## 2016-10-18 NOTE — Telephone Encounter (Signed)
Informed patient that transmissions are being received. Patient voiced understanding.

## 2016-10-18 NOTE — Telephone Encounter (Signed)
Mr. Charlot is calling to find out if his transmissions from the monitor are being received . Please call

## 2016-10-18 NOTE — Telephone Encounter (Signed)
Will route this message to device clinic for further review and follow-up with the pt.

## 2016-10-19 ENCOUNTER — Ambulatory Visit (INDEPENDENT_AMBULATORY_CARE_PROVIDER_SITE_OTHER): Payer: Medicare Other | Admitting: *Deleted

## 2016-10-19 DIAGNOSIS — I480 Paroxysmal atrial fibrillation: Secondary | ICD-10-CM

## 2016-10-19 DIAGNOSIS — L821 Other seborrheic keratosis: Secondary | ICD-10-CM | POA: Diagnosis not present

## 2016-10-19 DIAGNOSIS — Z85828 Personal history of other malignant neoplasm of skin: Secondary | ICD-10-CM | POA: Diagnosis not present

## 2016-10-19 DIAGNOSIS — L570 Actinic keratosis: Secondary | ICD-10-CM | POA: Diagnosis not present

## 2016-10-19 DIAGNOSIS — D1801 Hemangioma of skin and subcutaneous tissue: Secondary | ICD-10-CM | POA: Diagnosis not present

## 2016-10-19 DIAGNOSIS — L814 Other melanin hyperpigmentation: Secondary | ICD-10-CM | POA: Diagnosis not present

## 2016-10-20 NOTE — Progress Notes (Signed)
[facility] Summary Report / Loop Recorder 

## 2016-10-24 ENCOUNTER — Telehealth: Payer: Self-pay | Admitting: Internal Medicine

## 2016-10-24 NOTE — Telephone Encounter (Signed)
patient called to ask if it isokay for him to take diclofenac gel 1%. Per Vicie Mutters, okay. Advised patient, he understands.

## 2016-10-28 DIAGNOSIS — M47816 Spondylosis without myelopathy or radiculopathy, lumbar region: Secondary | ICD-10-CM | POA: Diagnosis not present

## 2016-10-28 LAB — CUP PACEART REMOTE DEVICE CHECK
Date Time Interrogation Session: 20180411144744
Implantable Pulse Generator Implant Date: 20180312

## 2016-10-31 ENCOUNTER — Encounter: Payer: Medicare Other | Admitting: Internal Medicine

## 2016-11-14 ENCOUNTER — Encounter: Payer: Self-pay | Admitting: *Deleted

## 2016-11-14 ENCOUNTER — Encounter: Payer: Self-pay | Admitting: Physician Assistant

## 2016-11-18 ENCOUNTER — Ambulatory Visit (INDEPENDENT_AMBULATORY_CARE_PROVIDER_SITE_OTHER): Payer: Medicare Other | Admitting: *Deleted

## 2016-11-18 DIAGNOSIS — I480 Paroxysmal atrial fibrillation: Secondary | ICD-10-CM

## 2016-11-21 NOTE — Progress Notes (Signed)
[facility] Summary Report / Loop Recorder 

## 2016-11-24 LAB — CUP PACEART REMOTE DEVICE CHECK
Date Time Interrogation Session: 20180511153550
MDC IDC PG IMPLANT DT: 20180312

## 2016-11-30 ENCOUNTER — Ambulatory Visit (INDEPENDENT_AMBULATORY_CARE_PROVIDER_SITE_OTHER): Payer: Medicare Other | Admitting: Internal Medicine

## 2016-11-30 ENCOUNTER — Encounter: Payer: Self-pay | Admitting: Internal Medicine

## 2016-11-30 VITALS — BP 114/68 | HR 81 | Ht 72.0 in | Wt 238.8 lb

## 2016-11-30 DIAGNOSIS — I10 Essential (primary) hypertension: Secondary | ICD-10-CM

## 2016-11-30 DIAGNOSIS — I480 Paroxysmal atrial fibrillation: Secondary | ICD-10-CM

## 2016-11-30 DIAGNOSIS — R0602 Shortness of breath: Secondary | ICD-10-CM

## 2016-11-30 DIAGNOSIS — R0789 Other chest pain: Secondary | ICD-10-CM | POA: Diagnosis not present

## 2016-11-30 LAB — CUP PACEART INCLINIC DEVICE CHECK
Date Time Interrogation Session: 20180523134054
Implantable Pulse Generator Implant Date: 20180312

## 2016-11-30 NOTE — Patient Instructions (Signed)
Medication Instructions:  Your physician recommends that you continue on your current medications as directed. Please refer to the Current Medication list given to you today.   Labwork: None ordered   Testing/Procedures: Your physician has requested that you have a lexiscan myoview. For further information please visit [email or web address]. Please follow instruction sheet, as given.    Follow-Up:  Your physician recommends that you schedule a follow-up appointment in: 3 months with Dr Rayann Heman   Any Other Special Instructions Will Be Listed Below (If Applicable).     If you need a refill on your cardiac medications before your next appointment, please call your pharmacy.

## 2016-11-30 NOTE — Progress Notes (Signed)
PCP: Unk Pinto, MD  [name] is a 71 y.o. male who presents today for routine electrophysiology followup.  Since last being seen in our clinic, the patient reports doing very well.  His primary concern is with bilateral knee pain.  This limits activity.  He has noticed SOB with moderate activity such as climbing steps.  He also has occasional L chest wall pain.  He denies exertional chest pain. Today, he denies symptoms of palpitations, lower extremity edema, dizziness, presyncope, or syncope.  The patient is otherwise without complaint today.   Past Medical History:  Diagnosis Date  . Arthritis   . BPH (benign prostatic hypertrophy)   . Frequency of urination   . Hyperlipidemia   . Hypertension   . IBS (irritable bowel syndrome)   . Mild acid reflux occasional  . Nocturia   . Obesity   . Pelvic pain in male   . Pre-diabetes   . Urgency of urination   . Vitamin D deficiency   . Wears glasses    Past Surgical History:  Procedure Laterality Date  . ANTERIOR CERVICAL DECOMP/DISCECTOMY FUSION  01-25-2002   C4 - C5  . ANTERIOR CRUCIATE LIGAMENT REPAIR  1990   RIGHT  . APPENDECTOMY  02-07-2007  . CERVICAL FUSION  2000   C5 - 6  . COLONOSCOPY  05-30-2011   HEMORRHOIDS  . HIP SURGERY  1999   LEFT HIP --  REMOVAL CALCIUM BUILD-UP  . LEFT ANKLE SURG.  1980  . LEFT INGUINAL HERNIA REPAIR  1970  . LEFT ROTATOR CUFF REPAIR  2002  . LOOP RECORDER INSERTION N/A 09/19/2016   Procedure: Loop Recorder Insertion;  Surgeon: Thompson Grayer, MD;  Location: [facility];  Service: Cardiovascular;  Laterality: N/A;  . TENDON RECONSTRUCTION Left 07/17/2013   Procedure: LEFT ELBOW LATERAL RECONSTRUCTION;  Surgeon: Schuyler Amor, MD;  Location: [facility];  Service: Orthopedics;  Laterality: Left;  . TONSILLECTOMY    . TRIGGER FINGER RELEASE Right 07/17/2013   Procedure: RELEASE A-1 PULLEY RIGHT RING FINGER;  Surgeon: Schuyler Amor, MD;  Location: [facility];  Service: Orthopedics;  Laterality: Right;  . UMBILICAL HERNIA REPAIR  2003    ROS- all systems are reviewed and negatives except as per HPI above  Current Outpatient Prescriptions  Medication Sig Dispense Refill  . ALPRAZolam (XANAX) 1 MG tablet Taken 1/2  to 1 tablet by mouth 3 times a day if needed for anxiety    . Carboxymethylcellulose Sodium (REFRESH TEARS OP) Apply 1 drop to eye daily as needed (dry eyes).    . Cholecalciferol (VITAMIN D-3) 5000 UNITS TABS Take 5,000 Units by mouth daily.     . finasteride (PROSCAR) 5 MG tablet Take 5 mg by mouth daily.    . Flaxseed, Linseed, (FLAX SEED OIL PO) Take 1 capsule by mouth daily.    Marland Kitchen ibuprofen (ADVIL,MOTRIN) 200 MG tablet Take 600 mg by mouth daily as needed for headache or moderate pain.    Marland Kitchen losartan (COZAAR) 50 MG tablet Take 1 tablet (50 mg total) by mouth daily. 90 tablet 3  . Multiple Vitamin (MULTIVITAMIN) tablet Take 1 tablet by mouth daily.    . Omega-3 Fatty Acids (FISH OIL PO) Take 1 capsule by mouth 2 (two) times daily.    Marland Kitchen RAPAFLO 8 MG CAPS capsule Take 8 mg by mouth as needed (Take as directed for urinary rentention).     . rivaroxaban (XARELTO) 20 MG TABS  tablet Take 1 tablet (20 mg total) by mouth daily with supper. 90 tablet 2  . rosuvastatin (CRESTOR) 40 MG tablet Take 1 tablet (40 mg total) by mouth daily. 90 tablet 4  . tadalafil (CIALIS) 20 MG tablet Take 1/2 to 1 tablet by mouth daily or as directed    . verapamil (CALAN-SR) 120 MG CR tablet Take 60 mg by mouth at bedtime.    . vitamin C (ASCORBIC ACID) 500 MG tablet Take 500 mg by mouth daily.     No current facility-administered medications for this visit.     Physical Exam: Vitals:   11/30/16 1205  BP: 114/68  Pulse: 81  SpO2: 95%  Weight: 238 lb 12.8 oz (108.3 kg)  Height: 6' (1.829 m)    GEN- The patient is well appearing, alert and oriented x 3 today.   Head- normocephalic, atraumatic Eyes-  Sclera clear, conjunctiva pink Ears-  hearing intact Oropharynx- clear Lungs- Clear to ausculation bilaterally, normal work of breathing Heart- Regular rate and rhythm, no murmurs, rubs or gallops, PMI not laterally displaced GI- soft, NT, ND, + BS Extremities- no clubbing, cyanosis, or edema  ILR is interrogated and reviewed at length today (see paceart)  Assessment and Plan:  1. Paroxysmal atrial fibrillation A single episode of afib since ILR implant (0.3%).  Episode occurred 11/19/16 lasting 4 hours 44 minutes and occurring at 2am.  Asymptomatic. Continue xarelto for chads2vasc score of 2.  If his AF burden remains low, he may decide to stop anticoagulation long term.  2. HTN Stable No change required today  3. SOB/ chest pain New problems Both typical and atypical features.  He is concerned because a friend recently had a MI. Will proceed with lexiscan myoview (cannot walk on treadmill due to knees).  If low risk, no further testing.  If high risk, additional testing may be required.  carelink Return to see me in 3 months  Thompson Grayer MD, [facility] 11/30/2016 12:48 PM

## 2016-12-12 ENCOUNTER — Telehealth (HOSPITAL_COMMUNITY): Payer: Self-pay | Admitting: *Deleted

## 2016-12-12 NOTE — Telephone Encounter (Signed)
Patient given detailed instructions per Myocardial Perfusion Study Information Sheet for the test on 12/14/16 at 0745. Patient notified to arrive 15 minutes early and that it is imperative to arrive on time for appointment to keep from having the test rescheduled.  If you need to cancel or reschedule your appointment, please call the office within 24 hours of your appointment. . Patient verbalized understanding.[name], [name]

## 2016-12-14 ENCOUNTER — Ambulatory Visit (HOSPITAL_COMMUNITY): Payer: Medicare Other | Attending: Cardiology

## 2016-12-14 DIAGNOSIS — R0789 Other chest pain: Secondary | ICD-10-CM | POA: Insufficient documentation

## 2016-12-14 DIAGNOSIS — R0602 Shortness of breath: Secondary | ICD-10-CM | POA: Diagnosis not present

## 2016-12-14 LAB — MYOCARDIAL PERFUSION IMAGING
CSEPPHR: 72 {beats}/min
LHR: 0.26
LVDIAVOL: 129 mL (ref 62–150)
LVSYSVOL: 59 mL
Rest HR: 62 {beats}/min
SDS: 0
SRS: 2
SSS: 2
TID: 0.98

## 2016-12-14 MED ORDER — TECHNETIUM TC 99M TETROFOSMIN IV KIT
10.8000 | PACK | Freq: Once | INTRAVENOUS | Status: AC | PRN
  Administered 2016-12-14: 10.8 via INTRAVENOUS
  Filled 2016-12-14: qty 11

## 2016-12-14 MED ORDER — REGADENOSON 0.4 MG/5ML IV SOLN
0.4000 mg | Freq: Once | INTRAVENOUS | Status: AC
  Administered 2016-12-14: 0.4 mg via INTRAVENOUS

## 2016-12-14 MED ORDER — TECHNETIUM TC 99M TETROFOSMIN IV KIT
30.7000 | PACK | Freq: Once | INTRAVENOUS | Status: AC | PRN
  Administered 2016-12-14: 30.7 via INTRAVENOUS
  Filled 2016-12-14: qty 31

## 2016-12-19 ENCOUNTER — Ambulatory Visit (INDEPENDENT_AMBULATORY_CARE_PROVIDER_SITE_OTHER): Payer: Medicare Other | Admitting: *Deleted

## 2016-12-19 ENCOUNTER — Telehealth: Payer: Self-pay | Admitting: Internal Medicine

## 2016-12-19 DIAGNOSIS — I480 Paroxysmal atrial fibrillation: Secondary | ICD-10-CM

## 2016-12-19 NOTE — Progress Notes (Signed)
[facility] Summary Report / Loop Recorder 

## 2016-12-19 NOTE — Telephone Encounter (Signed)
New message      Did you send his remote transmission

## 2016-12-19 NOTE — Telephone Encounter (Signed)
Spoke with pt and informed him that his transmission was received.

## 2016-12-19 NOTE — Telephone Encounter (Signed)
New message      Pt c/o medication issue:  1. Name of Medication: Xarelto   2. How are you currently taking this medication (dosage and times per day)? As prescribed  3. Are you having a reaction (difficulty breathing--STAT)? no  4. What is your medication issue? Cant walk up and down steps , cant get up and get out of chair, severe muscle pain and weakness

## 2016-12-19 NOTE — Telephone Encounter (Signed)
Discussed patient's symptoms(started 5 weeks ago) with Dr. Rayann Heman and he says he does not think it's his Xarelto but patient says he has appointment with PA tomorrow to discuss further.  His transmission today looked good, no afib since May, and we knew about this one.  He is very frustrated and just wants to be able to move.  He denies SOB, just says he can't move.  I suggested it could be the Crestor as he has had these same issues with Lipitor in the past.  He gets his Crestor from [place].  I suggested he not take his Crestor tonight.  He can keep his appointment with PA tomorrow but the symptoms he's reporting sound like that of a statin.  He is having muscle and joint pain, feels like he cannot move.  He has to have help going up stairs, getting off commode, etc.  He is going to bring with him tomorrow the information he printed off the internet in regards to Xarelto to show Dr Rayann Heman and PA.  Dr Rayann Heman felt he should see PCP and he is willing to see if needed.

## 2016-12-20 ENCOUNTER — Ambulatory Visit (INDEPENDENT_AMBULATORY_CARE_PROVIDER_SITE_OTHER): Payer: Medicare Other | Admitting: Cardiology

## 2016-12-20 ENCOUNTER — Encounter: Payer: Self-pay | Admitting: Cardiology

## 2016-12-20 VITALS — BP 124/80 | HR 76 | Ht 72.0 in | Wt 237.0 lb

## 2016-12-20 DIAGNOSIS — M791 Myalgia, unspecified site: Secondary | ICD-10-CM

## 2016-12-20 DIAGNOSIS — I480 Paroxysmal atrial fibrillation: Secondary | ICD-10-CM | POA: Diagnosis not present

## 2016-12-20 DIAGNOSIS — R0789 Other chest pain: Secondary | ICD-10-CM | POA: Diagnosis not present

## 2016-12-20 DIAGNOSIS — I10 Essential (primary) hypertension: Secondary | ICD-10-CM | POA: Diagnosis not present

## 2016-12-20 MED ORDER — APIXABAN 5 MG PO TABS: 5.0000 mg | ORAL_TABLET | Freq: Two times a day (BID) | ORAL | Status: DC

## 2016-12-20 NOTE — Patient Instructions (Addendum)
Medication Instructions:   STOP TAKING  XARELTO   STOP TAKING CRESTOR DUE  TO PAIN  UNTIL FURTHER DIRECTIONS  START TAKING ELIQUIS 5 MG TWICE A DAY    If you need a refill on your cardiac medications before your next appointment, please call your pharmacy.  Labwork: NONE ORDERED  TODAY    Testing/Procedures: NONE ORDERED  TODAY    Follow-Up: WITH LAURA INGOLD IN  3 WEEKS :  PER LAURA   Any Other Special Instructions Will Be Listed Below (If Applicable).

## 2016-12-20 NOTE — Progress Notes (Signed)
Cardiology Office Note   Date:  12/20/2016   ID:  RAYSEAN GRAUMANN, DOB 1945-11-09, MRN [id number]  PCP:  Unk Pinto, MD  Cardiologist:  Dr. Rayann Heman    Chief Complaint  Patient presents with  . Muscle Pain      History of Present Illness: JONATHYN CAROTHERS is a 71 y.o. male who presents for severe muscle pain.  Hx of PAF on Xarelto for chads2vasc score of 2. Hx HTN,   Pt seen by Dr. Rayann Heman recently and complained of chest pain. Had nuc study EF 54% low risk study.  No ischemia,  Today pt complains of mucuslar pain all over that began 6-8 weeks ago.  Pt attributes this to Xarelto - he does not believe the crestor is causing.  Nevertheless he has stopped Crestor today.  Pt can hardly walk up the stairs. The pain is getting worse and now he has injured his Lt knee.   Pt brought in letters from Internet that also complain of muscular pain on xarelto.   In UpTO DATE 1-3% of muscular pain with xarelto.    He would like to switch to another NOAC -     Past Medical History:  Diagnosis Date  . Arthritis   . BPH (benign prostatic hypertrophy)   . Frequency of urination   . Hyperlipidemia   . Hypertension   . IBS (irritable bowel syndrome)   . Mild acid reflux occasional  . Nocturia   . Obesity   . Pelvic pain in male   . Pre-diabetes   . Urgency of urination   . Vitamin D deficiency   . Wears glasses     Past Surgical History:  Procedure Laterality Date  . ANTERIOR CERVICAL DECOMP/DISCECTOMY FUSION  01-25-2002   C4 - C5  . ANTERIOR CRUCIATE LIGAMENT REPAIR  1990   RIGHT  . APPENDECTOMY  02-07-2007  . CERVICAL FUSION  2000   C5 - 6  . COLONOSCOPY  05-30-2011   HEMORRHOIDS  . HIP SURGERY  1999   LEFT HIP --  REMOVAL CALCIUM BUILD-UP  . LEFT ANKLE SURG.  1980  . LEFT INGUINAL HERNIA REPAIR  1970  . LEFT ROTATOR CUFF REPAIR  2002  . LOOP RECORDER INSERTION N/A 09/19/2016   Procedure: Loop Recorder Insertion;  Surgeon: Thompson Grayer, MD;  Location: [facility];  Service: Cardiovascular;  Laterality: N/A;  . TENDON RECONSTRUCTION Left 07/17/2013   Procedure: LEFT ELBOW LATERAL RECONSTRUCTION;  Surgeon: Schuyler Amor, MD;  Location: [facility];  Service: Orthopedics;  Laterality: Left;  . TONSILLECTOMY    . TRIGGER FINGER RELEASE Right 07/17/2013   Procedure: RELEASE A-1 PULLEY RIGHT RING FINGER;  Surgeon: Schuyler Amor, MD;  Location: [facility];  Service: Orthopedics;  Laterality: Right;  . UMBILICAL HERNIA REPAIR  2003     Current Outpatient Prescriptions  Medication Sig Dispense Refill  . ALPRAZolam (XANAX) 1 MG tablet Taken 1/2  to 1 tablet by mouth 3 times a day if needed for anxiety    . Carboxymethylcellulose Sodium (REFRESH TEARS OP) Apply 1 drop to eye daily as needed (dry eyes).    . Cholecalciferol (VITAMIN D-3) 5000 UNITS TABS Take 5,000 Units by mouth daily.     . finasteride (PROSCAR) 5 MG tablet Take 5 mg by mouth daily.    . Flaxseed, Linseed, (FLAX SEED OIL PO) Take 1 capsule by mouth daily.    Marland Kitchen ibuprofen (ADVIL,MOTRIN) 200 MG tablet Take  600 mg by mouth daily as needed for headache or moderate pain.    Marland Kitchen losartan (COZAAR) 50 MG tablet Take 1 tablet (50 mg total) by mouth daily. 90 tablet 3  . Multiple Vitamin (MULTIVITAMIN) tablet Take 1 tablet by mouth daily.    . Omega-3 Fatty Acids (FISH OIL PO) Take 1 capsule by mouth 2 (two) times daily.    Marland Kitchen RAPAFLO 8 MG CAPS capsule Take 8 mg by mouth as needed (Take as directed for urinary rentention).     . rivaroxaban (XARELTO) 20 MG TABS tablet Take 1 tablet (20 mg total) by mouth daily with supper. 90 tablet 2  . tadalafil (CIALIS) 20 MG tablet Take 1/2 to 1 tablet by mouth daily or as directed    . verapamil (CALAN-SR) 120 MG CR tablet Take 60 mg by mouth at bedtime.    . vitamin C (ASCORBIC ACID) 500 MG tablet Take 500 mg by mouth daily.     No current facility-administered medications for this visit.     Allergies:   Clonazepam; Flomax  [tamsulosin hcl]; and Lipitor [atorvastatin]    Social History:  The patient  reports that he has never smoked. He has never used smokeless tobacco. He reports that he drinks about 2.4 oz of alcohol per week . He reports that he does not use drugs.   Family History:  The patient's family history includes Diabetes in his father; Heart disease in his father and mother; Stroke in his mother.    ROS:  General:no colds or fevers, no weight changes Skin:no rashes or ulcers HEENT:no blurred vision, no congestion CV:see HPI PUL:see HPI GI:no diarrhea constipation or melena, no indigestion GU:no hematuria, no dysuria MS:+ Lt knee pain, no claudication, muscular pain all over.  Neuro:no syncope, no lightheadedness Endo:no diabetes, no thyroid disease  Wt Readings from Last 3 Encounters:  12/20/16 237 lb (107.5 kg)  12/14/16 238 lb (108 kg)  11/30/16 238 lb 12.8 oz (108.3 kg)     PHYSICAL EXAM: VS:  BP 124/80   Pulse 76   Ht 6' (1.829 m)   Wt 237 lb (107.5 kg)   SpO2 96%   BMI 32.14 kg/m  , BMI Body mass index is 32.14 kg/m. General:Pleasant affect, NAD Skin:Warm and dry, brisk capillary refill HEENT:normocephalic, sclera clear, mucus membranes moist Neck:supple, no JVD, no bruits  Heart:S1S2 RRR without murmur, gallup, rub or click Lungs:clear without rales, rhonchi, or wheezes GGY:IRSW, non tender, + BS, do not palpate liver spleen or masses Ext:no lower ext edema, 2+ pedal pulses, 2+ radial pulses, Lt knee bandaged.  Neuro:alert and oriented, MAE, follows commands, + facial symmetry    EKG:  EKG is NOT ordered today.   Recent Labs: 10/04/2016: ALT 24; BUN 17; Creat 0.84; Hemoglobin 15.8; Magnesium 2.0; Platelets 233; Potassium 4.0; Sodium 140; TSH 1.03    Lipid Panel    Component Value Date/Time   CHOL 136 10/04/2016 1456   TRIG 82 10/04/2016 1456   HDL 50 10/04/2016 1456   CHOLHDL 2.7 10/04/2016 1456   VLDL 16 10/04/2016 1456   LDLCALC 70 10/04/2016 1456        Other studies Reviewed: Additional studies/ records that were reviewed today include: . NUC study 12/14/16  Study Highlights     Nuclear stress EF: 54%.  There was no ST segment deviation noted during stress.  This is a low risk study.  The left ventricular ejection fraction is mildly decreased (45-54%).   1. EF 54%, normal  wall motion.  2. Fixed medium-sized, mild basal to mid inferolateral and inferior perfusion defect.  Given normal inferior wall motion, think most likely diaphragmatic attenuation.  No ischemia.   Low risk study.    Echo: Study Conclusions  - Left ventricle: The cavity size was normal. Wall thickness was   normal. Systolic function was normal. The estimated ejection   fraction was in the range of 55% to 60%. Wall motion was normal;   there were no regional wall motion abnormalities. Doppler   parameters are consistent with abnormal left ventricular   relaxation (grade 1 diastolic dysfunction). The E/e&' ratio is <8,   suggesting normal LV filling pressure. - Aortic valve: Sclerosis without stenosis. There was no   regurgitation. - Mitral valve: Mildly thickened leaflets . There was trivial   regurgitation. - Left atrium: The atrium was normal in size. - Right atrium: The atrium was normal in size. - Inferior vena cava: The vessel was normal in size. The   respirophasic diameter changes were in the normal range (>= 50%),   consistent with normal central venous pressure.  Impressions:  - LVEF 55-60%, normal wall thickness, normal wall motion, diastolic   dysfunction, normal LV filling pressure, aortic valve sclerosis,   trivial MR, normal LA size, normal IVC.   ASSESSMENT AND PLAN:  1.  Muscular pain.  Pain all over, can hardly ambulate more with climbing stairs,  He does have joint pain in addition to the muscular pain.  He brought in notes from Internet about muscular pain with xarelto.  We have asked him to stop crestor and xarelto and  begin eliquis 5 mg BID tomorrow.  We will give samples.  I will see him back in 3 weeks though if symptoms gone he does not have to keep.  Once pain has improved/resolved we will resume crestor to see if it is causing any pain.   Up to date has 1-3% chance of muscular pain.    --he will keep follow up with Dr. Rayann Heman.   2. PAF  With Linq with plan to possible stop anticoagulant in future.    3.  HTN stable  4. Recent chest pain with neg nuc study.    5. Lt knee pain with pop a few days ago to see Ortho.    Current medicines are reviewed with the patient today.  The patient Has no concerns regarding medicines.  The following changes have been made:  See above Labs/ tests ordered today include:see above  Disposition:   FU:  see above  Signed, Cecilie Kicks, NP  12/20/2016 2:09 PM    [place], [address] [place], [place] Phone: [phone]; Fax: [phone]

## 2016-12-22 DIAGNOSIS — S46012A Strain of muscle(s) and tendon(s) of the rotator cuff of left shoulder, initial encounter: Secondary | ICD-10-CM | POA: Diagnosis not present

## 2016-12-22 DIAGNOSIS — M25562 Pain in left knee: Secondary | ICD-10-CM | POA: Diagnosis not present

## 2016-12-22 DIAGNOSIS — S83282A Other tear of lateral meniscus, current injury, left knee, initial encounter: Secondary | ICD-10-CM | POA: Diagnosis not present

## 2016-12-26 ENCOUNTER — Telehealth: Payer: Self-pay | Admitting: Internal Medicine

## 2016-12-26 ENCOUNTER — Encounter: Payer: Self-pay | Admitting: Physician Assistant

## 2016-12-26 NOTE — Telephone Encounter (Signed)
Patient called office to report that he was seen by Dr Elsie Saas on 12-22-16, dx gout, rxd colchicine 6mg  bid. Taking since Saturday. Patient said severe pain has improved.  Dr Archie Endo office called patient to advise that a counter interaction is possible between colchicine and verapamil. Advised to contact pcp for instructions on medicine management. Please advise. [facility].

## 2016-12-27 LAB — CUP PACEART REMOTE DEVICE CHECK
Date Time Interrogation Session: 20180610153642
Implantable Pulse Generator Implant Date: 20180312

## 2016-12-28 ENCOUNTER — Encounter: Payer: Self-pay | Admitting: Cardiology

## 2016-12-28 ENCOUNTER — Telehealth: Payer: Self-pay | Admitting: Cardiology

## 2016-12-28 MED ORDER — APIXABAN 5 MG PO TABS: 5.0000 mg | ORAL_TABLET | Freq: Two times a day (BID) | ORAL | 1 refills | Status: DC

## 2016-12-28 NOTE — Telephone Encounter (Signed)
New message      *STAT* If patient is at the pharmacy, call can be transferred to refill team.   1. Which medications need to be refilled? (please list name of each medication and dose if known) Eliquis 5 mg  2. Which pharmacy/location (including street and city if local pharmacy) is medication to be sent to? [facility]  3. Do they need a 30 day or 90 day supply? 90 day

## 2016-12-28 NOTE — Telephone Encounter (Signed)
Pt last saw Cecilie Kicks on 12/20/16 Xarelto switched to Eliquis 5mg  BID.  Pt's last Creat 0.84, weight 107.5kg, age 71, based on specified criteria pt is on appropriate dosage of Eliquis 5mg  BID.  90 day rx sent to [facility] mail order pharmacy per pt request.

## 2016-12-29 ENCOUNTER — Other Ambulatory Visit: Payer: Self-pay

## 2016-12-29 ENCOUNTER — Other Ambulatory Visit: Payer: Self-pay | Admitting: *Deleted

## 2016-12-29 MED ORDER — APIXABAN 5 MG PO TABS: 5.0000 mg | ORAL_TABLET | Freq: Two times a day (BID) | ORAL | 1 refills | Status: DC

## 2017-01-16 ENCOUNTER — Ambulatory Visit: Payer: Medicare Other | Admitting: Cardiology

## 2017-01-17 ENCOUNTER — Ambulatory Visit (INDEPENDENT_AMBULATORY_CARE_PROVIDER_SITE_OTHER): Payer: Medicare Other | Admitting: *Deleted

## 2017-01-17 DIAGNOSIS — I480 Paroxysmal atrial fibrillation: Secondary | ICD-10-CM | POA: Diagnosis not present

## 2017-01-18 NOTE — Progress Notes (Signed)
[facility] Summary Report / Loop Recorder 

## 2017-01-27 LAB — CUP PACEART REMOTE DEVICE CHECK
Date Time Interrogation Session: 20180710184732
MDC IDC PG IMPLANT DT: 20180312

## 2017-02-01 ENCOUNTER — Ambulatory Visit (INDEPENDENT_AMBULATORY_CARE_PROVIDER_SITE_OTHER): Payer: Medicare Other | Admitting: Internal Medicine

## 2017-02-01 ENCOUNTER — Encounter: Payer: Self-pay | Admitting: Internal Medicine

## 2017-02-01 VITALS — BP 116/72 | HR 68 | Temp 97.3°F | Resp 14 | Ht 72.0 in | Wt 233.4 lb

## 2017-02-01 DIAGNOSIS — Z1211 Encounter for screening for malignant neoplasm of colon: Secondary | ICD-10-CM | POA: Diagnosis not present

## 2017-02-01 DIAGNOSIS — Z125 Encounter for screening for malignant neoplasm of prostate: Secondary | ICD-10-CM

## 2017-02-01 DIAGNOSIS — R7303 Prediabetes: Secondary | ICD-10-CM

## 2017-02-01 DIAGNOSIS — I10 Essential (primary) hypertension: Secondary | ICD-10-CM | POA: Diagnosis not present

## 2017-02-01 DIAGNOSIS — E559 Vitamin D deficiency, unspecified: Secondary | ICD-10-CM | POA: Diagnosis not present

## 2017-02-01 DIAGNOSIS — Z136 Encounter for screening for cardiovascular disorders: Secondary | ICD-10-CM

## 2017-02-01 DIAGNOSIS — E782 Mixed hyperlipidemia: Secondary | ICD-10-CM | POA: Diagnosis not present

## 2017-02-01 DIAGNOSIS — R7309 Other abnormal glucose: Secondary | ICD-10-CM | POA: Diagnosis not present

## 2017-02-01 DIAGNOSIS — Z79899 Other long term (current) drug therapy: Secondary | ICD-10-CM | POA: Diagnosis not present

## 2017-02-01 DIAGNOSIS — M109 Gout, unspecified: Secondary | ICD-10-CM

## 2017-02-01 DIAGNOSIS — N401 Enlarged prostate with lower urinary tract symptoms: Secondary | ICD-10-CM

## 2017-02-01 DIAGNOSIS — I480 Paroxysmal atrial fibrillation: Secondary | ICD-10-CM

## 2017-02-01 DIAGNOSIS — Z1212 Encounter for screening for malignant neoplasm of rectum: Secondary | ICD-10-CM

## 2017-02-01 LAB — CBC WITH DIFFERENTIAL/PLATELET
BASOS PCT: 1 %
Basophils Absolute: 78 cells/uL (ref 0–200)
EOS ABS: 234 {cells}/uL (ref 15–500)
EOS PCT: 3 %
HCT: 47.6 % (ref 38.5–50.0)
Hemoglobin: 15.9 g/dL (ref 13.2–17.1)
Lymphocytes Relative: 19 %
Lymphs Abs: 1482 cells/uL (ref 850–3900)
MCH: 31.2 pg (ref 27.0–33.0)
MCHC: 33.4 g/dL (ref 32.0–36.0)
MCV: 93.5 fL (ref 80.0–100.0)
MONOS PCT: 8 %
MPV: 9.9 fL (ref 7.5–12.5)
Monocytes Absolute: 624 cells/uL (ref 200–950)
NEUTROS ABS: 5382 {cells}/uL (ref 1500–7800)
Neutrophils Relative %: 69 %
PLATELETS: 232 10*3/uL (ref 140–400)
RBC: 5.09 MIL/uL (ref 4.20–5.80)
RDW: 14 % (ref 11.0–15.0)
WBC: 7.8 10*3/uL (ref 3.8–10.8)

## 2017-02-01 NOTE — Progress Notes (Signed)
[facility]   Unk Pinto, M.D.      Uvaldo Bristle. Silverio Lay, P.A.-C [facility]                [address] [place], [place] [address] Telephone [phone] Telefax [phone]  Comprehensive Evaluation & Examination     This very nice 71 y.o. MWM  presents for a  comprehensive evaluation and management of multiple medical co-morbidities.  Patient has been followed for HTN,  Prediabetes, Hyperlipidemia and Vitamin D Deficiency.He also has BPH w/LUTS and occasionally takes Rapaflo of obstructive sx's.      HTN predates since  2007. Patient's BP has been controlled at home.  Today's BP is at goal -  116/72.  Patient was recently dx'd with pAfib attributed to Decongestants for an Upper/lower Respiratory infection. Currently he's carrying a Loop recorder since 09/19/16 per Dr Rayann Heman. Patient  started on Xarelto , then switched to Eliquis for suspect exacerbation of disabling myalgias attributed to the Xarelto.  Patient denies any cardiac symptoms as chest pain, palpitations, shortness of breath, dizziness or ankle swelling.     Patient also relates recent aspiration of a painful Lt Knee effusion showing "Gout crystals" and  Treated initially with Colchicine - then stopped for concern of interaction with Xarelto.      Patient's hyperlipidemia is controlled with diet and medications. Patient denies myalgias or other medication SE's. Last lipids were at goal: Lab Results  Component Value Date   CHOL 136 10/04/2016   HDL 50 10/04/2016   LDLCALC 70 10/04/2016   TRIG 82 10/04/2016   CHOLHDL 2.7 10/04/2016      Patient has prediabetes (A1c 5.7% in 2013) and patient denies reactive hypoglycemic symptoms, visual blurring, diabetic polys or paresthesias. Last A1c was at goal: Lab Results  Component Value Date   HGBA1C 5.2 06/30/2016       Finally, patient has history of Vitamin D Deficiency ("33" in 2008)  and last  vitamin D was  Lab Results  Component Value Date   VD25OH 79 06/30/2016   Current Outpatient Prescriptions on File Prior to Visit  Medication Sig  . ALPRAZolam (XANAX) 1 MG tablet Taken 1/2  to 1 tablet by mouth 3 times a day if needed for anxiety  . apixaban (ELIQUIS) 5 MG TABS tablet Take 1 tablet (5 mg total) by mouth 2 (two) times daily.  . Carboxymethylcellulose Sodium (REFRESH TEARS OP) Apply 1 drop to eye daily as needed (dry eyes).  . Cholecalciferol (VITAMIN D-3) 5000 UNITS TABS Take 5,000 Units by mouth daily.   . finasteride (PROSCAR) 5 MG tablet Take 5 mg by mouth daily.  . Flaxseed, Linseed, (FLAX SEED OIL PO) Take 1 capsule by mouth daily.  Marland Kitchen ibuprofen (ADVIL,MOTRIN) 200 MG tablet Take 600 mg by mouth daily as needed for headache or moderate pain.  Marland Kitchen losartan (COZAAR) 50 MG tablet Take 1 tablet (50 mg total) by mouth daily.  . Multiple Vitamin (MULTIVITAMIN) tablet Take 1 tablet by mouth daily.  . Omega-3 Fatty Acids (FISH OIL PO) Take 1 capsule by mouth 2 (two) times daily.  Marland Kitchen RAPAFLO 8 MG CAPS capsule Take 8 mg by mouth as needed (Take as directed for urinary rentention).   . tadalafil (CIALIS) 20 MG tablet Take 1/2 to 1 tablet by mouth daily or as directed  . verapamil (CALAN-SR) 120 MG CR tablet  Take 60 mg by mouth at bedtime.  . vitamin C (ASCORBIC ACID) 500 MG tablet Take 500 mg by mouth daily.   No current facility-administered medications on file prior to visit.    Allergies  Allergen Reactions  . Clonazepam Other (See Comments)    unknown  . Flomax [Tamsulosin Hcl] Other (See Comments)    hypotension  . Lipitor [Atorvastatin] Other (See Comments)    Myalgias   Past Medical History:  Diagnosis Date  . Arthritis   . BPH (benign prostatic hypertrophy)   . Frequency of urination   . Hyperlipidemia   . Hypertension   . IBS (irritable bowel syndrome)   . Mild acid reflux occasional  . Nocturia   . Obesity   . Pelvic pain in male   . Pre-diabetes   .  Urgency of urination   . Vitamin D deficiency   . Wears glasses    Health Maintenance  Topic Date Due  . Hepatitis C Screening  [birth year]  . COLONOSCOPY  10/17/2016  . INFLUENZA VACCINE  02/08/2017  . TETANUS/TDAP  12/15/2025  . PNA vac Low Risk Adult  Completed   Immunization History  Administered Date(s) Administered  . DTaP 07/11/2005  . Influenza Whole 04/02/2013  . Influenza, High Dose Seasonal PF 05/01/2014, 05/21/2015, 03/29/2016  . Pneumococcal Conjugate-13 05/01/2014  . Pneumococcal Polysaccharide-23 09/08/2008, 03/29/2016  . Td 12/16/2015  . Zoster 12/09/2008   Past Surgical History:  Procedure Laterality Date  . ANTERIOR CERVICAL DECOMP/DISCECTOMY FUSION  01-25-2002   C4 - C5  . ANTERIOR CRUCIATE LIGAMENT REPAIR  1990   RIGHT  . APPENDECTOMY  02-07-2007  . CERVICAL FUSION  2000   C5 - 6  . COLONOSCOPY  05-30-2011   HEMORRHOIDS  . HIP SURGERY  1999   LEFT HIP --  REMOVAL CALCIUM BUILD-UP  . LEFT ANKLE SURG.  1980  . LEFT INGUINAL HERNIA REPAIR  1970  . LEFT ROTATOR CUFF REPAIR  2002  . LOOP RECORDER INSERTION N/A 09/19/2016   Procedure: Loop Recorder Insertion;  Surgeon: Thompson Grayer, MD;  Location: [place] [facility];  Service: Cardiovascular;  Laterality: N/A;  . TENDON RECONSTRUCTION Left 07/17/2013   Procedure: LEFT ELBOW LATERAL RECONSTRUCTION;  Surgeon: Schuyler Amor, MD;  Location: [place];  Service: Orthopedics;  Laterality: Left;  . TONSILLECTOMY    . TRIGGER FINGER RELEASE Right 07/17/2013   Procedure: RELEASE A-1 PULLEY RIGHT RING FINGER;  Surgeon: Schuyler Amor, MD;  Location: [place];  Service: Orthopedics;  Laterality: Right;  . UMBILICAL HERNIA REPAIR  2003   Family History  Problem Relation Age of Onset  . Heart disease Mother   . Stroke Mother   . Heart disease Father   . Diabetes Father    Social History   Social History  . Marital status: Married    Spouse name: N/A  . Number of children:  1  . Years of education: N/A   Occupational History  . Business Consultant    Social History Main Topics  . Smoking status: Never Smoker  . Smokeless tobacco: Never Used  . Alcohol use 2.4 oz/week    4 Standard drinks or equivalent per week     Comment: OCCASIONAL  . Drug use: No  . Sexual activity: Not on file    ROS Constitutional: Denies fever, chills, weight loss/gain, headaches, insomnia,  night sweats or change in appetite. Does c/o fatigue. Eyes: Denies redness, blurred vision, diplopia, discharge, itchy or  watery eyes.  ENT: Denies discharge, congestion, post nasal drip, epistaxis, sore throat, earache, hearing loss, dental pain, Tinnitus, Vertigo, Sinus pain or snoring.  Cardio: Denies chest pain, palpitations, irregular heartbeat, syncope, dyspnea, diaphoresis, orthopnea, PND, claudication or edema Respiratory: denies cough, dyspnea, DOE, pleurisy, hoarseness, laryngitis or wheezing.  Gastrointestinal: Denies dysphagia, heartburn, reflux, water brash, pain, cramps, nausea, vomiting, bloating, diarrhea, constipation, hematemesis, melena, hematochezia, jaundice or hemorrhoids Genitourinary: Denies dysuria, frequency, urgency, nocturia, hesitancy, discharge, hematuria or flank pain Musculoskeletal: Denies arthralgia, myalgia, stiffness, Jt. Swelling, pain, limp or strain/sprain. Denies Falls. Skin: Denies puritis, rash, hives, warts, acne, eczema or change in skin lesion Neuro: No weakness, tremor, incoordination, spasms, paresthesia or pain Psychiatric: Denies confusion, memory loss or sensory loss. Denies Depression. Endocrine: Denies change in weight, skin, hair change, nocturia, and paresthesia, diabetic polys, visual blurring or hyper / hypo glycemic episodes.  Heme/Lymph: No excessive bleeding, bruising or enlarged lymph nodes.  Physical Exam  BP 116/72   Pulse 68   Temp (!) 97.3 F (36.3 C)   Resp 14   Ht 6' (1.829 m)   Wt 233 lb 6.4 oz (105.9 kg)   BMI 31.65  kg/m   General Appearance: Well nourished and well groomed and in no apparent distress.  Eyes: PERRLA, EOMs, conjunctiva no swelling or erythema, normal fundi and vessels. Sinuses: No frontal/maxillary tenderness ENT/Mouth: EACs patent / TMs  nl. Nares clear without erythema, swelling, mucoid exudates. Oral hygiene is good. No erythema, swelling, or exudate. Tongue normal, non-obstructing. Tonsils not swollen or erythematous. Hearing normal.  Neck: Supple, thyroid normal. No bruits, nodes or JVD. Respiratory: Respiratory effort normal.  BS equal and clear bilateral without rales, rhonci, wheezing or stridor. Cardio: Heart sounds are normal with regular rate and rhythm and no murmurs, rubs or gallops. Peripheral pulses are normal and equal bilaterally without edema. No aortic or femoral bruits. Chest: symmetric with normal excursions and percussion.  Abdomen: Soft, with Nl bowel sounds. Nontender, no guarding, rebound, hernias, masses, or organomegaly.  Lymphatics: Non tender without lymphadenopathy.  Genitourinary: No hernias.Testes nl. DRE - deferred to recent Urology DRE. Musculoskeletal: Full ROM all peripheral extremities, joint stability, 5/5 strength, and normal gait. Skin: Warm and dry without rashes, lesions, cyanosis, clubbing or  ecchymosis.  Neuro: Cranial nerves intact, reflexes equal bilaterally. Normal muscle tone, no cerebellar symptoms. Sensation intact.  Pysch: Alert and oriented X 3 with normal affect, insight and judgment appropriate.   Assessment and Plan  1. Essential hypertension  - [place], RETROPERITNL ABD,  LTD - Urinalysis, Routine w reflex microscopic - Microalbumin / creatinine urine ratio - CBC with Differential/Platelet - BASIC METABOLIC PANEL WITH GFR - Magnesium - TSH  2. Hyperlipidemia, mixed  - [place], RETROPERITNL ABD,  LTD - Hepatic function panel - Lipid panel - TSH  3. Pre-diabetes  - [place], RETROPERITNL ABD,  LTD - Hemoglobin A1c - Insulin,  random  4. Vitamin D deficiency  - VITAMIN D 25 Hydroxy   5. Other abnormal glucose  - Hemoglobin A1c - Insulin, random  6. Paroxysmal atrial fibrillation (HCC)  - [place], RETROPERITNL ABD,  LTD  7. Screening for rectal cancer  - POC Hemoccult Bld/Stl   8. Prostate cancer screening  - Microalbumin / creatinine urine ratio  9. Benign localized prostatic hyperplasia with lower urinary tract symptoms (LUTS)  - PSA  10. Acute gout of left knee, unspecified cause  - Uric acid  11. Screening for AAA (aortic abdominal aneurysm)  - [place], RETROPERITNL ABD,  LTD  12. Medication management  - [place], RETROPERITNL ABD,  LTD - POC Hemoccult Bld/Stl (3-Cd Home Screen); Future - Urinalysis, Routine w reflex microscopic - Microalbumin / creatinine urine ratio - PSA - CBC with Differential/Platelet - BASIC METABOLIC PANEL WITH GFR - Hepatic function panel - Magnesium - Lipid panel - TSH - Hemoglobin A1c - Insulin, random - VITAMIN D 25 Hydroxy  - Uric acid      Patient was counseled in prudent diet, weight control to achieve / maintain BMI less than 25, BP monitoring, regular exercise and medications as discussed.  Discussed med effects and SE's. Routine screening labs and tests as requested with regular follow-up as recommended. Over 40 minutes of exam, counseling, chart review and high complex critical decision making was performed

## 2017-02-01 NOTE — Patient Instructions (Signed)

## 2017-02-02 LAB — BASIC METABOLIC PANEL WITH GFR
BUN: 14 mg/dL (ref 7–25)
CHLORIDE: 106 mmol/L (ref 98–110)
CO2: 22 mmol/L (ref 20–31)
Calcium: 9.7 mg/dL (ref 8.6–10.3)
Creat: 0.85 mg/dL (ref 0.70–1.18)
GFR, Est African American: >89 mL/min (ref >=60)
GFR, Est Non African American: 88 mL/min (ref >=60)
Glucose, Bld: 89 mg/dL (ref 65–99)
POTASSIUM: 4.1 mmol/L (ref 3.5–5.3)
SODIUM: 140 mmol/L (ref 135–146)

## 2017-02-02 LAB — INSULIN, RANDOM: INSULIN: 25.6 u[IU]/mL — AB (ref 2.0–19.6)

## 2017-02-02 LAB — LIPID PANEL
Cholesterol: 157 mg/dL (ref <200)
HDL: 39 mg/dL — ABNORMAL LOW (ref >40)
LDL CALC: 79 mg/dL (ref <100)
TRIGLYCERIDES: 196 mg/dL — AB (ref <150)
Total CHOL/HDL Ratio: 4 Ratio (ref <5.0)
VLDL: 39 mg/dL — ABNORMAL HIGH (ref <30)

## 2017-02-02 LAB — URINALYSIS, ROUTINE W REFLEX MICROSCOPIC
BILIRUBIN URINE: NEGATIVE
GLUCOSE, UA: NEGATIVE
HGB URINE DIPSTICK: NEGATIVE
KETONES UR: NEGATIVE
LEUKOCYTES UA: NEGATIVE
Nitrite: NEGATIVE
PH: 5 (ref 5.0–8.0)
Protein, ur: NEGATIVE
Specific Gravity, Urine: 1.025 (ref 1.001–1.035)

## 2017-02-02 LAB — MICROALBUMIN / CREATININE URINE RATIO
Creatinine, Urine: 189 mg/dL (ref 20–370)
MICROALB/CREAT RATIO: 25 ug/mg{creat} (ref <30)
Microalb, Ur: 4.8 mg/dL

## 2017-02-02 LAB — HEPATIC FUNCTION PANEL
ALK PHOS: 85 U/L (ref 40–115)
ALT: 29 U/L (ref 9–46)
AST: 25 U/L (ref 10–35)
Albumin: 4.4 g/dL (ref 3.6–5.1)
BILIRUBIN DIRECT: 0.1 mg/dL (ref <=0.2)
BILIRUBIN INDIRECT: 0.3 mg/dL (ref 0.2–1.2)
BILIRUBIN TOTAL: 0.4 mg/dL (ref 0.2–1.2)
Total Protein: 6.7 g/dL (ref 6.1–8.1)

## 2017-02-02 LAB — URIC ACID: Uric Acid, Serum: 7.4 mg/dL (ref 4.0–8.0)

## 2017-02-02 LAB — VITAMIN D 25 HYDROXY (VIT D DEFICIENCY, FRACTURES): VIT D 25 HYDROXY: 75 ng/mL (ref 30–100)

## 2017-02-02 LAB — PSA: PSA: 1.7 ng/mL (ref <=4.0)

## 2017-02-02 LAB — HEMOGLOBIN A1C
Hgb A1c MFr Bld: 5.3 % (ref <5.7)
MEAN PLASMA GLUCOSE: 105 mg/dL

## 2017-02-02 LAB — TSH: TSH: 0.68 m[IU]/L (ref 0.40–4.50)

## 2017-02-02 LAB — MAGNESIUM: MAGNESIUM: 2 mg/dL (ref 1.5–2.5)

## 2017-02-03 ENCOUNTER — Encounter: Payer: Self-pay | Admitting: Internal Medicine

## 2017-02-03 ENCOUNTER — Other Ambulatory Visit: Payer: Self-pay | Admitting: Internal Medicine

## 2017-02-03 MED ORDER — PREDNISONE 20 MG PO TABS: ORAL_TABLET | ORAL | 1 refills | Status: AC

## 2017-02-16 ENCOUNTER — Encounter: Payer: Self-pay | Admitting: Internal Medicine

## 2017-02-16 ENCOUNTER — Ambulatory Visit (INDEPENDENT_AMBULATORY_CARE_PROVIDER_SITE_OTHER): Payer: Medicare Other | Admitting: *Deleted

## 2017-02-16 DIAGNOSIS — I480 Paroxysmal atrial fibrillation: Secondary | ICD-10-CM | POA: Diagnosis not present

## 2017-02-16 NOTE — Progress Notes (Signed)
[facility] Summary Report / Loop Recorder 

## 2017-02-17 ENCOUNTER — Other Ambulatory Visit: Payer: Self-pay | Admitting: Internal Medicine

## 2017-02-23 LAB — CUP PACEART REMOTE DEVICE CHECK
Date Time Interrogation Session: 20180809193923
Implantable Pulse Generator Implant Date: 20180312

## 2017-03-05 ENCOUNTER — Other Ambulatory Visit: Payer: Self-pay | Admitting: Internal Medicine

## 2017-03-06 ENCOUNTER — Ambulatory Visit (INDEPENDENT_AMBULATORY_CARE_PROVIDER_SITE_OTHER): Payer: Medicare Other | Admitting: Internal Medicine

## 2017-03-06 ENCOUNTER — Encounter: Payer: Self-pay | Admitting: Internal Medicine

## 2017-03-06 VITALS — BP 110/70 | HR 84 | Ht 72.0 in | Wt 240.6 lb

## 2017-03-06 DIAGNOSIS — I480 Paroxysmal atrial fibrillation: Secondary | ICD-10-CM | POA: Diagnosis not present

## 2017-03-06 DIAGNOSIS — I10 Essential (primary) hypertension: Secondary | ICD-10-CM

## 2017-03-06 LAB — CUP PACEART INCLINIC DEVICE CHECK
Date Time Interrogation Session: 20180827145434
Implantable Pulse Generator Implant Date: 20180312

## 2017-03-06 NOTE — Progress Notes (Signed)
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  Since last being seen in our clinic, the patient reports doing very well.  His primary concern is with recent gout in his knee as well as diffuse orthopedic issues.  He continues to think that Xarelto caused him to have some myalgias though he does seem to continue to have issues since switching to eliqus.  Today, he denies symptoms of palpitations, chest pain, shortness of breath,  lower extremity edema, dizziness, presyncope, or syncope.  The patient is otherwise without complaint today.   Past Medical History:  Diagnosis Date  . Arthritis   . BPH (benign prostatic hypertrophy)   . Frequency of urination   . Hyperlipidemia   . Hypertension   . IBS (irritable bowel syndrome)   . Mild acid reflux occasional  . Nocturia   . Obesity   . Pelvic pain in male   . Pre-diabetes   . Urgency of urination   . Vitamin D deficiency   . Wears glasses    Past Surgical History:  Procedure Laterality Date  . ANTERIOR CERVICAL DECOMP/DISCECTOMY FUSION  01-25-2002   C4 - C5  . ANTERIOR CRUCIATE LIGAMENT REPAIR  1990   RIGHT  . APPENDECTOMY  02-07-2007  . CERVICAL FUSION  2000   C5 - 6  . COLONOSCOPY  05-30-2011   HEMORRHOIDS  . HIP SURGERY  1999   LEFT HIP --  REMOVAL CALCIUM BUILD-UP  . LEFT ANKLE SURG.  1980  . LEFT INGUINAL HERNIA REPAIR  1970  . LEFT ROTATOR CUFF REPAIR  2002  . LOOP RECORDER INSERTION N/A 09/19/2016   Procedure: Loop Recorder Insertion;  Surgeon: Thompson Grayer, MD;  Location: [place] [facility];  Service: Cardiovascular;  Laterality: N/A;  . TENDON RECONSTRUCTION Left 07/17/2013   Procedure: LEFT ELBOW LATERAL RECONSTRUCTION;  Surgeon: Schuyler Amor, MD;  Location: [place];  Service: Orthopedics;  Laterality: Left;  . TONSILLECTOMY    . TRIGGER FINGER RELEASE Right 07/17/2013   Procedure: RELEASE A-1  PULLEY RIGHT RING FINGER;  Surgeon: Schuyler Amor, MD;  Location: [place];  Service: Orthopedics;  Laterality: Right;  . UMBILICAL HERNIA REPAIR  2003    ROS- all systems are reviewed and negatives except as per HPI above  Current Outpatient Prescriptions  Medication Sig Dispense Refill  . ALPRAZolam (XANAX) 1 MG tablet Taken 1/2  to 1 tablet by mouth 3 times a day if needed for anxiety    . apixaban (ELIQUIS) 5 MG TABS tablet Take 1 tablet (5 mg total) by mouth 2 (two) times daily. 180 tablet 1  . Carboxymethylcellulose Sodium (REFRESH TEARS OP) Apply 1 drop to eye daily as needed (dry eyes).    . Cholecalciferol (VITAMIN D-3) 5000 UNITS TABS Take 5,000 Units by mouth daily.     . colchicine 0.6 MG tablet 1 TAB TWICE A DAY FOR ACUTE GOUT STOP TAKING WHEN YOUR GOUT DOES NOT HURT! Take as needed/directed  0  . finasteride (PROSCAR) 5 MG tablet Take 5 mg by mouth daily.    . Flaxseed, Linseed, (FLAX SEED OIL PO) Take 1 capsule by mouth daily.    Marland Kitchen ibuprofen (ADVIL,MOTRIN) 200 MG tablet Take 600 mg by mouth daily as needed for headache or moderate pain.    Marland Kitchen losartan (COZAAR) 50 MG tablet Take 25 mg by mouth daily.    Marland Kitchen  Multiple Vitamin (MULTIVITAMIN) tablet Take 1 tablet by mouth daily.    . Omega-3 Fatty Acids (FISH OIL PO) Take 1 capsule by mouth 2 (two) times daily.    Marland Kitchen RAPAFLO 8 MG CAPS capsule Take 8 mg by mouth as needed (Take as directed for urinary rentention).     . rosuvastatin (CRESTOR) 40 MG tablet Take 20 mg by mouth daily.     . tadalafil (CIALIS) 20 MG tablet Take 1/2 to 1 tablet by mouth daily or as directed    . verapamil (CALAN-SR) 120 MG CR tablet Take 60 mg by mouth at bedtime.    . vitamin C (ASCORBIC ACID) 500 MG tablet Take 500 mg by mouth daily.     No current facility-administered medications for this visit.     Physical Exam: Vitals:   03/06/17 1420  BP: 110/70  Pulse: 84  SpO2: 96%  Weight: 240 lb 9.6 oz (109.1 kg)  Height: 6' (1.829  m)   GEN- The patient is well appearing, alert and oriented x 3 today.   Head- normocephalic, atraumatic Eyes-  Sclera clear, conjunctiva pink Ears- hearing intact Oropharynx- clear Lungs- Clear to ausculation bilaterally, normal work of breathing Heart- Regular rate and rhythm, no murmurs, rubs or gallops, PMI not laterally displaced GI- soft, NT, ND, + BS Extremities- no clubbing, cyanosis, or edema  myoview 12/14/16 reviewed with patient today.  Low risk study ILR is reviewed and reveals no afib since his last visit  Assessment and Plan:  1. Paroxysmal atrial fibrillation Doing well s/p ILR implanted.  No afib since his last visit He is now on eliquis rather than xarelto.  Today I discussed the option of stopping eliquis and following clinically for recurrence.  For now, he would like to continue his current therapy.  2. HTN Stable No change required today  carelink Return to see me in 3 months  Thompson Grayer MD, [facility] 03/06/2017 2:48 PM

## 2017-03-06 NOTE — Patient Instructions (Addendum)
Medication Instructions:  Your physician recommends that you continue on your current medications as directed. Please refer to the Current Medication list given to you today.   Labwork: None ordered   Testing/Procedures: None ordered   Follow-Up:  Your physician recommends that you schedule a follow-up appointment in: 3 months with Dr Rayann Heman     Thank you for choosing [facility]!!     Janan Halter, RN [phone]

## 2017-03-08 ENCOUNTER — Other Ambulatory Visit: Payer: Self-pay

## 2017-03-08 DIAGNOSIS — Z79899 Other long term (current) drug therapy: Secondary | ICD-10-CM

## 2017-03-08 DIAGNOSIS — Z1212 Encounter for screening for malignant neoplasm of rectum: Secondary | ICD-10-CM

## 2017-03-08 LAB — POC HEMOCCULT BLD/STL (HOME/3-CARD/SCREEN)
FECAL OCCULT BLD: NEGATIVE
FECAL OCCULT BLD: NEGATIVE
FECAL OCCULT BLD: NEGATIVE

## 2017-03-20 ENCOUNTER — Ambulatory Visit (INDEPENDENT_AMBULATORY_CARE_PROVIDER_SITE_OTHER): Payer: Medicare Other | Admitting: *Deleted

## 2017-03-20 DIAGNOSIS — I480 Paroxysmal atrial fibrillation: Secondary | ICD-10-CM | POA: Diagnosis not present

## 2017-03-21 ENCOUNTER — Encounter: Payer: Self-pay | Admitting: Physician Assistant

## 2017-03-21 MED ORDER — CYCLOBENZAPRINE HCL 10 MG PO TABS: 10.0000 mg | ORAL_TABLET | Freq: Three times a day (TID) | ORAL | 0 refills | Status: DC | PRN

## 2017-03-21 NOTE — Progress Notes (Signed)
[facility] Summary Report / Loop Recorder 

## 2017-03-23 LAB — CUP PACEART REMOTE DEVICE CHECK
Date Time Interrogation Session: 20180908194133
MDC IDC PG IMPLANT DT: 20180312

## 2017-04-17 ENCOUNTER — Ambulatory Visit (INDEPENDENT_AMBULATORY_CARE_PROVIDER_SITE_OTHER): Payer: Medicare Other | Admitting: *Deleted

## 2017-04-17 DIAGNOSIS — I480 Paroxysmal atrial fibrillation: Secondary | ICD-10-CM | POA: Diagnosis not present

## 2017-04-18 NOTE — Progress Notes (Signed)
[facility] Summary Report / Loop Recorder 

## 2017-04-20 LAB — CUP PACEART REMOTE DEVICE CHECK
Date Time Interrogation Session: 20181008201020
Implantable Pulse Generator Implant Date: 20180312

## 2017-05-17 ENCOUNTER — Ambulatory Visit (INDEPENDENT_AMBULATORY_CARE_PROVIDER_SITE_OTHER): Payer: Medicare Other | Admitting: *Deleted

## 2017-05-17 DIAGNOSIS — I480 Paroxysmal atrial fibrillation: Secondary | ICD-10-CM | POA: Diagnosis not present

## 2017-05-18 ENCOUNTER — Ambulatory Visit (INDEPENDENT_AMBULATORY_CARE_PROVIDER_SITE_OTHER): Payer: Medicare Other | Admitting: Podiatry

## 2017-05-18 ENCOUNTER — Encounter: Payer: Self-pay | Admitting: Podiatry

## 2017-05-18 DIAGNOSIS — M7752 Other enthesopathy of left foot: Secondary | ICD-10-CM | POA: Diagnosis not present

## 2017-05-18 DIAGNOSIS — G5792 Unspecified mononeuropathy of left lower limb: Secondary | ICD-10-CM

## 2017-05-18 DIAGNOSIS — M779 Enthesopathy, unspecified: Principal | ICD-10-CM

## 2017-05-18 DIAGNOSIS — M778 Other enthesopathies, not elsewhere classified: Secondary | ICD-10-CM

## 2017-05-18 LAB — CUP PACEART REMOTE DEVICE CHECK
Date Time Interrogation Session: 20181107203927
MDC IDC PG IMPLANT DT: 20180312

## 2017-05-18 NOTE — Progress Notes (Signed)
He presents today states that the dorsal aspect of his left foot he has pain on dorsal aspect of the left foot into the anterolateral aspect of the left foot.  Objective: Vital signs are stable he is alert and oriented 3 pulses are strongly palpable. Also spurring along Lisfranc's joints resulting from osteoarthritic changes causing neuritis of the deep peroneal nerve. He also has pain on palpation of the sinus tarsi and on an range of motion of the subtalar joint.  Assessment: Subtalar joint capsulitis left. Dorsal neuritis and osteoarthritic changes of capsulitis.  Plan: Injected these 2 areas today with Kenalog and local anesthetic L follow-up with him in a couple of months if necessary.

## 2017-05-18 NOTE — Progress Notes (Signed)
[facility] Summary Report / Loop Recorder 

## 2017-05-24 ENCOUNTER — Encounter: Payer: Self-pay | Admitting: Internal Medicine

## 2017-05-24 ENCOUNTER — Ambulatory Visit (INDEPENDENT_AMBULATORY_CARE_PROVIDER_SITE_OTHER): Payer: Medicare Other | Admitting: Internal Medicine

## 2017-05-24 VITALS — BP 120/62 | HR 84 | Ht 72.0 in | Wt 243.0 lb

## 2017-05-24 DIAGNOSIS — I480 Paroxysmal atrial fibrillation: Secondary | ICD-10-CM | POA: Diagnosis not present

## 2017-05-24 DIAGNOSIS — I10 Essential (primary) hypertension: Secondary | ICD-10-CM | POA: Diagnosis not present

## 2017-05-24 LAB — CUP PACEART INCLINIC DEVICE CHECK
MDC IDC PG IMPLANT DT: 20180312
MDC IDC SESS DTM: 20181114141554

## 2017-05-24 NOTE — Patient Instructions (Addendum)
Medication Instructions:  Your physician recommends that you continue on your current medications as directed. Please refer to the Current Medication list given to you today.   -- If you need a refill on your cardiac medications before your next appointment, please call your pharmacy. --  Labwork: None ordered  Testing/Procedures: None ordered  Follow-Up:  Your physician wants you to follow-up in: 6 months with Dr. Rayann Heman.      Thank you for choosing [facility]!!   Frederik Schmidt, RN [phone]  Any Other Special Instructions Will Be Listed Below (If Applicable).

## 2017-05-24 NOTE — Progress Notes (Signed)
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  Since last being seen in our clinic, the patient reports doing very well.  Today, he denies symptoms of palpitations, chest pain, shortness of breath,  lower extremity edema, dizziness, presyncope, or syncope.  The patient is otherwise without complaint today.   Past Medical History:  Diagnosis Date  . Arthritis   . Benign prostatic hyperplasia   . BMI 33.0-33.9,adult 05/21/2015  . BPH (benign prostatic hypertrophy)   . Frequency of urination   . History of SCC (squamous cell carcinoma) of skin 10/04/2016  . Hyperlipidemia   . Hypertension   . IBS (irritable bowel syndrome)   . Mild acid reflux occasional  . Nocturia   . Obesity   . Other abnormal glucose   . Pelvic pain in male   . Pre-diabetes   . Urgency of urination   . Vitamin D deficiency   . Wears glasses    Past Surgical History:  Procedure Laterality Date  . ANTERIOR CERVICAL DECOMP/DISCECTOMY FUSION  01-25-2002   C4 - C5  . ANTERIOR CRUCIATE LIGAMENT REPAIR  1990   RIGHT  . APPENDECTOMY  02-07-2007  . CERVICAL FUSION  2000   C5 - 6  . COLONOSCOPY  05-30-2011   HEMORRHOIDS  . HIP SURGERY  1999   LEFT HIP --  REMOVAL CALCIUM BUILD-UP  . LEFT ANKLE SURG.  1980  . LEFT INGUINAL HERNIA REPAIR  1970  . LEFT ROTATOR CUFF REPAIR  2002  . TONSILLECTOMY    . UMBILICAL HERNIA REPAIR  2003    ROS- all systems are reviewed and negatives except as per HPI above  Current Outpatient Medications  Medication Sig Dispense Refill  . ALPRAZolam (XANAX) 1 MG tablet Taken 1/2  to 1 tablet by mouth 3 times a day if needed for anxiety    . apixaban (ELIQUIS) 5 MG TABS tablet Take 1 tablet (5 mg total) by mouth 2 (two) times daily. 180 tablet 1  . Carboxymethylcellulose Sodium (REFRESH TEARS OP) Apply 1 drop to eye daily as needed (dry eyes).    . Cholecalciferol (VITAMIN D3) 5000  units CAPS Take 1 capsule daily by mouth.    . finasteride (PROSCAR) 5 MG tablet Take 5 mg by mouth daily.    . Flaxseed, Linseed, (FLAX SEED OIL PO) Take 1 capsule by mouth daily.    Marland Kitchen ibuprofen (ADVIL,MOTRIN) 200 MG tablet Take 600 mg by mouth daily as needed for headache or moderate pain.    Marland Kitchen losartan (COZAAR) 50 MG tablet Take 25 mg by mouth daily.    . Multiple Vitamin (MULTIVITAMIN) tablet Take 1 tablet by mouth daily.    . Omega-3 Fatty Acids (FISH OIL PO) Take 1 capsule by mouth 2 (two) times daily.    Marland Kitchen RAPAFLO 8 MG CAPS capsule Take 8 mg by mouth as needed (Take as directed for urinary rentention).     . rosuvastatin (CRESTOR) 40 MG tablet Take 20 mg by mouth daily.     . tadalafil (CIALIS) 20 MG tablet Take 1/2 to 1 tablet by mouth daily or as directed    . verapamil (CALAN-SR) 120 MG CR tablet Take 60 mg by mouth at bedtime.    . vitamin C (ASCORBIC ACID) 500 MG tablet Take 500 mg by mouth daily.     No current facility-administered medications for this visit.  Physical Exam: Vitals:   05/24/17 1352  BP: 120/62  Pulse: 84  SpO2: 96%  Weight: 243 lb (110.2 kg)  Height: 6' (1.829 m)    GEN- The patient is well appearing, alert and oriented x 3 today.   Head- normocephalic, atraumatic Eyes-  Sclera clear, conjunctiva pink Ears- hearing intact Oropharynx- clear Lungs- Clear to ausculation bilaterally, normal work of breathing Heart- Regular rate and rhythm, no murmurs, rubs or gallops, PMI not laterally displaced GI- soft, NT, ND, + BS Extremities- no clubbing, cyanosis, or edema  ILR interrogation from today is personally reviewed and shows low afib burden.  AF burden is 0.5%, longest epsode 8 hours 04/26/17  Assessment and Plan:  1. Paroxysmal atrial fibrillation Doing well afib burden is 0.5% Continue eliquis  2. HTN Stable No change required today  carelink Return in a year  Thompson Grayer MD, [facility] 05/24/2017 2:12 PM

## 2017-05-30 NOTE — Progress Notes (Signed)
FOLLOW UP Assessment:   Essential hypertension - continue medications, DASH diet, exercise and monitor at home. Call if greater than 130/80.  -     CBC with Differential/Platelet -     BASIC METABOLIC PANEL WITH GFR -     Hepatic function panel -     TSH  Paroxysmal atrial fibrillation (HCC) NSR at this time, following Dr. Rayann Heman, continue elliquis  Mixed hyperlipidemia -continue medications, check lipids, decrease fatty foods, increase activity.  -     Lipid panel  Medication management -     Magnesium  Class 1 obesity due to excess calories with serious comorbidity and body mass index (BMI) of 32.0 to 32.9 in adult - long discussion about weight loss, diet, and exercise   Future Appointments  Date Time Provider [place]  06/16/2017  9:20 AM [facility] DEVICE REMOTES [facility]  06/22/2017  9:45 AM Garrel Ridgel, DPM [facility]  09/01/2017 10:30 AM Unk Pinto, MD [facility] None  02/27/2018  3:00 PM Unk Pinto, MD [facility] None     Subjective:  [name] is a 71 y.o. male who presents for 3 month follow up for HTN, hyperlipidemia, and vitamin D Def.   His blood pressure has been controlled at home, today their BP is BP: 136/82 He does workout. He denies chest pain, shortness of breath, dizziness.  Has prox Afib,  Dr. Rayann Heman since that time, on elliquis, CHADSVASC 2, has follow up mid May.  Follows with Dr. Tresa Moore for BPH, currently on meloxicam and finesteride, off of bASA for recent prostatitis.  He is on cholesterol medication, on crestor, has increased this and denies myalgias. His cholesterol is at goal. The cholesterol last visit was:   Lab Results  Component Value Date   CHOL 157 02/01/2017   HDL 39 (L) 02/01/2017   LDLCALC 79 02/01/2017   TRIG 196 (H) 02/01/2017   CHOLHDL 4.0 02/01/2017    Had elevated A1C in 2013, has done well with diet/exercise. Last A1C in the office was:  Lab Results  Component Value  Date   HGBA1C 5.3 02/01/2017   Patient is on Vitamin D supplement.   Lab Results  Component Value Date   VD25OH 48 02/01/2017     Had another SCC on left posterior ear removed follows DERM.  BMI is Body mass index is 32.9 kg/m., he is working on diet and exercise. Wt Readings from Last 3 Encounters:  05/31/17 242 lb 9.6 oz (110 kg)  05/24/17 243 lb (110.2 kg)  03/06/17 240 lb 9.6 oz (109.1 kg)   Medication Review: Current Outpatient Medications on File Prior to Visit  Medication Sig Dispense Refill  . ALPRAZolam (XANAX) 1 MG tablet Taken 1/2  to 1 tablet by mouth 3 times a day if needed for anxiety    . apixaban (ELIQUIS) 5 MG TABS tablet Take 1 tablet (5 mg total) by mouth 2 (two) times daily. 180 tablet 1  . Carboxymethylcellulose Sodium (REFRESH TEARS OP) Apply 1 drop to eye daily as needed (dry eyes).    . Cholecalciferol (VITAMIN D3) 5000 units CAPS Take 1 capsule daily by mouth.    . finasteride (PROSCAR) 5 MG tablet Take 5 mg by mouth daily.    . Flaxseed, Linseed, (FLAX SEED OIL PO) Take 1 capsule by mouth daily.    Marland Kitchen ibuprofen (ADVIL,MOTRIN) 200 MG tablet Take 600 mg by mouth daily as needed for headache or moderate pain.    Marland Kitchen losartan (COZAAR) 50 MG  tablet Take 25 mg by mouth daily.    . Multiple Vitamin (MULTIVITAMIN) tablet Take 1 tablet by mouth daily.    . Omega-3 Fatty Acids (FISH OIL PO) Take 1 capsule by mouth 2 (two) times daily.    Marland Kitchen RAPAFLO 8 MG CAPS capsule Take 8 mg by mouth as needed (Take as directed for urinary rentention).     . rosuvastatin (CRESTOR) 40 MG tablet Take 20 mg by mouth daily.     . tadalafil (CIALIS) 20 MG tablet Take 1/2 to 1 tablet by mouth daily or as directed    . verapamil (CALAN-SR) 120 MG CR tablet Take 60 mg by mouth at bedtime.    . vitamin C (ASCORBIC ACID) 500 MG tablet Take 500 mg by mouth daily.     No current facility-administered medications on file prior to visit.     Current Problems (verified) Patient Active Problem  List   Diagnosis Date Noted  . History of SCC (squamous cell carcinoma) of skin 10/04/2016  . Atrial fibrillation ([place]) 08/01/2016  . BMI 33.0-33.9,adult 05/21/2015  . Medication management 09/30/2013  . Obesity 09/30/2013  . Hypertension   . Hyperlipidemia   . Benign prostatic hyperplasia   . Other abnormal glucose   . IBS (irritable bowel syndrome)   . Vitamin D deficiency     Allergies Allergies  Allergen Reactions  . Xarelto [Rivaroxaban]     Severe body aches  . Clonazepam Other (See Comments)    unknown  . Flomax [Tamsulosin Hcl] Other (See Comments)    hypotension  . Lipitor [Atorvastatin] Other (See Comments)    Myalgias    SURGICAL HISTORY He  has a past surgical history that includes Appendectomy (02-07-2007); Anterior cervical decomp/discectomy fusion (01-25-2002); LEFT INGUINAL HERNIA REPAIR (1970); Umbilical hernia repair (2003); LEFT ROTATOR CUFF REPAIR (2002); Anterior cruciate ligament repair (1990); Cervical fusion (2000); LEFT ANKLE SURG. (1980); Hip surgery (1999); Colonoscopy (05-30-2011); Tonsillectomy; Trigger finger release (Right, 07/17/2013); Tendon reconstruction (Left, 07/17/2013); and LOOP RECORDER INSERTION (N/A, 09/19/2016). FAMILY HISTORY His family history includes Diabetes in his father; Heart disease in his father and mother; Stroke in his mother. SOCIAL HISTORY He  reports that  has never smoked. he has never used smokeless tobacco. He reports that he drinks about 2.4 oz of alcohol per week. He reports that he does not use drugs.   Objective:   Blood pressure 136/82, pulse 64, temperature (!) 97.5 F (36.4 C), height 6' (1.829 m), weight 242 lb 9.6 oz (110 kg), SpO2 96 %. Body mass index is 32.9 kg/m.  General appearance: alert, no distress, WD/WN, male HEENT: normocephalic, sclerae anicteric, TMs pearly, nares patent, no discharge or erythema, pharynx normal Oral cavity: MMM, no lesions Neck: supple, no lymphadenopathy, no thyromegaly, no  masses Heart: RRR, normal S1, S2, no murmurs Lungs: CTA bilaterally, no wheezes, rhonchi, or rales Abdomen: +bs, soft, non tender, non distended, no masses, no hepatomegaly, no splenomegaly Musculoskeletal: nontender, no swelling, no obvious deformity Extremities: no edema, no cyanosis, no clubbing Pulses: 2+ symmetric, upper and lower extremities, normal cap refill Neurological: alert, oriented x 3, CN2-12 intact, strength normal upper extremities and lower extremities, sensation normal throughout, DTRs 2+ throughout, no cerebellar signs, gait normal Psychiatric: normal affect, behavior normal, pleasant     Vicie Mutters, PA-C   05/31/2017

## 2017-05-31 ENCOUNTER — Ambulatory Visit (INDEPENDENT_AMBULATORY_CARE_PROVIDER_SITE_OTHER): Payer: Medicare Other | Admitting: Physician Assistant

## 2017-05-31 ENCOUNTER — Encounter: Payer: Self-pay | Admitting: Physician Assistant

## 2017-05-31 VITALS — BP 136/82 | HR 64 | Temp 97.5°F | Ht 72.0 in | Wt 242.6 lb

## 2017-05-31 DIAGNOSIS — Z23 Encounter for immunization: Secondary | ICD-10-CM

## 2017-05-31 DIAGNOSIS — Z6832 Body mass index (BMI) 32.0-32.9, adult: Secondary | ICD-10-CM | POA: Diagnosis not present

## 2017-05-31 DIAGNOSIS — I480 Paroxysmal atrial fibrillation: Secondary | ICD-10-CM | POA: Diagnosis not present

## 2017-05-31 DIAGNOSIS — I10 Essential (primary) hypertension: Secondary | ICD-10-CM | POA: Diagnosis not present

## 2017-05-31 DIAGNOSIS — Z79899 Other long term (current) drug therapy: Secondary | ICD-10-CM

## 2017-05-31 DIAGNOSIS — E782 Mixed hyperlipidemia: Secondary | ICD-10-CM

## 2017-05-31 DIAGNOSIS — E6609 Other obesity due to excess calories: Secondary | ICD-10-CM | POA: Diagnosis not present

## 2017-05-31 LAB — BASIC METABOLIC PANEL WITH GFR
BUN: 15 mg/dL (ref 7–25)
CHLORIDE: 104 mmol/L (ref 98–110)
CO2: 28 mmol/L (ref 20–32)
Calcium: 9.9 mg/dL (ref 8.6–10.3)
Creat: 0.85 mg/dL (ref 0.70–1.18)
GFR, EST AFRICAN AMERICAN: 102 mL/min/{1.73_m2} (ref >=60)
GFR, EST NON AFRICAN AMERICAN: 88 mL/min/{1.73_m2} (ref >=60)
Glucose, Bld: 85 mg/dL (ref 65–99)
Potassium: 4.2 mmol/L (ref 3.5–5.3)
SODIUM: 140 mmol/L (ref 135–146)

## 2017-05-31 LAB — HEPATIC FUNCTION PANEL
AG Ratio: 1.9 (calc) (ref 1.0–2.5)
ALKALINE PHOSPHATASE (APISO): 73 U/L (ref 40–115)
ALT: 33 U/L (ref 9–46)
AST: 25 U/L (ref 10–35)
Albumin: 4.6 g/dL (ref 3.6–5.1)
BILIRUBIN DIRECT: 0.1 mg/dL (ref 0.0–0.2)
BILIRUBIN INDIRECT: 0.5 mg/dL (ref 0.2–1.2)
BILIRUBIN TOTAL: 0.6 mg/dL (ref 0.2–1.2)
Globulin: 2.4 g/dL (calc) (ref 1.9–3.7)
Total Protein: 7 g/dL (ref 6.1–8.1)

## 2017-05-31 LAB — CBC WITH DIFFERENTIAL/PLATELET
Basophils Absolute: 76 cells/uL (ref 0–200)
Basophils Relative: 0.8 %
EOS PCT: 2.3 %
Eosinophils Absolute: 219 cells/uL (ref 15–500)
HEMATOCRIT: 50.4 % — AB (ref 38.5–50.0)
Hemoglobin: 16.9 g/dL (ref 13.2–17.1)
LYMPHS ABS: 1748 {cells}/uL (ref 850–3900)
MCH: 30.9 pg (ref 27.0–33.0)
MCHC: 33.5 g/dL (ref 32.0–36.0)
MCV: 92.1 fL (ref 80.0–100.0)
MPV: 10.4 fL (ref 7.5–12.5)
Monocytes Relative: 7.1 %
Neutro Abs: 6783 cells/uL (ref 1500–7800)
Neutrophils Relative %: 71.4 %
PLATELETS: 208 10*3/uL (ref 140–400)
RBC: 5.47 10*6/uL (ref 4.20–5.80)
RDW: 12.2 % (ref 11.0–15.0)
TOTAL LYMPHOCYTE: 18.4 %
WBC: 9.5 10*3/uL (ref 3.8–10.8)
WBCMIX: 675 {cells}/uL (ref 200–950)

## 2017-05-31 LAB — LIPID PANEL
Cholesterol: 153 mg/dL (ref <200)
HDL: 60 mg/dL (ref >40)
LDL Cholesterol (Calc): 73 mg/dL (calc)
NON-HDL CHOLESTEROL (CALC): 93 mg/dL (ref <130)
Total CHOL/HDL Ratio: 2.6 (calc) (ref <5.0)
Triglycerides: 113 mg/dL (ref <150)

## 2017-05-31 LAB — TSH: TSH: 1.43 m[IU]/L (ref 0.40–4.50)

## 2017-05-31 NOTE — Patient Instructions (Signed)
    Bad carbs also include fruit juice, alcohol, and sweet tea. These are empty calories that do not signal to your brain that you are full.   Please remember the good carbs are still carbs which convert into sugar. So please measure them out no more than 1/2-1 cup of rice, oatmeal, pasta, and beans  Veggies are however free foods! Pile them on.   Not all fruit is created equal. Please see the list below, the fruit at the bottom is higher in sugars than the fruit at the top. Please avoid all dried fruits.      Simple math prevails.    1st - exercise does not produce significant weight loss - at best one converts fat into muscle , "bulks up", loses inches, but usually stays "weight neutral"     2nd - think of your body weightas a check book: If you eat more calories than you burn up - you save money or gain weight .... Or if you spend more money than you put in the check book, ie burn up more calories than you eat, then you lose weight     3rd - if you walk or run 1 mile, you burn up 100 calories - you have to burn up 3,500 calories to lose 1 pound, ie you have to walk/run 35 miles to lose 1 measly pound. So if you want to lose 10 #, then you have to walk/run 350 miles, so.... clearly exercise is not the solution.     4. So if you consume 1,500 calories, then you have to burn up the equivalent of 15 miles to stay weight neutral - It also stands to reason that if you consume 1,500 cal/day and don't lose weight, then you must be burning up about 1,500 cals/day to stay weight neutral.     5. If you really want to lose weight, you must cut your calorie intake 300 calories /day and at that rate you should lose about 1 # every 3 days.   6. Please purchase Dr Joel Fuhrman's book(s) "The End of Dieting" & "Eat to Live" . It has some great concepts and recipes.      

## 2017-06-16 ENCOUNTER — Telehealth: Payer: Self-pay | Admitting: Internal Medicine

## 2017-06-16 ENCOUNTER — Ambulatory Visit (INDEPENDENT_AMBULATORY_CARE_PROVIDER_SITE_OTHER): Payer: Medicare Other | Admitting: *Deleted

## 2017-06-16 DIAGNOSIS — I480 Paroxysmal atrial fibrillation: Secondary | ICD-10-CM

## 2017-06-16 NOTE — Telephone Encounter (Signed)
Spoke with patient had 4 episodes of AF 06/15/17, total duration 12 minutes, Avg V rate 150 bpm. Patient states he feels fatigue and just not himself. Advised patient to send manual transmission. Manual transmission successful. Full report appears he has been in AFib since 03:48 this morning. Advised patient to take medication as prescribed. Patient scheduled with AFib clinic 06/20/17 at 1:30 pm. Gave patient directions, phone number, and garage code for [facility]. Advised patient we will review report with Dr. Rayann Heman. Advised patient if symptoms get worse over the weekend to go to the Emergency Room. Patient verbalized understanding and appreciative of call back.

## 2017-06-16 NOTE — Telephone Encounter (Signed)
[name] is calling because he wants to know if he was out of rhythm on last night , because feels bad . Please call

## 2017-06-19 NOTE — Progress Notes (Signed)
[facility] Summary Report / Loop Recorder 

## 2017-06-20 ENCOUNTER — Ambulatory Visit (HOSPITAL_COMMUNITY): Payer: Medicare Other | Admitting: Nurse Practitioner

## 2017-06-21 NOTE — Telephone Encounter (Signed)
Reviewed with Dr. Benancio Deeds additional recommendations.  Per automatic transmission this AM, patient back in sinus rhythm.

## 2017-06-22 ENCOUNTER — Other Ambulatory Visit: Payer: Self-pay | Admitting: Internal Medicine

## 2017-06-22 ENCOUNTER — Ambulatory Visit: Payer: Medicare Other | Admitting: Podiatry

## 2017-06-26 LAB — CUP PACEART REMOTE DEVICE CHECK
Date Time Interrogation Session: 20181207210653
MDC IDC PG IMPLANT DT: 20180312

## 2017-06-30 ENCOUNTER — Ambulatory Visit (HOSPITAL_COMMUNITY)
Admission: RE | Admit: 2017-06-30 | Discharge: 2017-06-30 | Disposition: A | Discharge status: Home / Self Care | Payer: Medicare Other | Source: Ambulatory Visit | Attending: Nurse Practitioner | Admitting: Nurse Practitioner

## 2017-06-30 ENCOUNTER — Encounter (HOSPITAL_COMMUNITY): Payer: Self-pay | Admitting: Nurse Practitioner

## 2017-06-30 VITALS — BP 132/68 | HR 79 | Ht 72.0 in | Wt 245.0 lb

## 2017-06-30 DIAGNOSIS — E785 Hyperlipidemia, unspecified: Secondary | ICD-10-CM | POA: Insufficient documentation

## 2017-06-30 DIAGNOSIS — K589 Irritable bowel syndrome without diarrhea: Secondary | ICD-10-CM | POA: Diagnosis not present

## 2017-06-30 DIAGNOSIS — E559 Vitamin D deficiency, unspecified: Secondary | ICD-10-CM | POA: Diagnosis not present

## 2017-06-30 DIAGNOSIS — Z85828 Personal history of other malignant neoplasm of skin: Secondary | ICD-10-CM | POA: Diagnosis not present

## 2017-06-30 DIAGNOSIS — I10 Essential (primary) hypertension: Secondary | ICD-10-CM | POA: Diagnosis not present

## 2017-06-30 DIAGNOSIS — Z791 Long term (current) use of non-steroidal anti-inflammatories (NSAID): Secondary | ICD-10-CM | POA: Diagnosis not present

## 2017-06-30 DIAGNOSIS — Z7901 Long term (current) use of anticoagulants: Secondary | ICD-10-CM | POA: Insufficient documentation

## 2017-06-30 DIAGNOSIS — M4322 Fusion of spine, cervical region: Secondary | ICD-10-CM | POA: Insufficient documentation

## 2017-06-30 DIAGNOSIS — R350 Frequency of micturition: Secondary | ICD-10-CM | POA: Diagnosis not present

## 2017-06-30 DIAGNOSIS — K219 Gastro-esophageal reflux disease without esophagitis: Secondary | ICD-10-CM | POA: Diagnosis not present

## 2017-06-30 DIAGNOSIS — Z823 Family history of stroke: Secondary | ICD-10-CM | POA: Insufficient documentation

## 2017-06-30 DIAGNOSIS — Z79899 Other long term (current) drug therapy: Secondary | ICD-10-CM | POA: Insufficient documentation

## 2017-06-30 DIAGNOSIS — Z9889 Other specified postprocedural states: Secondary | ICD-10-CM | POA: Insufficient documentation

## 2017-06-30 DIAGNOSIS — R7303 Prediabetes: Secondary | ICD-10-CM | POA: Diagnosis not present

## 2017-06-30 DIAGNOSIS — Z8249 Family history of ischemic heart disease and other diseases of the circulatory system: Secondary | ICD-10-CM | POA: Diagnosis not present

## 2017-06-30 DIAGNOSIS — I480 Paroxysmal atrial fibrillation: Secondary | ICD-10-CM

## 2017-06-30 DIAGNOSIS — E669 Obesity, unspecified: Secondary | ICD-10-CM | POA: Insufficient documentation

## 2017-06-30 DIAGNOSIS — Z888 Allergy status to other drugs, medicaments and biological substances status: Secondary | ICD-10-CM | POA: Insufficient documentation

## 2017-06-30 DIAGNOSIS — Z833 Family history of diabetes mellitus: Secondary | ICD-10-CM | POA: Insufficient documentation

## 2017-06-30 DIAGNOSIS — N401 Enlarged prostate with lower urinary tract symptoms: Secondary | ICD-10-CM | POA: Insufficient documentation

## 2017-06-30 NOTE — Progress Notes (Signed)
Primary Care Physician: Unk Pinto, MD Referring Physician: Dr. Tilford Pillar [name] is a 71 y.o. male with a h/o paroxysmal afib that is in the afib clinic for f/u episode of afib. He was diagnosed with afib one year ago with dx of bronchitis. He has been quiet since then with verapamil daily. He feels the episode the other night may have been triggered by 2 beers.Triggers discussed. He dos not smoke or drink excessive caffeine. He does not snore. He is inactive and overweight and plans to start a new walking routine after the first of the year. He took an extra 1/2 tab of verapamil and he returned to SR with recent breakthrough afib.   Today, he denies symptoms of palpitations, chest pain, shortness of breath, orthopnea, PND, lower extremity edema, dizziness, presyncope, syncope, or neurologic sequela. The patient is tolerating medications without difficulties and is otherwise without complaint today.   Past Medical History:  Diagnosis Date  . Arthritis   . Benign prostatic hyperplasia   . BMI 33.0-33.9,adult 05/21/2015  . BPH (benign prostatic hypertrophy)   . Frequency of urination   . History of SCC (squamous cell carcinoma) of skin 10/04/2016  . Hyperlipidemia   . Hypertension   . IBS (irritable bowel syndrome)   . Mild acid reflux occasional  . Nocturia   . Obesity   . Other abnormal glucose   . Pelvic pain in male   . Pre-diabetes   . Urgency of urination   . Vitamin D deficiency   . Wears glasses    Past Surgical History:  Procedure Laterality Date  . ANTERIOR CERVICAL DECOMP/DISCECTOMY FUSION  01-25-2002   C4 - C5  . ANTERIOR CRUCIATE LIGAMENT REPAIR  1990   RIGHT  . APPENDECTOMY  02-07-2007  . CERVICAL FUSION  2000   C5 - 6  . COLONOSCOPY  05-30-2011   HEMORRHOIDS  . HIP SURGERY  1999   LEFT HIP --  REMOVAL CALCIUM BUILD-UP  . LEFT ANKLE SURG.  1980  . LEFT INGUINAL HERNIA REPAIR  1970  . LEFT ROTATOR CUFF REPAIR  2002  . LOOP RECORDER INSERTION  N/A 09/19/2016   Procedure: Loop Recorder Insertion;  Surgeon: Thompson Grayer, MD;  Location: [facility];  Service: Cardiovascular;  Laterality: N/A;  . TENDON RECONSTRUCTION Left 07/17/2013   Procedure: LEFT ELBOW LATERAL RECONSTRUCTION;  Surgeon: Schuyler Amor, MD;  Location: [facility];  Service: Orthopedics;  Laterality: Left;  . TONSILLECTOMY    . TRIGGER FINGER RELEASE Right 07/17/2013   Procedure: RELEASE A-1 PULLEY RIGHT RING FINGER;  Surgeon: Schuyler Amor, MD;  Location: [facility];  Service: Orthopedics;  Laterality: Right;  . UMBILICAL HERNIA REPAIR  2003    Current Outpatient Medications  Medication Sig Dispense Refill  . ALPRAZolam (XANAX) 1 MG tablet Taken 1/2  to 1 tablet by mouth 3 times a day if needed for anxiety    . apixaban (ELIQUIS) 5 MG TABS tablet Take 1 tablet (5 mg total) by mouth 2 (two) times daily. 180 tablet 1  . Carboxymethylcellulose Sodium (REFRESH TEARS OP) Apply 1 drop to eye daily as needed (dry eyes).    . Cholecalciferol (VITAMIN D3) 5000 units CAPS Take 1 capsule daily by mouth.    . finasteride (PROSCAR) 5 MG tablet Take 5 mg by mouth daily.    . Flaxseed, Linseed, (FLAX SEED OIL PO) Take 1 capsule by mouth daily.    Marland Kitchen ibuprofen (ADVIL,MOTRIN)  200 MG tablet Take 600 mg by mouth daily as needed for headache or moderate pain.    Marland Kitchen losartan (COZAAR) 50 MG tablet Take 25 mg by mouth daily.    . Multiple Vitamin (MULTIVITAMIN) tablet Take 1 tablet by mouth daily.    . Omega-3 Fatty Acids (FISH OIL PO) Take 1 capsule by mouth 2 (two) times daily.    Marland Kitchen RAPAFLO 8 MG CAPS capsule Take 8 mg by mouth as needed (Take as directed for urinary rentention).     . rosuvastatin (CRESTOR) 40 MG tablet Take 20 mg by mouth daily.     . tadalafil (CIALIS) 20 MG tablet Take 1/2 to 1 tablet by mouth daily or as directed    . verapamil (CALAN-SR) 120 MG CR tablet Take 60 mg by mouth at bedtime.    . vitamin C (ASCORBIC ACID) 500 MG  tablet Take 500 mg by mouth daily.     No current facility-administered medications for this encounter.     Allergies  Allergen Reactions  . Xarelto [Rivaroxaban]     Severe body aches  . Clonazepam Other (See Comments)    unknown  . Flomax [Tamsulosin Hcl] Other (See Comments)    hypotension  . Lipitor [Atorvastatin] Other (See Comments)    Myalgias    Social History   Socioeconomic History  . Marital status: Married    Spouse name: Not on file  . Number of children: 1  . Years of education: Not on file  . Highest education level: Not on file  Social Needs  . Financial resource strain: Not on file  . Food insecurity - worry: Not on file  . Food insecurity - inability: Not on file  . Transportation needs - medical: Not on file  . Transportation needs - non-medical: Not on file  Occupational History  . Occupation: Personnel officer  Tobacco Use  . Smoking status: Never Smoker  . Smokeless tobacco: Never Used  Substance and Sexual Activity  . Alcohol use: Yes    Alcohol/week: 2.4 oz    Types: 4 Standard drinks or equivalent per week    Comment: OCCASIONAL  . Drug use: No  . Sexual activity: Not on file  Other Topics Concern  . Not on file  Social History Narrative  . Not on file    Family History  Problem Relation Age of Onset  . Heart disease Mother   . Stroke Mother   . Heart disease Father   . Diabetes Father     ROS- All systems are reviewed and negative except as per the HPI above  Physical Exam: Vitals:   06/30/17 1038  BP: 132/68  Pulse: 79  Weight: 245 lb (111.1 kg)  Height: 6' (1.829 m)   Wt Readings from Last 3 Encounters:  06/30/17 245 lb (111.1 kg)  05/31/17 242 lb 9.6 oz (110 kg)  05/24/17 243 lb (110.2 kg)    Labs: Lab Results  Component Value Date   NA 140 05/31/2017   K 4.2 05/31/2017   CL 104 05/31/2017   CO2 28 05/31/2017   GLUCOSE 85 05/31/2017   BUN 15 05/31/2017   CREATININE 0.85 05/31/2017   CALCIUM 9.9  05/31/2017   MG 2.0 02/01/2017   Lab Results  Component Value Date   INR 1.0 02/07/2007   Lab Results  Component Value Date   CHOL 153 05/31/2017   HDL 60 05/31/2017   LDLCALC 79 02/01/2017   TRIG 113 05/31/2017  GEN- The patient is well appearing, alert and oriented x 3 today.   Head- normocephalic, atraumatic Eyes-  Sclera clear, conjunctiva pink Ears- hearing intact Oropharynx- clear Neck- supple, no JVP Lymph- no cervical lymphadenopathy Lungs- Clear to ausculation bilaterally, normal work of breathing Heart- Regular rate and rhythm, no murmurs, rubs or gallops, PMI not laterally displaced GI- soft, NT, ND, + BS Extremities- no clubbing, cyanosis, or edema MS- no significant deformity or atrophy Skin- no rash or lesion Psych- euthymic mood, full affect Neuro- strength and sensation are intact  EKG- NSR at 79 bpm, pr int 136 ms, qrs int 90 ms, qtc 417 ms Epic records reviewed    Assessment and Plan: 1. Paroxysmal afib Overall burden has been low Had recent breakthrough afib that responded well with an extra 1/2 tab verapamil Triggers discussed and he will cut back on alcohol Regular walking routine and calorie reduction encouraged and he appears motivated to do so as he has planned a trip to [facility] in April and wants to be able to walk with his grand kids to see the city Continue eliquis 5 mg bid for a chadsvasc score of at least 2  2. Linq Per device clinc  3. HTN Stable  afib clinic as needed   [name]. [name], [facility] [address] [place], [place] [address] [phone]

## 2017-07-17 ENCOUNTER — Ambulatory Visit (INDEPENDENT_AMBULATORY_CARE_PROVIDER_SITE_OTHER): Payer: Medicare Other | Admitting: *Deleted

## 2017-07-17 DIAGNOSIS — I480 Paroxysmal atrial fibrillation: Secondary | ICD-10-CM | POA: Diagnosis not present

## 2017-07-17 NOTE — Progress Notes (Signed)
[facility] Summary Report / Loop Recorder 

## 2017-07-27 DIAGNOSIS — M1812 Unilateral primary osteoarthritis of first carpometacarpal joint, left hand: Secondary | ICD-10-CM | POA: Diagnosis not present

## 2017-07-27 DIAGNOSIS — G8929 Other chronic pain: Secondary | ICD-10-CM | POA: Insufficient documentation

## 2017-07-27 DIAGNOSIS — M79645 Pain in left finger(s): Secondary | ICD-10-CM | POA: Insufficient documentation

## 2017-07-28 LAB — CUP PACEART REMOTE DEVICE CHECK
Date Time Interrogation Session: 20190106214012
MDC IDC PG IMPLANT DT: 20180312

## 2017-08-07 ENCOUNTER — Encounter: Payer: Self-pay | Admitting: Physician Assistant

## 2017-08-14 DIAGNOSIS — M542 Cervicalgia: Secondary | ICD-10-CM | POA: Diagnosis not present

## 2017-08-15 ENCOUNTER — Ambulatory Visit (INDEPENDENT_AMBULATORY_CARE_PROVIDER_SITE_OTHER): Payer: Medicare Other | Admitting: *Deleted

## 2017-08-15 DIAGNOSIS — I480 Paroxysmal atrial fibrillation: Secondary | ICD-10-CM | POA: Diagnosis not present

## 2017-08-16 ENCOUNTER — Telehealth: Payer: Self-pay | Admitting: Internal Medicine

## 2017-08-16 DIAGNOSIS — M4722 Other spondylosis with radiculopathy, cervical region: Secondary | ICD-10-CM | POA: Diagnosis not present

## 2017-08-16 DIAGNOSIS — M5030 Other cervical disc degeneration, unspecified cervical region: Secondary | ICD-10-CM | POA: Diagnosis not present

## 2017-08-16 DIAGNOSIS — M5412 Radiculopathy, cervical region: Secondary | ICD-10-CM | POA: Diagnosis not present

## 2017-08-16 DIAGNOSIS — M542 Cervicalgia: Secondary | ICD-10-CM | POA: Diagnosis not present

## 2017-08-16 NOTE — Progress Notes (Signed)
[facility] Summary Report / Loop Recorder 

## 2017-08-16 NOTE — Telephone Encounter (Signed)
New message   Pt c/o medication issue:  1. Name of Medication: apixaban (ELIQUIS) 5 MG TABS tablet  2. How are you currently taking this medication (dosage and times per day)? Take 1 tablet (5 mg total) by mouth 2 (two) times daily  3. Are you having a reaction (difficulty breathing--STAT)? no  4. What is your medication issue? Patient says that his Dr. Dwaine Gale spine specialist put him on prednisone and told him to stop his Eliquis, pt says he stopped taking the Eliquis yesterday.

## 2017-08-16 NOTE — Telephone Encounter (Signed)
It appears that he has had several recent questions about anticoagulation.  I will ask [facility] pharmacist to follow-up with him.  Megan, please call patient.

## 2017-08-17 DIAGNOSIS — M542 Cervicalgia: Secondary | ICD-10-CM | POA: Diagnosis not present

## 2017-08-17 DIAGNOSIS — M4722 Other spondylosis with radiculopathy, cervical region: Secondary | ICD-10-CM | POA: Diagnosis not present

## 2017-08-17 NOTE — Telephone Encounter (Signed)
Called patient - he states his spine MD told him to stop his Eliquis because it would interact with his prednisone which would also thin his blood. This is incorrect; there is no interaction between the two.  Advised patient to resume his Eliquis which he has been off of for 2 days now. He verbalized understanding and had no further questions.

## 2017-08-23 ENCOUNTER — Other Ambulatory Visit: Payer: Self-pay | Admitting: Internal Medicine

## 2017-08-23 NOTE — Telephone Encounter (Signed)
Returned call to patient who requested samples of Eliquis.   Will be out of town until 2/25 and took his last pill this morning.  Refill dept will leave 2 wks of samples at front desk.  Pt will pick up today.  Pt has requested refill from [facility] but it will not arrive before he leaves to go out of town.

## 2017-08-23 NOTE — Telephone Encounter (Signed)
Patient calling the office for samples of medication: ° ° °1.  What medication and dosage are you requesting samples for? ° °apixaban (ELIQUIS) 5 MG TABS tablet ° °2.  Are you currently out of this medication?  yes ° ° °

## 2017-08-23 NOTE — Telephone Encounter (Signed)
Follow up     Patient following up on sample request 

## 2017-08-30 ENCOUNTER — Telehealth: Payer: Self-pay | Admitting: Cardiology

## 2017-08-30 DIAGNOSIS — M5412 Radiculopathy, cervical region: Secondary | ICD-10-CM | POA: Diagnosis not present

## 2017-08-30 DIAGNOSIS — Z6831 Body mass index (BMI) 31.0-31.9, adult: Secondary | ICD-10-CM | POA: Diagnosis not present

## 2017-08-30 DIAGNOSIS — M4722 Other spondylosis with radiculopathy, cervical region: Secondary | ICD-10-CM | POA: Diagnosis not present

## 2017-08-30 DIAGNOSIS — M542 Cervicalgia: Secondary | ICD-10-CM | POA: Diagnosis not present

## 2017-08-30 DIAGNOSIS — M5020 Other cervical disc displacement, unspecified cervical region: Secondary | ICD-10-CM | POA: Diagnosis not present

## 2017-08-30 DIAGNOSIS — M5030 Other cervical disc degeneration, unspecified cervical region: Secondary | ICD-10-CM | POA: Diagnosis not present

## 2017-08-30 NOTE — Telephone Encounter (Signed)
Spoke w/ pt and requested that he send a manual transmission b/c his home monitor has not updated in at least 14 days.   

## 2017-08-31 ENCOUNTER — Telehealth: Payer: Self-pay

## 2017-08-31 NOTE — Telephone Encounter (Signed)
   Primary Cardiologist: Thompson Grayer, MD  Chart reviewed as part of pre-operative protocol coverage. Patient was contacted 08/31/2017 in reference to pre-operative risk assessment for pending surgery as outlined below.  [name] was last seen on 06/30/2017 by Roderic Palau, NP.  Since that day, [name] has done well.  Therefore, based on ACC/AHA guidelines, the patient would be at acceptable risk for the planned procedure without further cardiovascular testing.   [name] stated that he has an appointment with Dr. Sherwood Gambler in 6 weeks.  At that time, they will decide if he is to have the surgery or not.  I advised him that because he has had no events and has normal renal function, CHA2DS2VASc= 2 (age x 1, HTN), he can hold the Eliquis for 3 days prior to the procedure, no cross coverage with Lovenox needed.  I will route this recommendation to the requesting party via Epic fax function and remove from pre-op pool.  [name] will call if he decides to have the procedure.  Please call with questions.  Rosaria Ferries, PA-C 08/31/2017, 4:26 PM

## 2017-08-31 NOTE — Telephone Encounter (Addendum)
    [facility] Pre-operative Risk Assessment    Request for surgical clearance:  1. What type of surgery is being performed? Cervical Fusion  2. When is this surgery scheduled? TBD  3. What type of clearance is required (medical clearance vs. Pharmacy clearance to hold med vs. Both)? Both  4. Are there any medications that need to be held prior to surgery and how long? Eliquis prior to surgery   5. Practice name and name of physician performing surgery? [facility]    6. What is your office phone and fax number? Phone: [phone] Fax: [phone]  7. Anesthesia type (None, local, MAC, general) ? None Specified    Mendel Ryder 08/31/2017, 3:08 PM  _________________________________________________________________   (provider comments below)

## 2017-09-01 ENCOUNTER — Ambulatory Visit: Payer: Self-pay | Admitting: Internal Medicine

## 2017-09-05 LAB — CUP PACEART REMOTE DEVICE CHECK
Date Time Interrogation Session: 20190205224011
Implantable Pulse Generator Implant Date: 20180312

## 2017-09-11 DIAGNOSIS — R351 Nocturia: Secondary | ICD-10-CM | POA: Diagnosis not present

## 2017-09-11 DIAGNOSIS — R310 Gross hematuria: Secondary | ICD-10-CM | POA: Diagnosis not present

## 2017-09-11 DIAGNOSIS — N401 Enlarged prostate with lower urinary tract symptoms: Secondary | ICD-10-CM | POA: Diagnosis not present

## 2017-09-11 DIAGNOSIS — L570 Actinic keratosis: Secondary | ICD-10-CM | POA: Diagnosis not present

## 2017-09-11 DIAGNOSIS — L578 Other skin changes due to chronic exposure to nonionizing radiation: Secondary | ICD-10-CM | POA: Diagnosis not present

## 2017-09-11 DIAGNOSIS — D1801 Hemangioma of skin and subcutaneous tissue: Secondary | ICD-10-CM | POA: Diagnosis not present

## 2017-09-11 DIAGNOSIS — L812 Freckles: Secondary | ICD-10-CM | POA: Diagnosis not present

## 2017-09-11 DIAGNOSIS — Z85828 Personal history of other malignant neoplasm of skin: Secondary | ICD-10-CM | POA: Diagnosis not present

## 2017-09-11 DIAGNOSIS — L821 Other seborrheic keratosis: Secondary | ICD-10-CM | POA: Diagnosis not present

## 2017-09-12 ENCOUNTER — Ambulatory Visit (INDEPENDENT_AMBULATORY_CARE_PROVIDER_SITE_OTHER): Payer: Medicare Other | Admitting: Internal Medicine

## 2017-09-12 VITALS — BP 122/74 | HR 76 | Temp 97.6°F | Resp 18 | Ht 72.0 in | Wt 236.8 lb

## 2017-09-12 DIAGNOSIS — Z79899 Other long term (current) drug therapy: Secondary | ICD-10-CM

## 2017-09-12 DIAGNOSIS — E559 Vitamin D deficiency, unspecified: Secondary | ICD-10-CM

## 2017-09-12 DIAGNOSIS — I480 Paroxysmal atrial fibrillation: Secondary | ICD-10-CM | POA: Diagnosis not present

## 2017-09-12 DIAGNOSIS — I10 Essential (primary) hypertension: Secondary | ICD-10-CM | POA: Diagnosis not present

## 2017-09-12 DIAGNOSIS — R7303 Prediabetes: Secondary | ICD-10-CM

## 2017-09-12 DIAGNOSIS — E782 Mixed hyperlipidemia: Secondary | ICD-10-CM

## 2017-09-12 DIAGNOSIS — R7309 Other abnormal glucose: Secondary | ICD-10-CM

## 2017-09-12 DIAGNOSIS — R221 Localized swelling, mass and lump, neck: Secondary | ICD-10-CM

## 2017-09-12 NOTE — Progress Notes (Signed)
This very nice 72 y.o. MWM presents for 3 month follow up with HTN, pAfib,  HLD, Pre-Diabetes and Vitamin D Deficiency.  Patient is followed by Dr Carrie Mew for LUTS and was seen by him yesterday. Patient also is s/p ACD/fusion  By Dr Rita Ohara and has upcoming OV for re-evaluation of ? SS at C3/C4 levels. He also expresses concerns re: a "hard" lump in his Rt neck.       Patient is treated for HTN (2007) & BP has been controlled at home. Today's BP is at goal - 122/74. Patient is followed by Dr Rayann Heman for pAfib (attributed to Decongestants)  & is on Eliquis.  Patient has had no complaints of any cardiac type chest pain, palpitations, dyspnea / orthopnea / PND, dizziness, claudication, or dependent edema.     Hyperlipidemia is controlled with diet & meds. Patient denies myalgias or other med SE's. Last Lipids were at goal:  Lab Results  Component Value Date   CHOL 153 05/31/2017   HDL 60 05/31/2017   LDLCALC 79 02/01/2017   TRIG 113 05/31/2017   CHOLHDL 2.6 05/31/2017      Also, the patient has history of PreDiabetes (A1c 5.7%/2013) nd has had no symptoms of reactive hypoglycemia, diabetic polys, paresthesias or visual blurring.  Last A1c was was Normal & at goal: Lab Results  Component Value Date   HGBA1C 5.3 02/01/2017      Further, the patient also has history of Vitamin D Deficiency  ("33"/2018) and supplements vitamin D without any suspected side-effects. Last vitamin D was at goal:  Lab Results  Component Value Date   VD25OH 40 02/01/2017   Current Outpatient Medications on File Prior to Visit  Medication Sig  . ALPRAZolam (XANAX) 1 MG tablet Taken 1/2  to 1 tablet by mouth 3 times a day if needed for anxiety  . Carboxymethylcellulose Sodium (REFRESH TEARS OP) Apply 1 drop to eye daily as needed (dry eyes).  . Cholecalciferol (VITAMIN D3) 5000 units CAPS Take 1 capsule daily by mouth.  . diclofenac sodium (VOLTAREN) 1 % GEL Apply topically 4 (four) times daily.  Marland Kitchen ELIQUIS 5 MG  TABS tablet TAKE 1 TABLET BY MOUTH TWO  TIMES DAILY  . finasteride (PROSCAR) 5 MG tablet Take 5 mg by mouth daily.  . Flaxseed, Linseed, (FLAX SEED OIL PO) Take 1 capsule by mouth daily.  Marland Kitchen losartan (COZAAR) 50 MG tablet Take 25 mg by mouth daily.  . Multiple Vitamin (MULTIVITAMIN) tablet Take 1 tablet by mouth daily.  . Omega-3 Fatty Acids (FISH OIL PO) Take 1 capsule by mouth 2 (two) times daily.  Marland Kitchen RAPAFLO 8 MG CAPS capsule Take 8 mg by mouth as needed (Take as directed for urinary rentention).   . rosuvastatin (CRESTOR) 40 MG tablet Take 20 mg by mouth daily.   . tadalafil (CIALIS) 20 MG tablet Take 1/2 to 1 tablet by mouth daily or as directed  . verapamil (CALAN-SR) 120 MG CR tablet TAKE 1 TABLET BY MOUTH  DAILY  . vitamin C (ASCORBIC ACID) 500 MG tablet Take 500 mg by mouth daily.   No current facility-administered medications on file prior to visit.    Allergies  Allergen Reactions  . Xarelto [Rivaroxaban]     Severe body aches  . Clonazepam Other (See Comments)    unknown  . Flomax [Tamsulosin Hcl] Other (See Comments)    hypotension  . Lipitor [Atorvastatin] Other (See Comments)    Myalgias   PMHx:  Past Medical History:  Diagnosis Date  . Arthritis   . Benign prostatic hyperplasia   . BMI 33.0-33.9,adult 05/21/2015  . BPH (benign prostatic hypertrophy)   . Frequency of urination   . History of SCC (squamous cell carcinoma) of skin 10/04/2016  . Hyperlipidemia   . Hypertension   . IBS (irritable bowel syndrome)   . Mild acid reflux occasional  . Nocturia   . Obesity   . Other abnormal glucose   . Pelvic pain in male   . Pre-diabetes   . Urgency of urination   . Vitamin D deficiency   . Wears glasses    Immunization History  Administered Date(s) Administered  . DTaP 07/11/2005  . Influenza Whole 04/02/2013  . Influenza, High Dose Seasonal PF 05/01/2014, 05/21/2015, 03/29/2016, 05/31/2017  . Pneumococcal Conjugate-13 05/01/2014  . Pneumococcal  Polysaccharide-23 09/08/2008, 03/29/2016  . Td 12/16/2015  . Zoster 12/09/2008   Past Surgical History:  Procedure Laterality Date  . ANTERIOR CERVICAL DECOMP/DISCECTOMY FUSION  01-25-2002   C4 - C5  . ANTERIOR CRUCIATE LIGAMENT REPAIR  1990   RIGHT  . APPENDECTOMY  02-07-2007  . CERVICAL FUSION  2000   C5 - 6  . COLONOSCOPY  05-30-2011   HEMORRHOIDS  . HIP SURGERY  1999   LEFT HIP --  REMOVAL CALCIUM BUILD-UP  . LEFT ANKLE SURG.  1980  . LEFT INGUINAL HERNIA REPAIR  1970  . LEFT ROTATOR CUFF REPAIR  2002  . LOOP RECORDER INSERTION N/A 09/19/2016   Procedure: Loop Recorder Insertion;  Surgeon: Thompson Grayer, MD;  Location: [facility];  Service: Cardiovascular;  Laterality: N/A;  . TENDON RECONSTRUCTION Left 07/17/2013   Procedure: LEFT ELBOW LATERAL RECONSTRUCTION;  Surgeon: Schuyler Amor, MD;  Location: [facility];  Service: Orthopedics;  Laterality: Left;  . TONSILLECTOMY    . TRIGGER FINGER RELEASE Right 07/17/2013   Procedure: RELEASE A-1 PULLEY RIGHT RING FINGER;  Surgeon: Schuyler Amor, MD;  Location: [facility];  Service: Orthopedics;  Laterality: Right;  . UMBILICAL HERNIA REPAIR  2003   FHx:    Reviewed / unchanged  SHx:    Reviewed / unchanged  Systems Review:  Constitutional: Denies fever, chills, wt changes, headaches, insomnia, fatigue, night sweats, change in appetite. Eyes: Denies redness, blurred vision, diplopia, discharge, itchy, watery eyes.  ENT: Denies discharge, congestion, post nasal drip, epistaxis, sore throat, earache, hearing loss, dental pain, tinnitus, vertigo, sinus pain, snoring.  CV: Denies chest pain, palpitations, irregular heartbeat, syncope, dyspnea, diaphoresis, orthopnea, PND, claudication or edema. Respiratory: denies cough, dyspnea, DOE, pleurisy, hoarseness, laryngitis, wheezing.  Gastrointestinal: Denies dysphagia, odynophagia, heartburn, reflux, water brash, abdominal pain or cramps, nausea,  vomiting, bloating, diarrhea, constipation, hematemesis, melena, hematochezia  or hemorrhoids. Genitourinary: Denies dysuria, frequency, urgency, nocturia, hesitancy, discharge, hematuria or flank pain. Musculoskeletal: Denies arthralgias, myalgias, stiffness, jt. swelling, pain, limping or strain/sprain.  Skin: Denies pruritus, rash, hives, warts, acne, eczema or change in skin lesion(s). Neuro: No weakness, tremor, incoordination, spasms, paresthesia or pain. Psychiatric: Denies confusion, memory loss or sensory loss. Endo: Denies change in weight, skin or hair change.  Heme/Lymph: No excessive bleeding, bruising or enlarged lymph nodes.  Physical Exam  BP 122/74   Pulse 76   Temp 97.6 F (36.4 C)   Resp 18   Ht 6' (1.829 m)   Wt 236 lb 12.8 oz (107.4 kg)   BMI 32.12 kg/m   Appears  well nourished, well groomed  and in no  distress.  Eyes: PERRLA, EOMs, conjunctiva no swelling or erythema. Sinuses: No frontal/maxillary tenderness ENT/Mouth: EAC's clear, TM's nl w/o erythema, bulging. Nares clear w/o erythema, swelling, exudates. Oropharynx clear without erythema or exudates. Oral hygiene is good. Tongue normal, non obstructing. Hearing intact.  Neck: Supple. Thyroid not palpable. Car 2+/2+ without bruits or JVD. There is a firm non-tender 1.5 cm movable mass in the Rt supraclavicular fossa.  Chest: Respirations nl with BS clear & equal w/o rales, rhonchi, wheezing or stridor.  Cor: Heart sounds normal w/ regular rate and rhythm without sig. murmurs, gallops, clicks or rubs. Peripheral pulses normal and equal  without edema.  Abdomen: Soft & bowel sounds normal. Non-tender w/o guarding, rebound, hernias, masses or organomegaly.  Lymphatics: Unremarkable.  Musculoskeletal: Full ROM all peripheral extremities, joint stability, 5/5 strength and normal gait.  Skin: Warm, dry without exposed rashes, lesions or ecchymosis apparent.  Neuro: Cranial nerves intact, reflexes equal bilaterally.  Sensory-motor testing grossly intact. Tendon reflexes grossly intact.  Pysch: Alert & oriented x 3.  Insight and judgement nl & appropriate. No ideations.  Assessment and Plan:  1. Essential hypertension  - Continue medication, monitor blood pressure at home.  - Continue DASH diet. Reminder to go to the ER if any CP,  SOB, nausea, dizziness, severe HA, changes vision/speech.  - CBC with Differential/Platelet - BASIC METABOLIC PANEL WITH GFR - Magnesium - TSH  2. Hyperlipidemia, mixed  - Continue diet/meds, exercise,& lifestyle modifications.  - Continue monitor periodic cholesterol/liver & renal functions   - Hepatic function panel - Lipid panel - TSH  3. Abnormal glucose  - Continue diet, exercise, lifestyle modifications.  - Monitor appropriate labs.  - Hemoglobin A1c - Insulin, random  4. Vitamin D deficiency  - Continue supplementation.  - VITAMIN D 25 Hydroxyl  5. Pre-diabetes  - Hemoglobin A1c - Insulin, random  6. Paroxysmal atrial fibrillation (HCC)  - TSH  7. Medication management  - CBC with Differential/Platelet - BASIC METABOLIC PANEL WITH GFR - Hepatic function panel - Magnesium - Lipid panel - TSH - Hemoglobin A1c - Insulin, random - VITAMIN D 25 Hydroxyl         Discussed  regular exercise, BP monitoring, weight control to achieve/maintain BMI less than 25 and discussed med and SE's. Recommended labs to assess and monitor clinical status with further disposition pending results of labs. Over 30 minutes of exam, counseling, chart review was performed.

## 2017-09-12 NOTE — Patient Instructions (Addendum)
Atrial Fibrillation Atrial fibrillation is a type of irregular or rapid heartbeat (arrhythmia). In atrial fibrillation, the heart quivers continuously in a chaotic pattern. This occurs when parts of the heart receive disorganized signals that make the heart unable to pump blood normally. This can increase the risk for stroke, heart failure, and other heart-related conditions. There are different types of atrial fibrillation, including:  Paroxysmal atrial fibrillation. This type starts suddenly, and it usually stops on its own shortly after it starts.  Persistent atrial fibrillation. This type often lasts longer than a week. It may stop on its own or with treatment.  Long-lasting persistent atrial fibrillation. This type lasts longer than 12 months.  Permanent atrial fibrillation. This type does not go away.  Talk with your health care provider to learn about the type of atrial fibrillation that you have. What are the causes? This condition is caused by some heart-related conditions or procedures, including:  A heart attack.  Coronary artery disease.  Heart failure.  Heart valve conditions.  High blood pressure.  Inflammation of the sac that surrounds the heart (pericarditis).  Heart surgery.  Certain heart rhythm disorders, such as Wolf-Parkinson-White syndrome.  Other causes include:  Pneumonia.  Obstructive sleep apnea.  Blockage of an artery in the lungs (pulmonary embolism, or PE).  Lung cancer.  Chronic lung disease.  Thyroid problems, especially if the thyroid is overactive (hyperthyroidism).  Caffeine.  Excessive alcohol use or illegal drug use.  Use of some medicines, including certain decongestants and diet pills.  Sometimes, the cause cannot be found. What increases the risk? This condition is more likely to develop in:  People who are older in age.  People who smoke.  People who have diabetes mellitus.  People who are overweight  (obese).  Athletes who exercise vigorously.  What are the signs or symptoms? Symptoms of this condition include:  A feeling that your heart is beating rapidly or irregularly.  A feeling of discomfort or pain in your chest.  Shortness of breath.  Sudden light-headedness or weakness.  Getting tired easily during exercise.  In some cases, there are no symptoms. How is this diagnosed? Your health care provider may be able to detect atrial fibrillation when taking your pulse. If detected, this condition may be diagnosed with:  An electrocardiogram (ECG).  A Holter monitor test that records your heartbeat patterns over a 24-hour period.  Transthoracic echocardiogram (TTE) to evaluate how blood flows through your heart.  Transesophageal echocardiogram (TEE) to view more detailed images of your heart.  A stress test.  Imaging tests, such as a CT scan or chest X-ray.  Blood tests.  How is this treated? The main goals of treatment are to prevent blood clots from forming and to keep your heart beating at a normal rate and rhythm. The type of treatment that you receive depends on many factors, such as your underlying medical conditions and how you feel when you are experiencing atrial fibrillation. This condition may be treated with:  Medicine to slow down the heart rate, bring the heart's rhythm back to normal, or prevent clots from forming.  Electrical cardioversion. This is a procedure that resets your heart's rhythm by delivering a controlled, low-energy shock to the heart through your skin.  Different types of ablation, such as catheter ablation, catheter ablation with pacemaker, or surgical ablation. These procedures destroy the heart tissues that send abnormal signals. When the pacemaker is used, it is placed under your skin to help your heart beat in  a regular rhythm.  Follow these instructions at home:  Take over-the counter and prescription medicines only as told by your  health care provider.  If your health care provider prescribed a blood-thinning medicine (anticoagulant), take it exactly as told. Taking too much blood-thinning medicine can cause bleeding. If you do not take enough blood-thinning medicine, you will not have the protection that you need against stroke and other problems.  Do not use tobacco products, including cigarettes, chewing tobacco, and e-cigarettes. If you need help quitting, ask your health care provider.  If you have obstructive sleep apnea, manage your condition as told by your health care provider.  Do not drink alcohol.  Do not drink beverages that contain caffeine, such as coffee, soda, and tea.  Maintain a healthy weight. Do not use diet pills unless your health care provider approves. Diet pills may make heart problems worse.  Follow diet instructions as told by your health care provider.  Exercise regularly as told by your health care provider.  Keep all follow-up visits as told by your health care provider. This is important. How is this prevented?  Avoid drinking beverages that contain caffeine or alcohol.  Avoid certain medicines, especially medicines that are used for breathing problems.  Avoid certain herbs and herbal medicines, such as those that contain ephedra or ginseng.  Do not use illegal drugs, such as cocaine and amphetamines.  Do not smoke.  Manage your high blood pressure. Contact a health care provider if:  You notice a change in the rate, rhythm, or strength of your heartbeat.  You are taking an anticoagulant and you notice increased bruising.  You tire more easily when you exercise or exert yourself. Get help right away if:  You have chest pain, abdominal pain, sweating, or weakness.  You feel nauseous.  You notice blood in your vomit, bowel movement, or urine.  You have shortness of breath.  You suddenly have swollen feet and ankles.  You feel dizzy.  You have sudden weakness or  numbness of the face, arm, or leg, especially on one side of the body.  You have trouble speaking, trouble understanding, or both (aphasia).  Your face or your eyelid droops on one side. These symptoms may represent a serious problem that is an emergency. Do not wait to see if the symptoms will go away. Get medical help right away. Call your local emergency services (911 in the [place] Do not drive yourself to the hospital. This information is not intended to replace advice given to you by your health care provider. Make sure you discuss any questions you have with your health care provider. Document Released: 06/27/2005 Document Revised: 11/04/2015 Document Reviewed: 10/22/2014 Elsevier Interactive Patient Education  2018 [employer]. ++++++++++++++++++++++++++++++++ Preventing Atrial Fibrillation-Related Stroke Atrial fibrillation is a common type of irregular or rapid heartbeat (arrhythmia) that greatly increases your risk for a stroke. In atrial fibrillation, the top portions of the heart (atria) beat out of sync with the lower portions of the heart. When the muscles of the atria are tightening in an uncoordinated way (fibrillating), blood can pool in the heart and form clots. If a clot travels to the brain, it can cause a stroke. This type of stroke is preventable. Understanding atrial fibrillation and knowing how to properly manage it can prevent you from having a stroke. What increases my risk for a stroke? If you have atrial fibrillation, you may be at increased risk for a stroke if you also:  Have heart failure.  Have high blood pressure.  Are older than age 40.  Have diabetes.  Have a history of vascular disease, such as heart attack or stroke.  Are male.  If you have atrial fibrillation and you also have one or more of those risk factors, talk with your health care provider about treatments that can prevent a stroke. Other risk factors for a stroke  include:  Smoking.  High cholesterol.  Diabetes.  Being inactive (sedentary lifestyle).  Having a family history of stroke.  Eating a diet that is high in fat, cholesterol, and salt.  What treatments help to manage atrial fibrillation? The main goals of treatment for atrial fibrillation are to prevent blood clots from forming and to keep your heart beating at a normal rate and rhythm. Treatment may include:  Blood-thinning medicine (anticoagulant) that helps to prevent clots from forming. This medicine also increases the risk of bleeding. Talk with your health care provider about the risks and benefits of taking anticoagulants.  Medicine that slows the heart rate or brings the heart rhythm back to normal.  Electrical cardioversion. This is a procedure that resets the heart's rhythm by delivering a controlled, low-energy shock through your skin to your heart.  An ablation procedure, such as catheter ablation, catheter ablation with pacemaker, or surgical ablation. These procedures destroy the heart tissues that send abnormal signals so that heart rhythms can be improved or made normal. A pacemaker is a device that is placed under the skin to help the heart beat in a regular rhythm.  How can I prevent atrial fibrillation-related stroke? Medicines  Take over-the-counter and prescription medicines only as told by your health care provider.  If your health care provider prescribed an anticoagulant, take it exactly as told. Taking too much blood-thinning medicine can cause bleeding. If you do not take enough blood-thinning medicine, you will not have the protection that you need against stroke and other problems. Eating and drinking  Eat healthy foods, including at least 5 servings of fruits and vegetables a day.  Do not drink alcohol.  Do not drink beverages that contain caffeine, such as coffee, soda, and tea.  Follow dietary instructions as told by your health care  provider. Managing other medical conditions  Manage and be aware of your blood pressure. If you have high blood pressure, follow your treatment plan to keep it in your target range.  Have your cholesterol checked as often as recommended by your health care provider. If you have high cholesterol, follow your treatment plan to lower it and keep it in your target range.  Talk with your health care provider about symptoms to watch for. Some people may not have any symptoms, so it can be hard to know that they have atrial fibrillation. Talk with your health care provider if you experience: ? A feeling that your heart is beating rapidly or irregularly. ? An irregular pulse. ? A feeling of discomfort or pain in your chest. ? Shortness of breath. ? Sudden light-headedness or weakness. ? Tiredness (fatigue) that happens easily during exercise.  If you have obstructive sleep apnea (OSA), manage your condition as told by your health care provider. General instructions  Maintain a healthy weight. Do not use diet pills unless your health care provider approves. Diet pills may make heart problems worse.  Exercise regularly. Get at least 30 minutes of activity on most or all days, or as told by your health care provider.  Do not use any products that contain nicotine or  tobacco, such as cigarettes and e-cigarettes. If you need help quitting, ask your health care provider.  Do not use drugs, such as cocaine and amphetamines.  Keep all follow-up visits as told by your health care providers. This is important. These include visits with your heart specialist. Where to find more information: You may find more information about preventing atrial fibrillation-related stroke from:  National Stroke Association (AFib-Stroke Connection): www.stroke.org  Contact a health care provider if:  You notice a change in the rate, rhythm, or strength of your heartbeat.  You have dizziness.  You are taking an  anticoagulant and you have more bruises than usual.  You tire out more easily when you exercise or do similar activities. Get help right away if:  You have chest pain.  You have pain in your abdomen.  You experience unusual sweating or weakness.  You take anticoagulants and you: ? Have severe headaches or confusion. ? Have blood in your vomit, bowel movement, or urine. ? Have bleeding that will not stop. ? Fall or injure your head.  You have any symptoms of a stroke. "BE FAST" is an easy way to remember the main warning signs of a stroke: ? B - Balance. Signs are dizziness, trouble walking, or loss of balance. ? E - Eyes. Signs are trouble seeing or a sudden change in vision. ? F - Face. Signs are sudden weakness or numbness of the face, or the face or eyelid drooping on one side. ? A - Arms. Signs are weakness or numbness in an arm. This happens suddenly and usually on one side of the body. ? S - Speech. Signs are sudden trouble speaking, slurred speech, or trouble understanding what people say. ? T - Time. Time to call emergency services. Write down what time symptoms started.  You have other signs of a stroke, such as: ? A sudden, severe headache with no known cause. ? Nausea or vomiting. ? Seizure. These symptoms may represent a serious problem that is an emergency. Do not wait to see if the symptoms will go away. Get medical help right away. Call your local emergency services (911 in the [place] Do not drive yourself to the hospital. Summary  Having atrial fibrillation increases the risk for a stroke. Talk with your health care provider about what symptoms to watch for.  Atrial fibrillation-related stroke is preventable. Proper management of atrial fibrillation can prevent you from having a stroke.  Talk with your health care provider about whether anticoagulant medicine is right for you.  Learn the warning signs of a stroke and remember "BE FAST." This information is not  intended to replace advice given to you by your health care provider. Make sure you discuss any questions you have with your health care provider. Document Released: 10/12/2016 Document Revised: 10/12/2016 Document Reviewed: 10/12/2016 Elsevier Interactive Patient Education  2018 [place].  ++++++++++++++++++++++++++ Recommend Adult Low Dose Aspirin or  coated  Aspirin 81 mg daily  To reduce risk of Colon Cancer 20 %,  Skin Cancer 26 % ,  Melanoma 46%  and  Pancreatic cancer 60% +++++++++++++++++++++++++ Vitamin D goal  is between 70-100.  Please make sure that you are taking your Vitamin D as directed.  It is very important as a natural anti-inflammatory  helping hair, skin, and nails, as well as reducing stroke and heart attack risk.  It helps your bones and helps with mood. It also decreases numerous cancer risks so please take it as directed.  Low Vit  D is associated with a 200-300% higher risk for CANCER  and 200-300% higher risk for HEART   ATTACK  &  STROKE.   .....................................Marland Kitchen It is also associated with higher death rate at younger ages,  autoimmune diseases like Rheumatoid arthritis, Lupus, Multiple Sclerosis.    Also many other serious conditions, like depression, Alzheimer's Dementia, infertility, muscle aches, fatigue, fibromyalgia - just to name a few. ++++++++++++++++++++ Recommend the book "The END of DIETING" by Dr Excell Seltzer  & the book "The END of DIABETES " by Dr Excell Seltzer At [email or web address] - get book & Audio CD's    Being diabetic has a  300% increased risk for heart attack, stroke, cancer, and alzheimer- type vascular dementia. It is very important that you work harder with diet by avoiding all foods that are white. Avoid white rice (brown & wild rice is OK), white potatoes (sweetpotatoes in moderation is OK), White bread or wheat bread or anything made out of white flour like bagels, donuts, rolls, buns, biscuits, cakes, pastries,  cookies, pizza crust, and pasta (made from white flour & egg whites) - vegetarian pasta or spinach or wheat pasta is OK. Multigrain breads like Arnold's or Pepperidge Farm, or multigrain sandwich thins or flatbreads.  Diet, exercise and weight loss can reverse and cure diabetes in the early stages.  Diet, exercise and weight loss is very important in the control and prevention of complications of diabetes which affects every system in your body, ie. Brain - dementia/stroke, eyes - glaucoma/blindness, heart - heart attack/heart failure, kidneys - dialysis, stomach - gastric paralysis, intestines - malabsorption, nerves - severe painful neuritis, circulation - gangrene & loss of a leg(s), and finally cancer and Alzheimers.    I recommend avoid fried & greasy foods,  sweets/candy, white rice (brown or wild rice or Quinoa is OK), white potatoes (sweet potatoes are OK) - anything made from white flour - bagels, doughnuts, rolls, buns, biscuits,white and wheat breads, pizza crust and traditional pasta made of white flour & egg white(vegetarian pasta or spinach or wheat pasta is OK).  Multi-grain bread is OK - like multi-grain flat bread or sandwich thins. Avoid alcohol in excess. Exercise is also important.    Eat all the vegetables you want - avoid meat, especially red meat and dairy - especially cheese.  Cheese is the most concentrated form of trans-fats which is the worst thing to clog up our arteries. Veggie cheese is OK which can be found in the fresh produce section at [employer] or [employer] or [employer]  +++++++++++++++++++++ DASH Eating Plan  DASH stands for "Dietary Approaches to Stop Hypertension."   The DASH eating plan is a healthy eating plan that has been shown to reduce high blood pressure (hypertension). Additional health benefits may include reducing the risk of type 2 diabetes mellitus, heart disease, and stroke. The DASH eating plan may also help with weight loss. WHAT DO I NEED TO  KNOW ABOUT THE DASH EATING PLAN? For the DASH eating plan, you will follow these general guidelines:  Choose foods with a percent daily value for sodium of less than 5% (as listed on the food label).  Use salt-free seasonings or herbs instead of table salt or sea salt.  Check with your health care provider or pharmacist before using salt substitutes.  Eat lower-sodium products, often labeled as "lower sodium" or "no salt added."  Eat fresh foods.  Eat more vegetables, fruits, and low-fat dairy products.  Choose whole grains. Look for the  word "whole" as the first word in the ingredient list.  Choose fish   Limit sweets, desserts, sugars, and sugary drinks.  Choose heart-healthy fats.  Eat veggie cheese   Eat more home-cooked food and less restaurant, buffet, and fast food.  Limit fried foods.  Cook foods using methods other than frying.  Limit canned vegetables. If you do use them, rinse them well to decrease the sodium.  When eating at a restaurant, ask that your food be prepared with less salt, or no salt if possible.                      WHAT FOODS CAN I EAT? Read Dr Fara Olden Fuhrman's books on The End of Dieting & The End of Diabetes  Grains Whole grain or whole wheat bread. Brown rice. Whole grain or whole wheat pasta. Quinoa, bulgur, and whole grain cereals. Low-sodium cereals. Corn or whole wheat flour tortillas. Whole grain cornbread. Whole grain crackers. Low-sodium crackers.  Vegetables Fresh or frozen vegetables (raw, steamed, roasted, or grilled). Low-sodium or reduced-sodium tomato and vegetable juices. Low-sodium or reduced-sodium tomato sauce and paste. Low-sodium or reduced-sodium canned vegetables.   Fruits All fresh, canned (in natural juice), or frozen fruits.  Protein Products  All fish and seafood.  Dried beans, peas, or lentils. Unsalted nuts and seeds. Unsalted canned beans.  Dairy Low-fat dairy products, such as skim or 1% milk, 2% or reduced-fat  cheeses, low-fat ricotta or cottage cheese, or plain low-fat yogurt. Low-sodium or reduced-sodium cheeses.  Fats and Oils Tub margarines without trans fats. Light or reduced-fat mayonnaise and salad dressings (reduced sodium). Avocado. Safflower, olive, or canola oils. Natural peanut or almond butter.  Other Unsalted popcorn and pretzels. The items listed above may not be a complete list of recommended foods or beverages. Contact your dietitian for more options.  +++++++++++++++  WHAT FOODS ARE NOT RECOMMENDED? Grains/ White flour or wheat flour White bread. White pasta. White rice. Refined cornbread. Bagels and croissants. Crackers that contain trans fat.  Vegetables  Creamed or fried vegetables. Vegetables in a . Regular canned vegetables. Regular canned tomato sauce and paste. Regular tomato and vegetable juices.  Fruits Dried fruits. Canned fruit in light or heavy syrup. Fruit juice.  Meat and Other Protein Products Meat in general - RED meat & White meat.  Fatty cuts of meat. Ribs, chicken wings, all processed meats as bacon, sausage, bologna, salami, fatback, hot dogs, bratwurst and packaged luncheon meats.  Dairy Whole or 2% milk, cream, half-and-half, and cream cheese. Whole-fat or sweetened yogurt. Full-fat cheeses or blue cheese. Non-dairy creamers and whipped toppings. Processed cheese, cheese spreads, or cheese curds.  Condiments Onion and garlic salt, seasoned salt, table salt, and sea salt. Canned and packaged gravies. Worcestershire sauce. Tartar sauce. Barbecue sauce. Teriyaki sauce. Soy sauce, including reduced sodium. Steak sauce. Fish sauce. Oyster sauce. Cocktail sauce. Horseradish. Ketchup and mustard. Meat flavorings and tenderizers. Bouillon cubes. Hot sauce. Tabasco sauce. Marinades. Taco seasonings. Relishes.  Fats and Oils Butter, stick margarine, lard, shortening and bacon fat. Coconut, palm kernel, or palm oils. Regular salad dressings.  Pickles and  olives. Salted popcorn and pretzels.  The items listed above may not be a complete list of foods and beverages to avoid.

## 2017-09-13 ENCOUNTER — Encounter: Payer: Self-pay | Admitting: Internal Medicine

## 2017-09-13 LAB — CBC WITH DIFFERENTIAL/PLATELET
BASOS ABS: 63 {cells}/uL (ref 0–200)
Basophils Relative: 0.8 %
EOS ABS: 253 {cells}/uL (ref 15–500)
Eosinophils Relative: 3.2 %
HEMATOCRIT: 47.6 % (ref 38.5–50.0)
Hemoglobin: 16.2 g/dL (ref 13.2–17.1)
LYMPHS ABS: 1888 {cells}/uL (ref 850–3900)
MCH: 31.1 pg (ref 27.0–33.0)
MCHC: 34 g/dL (ref 32.0–36.0)
MCV: 91.4 fL (ref 80.0–100.0)
MPV: 10.2 fL (ref 7.5–12.5)
Monocytes Relative: 8.1 %
Neutro Abs: 5056 cells/uL (ref 1500–7800)
Neutrophils Relative %: 64 %
Platelets: 234 10*3/uL (ref 140–400)
RBC: 5.21 10*6/uL (ref 4.20–5.80)
RDW: 12.8 % (ref 11.0–15.0)
Total Lymphocyte: 23.9 %
WBC mixed population: 640 cells/uL (ref 200–950)
WBC: 7.9 10*3/uL (ref 3.8–10.8)

## 2017-09-13 LAB — TSH: TSH: 0.68 mIU/L (ref 0.40–4.50)

## 2017-09-13 LAB — HEMOGLOBIN A1C
Hgb A1c MFr Bld: 5.4 % of total Hgb (ref <5.7)
MEAN PLASMA GLUCOSE: 108 (calc)
eAG (mmol/L): 6 (calc)

## 2017-09-13 LAB — BASIC METABOLIC PANEL WITH GFR
BUN: 14 mg/dL (ref 7–25)
CALCIUM: 9.8 mg/dL (ref 8.6–10.3)
CHLORIDE: 106 mmol/L (ref 98–110)
CO2: 28 mmol/L (ref 20–32)
Creat: 0.76 mg/dL (ref 0.70–1.18)
GFR, Est African American: 106 mL/min/{1.73_m2} (ref >=60)
GFR, Est Non African American: 92 mL/min/{1.73_m2} (ref >=60)
GLUCOSE: 84 mg/dL (ref 65–99)
Potassium: 4 mmol/L (ref 3.5–5.3)
Sodium: 142 mmol/L (ref 135–146)

## 2017-09-13 LAB — LIPID PANEL
CHOLESTEROL: 139 mg/dL (ref <200)
HDL: 41 mg/dL (ref >40)
LDL CHOLESTEROL (CALC): 70 mg/dL
Non-HDL Cholesterol (Calc): 98 mg/dL (calc) (ref <130)
TRIGLYCERIDES: 214 mg/dL — AB (ref <150)
Total CHOL/HDL Ratio: 3.4 (calc) (ref <5.0)

## 2017-09-13 LAB — HEPATIC FUNCTION PANEL
AG Ratio: 2.1 (calc) (ref 1.0–2.5)
ALBUMIN MSPROF: 4.6 g/dL (ref 3.6–5.1)
ALT: 30 U/L (ref 9–46)
AST: 24 U/L (ref 10–35)
Alkaline phosphatase (APISO): 73 U/L (ref 40–115)
BILIRUBIN INDIRECT: 0.3 mg/dL (ref 0.2–1.2)
Bilirubin, Direct: 0.1 mg/dL (ref 0.0–0.2)
Globulin: 2.2 g/dL (calc) (ref 1.9–3.7)
TOTAL PROTEIN: 6.8 g/dL (ref 6.1–8.1)
Total Bilirubin: 0.4 mg/dL (ref 0.2–1.2)

## 2017-09-13 LAB — MAGNESIUM: MAGNESIUM: 2 mg/dL (ref 1.5–2.5)

## 2017-09-13 LAB — INSULIN, RANDOM: Insulin: 8.1 u[IU]/mL (ref 2.0–19.6)

## 2017-09-13 LAB — VITAMIN D 25 HYDROXY (VIT D DEFICIENCY, FRACTURES): VIT D 25 HYDROXY: 71 ng/mL (ref 30–100)

## 2017-09-15 ENCOUNTER — Ambulatory Visit
Admission: RE | Admit: 2017-09-15 | Discharge: 2017-09-15 | Disposition: A | Discharge status: Home / Self Care | Payer: Medicare Other | Source: Ambulatory Visit | Attending: Internal Medicine | Admitting: Internal Medicine

## 2017-09-15 ENCOUNTER — Other Ambulatory Visit: Payer: Self-pay | Admitting: Internal Medicine

## 2017-09-15 DIAGNOSIS — R221 Localized swelling, mass and lump, neck: Secondary | ICD-10-CM

## 2017-09-15 DIAGNOSIS — C419 Malignant neoplasm of bone and articular cartilage, unspecified: Secondary | ICD-10-CM

## 2017-09-15 DIAGNOSIS — R222 Localized swelling, mass and lump, trunk: Secondary | ICD-10-CM

## 2017-09-18 ENCOUNTER — Ambulatory Visit (INDEPENDENT_AMBULATORY_CARE_PROVIDER_SITE_OTHER): Payer: Medicare Other | Admitting: *Deleted

## 2017-09-18 DIAGNOSIS — I480 Paroxysmal atrial fibrillation: Secondary | ICD-10-CM | POA: Diagnosis not present

## 2017-09-18 NOTE — Progress Notes (Signed)
[facility] Summary Report / Loop Recorder 

## 2017-09-20 DIAGNOSIS — H2513 Age-related nuclear cataract, bilateral: Secondary | ICD-10-CM | POA: Diagnosis not present

## 2017-09-20 DIAGNOSIS — H04123 Dry eye syndrome of bilateral lacrimal glands: Secondary | ICD-10-CM | POA: Diagnosis not present

## 2017-09-20 DIAGNOSIS — H5203 Hypermetropia, bilateral: Secondary | ICD-10-CM | POA: Diagnosis not present

## 2017-09-20 DIAGNOSIS — H0100A Unspecified blepharitis right eye, upper and lower eyelids: Secondary | ICD-10-CM | POA: Diagnosis not present

## 2017-10-10 DIAGNOSIS — M5030 Other cervical disc degeneration, unspecified cervical region: Secondary | ICD-10-CM | POA: Diagnosis not present

## 2017-10-10 DIAGNOSIS — M5412 Radiculopathy, cervical region: Secondary | ICD-10-CM | POA: Diagnosis not present

## 2017-10-10 DIAGNOSIS — M5020 Other cervical disc displacement, unspecified cervical region: Secondary | ICD-10-CM | POA: Diagnosis not present

## 2017-10-10 DIAGNOSIS — M4722 Other spondylosis with radiculopathy, cervical region: Secondary | ICD-10-CM | POA: Diagnosis not present

## 2017-10-10 DIAGNOSIS — Z6831 Body mass index (BMI) 31.0-31.9, adult: Secondary | ICD-10-CM | POA: Diagnosis not present

## 2017-10-10 DIAGNOSIS — I10 Essential (primary) hypertension: Secondary | ICD-10-CM | POA: Diagnosis not present

## 2017-10-10 DIAGNOSIS — M542 Cervicalgia: Secondary | ICD-10-CM | POA: Diagnosis not present

## 2017-10-11 ENCOUNTER — Encounter: Payer: Self-pay | Admitting: Internal Medicine

## 2017-10-11 ENCOUNTER — Ambulatory Visit
Admission: RE | Admit: 2017-10-11 | Discharge: 2017-10-11 | Disposition: A | Discharge status: Home / Self Care | Payer: Medicare Other | Source: Ambulatory Visit | Attending: Internal Medicine | Admitting: Internal Medicine

## 2017-10-11 DIAGNOSIS — R222 Localized swelling, mass and lump, trunk: Secondary | ICD-10-CM | POA: Diagnosis not present

## 2017-10-11 DIAGNOSIS — I700 Atherosclerosis of aorta: Secondary | ICD-10-CM | POA: Insufficient documentation

## 2017-10-11 MED ORDER — IOPAMIDOL (ISOVUE-300) INJECTION 61%
75.0000 mL | Freq: Once | INTRAVENOUS | Status: AC | PRN
  Administered 2017-10-11: 75 mL via INTRAVENOUS

## 2017-10-20 ENCOUNTER — Ambulatory Visit (INDEPENDENT_AMBULATORY_CARE_PROVIDER_SITE_OTHER): Payer: Medicare Other | Admitting: *Deleted

## 2017-10-20 DIAGNOSIS — I480 Paroxysmal atrial fibrillation: Secondary | ICD-10-CM

## 2017-10-23 NOTE — Progress Notes (Signed)
[facility] Summary Report / Loop Recorder 

## 2017-10-26 LAB — CUP PACEART REMOTE DEVICE CHECK
Date Time Interrogation Session: 20190310233919
Implantable Pulse Generator Implant Date: 20180312

## 2017-11-02 ENCOUNTER — Encounter: Payer: Self-pay | Admitting: Internal Medicine

## 2017-11-08 ENCOUNTER — Encounter: Payer: Self-pay | Admitting: Gastroenterology

## 2017-11-13 ENCOUNTER — Other Ambulatory Visit: Payer: Self-pay | Admitting: Internal Medicine

## 2017-11-13 NOTE — Telephone Encounter (Signed)
Age 72 years Wt 107.4kg 09/12/2017 09/12/2017 SrCr 0.76 Hgb 16.2 HCT 47.6 Saw Dr Allred  05/24/2017  Saw Roderic Palau 06/30/2017 and has an appt to see Dr Rayann Heman on 11/20/2017  Refill done for Eliquis 5mg  q 12 hours as requested

## 2017-11-20 ENCOUNTER — Encounter: Payer: Self-pay | Admitting: Internal Medicine

## 2017-11-20 ENCOUNTER — Ambulatory Visit (INDEPENDENT_AMBULATORY_CARE_PROVIDER_SITE_OTHER): Payer: Medicare Other | Admitting: Internal Medicine

## 2017-11-20 VITALS — BP 126/74 | HR 79 | Ht 72.0 in | Wt 242.0 lb

## 2017-11-20 DIAGNOSIS — I10 Essential (primary) hypertension: Secondary | ICD-10-CM

## 2017-11-20 DIAGNOSIS — I480 Paroxysmal atrial fibrillation: Secondary | ICD-10-CM

## 2017-11-20 LAB — CUP PACEART INCLINIC DEVICE CHECK
Date Time Interrogation Session: 20190513164949
MDC IDC PG IMPLANT DT: 20180312

## 2017-11-20 NOTE — Progress Notes (Signed)
PCP: Unk Pinto, MD   Primary EP: [name] is a 72 y.o. male who presents today for routine electrophysiology followup.  Since last being seen in our clinic, the patient reports doing very well.  Today, he denies symptoms of palpitations, chest pain, shortness of breath,  lower extremity edema, dizziness, presyncope, or syncope.  The patient is otherwise without complaint today.   Past Medical History:  Diagnosis Date  . Arthritis   . Benign prostatic hyperplasia   . BMI 33.0-33.9,adult 05/21/2015  . BPH (benign prostatic hypertrophy)   . Frequency of urination   . History of SCC (squamous cell carcinoma) of skin 10/04/2016  . Hyperlipidemia   . Hypertension   . IBS (irritable bowel syndrome)   . Mild acid reflux occasional  . Nocturia   . Obesity   . Other abnormal glucose   . Pelvic pain in male   . Pre-diabetes   . Urgency of urination   . Vitamin D deficiency   . Wears glasses    Past Surgical History:  Procedure Laterality Date  . ANTERIOR CERVICAL DECOMP/DISCECTOMY FUSION  01-25-2002   C4 - C5  . ANTERIOR CRUCIATE LIGAMENT REPAIR  1990   RIGHT  . APPENDECTOMY  02-07-2007  . CERVICAL FUSION  2000   C5 - 6  . COLONOSCOPY  05-30-2011   HEMORRHOIDS  . HIP SURGERY  1999   LEFT HIP --  REMOVAL CALCIUM BUILD-UP  . LEFT ANKLE SURG.  1980  . LEFT INGUINAL HERNIA REPAIR  1970  . LEFT ROTATOR CUFF REPAIR  2002  . LOOP RECORDER INSERTION N/A 09/19/2016   Procedure: Loop Recorder Insertion;  Surgeon: Johnathan Grayer, MD;  Location: [facility];  Service: Cardiovascular;  Laterality: N/A;  . TENDON RECONSTRUCTION Left 07/17/2013   Procedure: LEFT ELBOW LATERAL RECONSTRUCTION;  Surgeon: Schuyler Amor, MD;  Location: [place];  Service: Orthopedics;  Laterality: Left;  . TONSILLECTOMY    . TRIGGER FINGER RELEASE Right 07/17/2013   Procedure: RELEASE A-1 PULLEY RIGHT RING FINGER;  Surgeon: Schuyler Amor, MD;  Location: [place];  Service: Orthopedics;  Laterality: Right;  . UMBILICAL HERNIA REPAIR  2003    ROS- all systems are reviewed and negatives except as per HPI above  Current Outpatient Medications  Medication Sig Dispense Refill  . ALPRAZolam (XANAX) 1 MG tablet Taken 1/2  to 1 tablet by mouth 3 times a day if needed for anxiety    . Carboxymethylcellulose Sodium (REFRESH TEARS OP) Apply 1 drop to eye daily as needed (dry eyes).    . Cholecalciferol (VITAMIN D3) 5000 units CAPS Take 1 capsule daily by mouth.    . diclofenac sodium (VOLTAREN) 1 % GEL Apply topically 4 (four) times daily.    Marland Kitchen ELIQUIS 5 MG TABS tablet TAKE 1 TABLET BY MOUTH TWO  TIMES DAILY 180 tablet 1  . finasteride (PROSCAR) 5 MG tablet Take 5 mg by mouth daily.    . Flaxseed, Linseed, (FLAX SEED OIL PO) Take 1 capsule by mouth daily.    Marland Kitchen losartan (COZAAR) 50 MG tablet Take 25 mg by mouth daily.    . Multiple Vitamin (MULTIVITAMIN) tablet Take 1 tablet by mouth daily.    . Omega-3 Fatty Acids (FISH OIL PO) Take 1 capsule by mouth 2 (two) times daily.    Marland Kitchen RAPAFLO 8 MG CAPS capsule Take 8 mg by mouth as needed (Take as directed for urinary rentention).     Marland Kitchen  rosuvastatin (CRESTOR) 40 MG tablet Take 20 mg by mouth daily.     . tadalafil (CIALIS) 20 MG tablet Take 1/2 to 1 tablet by mouth daily or as directed    . verapamil (CALAN-SR) 120 MG CR tablet TAKE 1 TABLET BY MOUTH  DAILY 90 tablet 1  . vitamin C (ASCORBIC ACID) 500 MG tablet Take 500 mg by mouth daily.     No current facility-administered medications for this visit.     Physical Exam: Vitals:   11/20/17 1424  BP: 126/74  Pulse: 79  SpO2: 97%  Weight: 242 lb (109.8 kg)  Height: 6' (1.829 m)    GEN- The patient is well appearing, alert and oriented x 3 today.   Head- normocephalic, atraumatic Eyes-  Sclera clear, conjunctiva pink Ears- hearing intact Oropharynx- clear Lungs- Clear to ausculation bilaterally, normal work of breathing Heart- Regular  rate and rhythm, no murmurs, rubs or gallops, PMI not laterally displaced GI- soft, NT, ND, + BS Extremities- no clubbing, cyanosis, or edema   Assessment and Plan:  1. Paroxysmal atrial fibrillation afib burden today is  0.2 % (0.5% last visit) by ILR interrogation today (personally reviewed) On eliquis for chads2vasc score of 2  2. HTN Stable No change required today  carelink Return in a year  Johnathan Grayer MD, [facility] 11/20/2017 2:41 PM

## 2017-11-20 NOTE — Patient Instructions (Signed)

## 2017-11-22 ENCOUNTER — Ambulatory Visit (INDEPENDENT_AMBULATORY_CARE_PROVIDER_SITE_OTHER): Payer: Medicare Other | Admitting: *Deleted

## 2017-11-22 DIAGNOSIS — I480 Paroxysmal atrial fibrillation: Secondary | ICD-10-CM | POA: Diagnosis not present

## 2017-11-22 LAB — CUP PACEART REMOTE DEVICE CHECK
Implantable Pulse Generator Implant Date: 20180312
MDC IDC SESS DTM: 20190412234052

## 2017-11-23 NOTE — Progress Notes (Signed)
[facility] Summary Report / Loop Recorder 

## 2017-11-28 ENCOUNTER — Other Ambulatory Visit: Payer: Self-pay | Admitting: *Deleted

## 2017-11-28 MED ORDER — ROSUVASTATIN CALCIUM 40 MG PO TABS: ORAL_TABLET | ORAL | 3 refills | Status: DC

## 2017-11-28 MED ORDER — TADALAFIL 20 MG PO TABS: 20.0000 mg | ORAL_TABLET | Freq: Every day | ORAL | 3 refills | Status: DC | PRN

## 2017-11-29 ENCOUNTER — Telehealth (HOSPITAL_COMMUNITY): Payer: Self-pay | Admitting: *Deleted

## 2017-11-29 NOTE — Telephone Encounter (Signed)
Pt called in with recurrent nose bleeds. He can get it stop but will rebleed with the next dose of eliquis. Started on Friday ([place] the beach) and has been on and off since. Instructed pt he can hold eliquis for no more than 48 hours use saline spray and if once he resumes eliquis the bleeding returns he should see ENT for further assessment. Pt in agreement with plan.

## 2017-12-15 LAB — CUP PACEART REMOTE DEVICE CHECK
Date Time Interrogation Session: 20190516000617
MDC IDC PG IMPLANT DT: 20180312

## 2017-12-25 ENCOUNTER — Ambulatory Visit (INDEPENDENT_AMBULATORY_CARE_PROVIDER_SITE_OTHER): Payer: Medicare Other | Admitting: *Deleted

## 2017-12-25 DIAGNOSIS — I480 Paroxysmal atrial fibrillation: Secondary | ICD-10-CM | POA: Diagnosis not present

## 2017-12-25 DIAGNOSIS — M1711 Unilateral primary osteoarthritis, right knee: Secondary | ICD-10-CM | POA: Diagnosis not present

## 2017-12-26 NOTE — Progress Notes (Signed)
[facility] Summary Report / Loop Recorder 

## 2017-12-29 ENCOUNTER — Ambulatory Visit: Payer: Medicare Other | Admitting: Gastroenterology

## 2018-01-03 ENCOUNTER — Ambulatory Visit: Payer: Self-pay | Admitting: Adult Health

## 2018-01-05 ENCOUNTER — Ambulatory Visit: Payer: Medicare Other | Admitting: Gastroenterology

## 2018-01-15 ENCOUNTER — Telehealth: Payer: Self-pay

## 2018-01-15 DIAGNOSIS — M1711 Unilateral primary osteoarthritis, right knee: Secondary | ICD-10-CM | POA: Diagnosis not present

## 2018-01-15 NOTE — Telephone Encounter (Signed)
   [facility] Pre-operative Risk Assessment    Request for surgical clearance:  1. What type of surgery is being performed? Right total knee replacement    2. When is this surgery scheduled? pending   3. What type of clearance is required (medical clearance vs. Pharmacy clearance to hold med vs. Both)? Both   4. Are there any medications that need to be held prior to surgery and how long? Eliquis, please advise   5. Practice name and name of physician performing surgery? [facility], Dr. Noemi Chapel   6. What is your office phone number [phone] ext [phone], attn Sherri    7.   What is your office fax number [phone] attn Sherri  8.   Anesthesia type (None, local, MAC, general) ? Not listed    [name] 01/15/2018, 5:06 PM  _________________________________________________________________   (provider comments below)

## 2018-01-16 ENCOUNTER — Telehealth (HOSPITAL_COMMUNITY): Payer: Self-pay | Admitting: *Deleted

## 2018-01-16 DIAGNOSIS — M1812 Unilateral primary osteoarthritis of first carpometacarpal joint, left hand: Secondary | ICD-10-CM | POA: Diagnosis not present

## 2018-01-16 NOTE — Telephone Encounter (Signed)
Patient called wanted documented that Dr. Noemi Chapel started patient on a prednisone pack and he will need a knee replacement in September.

## 2018-01-17 ENCOUNTER — Telehealth: Payer: Self-pay | Admitting: *Deleted

## 2018-01-17 NOTE — Telephone Encounter (Signed)
   [facility] Pre-operative Risk Assessment    Request for surgical clearance:  1. What type of surgery is being performed? LT L4-5 and LT L5-S1 TF ESI  2. When is this surgery scheduled? Pending Clearance  3. What type of clearance is required (medical clearance vs. Pharmacy clearance to hold med vs. Both)? Both  4. Are there any medications that need to be held prior to surgery and how long? Possibly Eliquis  5. Practice name and name of physician performing surgery? [facility], Dr. Laroy Apple  6. What is your office phone number [phone] Ext [phone]   7.   What is your office fax number [phone] Atten: X-ray  8.   Anesthesia type (None, local, MAC, general) ? Not mentioned     Marlis Edelson 01/17/2018, 5:34 PM  _________________________________________________________________   (provider comments below)

## 2018-01-18 NOTE — Telephone Encounter (Signed)
Pt takes Eliquis for afib with CHADS2VASc score of 2 (age, HTN). Renal function is normal. Ok to hold Eliquis for 3 days prior to procedure per protocol.

## 2018-01-19 NOTE — Telephone Encounter (Signed)
   Primary Cardiologist: Thompson Grayer, MD  Chart reviewed as part of pre-operative protocol coverage. Patient was contacted 01/19/2018 in reference to pre-operative risk assessment for pending surgery as outlined below.  [name] was last seen on 11/20/17 by Dr. Rayann Heman.  Since that day, [name] has done well with no new cardiac symptoms. We see him for afib and this is well controlled.  Therefore, based on ACC/AHA guidelines, the patient would be at acceptable risk for the planned procedure without further cardiovascular testing.   According to our pharmacy protocol: Pt takes Eliquis for afib with CHADS2VASc score of 2 (age, HTN). Renal function is normal. Ok to hold Eliquis for 3 days prior to procedure per protocol.  I will route this recommendation to the requesting party via Epic fax function and remove from pre-op pool.  Please call with questions.  Daune Perch, NP 01/19/2018, 9:33 AM

## 2018-01-19 NOTE — Telephone Encounter (Signed)
Patient with diagnosis of A Fib on Eliquis for anticoagulation.    Procedure: LT L4-5 and LT L5-S1 TF ESI Date of procedure: TBD  CHADS2-VASc score of  2 (HTN, AGE)  CrCl > 90 ml/min   Per office protocol, patient can hold ELIQUIS for 3 days prior to procedure.    Patient should restart Eliquis day after procedure , at discretion of procedure MD

## 2018-01-22 ENCOUNTER — Other Ambulatory Visit: Payer: Self-pay | Admitting: Internal Medicine

## 2018-01-24 ENCOUNTER — Telehealth: Payer: Self-pay

## 2018-01-24 NOTE — Telephone Encounter (Signed)
   [facility] Pre-operative Risk Assessment    Request for surgical clearance:  1. What type of surgery is being performed? LT L4-5 and Lt L5-S1   2. When is this surgery scheduled? TBD   3. What type of clearance is required (medical clearance vs. Pharmacy clearance to hold med vs. Both)? Medical  4. Are there any medications that need to be held prior to surgery and how long?   5. Practice name and name of physician performing surgery? [facility] Dr Ron Agee   6. What is your office phone [phone]    7.   What is your office fax [phone]  8.   Anesthesia type (None, local, MAC, general) ?    Legrand Como  Lacresha Fusilier 01/24/2018, 1:49 PM  _________________________________________________________________   (provider comments below)

## 2018-01-25 NOTE — Telephone Encounter (Signed)
   Primary Cardiologist: Thompson Grayer, MD  Chart reviewed as part of pre-operative protocol coverage. Patient was contacted 01/25/2018 in reference to pre-operative risk assessment for pending surgery as outlined below.  Pt will not be cleared for Lumbar surgery until we know how he tolerates his total knee surgery on 03/19/18.    Pre-op covering staff: - Please schedule appointment and call patient to inform them. - Please contact requesting surgeon's office via preferred method (i.e, phone, fax) to inform them of need for appointment prior to surgery.  Cecilie Kicks, NP 01/25/2018, 4:35 PM

## 2018-01-26 LAB — CUP PACEART REMOTE DEVICE CHECK
Implantable Pulse Generator Implant Date: 20180312
MDC IDC SESS DTM: 20190618003647

## 2018-01-29 ENCOUNTER — Ambulatory Visit (INDEPENDENT_AMBULATORY_CARE_PROVIDER_SITE_OTHER): Payer: Medicare Other | Admitting: *Deleted

## 2018-01-29 DIAGNOSIS — I480 Paroxysmal atrial fibrillation: Secondary | ICD-10-CM

## 2018-01-29 NOTE — Telephone Encounter (Signed)
I tried to reach pt to schedule an appt per Pre Op Protocol. I lmptcb. Primary Card is Dr. Rayann Heman. I s/w Sherry at Dr. Ron Agee office to update the pt will need an appt with our office before he can be cleared for lumbar surgery.  Judeen Hammans thanked me for the call. I advised once pt has been seen and has been cleared we will let their office know.

## 2018-01-29 NOTE — Progress Notes (Signed)
[facility] Summary Report / Loop Recorder 

## 2018-01-30 ENCOUNTER — Encounter: Payer: Self-pay | Admitting: Internal Medicine

## 2018-01-30 ENCOUNTER — Encounter: Payer: Self-pay | Admitting: Adult Health

## 2018-01-30 NOTE — Telephone Encounter (Signed)
I s/w pt today in regards to his surgery clearance needed for Lumbar surgery. Pt states he is not having Lumbar Surgery he is having an injection on 03/01/18 and was advised already he needs to hold Eliquis 02/26/18. I did ask the pt who advised him to hold Eliquis beginning 8/19 for the Lumbar injection. Pt answered our office did. I explained that I did not see any notes for that information.   I advised the pt of the recommendation per Cecilie Kicks, PA that pt would need an appt before he could be cleared. Pt was just seen by Dr. Rayann Heman 11/2017. I advised pt that I will need to d/w provider based on the new information if he will still need an appt or if he can be cleared w/o appt. Advised pt I will call back later today. Pt thanked for my help. Pt does state he has s/w our office x 3 already about this.   Pt states he has already been advised in regards to his knee surgery on 03/19/18 that he will need to hold the Eliquis beginning 03/15/18.

## 2018-01-30 NOTE — Telephone Encounter (Signed)
No further evaluation needed to clear for epidural steroid injection. I will forward to [facility] for recommendations regarding holding anticoagulation for his procedure. Richardson Dopp, PA-C    01/30/2018 5:01 PM

## 2018-01-30 NOTE — Progress Notes (Signed)
MEDICARE ANNUAL WELLNESS VISIT AND FOLLOW UP Assessment:   Encounter for annual wellness medicare visit  Essential hypertension - continue medications, DASH diet, exercise and monitor at home. Call if greater than 130/80.  -     CBC with Differential/Platelet -     CMP/GFR -     TSH  Paroxysmal atrial fibrillation (HCC) NSR at this time, following Dr. Rayann Heman, continue elequis  Mixed hyperlipidemia -continue medications, check lipids, decrease fatty foods, increase activity.  -     Lipid panel  Other abnormal glucose Discussed general issues about diabetes pathophysiology and management., Educational material distributed., Suggested low cholesterol diet., Encouraged aerobic exercise., Discussed foot care., Reminded to get yearly retinal exam.  Medication management -     Magnesium  Vitamin D deficiency Continue supplement  Benign prostatic hyperplasia with urinary frequency Continue follow up urology - Dr. Tresa Moore  Irritable bowel syndrome, unspecified type Diet discussed  Class 1 obesity due to excess calories with serious comorbidity and body mass index (BMI) of 32.0 to 32.9 in adult - long discussion about weight loss, diet, and exercise  Obesity - long discussion about weight loss, diet, and exercise  History of SCC (squamous cell carcinoma) of skin Follow up DERM  Knee arthritis Followed by Dr. Noemi Chapel, requesting surgical clearance today, medical clearance provided, will need cardiac clearance from Dr. Rayann Heman  Future Appointments  Date Time Provider [place]  03/01/2018 11:10 AM [facility] DEVICE REMOTES [facility]  03/09/2018 11:00 AM [facility] PAT 1 [facility] None  04/05/2018  3:45 PM Unk Pinto, MD [facility] None  11/19/2018  2:15 PM Allred, Jeneen Rinks, MD [facility]     Plan:   During the course of the visit the patient was educated and counseled about appropriate screening and preventive services including:     Pneumococcal vaccine   Influenza vaccine  Td vaccine  Screening electrocardiogram  Colorectal cancer screening  Diabetes screening  Glaucoma screening  Nutrition counseling    Subjective:  [name] is a 72 y.o. male who presents for Medicare Annual Wellness Visit and 3 month follow up for HTN, hyperlipidemia, and vitamin D Def. Patient was diagnosed with pAfib attributed to Decongestants for an Upper/lower Respiratory infection, followed by Dr Rayann Heman, has loop recorder, on elequis for CHADSVASC 2. Follows with Dr. Tresa Moore for BPH. He is followed by Dr. Noemi Chapel and scheduled for R TKA in September 9th; surgical clearance requested today and will be provided, pending cardiac clearance.   BMI is Body mass index is 31.87 kg/m., he has not been working on diet, doing PT 5 days a week.  Wt Readings from Last 3 Encounters:  01/31/18 235 lb (106.6 kg)  11/20/17 242 lb (109.8 kg)  09/12/17 236 lb 12.8 oz (107.4 kg)   His blood pressure has been controlled at home, today their BP is BP: 110/62 He does workout. He denies chest pain, shortness of breath, dizziness.   He is on cholesterol medication, on crestor 20 mg daily, has increased this and denies myalgias. His cholesterol is at goal. The cholesterol last visit was:   Lab Results  Component Value Date   CHOL 139 09/12/2017   HDL 41 09/12/2017   LDLCALC 70 09/12/2017   TRIG 214 (H) 09/12/2017   CHOLHDL 3.4 09/12/2017    Had elevated A1C in 2013, has done well with diet/exercise. Last A1C in the office was:  Lab Results  Component Value Date   HGBA1C 5.4 09/12/2017   Patient is on Vitamin D supplement.  Lab Results  Component Value Date   VD25OH 71 09/12/2017        Medication Review: Current Outpatient Medications on File Prior to Visit  Medication Sig Dispense Refill  . ALPRAZolam (XANAX) 1 MG tablet Take 1/2 to 1 tablet 2 to 3 x / day ONLY if needed for Anxiety/Panic Attack & please try to limit to 5 days  /week to avoid addiction 90 tablet 0  . Carboxymethylcellulose Sodium (REFRESH TEARS OP) Apply 1 drop to eye daily as needed (dry eyes).    . Cholecalciferol (VITAMIN D3) 5000 units CAPS Take 1 capsule daily by mouth.    . diclofenac sodium (VOLTAREN) 1 % GEL Apply topically 4 (four) times daily.    Marland Kitchen ELIQUIS 5 MG TABS tablet TAKE 1 TABLET BY MOUTH TWO  TIMES DAILY 180 tablet 1  . finasteride (PROSCAR) 5 MG tablet Take 5 mg by mouth daily.    . Flaxseed, Linseed, (FLAX SEED OIL PO) Take 1 capsule by mouth daily.    Marland Kitchen losartan (COZAAR) 50 MG tablet TAKE 1 TABLET BY MOUTH  DAILY 90 tablet 3  . Multiple Vitamin (MULTIVITAMIN) tablet Take 1 tablet by mouth daily.    . Omega-3 Fatty Acids (FISH OIL PO) Take 1 capsule by mouth 2 (two) times daily.    Marland Kitchen RAPAFLO 8 MG CAPS capsule Take 8 mg by mouth as needed (Take as directed for urinary rentention).     . rosuvastatin (CRESTOR) 40 MG tablet Take 1/2 to 1 tablet daily as directed. 90 tablet 3  . tadalafil (CIALIS) 20 MG tablet Take 1 tablet (20 mg total) by mouth daily as needed for erectile dysfunction. Take 1/2 to 1 tablet by mouth daily or as directed 88 tablet 3  . verapamil (CALAN-SR) 120 MG CR tablet TAKE 1 TABLET BY MOUTH  DAILY 90 tablet 1  . vitamin C (ASCORBIC ACID) 500 MG tablet Take 500 mg by mouth daily.     No current facility-administered medications on file prior to visit.     Current Problems (verified) Patient Active Problem List   Diagnosis Date Noted  . Aortic atherosclerosis ([place]) 10/11/2017  . History of SCC (squamous cell carcinoma) of skin 10/04/2016  . Atrial fibrillation ([place]) 08/01/2016  . Medication management 09/30/2013  . Obesity (BMI 30.0-34.9) 09/30/2013  . Hypertension   . Hyperlipidemia   . Benign prostatic hyperplasia   . Other abnormal glucose   . IBS (irritable bowel syndrome)   . Vitamin D deficiency     Screening Tests Immunization History  Administered Date(s) Administered  . DTaP 07/11/2005  .  Influenza Whole 04/02/2013  . Influenza, High Dose Seasonal PF 05/01/2014, 05/21/2015, 03/29/2016, 05/31/2017  . Pneumococcal Conjugate-13 05/01/2014  . Pneumococcal Polysaccharide-23 09/08/2008, 03/29/2016  . Td 12/16/2015  . Zoster 12/09/2008   Preventative care: Last colonoscopy: 2008 Dr. Deatra Ina due 2018, will have after knee  Stress test: 2018 Echo 08/2016 CXR 09/2016  Prior vaccinations: TD or Tdap: 2017 Influenza: 2018 Pneumococcal: 2010, 2017 Prevnar 13: 2015 Shingles/Zostavax: 2010  Names of Other Physician/Practitioners you currently use: 1. [facility] here for primary care 2. Dr. Satira Sark, eye doctor, visit yearly 2019 3. Dr. Jolayne Panther, dentist, q 6 months 2019  Patient Care Team: Unk Pinto, MD as PCP - General (Internal Medicine) Thompson Grayer, MD as PCP - Cardiology (Cardiology) Charlotte Crumb, MD as Consulting Physician (Orthopedic Surgery) Inda Castle, MD (Inactive) as Consulting Physician (Gastroenterology) Alexis Frock, MD as Consulting Physician (Urology)  Thompson Grayer, MD as Consulting Physician (Cardiology)   History reviewed: allergies, current medications, past family history, past medical history, past social history, past surgical history and problem list  Allergies Allergies  Allergen Reactions  . Xarelto [Rivaroxaban]     Severe body aches  . Clonazepam Other (See Comments)    unknown  . Flomax [Tamsulosin Hcl] Other (See Comments)    hypotension  . Lipitor [Atorvastatin] Other (See Comments)    Myalgias    SURGICAL HISTORY He  has a past surgical history that includes Appendectomy (02-07-2007); Anterior cervical decomp/discectomy fusion (01-25-2002); LEFT INGUINAL HERNIA REPAIR (1970); Umbilical hernia repair (2003); LEFT ROTATOR CUFF REPAIR (2002); Anterior cruciate ligament repair (1990); Cervical fusion (2000); LEFT ANKLE SURG. (1980); Hip surgery (1999); Colonoscopy (05-30-2011);  Tonsillectomy; Trigger finger release (Right, 07/17/2013); Tendon reconstruction (Left, 07/17/2013); and LOOP RECORDER INSERTION (N/A, 09/19/2016). FAMILY HISTORY His family history includes Alcohol abuse in his father; Diabetes in his father; Heart attack (age of onset: 61) in his father; Heart disease in his father and mother; Stroke (age of onset: 42) in his mother. SOCIAL HISTORY He  reports that he has never smoked. He has never used smokeless tobacco. He reports that he drinks about 2.4 oz of alcohol per week. He reports that he does not use drugs.  MEDICARE WELLNESS OBJECTIVES: Physical activity: Current Exercise Habits: Structured exercise class, Type of exercise: Other - see comments(PT), Time (Minutes): 30, Frequency (Times/Week): 5, Weekly Exercise (Minutes/Week): 150, Intensity: Mild, Exercise limited by: orthopedic condition(s) Cardiac risk factors: Cardiac Risk Factors include: male gender;hypertension;dyslipidemia;advanced age (>40men, >65 women);family history of premature cardiovascular disease Depression/mood screen:   Depression screen [facility] 2/9 01/31/2018  Decreased Interest 0  Down, Depressed, Hopeless 0  PHQ - 2 Score 0    ADLs:  In your present state of health, do you have any difficulty performing the following activities: 01/31/2018 09/13/2017  Hearing? N N  Vision? N N  Difficulty concentrating or making decisions? N N  Walking or climbing stairs? Y N  Comment R knee arthritis, will have TKA -  Dressing or bathing? N N  Doing errands, shopping? N N  Some recent data might be hidden     Cognitive Testing  Alert? Yes  Normal Appearance?Yes  Oriented to person? Yes  Place? Yes   Time? Yes  Recall of three objects?  Yes  Can perform simple calculations? Yes  Displays appropriate judgment?Yes  Can read the correct time from a watch face?Yes  EOL planning: Does Patient Have a Medical Advance Directive?: Yes Type of Advance Directive: Healthcare Power of Attorney, Living  will Does patient want to make changes to medical advance directive?: No - Patient declined Copy of [place] in Chart?: No - copy requested   Objective:   Blood pressure 110/62, pulse 82, temperature (!) 97.3 F (36.3 C), height 6' (1.829 m), weight 235 lb (106.6 kg), SpO2 94 %. Body mass index is 31.87 kg/m.  General appearance: alert, no distress, WD/WN, male HEENT: normocephalic, sclerae anicteric, TMs pearly, nares patent, no discharge or erythema, pharynx normal Oral cavity: MMM, no lesions Neck: supple, no lymphadenopathy, no thyromegaly, no masses Heart: RRR, normal S1, S2, no murmurs Lungs: CTA bilaterally, no wheezes, rhonchi, or rales Abdomen: +bs, soft, non tender, non distended, no masses, no hepatomegaly, no splenomegaly Musculoskeletal: nontender, right knee with obvious bony enlargement and effusion without injection or heat Extremities: no edema, no cyanosis, no clubbing Pulses: 2+ symmetric, upper and lower extremities, normal cap  refill Neurological: alert, oriented x 3, CN2-12 intact, strength normal upper extremities and lower extremities, sensation normal throughout, DTRs 2+ throughout, no cerebellar signs, gait antalgic Psychiatric: normal affect, behavior normal, pleasant   Medicare Attestation I have personally reviewed: The patient's medical and social history Their use of alcohol, tobacco or illicit drugs Their current medications and supplements The patient's functional ability including ADLs,fall risks, home safety risks, cognitive, and hearing and visual impairment Diet and physical activities Evidence for depression or mood disorders  The patient's weight, height, BMI, and visual acuity have been recorded in the chart.  I have made referrals, counseling, and provided education to the patient based on review of the above and I have provided the patient with a written personalized care plan for preventive services.     Izora Ribas, NP   01/31/2018

## 2018-01-30 NOTE — Telephone Encounter (Signed)
Follow Up:     Returning call from [place] from yesterday.[name]

## 2018-01-30 NOTE — Telephone Encounter (Signed)
This encounter was created in error - please disregard.

## 2018-01-31 ENCOUNTER — Encounter: Payer: Self-pay | Admitting: Adult Health

## 2018-01-31 ENCOUNTER — Ambulatory Visit (INDEPENDENT_AMBULATORY_CARE_PROVIDER_SITE_OTHER): Payer: Medicare Other | Admitting: Adult Health

## 2018-01-31 VITALS — BP 110/62 | HR 82 | Temp 97.3°F | Ht 72.0 in | Wt 235.0 lb

## 2018-01-31 DIAGNOSIS — Z0001 Encounter for general adult medical examination with abnormal findings: Secondary | ICD-10-CM

## 2018-01-31 DIAGNOSIS — Z0000 Encounter for general adult medical examination without abnormal findings: Secondary | ICD-10-CM

## 2018-01-31 DIAGNOSIS — K589 Irritable bowel syndrome without diarrhea: Secondary | ICD-10-CM

## 2018-01-31 DIAGNOSIS — I480 Paroxysmal atrial fibrillation: Secondary | ICD-10-CM | POA: Diagnosis not present

## 2018-01-31 DIAGNOSIS — Z79899 Other long term (current) drug therapy: Secondary | ICD-10-CM | POA: Diagnosis not present

## 2018-01-31 DIAGNOSIS — Z85828 Personal history of other malignant neoplasm of skin: Secondary | ICD-10-CM

## 2018-01-31 DIAGNOSIS — M1710 Unilateral primary osteoarthritis, unspecified knee: Secondary | ICD-10-CM

## 2018-01-31 DIAGNOSIS — N401 Enlarged prostate with lower urinary tract symptoms: Secondary | ICD-10-CM | POA: Diagnosis not present

## 2018-01-31 DIAGNOSIS — R350 Frequency of micturition: Secondary | ICD-10-CM

## 2018-01-31 DIAGNOSIS — E669 Obesity, unspecified: Secondary | ICD-10-CM

## 2018-01-31 DIAGNOSIS — R7309 Other abnormal glucose: Secondary | ICD-10-CM

## 2018-01-31 DIAGNOSIS — R6889 Other general symptoms and signs: Secondary | ICD-10-CM

## 2018-01-31 DIAGNOSIS — I700 Atherosclerosis of aorta: Secondary | ICD-10-CM

## 2018-01-31 DIAGNOSIS — E559 Vitamin D deficiency, unspecified: Secondary | ICD-10-CM | POA: Diagnosis not present

## 2018-01-31 DIAGNOSIS — I10 Essential (primary) hypertension: Secondary | ICD-10-CM

## 2018-01-31 DIAGNOSIS — E782 Mixed hyperlipidemia: Secondary | ICD-10-CM | POA: Diagnosis not present

## 2018-01-31 DIAGNOSIS — E66811 Obesity, class 1: Secondary | ICD-10-CM

## 2018-01-31 NOTE — Patient Instructions (Signed)
Aim for 7+ servings of fruits and vegetables daily  80+ fluid ounces of water or unsweet tea for healthy kidneys  Limit animal fats in diet for cholesterol and heart health - choose grass fed whenever available  Aim for low stress - take time to unwind and care for your mental health  Aim for 150 min of moderate intensity exercise weekly for heart health, and weights twice weekly for bone health  Aim for 7-9 hours of sleep daily      When it comes to diets, agreement about the perfect plan isn't easy to find, even among the experts. Experts at the [employer] developed an idea known as the Healthy Eating Plate. Just imagine a plate divided into logical, healthy portions.  The emphasis is on diet quality:  Load up on vegetables and fruits - one-half of your plate: Aim for color and variety, and remember that potatoes don't count.  Go for whole grains - one-quarter of your plate: Whole wheat, barley, wheat berries, quinoa, oats, brown rice, and foods made with them. If you want pasta, go with whole wheat pasta.  Protein power - one-quarter of your plate: Fish, chicken, beans, and nuts are all healthy, versatile protein sources. Limit red meat.  The diet, however, does go beyond the plate, offering a few other suggestions.  Use healthy plant oils, such as olive, canola, soy, corn, sunflower and peanut. Check the labels, and avoid partially hydrogenated oil, which have unhealthy trans fats.  If you're thirsty, drink water. Coffee and tea are good in moderation, but skip sugary drinks and limit milk and dairy products to one or two daily servings.  The type of carbohydrate in the diet is more important than the amount. Some sources of carbohydrates, such as vegetables, fruits, whole grains, and beans-are healthier than others.  Finally, stay active.   

## 2018-01-31 NOTE — Telephone Encounter (Signed)
Pt takes Eliquis for afib with CHADS2VASc score of 2 (age, HTN). Renal function is normal. Ok to hold Eliquis for 3 days prior to spinal injection. Pt already cleared for TKA in 7/8 phone note.

## 2018-01-31 NOTE — Telephone Encounter (Signed)
  Call back staff: Clearance letter has been faxed to the requesting surgeon. Please contact the surgeon's office to ensure it has been received. This phone note will be removed from the preop pool. Please sign encounter when completed.  Richardson Dopp, PA-C    01/31/2018 3:09 PM

## 2018-01-31 NOTE — Telephone Encounter (Signed)
I s/w sherri at [facility] and confirmed clearance letter was recv'd for injectio. Sherri does state to me though she never received clearance for the knee surgery. I have faxed this over to her today as well.

## 2018-01-31 NOTE — Addendum Note (Signed)
Addended by: Izora Ribas on: 01/31/2018 04:53 PM   Modules accepted: Orders

## 2018-02-01 LAB — COMPLETE METABOLIC PANEL WITH GFR
AG Ratio: 1.7 (calc) (ref 1.0–2.5)
ALKALINE PHOSPHATASE (APISO): 64 U/L (ref 40–115)
ALT: 38 U/L (ref 9–46)
AST: 19 U/L (ref 10–35)
Albumin: 4 g/dL (ref 3.6–5.1)
BUN: 17 mg/dL (ref 7–25)
CO2: 27 mmol/L (ref 20–32)
CREATININE: 0.84 mg/dL (ref 0.70–1.18)
Calcium: 9.4 mg/dL (ref 8.6–10.3)
Chloride: 106 mmol/L (ref 98–110)
GFR, Est African American: 101 mL/min/{1.73_m2} (ref >=60)
GFR, Est Non African American: 87 mL/min/{1.73_m2} (ref >=60)
GLUCOSE: 122 mg/dL — AB (ref 65–99)
Globulin: 2.3 g/dL (calc) (ref 1.9–3.7)
Potassium: 4.1 mmol/L (ref 3.5–5.3)
Sodium: 141 mmol/L (ref 135–146)
Total Bilirubin: 0.4 mg/dL (ref 0.2–1.2)
Total Protein: 6.3 g/dL (ref 6.1–8.1)

## 2018-02-01 LAB — CBC WITH DIFFERENTIAL/PLATELET
BASOS PCT: 0.6 %
Basophils Absolute: 65 cells/uL (ref 0–200)
Eosinophils Absolute: 194 cells/uL (ref 15–500)
Eosinophils Relative: 1.8 %
HCT: 46 % (ref 38.5–50.0)
Hemoglobin: 15.6 g/dL (ref 13.2–17.1)
Lymphs Abs: 2106 cells/uL (ref 850–3900)
MCH: 30.5 pg (ref 27.0–33.0)
MCHC: 33.9 g/dL (ref 32.0–36.0)
MCV: 90 fL (ref 80.0–100.0)
MONOS PCT: 8.9 %
MPV: 10 fL (ref 7.5–12.5)
Neutro Abs: 7474 cells/uL (ref 1500–7800)
Neutrophils Relative %: 69.2 %
PLATELETS: 208 10*3/uL (ref 140–400)
RBC: 5.11 10*6/uL (ref 4.20–5.80)
RDW: 13.1 % (ref 11.0–15.0)
TOTAL LYMPHOCYTE: 19.5 %
WBC mixed population: 961 cells/uL — ABNORMAL HIGH (ref 200–950)
WBC: 10.8 10*3/uL (ref 3.8–10.8)

## 2018-02-01 LAB — LIPID PANEL
CHOL/HDL RATIO: 2.9 (calc) (ref <5.0)
CHOLESTEROL: 141 mg/dL (ref <200)
HDL: 49 mg/dL (ref >40)
LDL CHOLESTEROL (CALC): 70 mg/dL
Non-HDL Cholesterol (Calc): 92 mg/dL (calc) (ref <130)
Triglycerides: 139 mg/dL (ref <150)

## 2018-02-01 LAB — TSH: TSH: 0.8 m[IU]/L (ref 0.40–4.50)

## 2018-02-13 DIAGNOSIS — R351 Nocturia: Secondary | ICD-10-CM | POA: Diagnosis not present

## 2018-02-13 DIAGNOSIS — R300 Dysuria: Secondary | ICD-10-CM | POA: Diagnosis not present

## 2018-02-13 DIAGNOSIS — N401 Enlarged prostate with lower urinary tract symptoms: Secondary | ICD-10-CM | POA: Diagnosis not present

## 2018-02-13 DIAGNOSIS — R310 Gross hematuria: Secondary | ICD-10-CM | POA: Diagnosis not present

## 2018-02-19 ENCOUNTER — Telehealth: Payer: Self-pay | Admitting: Cardiology

## 2018-02-19 NOTE — Telephone Encounter (Signed)
Patient called and stated that he feels light headed today and his pulse feels like it is bouncing all over. Instructed pt to send a remote transmission.

## 2018-02-19 NOTE — Telephone Encounter (Signed)
[name] feels as if he is in AF yesterday and today with episodes of dizziness. Transmission received and reviewed. AF noted 02/18/18 lasting 1 hr @ 158bpm. On Eliquis and verapamil. AF 02/19/18 since 1:08am with rates 118-167bpm. He took an extra verapamil last night and it helped but he does not have orders to do so.  Sending to AF clinic for further advisement.

## 2018-02-19 NOTE — Telephone Encounter (Signed)
Upon follow up call - patient had just converted back into NSR. He was feeling much better. Discussed with Roderic Palau NP -- recommends sending another remote tomorrow to see if further breakthrough afib. Also recommends taking an extra tablet of verapamil for breakthrough if not near his normal dosing of verapamil. Pt verbalizes understanding.

## 2018-02-21 ENCOUNTER — Ambulatory Visit (INDEPENDENT_AMBULATORY_CARE_PROVIDER_SITE_OTHER): Payer: Medicare Other

## 2018-02-21 ENCOUNTER — Other Ambulatory Visit: Payer: Self-pay | Admitting: Internal Medicine

## 2018-02-21 ENCOUNTER — Encounter (HOSPITAL_COMMUNITY): Payer: Self-pay | Admitting: Pharmacy Technician

## 2018-02-21 ENCOUNTER — Emergency Department (HOSPITAL_COMMUNITY): Payer: Medicare Other

## 2018-02-21 ENCOUNTER — Emergency Department (HOSPITAL_COMMUNITY)
Admission: EM | Admit: 2018-02-21 | Discharge: 2018-02-21 | Disposition: A | Discharge status: Home / Self Care | Payer: Medicare Other | Attending: Emergency Medicine | Admitting: Emergency Medicine

## 2018-02-21 ENCOUNTER — Encounter: Payer: Self-pay | Admitting: Internal Medicine

## 2018-02-21 DIAGNOSIS — R002 Palpitations: Secondary | ICD-10-CM

## 2018-02-21 DIAGNOSIS — Z85828 Personal history of other malignant neoplasm of skin: Secondary | ICD-10-CM | POA: Diagnosis not present

## 2018-02-21 DIAGNOSIS — Z7901 Long term (current) use of anticoagulants: Secondary | ICD-10-CM | POA: Diagnosis not present

## 2018-02-21 DIAGNOSIS — I10 Essential (primary) hypertension: Secondary | ICD-10-CM | POA: Diagnosis not present

## 2018-02-21 DIAGNOSIS — Z95818 Presence of other cardiac implants and grafts: Secondary | ICD-10-CM | POA: Diagnosis not present

## 2018-02-21 DIAGNOSIS — R000 Tachycardia, unspecified: Secondary | ICD-10-CM

## 2018-02-21 DIAGNOSIS — I4892 Unspecified atrial flutter: Secondary | ICD-10-CM

## 2018-02-21 DIAGNOSIS — R55 Syncope and collapse: Secondary | ICD-10-CM | POA: Diagnosis not present

## 2018-02-21 DIAGNOSIS — R7303 Prediabetes: Secondary | ICD-10-CM | POA: Diagnosis not present

## 2018-02-21 DIAGNOSIS — R0902 Hypoxemia: Secondary | ICD-10-CM | POA: Diagnosis not present

## 2018-02-21 LAB — CBC WITH DIFFERENTIAL/PLATELET
Abs Immature Granulocytes: 0.1 10*3/uL (ref 0.0–0.1)
Basophils Absolute: 0.1 10*3/uL (ref 0.0–0.1)
Basophils Relative: 1 %
Eosinophils Absolute: 0.1 10*3/uL (ref 0.0–0.7)
Eosinophils Relative: 2 %
HCT: 51.5 % (ref 39.0–52.0)
HEMOGLOBIN: 16.8 g/dL (ref 13.0–17.0)
IMMATURE GRANULOCYTES: 1 %
LYMPHS ABS: 2.1 10*3/uL (ref 0.7–4.0)
LYMPHS PCT: 23 %
MCH: 30.8 pg (ref 26.0–34.0)
MCHC: 32.6 g/dL (ref 30.0–36.0)
MCV: 94.3 fL (ref 78.0–100.0)
MONOS PCT: 11 %
Monocytes Absolute: 1 10*3/uL (ref 0.1–1.0)
NEUTROS PCT: 62 %
Neutro Abs: 5.8 10*3/uL (ref 1.7–7.7)
Platelets: 332 10*3/uL (ref 150–400)
RBC: 5.46 MIL/uL (ref 4.22–5.81)
RDW: 13.8 % (ref 11.5–15.5)
WBC: 9.1 10*3/uL (ref 4.0–10.5)

## 2018-02-21 LAB — COMPREHENSIVE METABOLIC PANEL
ALT: 36 U/L (ref 0–44)
AST: 29 U/L (ref 15–41)
Albumin: 4.2 g/dL (ref 3.5–5.0)
Alkaline Phosphatase: 63 U/L (ref 38–126)
Anion gap: 10 (ref 5–15)
BUN: 13 mg/dL (ref 8–23)
CHLORIDE: 107 mmol/L (ref 98–111)
CO2: 25 mmol/L (ref 22–32)
CREATININE: 0.8 mg/dL (ref 0.61–1.24)
Calcium: 10.2 mg/dL (ref 8.9–10.3)
GFR calc non Af Amer: >60 mL/min (ref >60)
Glucose, Bld: 97 mg/dL (ref 70–99)
POTASSIUM: 4 mmol/L (ref 3.5–5.1)
SODIUM: 142 mmol/L (ref 135–145)
Total Bilirubin: 0.6 mg/dL (ref 0.3–1.2)
Total Protein: 7.2 g/dL (ref 6.5–8.1)

## 2018-02-21 LAB — TSH: TSH: 1.089 u[IU]/mL (ref 0.350–4.500)

## 2018-02-21 LAB — PROTIME-INR
INR: 1.06
PROTHROMBIN TIME: 13.7 s (ref 11.4–15.2)

## 2018-02-21 LAB — TROPONIN I: Troponin I: <0.03 ng/mL (ref <0.03)

## 2018-02-21 NOTE — ED Triage Notes (Signed)
Pt arrived via [facility] from Dr. Gabriel Carina where he went unscheduled because he noticed his heart rate increase.  Pt has loop recorder.  Denies SOB, denies chest pain. EMS gave 10mg  Metoprolol.

## 2018-02-21 NOTE — Discharge Instructions (Signed)
You were seen in the ED today with heart palpitations and were found to be in atrial flutter. We are increasing your dose of Verapamil to 120 mg daily after discussing your case with Dr. Claiborne Billings. Call Dr. Rayann Heman in the morning to discuss the medication change and your symptoms. Return to the ED with any new or worsening symptoms.

## 2018-02-21 NOTE — Telephone Encounter (Signed)
[name] called regarding AF and sent transmission. Only new AF episode was Monday about 1:30 pm for 10 minutes. No AF noted last night. He has no further questions.

## 2018-02-21 NOTE — Progress Notes (Signed)
Patient presents to the office for a nurse visit for a BP check and complaining of SOB and rapid heart rate. BP taken in right arm with machine with a reading of 143/106. HR at 145bpm. EKG completed with results given to Dr. Melford Aase. EMS called to transport patient to ER.

## 2018-02-21 NOTE — ED Notes (Signed)
Patient verbalizes understanding of discharge instructions. Opportunity for questioning and answers were provided. 

## 2018-02-21 NOTE — ED Provider Notes (Signed)
Emergency Department Provider Note   I have reviewed the triage vital signs and the nursing notes.   HISTORY  Chief Complaint Tachycardia   HPI DISHAWN Perez is a 72 y.o. male with PMH of HLD, HTN, IBS, and PAF on Eliquis presents to the emergency department for evaluation of intermittent heart palpitations and uncomfortable feeling in his chest.  Patient states that symptoms have been intermittent over the last 2 to 3 days.  He has a loop recorder and a manual upload showed several runs of atrial fibrillation with RVR earlier in the week.  Today around 10 AM he felt return of the symptoms but seemed more severe.  He presented to his primary care physician who found him to have a rate in the 150s and rhythm concerning for atrial flutter.  EMS was called and administered 10 mg of metoprolol which improved the patient's symptoms.  He does take verapamil and took an additional half tablet, as instructed by his cardiologist, when his heart rate was elevated with no relief. Denies specific chest pain.    Past Medical History:  Diagnosis Date  . Arthritis   . Benign prostatic hyperplasia   . BPH (benign prostatic hypertrophy)   . History of SCC (squamous cell carcinoma) of skin 10/04/2016  . Hyperlipidemia   . Hypertension   . IBS (irritable bowel syndrome)   . Mild acid reflux occasional  . Obesity   . Other abnormal glucose   . Pelvic pain in male   . Pre-diabetes   . Vitamin D deficiency   . Wears glasses     Patient Active Problem List   Diagnosis Date Noted  . Aortic atherosclerosis ([place]) 10/11/2017  . History of SCC (squamous cell carcinoma) of skin 10/04/2016  . Atrial fibrillation ([place]) 08/01/2016  . Medication management 09/30/2013  . Obesity (BMI 30.0-34.9) 09/30/2013  . Hypertension   . Hyperlipidemia   . Benign prostatic hyperplasia   . Other abnormal glucose   . IBS (irritable bowel syndrome)   . Vitamin D deficiency     Past Surgical History:    Procedure Laterality Date  . ANTERIOR CERVICAL DECOMP/DISCECTOMY FUSION  01-25-2002   C4 - C5  . ANTERIOR CRUCIATE LIGAMENT REPAIR  1990   RIGHT  . APPENDECTOMY  02-07-2007  . CERVICAL FUSION  2000   C5 - 6  . COLONOSCOPY  05-30-2011   HEMORRHOIDS  . HIP SURGERY  1999   LEFT HIP --  REMOVAL CALCIUM BUILD-UP  . LEFT ANKLE SURG.  1980  . LEFT INGUINAL HERNIA REPAIR  1970  . LEFT ROTATOR CUFF REPAIR  2002  . LOOP RECORDER INSERTION N/A 09/19/2016   Procedure: Loop Recorder Insertion;  Surgeon: Thompson Grayer, MD;  Location: [facility];  Service: Cardiovascular;  Laterality: N/A;  . TENDON RECONSTRUCTION Left 07/17/2013   Procedure: LEFT ELBOW LATERAL RECONSTRUCTION;  Surgeon: Schuyler Amor, MD;  Location: [facility];  Service: Orthopedics;  Laterality: Left;  . TONSILLECTOMY    . TRIGGER FINGER RELEASE Right 07/17/2013   Procedure: RELEASE A-1 PULLEY RIGHT RING FINGER;  Surgeon: Schuyler Amor, MD;  Location: [facility];  Service: Orthopedics;  Laterality: Right;  . UMBILICAL HERNIA REPAIR  2003    Allergies Xarelto [rivaroxaban]; Atorvastatin; Clonazepam; and Tamsulosin hcl  Family History  Problem Relation Age of Onset  . Heart disease Mother   . Stroke Mother 43  . Heart disease Father   . Diabetes Father   .  Heart attack Father 31  . Alcohol abuse Father     Social History Social History   Tobacco Use  . Smoking status: Never Smoker  . Smokeless tobacco: Never Used  Substance Use Topics  . Alcohol use: Yes    Alcohol/week: 4.0 standard drinks    Types: 4 Standard drinks or equivalent per week    Comment: OCCASIONAL  . Drug use: No    Review of Systems  Constitutional: No fever/chills Eyes: No visual changes. ENT: No sore throat. Cardiovascular: Denies chest pain. Positive heart palpitations.  Respiratory: Denies shortness of breath. Gastrointestinal: No abdominal pain.  No nausea, no vomiting.  No diarrhea.  No  constipation. Genitourinary: Negative for dysuria. Musculoskeletal: Negative for back pain. Skin: Negative for rash. Neurological: Negative for headaches, focal weakness or numbness.  10-point ROS otherwise negative.  ____________________________________________   PHYSICAL EXAM:  VITAL SIGNS: ED Triage Vitals  Enc Vitals Group     BP 02/21/18 1615 130/83     Pulse Rate 02/21/18 1615 62     Resp 02/21/18 1615 18     Temp --      Temp src --      SpO2 02/21/18 1600 97 %     Weight 02/21/18 1603 234 lb 12.6 oz (106.5 kg)     Height 02/21/18 1603 6' (1.829 m)     Pain Score 02/21/18 1603 0   Constitutional: Alert and oriented. Well appearing and in no acute distress. Eyes: Conjunctivae are normal.  Head: Atraumatic. Nose: No congestion/rhinnorhea. Mouth/Throat: Mucous membranes are moist.  Neck: No stridor.   Cardiovascular: Normal rate, regular rhythm. Good peripheral circulation. Grossly normal heart sounds.   Respiratory: Normal respiratory effort.  No retractions. Lungs CTAB. Gastrointestinal: Soft and nontender. No distention.  Musculoskeletal: No lower extremity tenderness nor edema. No gross deformities of extremities. Neurologic:  Normal speech and language. No gross focal neurologic deficits are appreciated.  Skin:  Skin is warm, dry and intact. No rash noted.  ____________________________________________   LABS (all labs ordered are listed, but only abnormal results are displayed)  Labs Reviewed  COMPREHENSIVE METABOLIC PANEL  CBC WITH DIFFERENTIAL/PLATELET  TROPONIN I  PROTIME-INR  TSH   ____________________________________________  EKG   EKG Interpretation  Date/Time:  Wednesday February 21 2018 16:03:27 EDT Ventricular Rate:  66 PR Interval:    QRS Duration: 91 QT Interval:  377 QTC Calculation: 395 R Axis:   45 Text Interpretation:  Sinus rhythm Abnormal R-wave progression, early transition ST changes inferiorly are simialr to prior. No STEMI.   Confirmed by Nanda Quinton [id number]) on 02/21/2018 4:07:47 PM       ____________________________________________  RADIOLOGY  Dg Chest 2 View  Result Date: 02/21/2018 CLINICAL DATA:  Tachycardia with lightheadedness EXAM: CHEST - 2 VIEW COMPARISON:  CT chest 10/11/2017, radiograph 08/01/2016 FINDINGS: Surgical hardware in the cervical spine. Recording device over the left lower chest. No acute opacity or pleural effusion. Normal heart size. Aortic atherosclerosis. No pneumothorax. Degenerative changes of the spine. IMPRESSION: No active cardiopulmonary disease. Electronically Signed   By: Donavan Foil M.D.   On: 02/21/2018 17:11    ____________________________________________   PROCEDURES  Procedure(s) performed:   Procedures  None ____________________________________________   INITIAL IMPRESSION / ASSESSMENT AND PLAN / ED COURSE  Pertinent labs & imaging results that were available during my care of the patient were reviewed by me and considered in my medical decision making (see chart for details).  Patient presents to the emergency department with intermittent  A. fib/flutter symptoms for the past several days.  He is been compliant with his anticoagulation and verapamil.  He received metoprolol 10 mg IV with EMS in route from the PCP and on arrival to the emergency department he is in a normal sinus rhythm.  No evidence of acute ischemia on EKG.  Plan for baseline labs and chest x-ray.  Will discuss the case with cardiology on-call to decide on any medication changes for better rate control at home.   05:30 PM Spoke with Dr. Claiborne Billings regarding the case and findings. Plan to increase the Verapamil to 180 mg and have the patient call for f/u with Dr. Rayann Heman this week. Labs and imaging reviewed.   At this time, I do not feel there is any life-threatening condition present. I have reviewed and discussed all results (EKG, imaging, lab, urine as appropriate), exam findings with patient. I  have reviewed nursing notes and appropriate previous records.  I feel the patient is safe to be discharged home without further emergent workup. Discussed usual and customary return precautions. Patient and family (if present) verbalize understanding and are comfortable with this plan.  Patient will follow-up with their primary care provider. If they do not have a primary care provider, information for follow-up has been provided to them. All questions have been answered.  ____________________________________________  FINAL CLINICAL IMPRESSION(S) / ED DIAGNOSES  Final diagnoses:  Atrial flutter by electrocardiogram [facility])  Palpitations    Note:  This document was prepared using Dragon voice recognition software and may include unintentional dictation errors.  Nanda Quinton, MD Emergency Medicine    Seymone Forlenza, [facility], MD 02/21/18 1739

## 2018-02-22 ENCOUNTER — Telehealth: Payer: Self-pay | Admitting: Internal Medicine

## 2018-02-22 DIAGNOSIS — G5603 Carpal tunnel syndrome, bilateral upper limbs: Secondary | ICD-10-CM | POA: Diagnosis not present

## 2018-02-22 DIAGNOSIS — R200 Anesthesia of skin: Secondary | ICD-10-CM | POA: Diagnosis not present

## 2018-02-22 DIAGNOSIS — M4722 Other spondylosis with radiculopathy, cervical region: Secondary | ICD-10-CM | POA: Diagnosis not present

## 2018-02-22 DIAGNOSIS — M542 Cervicalgia: Secondary | ICD-10-CM | POA: Diagnosis not present

## 2018-02-22 DIAGNOSIS — M5412 Radiculopathy, cervical region: Secondary | ICD-10-CM | POA: Diagnosis not present

## 2018-02-22 DIAGNOSIS — M5020 Other cervical disc displacement, unspecified cervical region: Secondary | ICD-10-CM | POA: Diagnosis not present

## 2018-02-22 DIAGNOSIS — M5030 Other cervical disc degeneration, unspecified cervical region: Secondary | ICD-10-CM | POA: Diagnosis not present

## 2018-02-22 NOTE — Telephone Encounter (Signed)
New Message       Patient is needing an appt as soon as possible. Nothing is available until 03/14/2018. Patient is unavailable  08/22 08/28 08/30  all because of pre surgery.

## 2018-02-22 NOTE — Telephone Encounter (Signed)
Returned call to Pt.  Advised appt made for 02/28/2018 at 10:45 am with Dr. Rayann Heman for er f/u.  Pt to continue to take increased dose of verapamil.  Advised to call office if any further issues.  Pt indicates understanding.

## 2018-02-26 ENCOUNTER — Telehealth: Payer: Self-pay

## 2018-02-26 NOTE — Telephone Encounter (Signed)
Reviewed with Dr. Rayann Heman- he recommends that the patient do what he needs to for the issue that is causing him more stress. If he needs the injection, hold Eliquis per that physician's instructions, if he has more anxiety regarding more frequent AF, continue to take Eliquis and reschedule injection.  I advised [name] of Dr. Jackalyn Lombard recommendations- he is electing to reschedule the injection and continue Eliquis until he sees Dr. Rayann Heman on Wednesday 02/28/18. He will discuss knee replacement scheduled for September with Dr. Rayann Heman at his appointment.

## 2018-02-26 NOTE — Telephone Encounter (Signed)
Pt sent a manual transmission. Pt wants to know if he been in Afib or not because he has a procedure coming up.

## 2018-02-26 NOTE — Telephone Encounter (Signed)
[name] is having more episodes of AF and he is concerned about stopping his Eliquis today for an elective injection in his back this week. He was recently hospitalized for AF RVR and verapamil was increased. He does have an appointment with Dr. Rayann Heman Wednesday but would like the issue about Eliquis addressed today as he is supposed to stop it if he is to proceed with injection. Recent AF episodes printed for Dr. Rayann Heman.

## 2018-02-27 ENCOUNTER — Encounter: Payer: Self-pay | Admitting: Internal Medicine

## 2018-02-28 ENCOUNTER — Encounter: Payer: Self-pay | Admitting: Internal Medicine

## 2018-02-28 ENCOUNTER — Ambulatory Visit: Payer: Medicare Other | Admitting: Gastroenterology

## 2018-02-28 ENCOUNTER — Encounter

## 2018-02-28 ENCOUNTER — Ambulatory Visit (INDEPENDENT_AMBULATORY_CARE_PROVIDER_SITE_OTHER): Payer: Medicare Other | Admitting: Internal Medicine

## 2018-02-28 VITALS — BP 102/68 | HR 77 | Ht 72.0 in | Wt 242.0 lb

## 2018-02-28 DIAGNOSIS — I480 Paroxysmal atrial fibrillation: Secondary | ICD-10-CM | POA: Diagnosis not present

## 2018-02-28 DIAGNOSIS — I10 Essential (primary) hypertension: Secondary | ICD-10-CM | POA: Diagnosis not present

## 2018-02-28 MED ORDER — DILTIAZEM HCL 30 MG PO TABS: 30.0000 mg | ORAL_TABLET | Freq: Two times a day (BID) | ORAL | 3 refills | Status: DC | PRN

## 2018-02-28 MED ORDER — FLECAINIDE ACETATE 50 MG PO TABS: 50.0000 mg | ORAL_TABLET | Freq: Two times a day (BID) | ORAL | 3 refills | Status: DC

## 2018-02-28 MED ORDER — DILTIAZEM HCL 30 MG PO TABS: 30.0000 mg | ORAL_TABLET | Freq: Two times a day (BID) | ORAL | 11 refills | Status: DC | PRN

## 2018-02-28 MED ORDER — FLECAINIDE ACETATE 50 MG PO TABS: 50.0000 mg | ORAL_TABLET | Freq: Two times a day (BID) | ORAL | 0 refills | Status: DC

## 2018-02-28 NOTE — Patient Instructions (Signed)
Medication Instructions:  Your physician has recommended you make the following change in your medication:   1. Begin Flecainide, one 50 mg tablet, two times per day 2. Begin Cardizem, one 30 mg tablet, every 6 hours as needed for palpitations. You may take up to two doses per day.  Labwork: None ordered.   Testing/Procedures: None ordered.  Follow-Up: Your physician recommends that you schedule a follow-up appointment in:   1 week with Roderic Palau in the Afib clinic  6-8 weeks with Dr Rayann Heman  Any Other Special Instructions Will Be Listed Below (If Applicable).     If you need a refill on your cardiac medications before your next appointment, please call your pharmacy.

## 2018-02-28 NOTE — Progress Notes (Signed)
PCP: Unk Pinto, MD   Primary EP: Dr Coolidge Breeze is a 72 y.o. male who presents today for routine electrophysiology followup.  Since last being seen in our clinic, the patient reports doing reasonably well.  He recently presented to [facility] with atrial flutter with RVR.  His arrhythmia burden has increased.  Today, he denies symptoms of palpitations, chest pain, shortness of breath,  lower extremity edema, dizziness, presyncope, or syncope.  The patient is otherwise without complaint today.   Past Medical History:  Diagnosis Date  . Arthritis   . Benign prostatic hyperplasia   . BPH (benign prostatic hypertrophy)   . History of SCC (squamous cell carcinoma) of skin 10/04/2016  . Hyperlipidemia   . Hypertension   . IBS (irritable bowel syndrome)   . Mild acid reflux occasional  . Obesity   . Other abnormal glucose   . Pelvic pain in male   . Pre-diabetes   . Vitamin D deficiency   . Wears glasses    Past Surgical History:  Procedure Laterality Date  . ANTERIOR CERVICAL DECOMP/DISCECTOMY FUSION  01-25-2002   C4 - C5  . ANTERIOR CRUCIATE LIGAMENT REPAIR  1990   RIGHT  . APPENDECTOMY  02-07-2007  . CERVICAL FUSION  2000   C5 - 6  . COLONOSCOPY  05-30-2011   HEMORRHOIDS  . HIP SURGERY  1999   LEFT HIP --  REMOVAL CALCIUM BUILD-UP  . LEFT ANKLE SURG.  1980  . LEFT INGUINAL HERNIA REPAIR  1970  . LEFT ROTATOR CUFF REPAIR  2002  . LOOP RECORDER INSERTION N/A 09/19/2016   Procedure: Loop Recorder Insertion;  Surgeon: Thompson Grayer, MD;  Location: [facility];  Service: Cardiovascular;  Laterality: N/A;  . TENDON RECONSTRUCTION Left 07/17/2013   Procedure: LEFT ELBOW LATERAL RECONSTRUCTION;  Surgeon: Schuyler Amor, MD;  Location: [facility];  Service: Orthopedics;  Laterality: Left;  . TONSILLECTOMY    . TRIGGER FINGER RELEASE Right 07/17/2013   Procedure: RELEASE A-1 PULLEY RIGHT RING FINGER;  Surgeon: Schuyler Amor, MD;   Location: [facility];  Service: Orthopedics;  Laterality: Right;  . UMBILICAL HERNIA REPAIR  2003    ROS- all systems are reviewed and negatives except as per HPI above  Current Outpatient Medications  Medication Sig Dispense Refill  . ALPRAZolam (XANAX) 1 MG tablet Take 1/2 to 1 tablet 2 to 3 x / day ONLY if needed for Anxiety/Panic Attack & please try to limit to 5 days /week to avoid addiction 90 tablet 0  . Carboxymethylcellulose Sodium (REFRESH TEARS OP) Place 1 drop into both eyes daily as needed (for dry eyes).     . Cholecalciferol (VITAMIN D3) 5000 units CAPS Take 5,000 Units by mouth daily.     Marland Kitchen ELIQUIS 5 MG TABS tablet TAKE 1 TABLET BY MOUTH TWO  TIMES DAILY 180 tablet 1  . finasteride (PROSCAR) 5 MG tablet Take 5 mg by mouth daily.    . Flaxseed, Linseed, (FLAX SEED OIL PO) Take 1 capsule by mouth daily.    Marland Kitchen losartan (COZAAR) 50 MG tablet TAKE 1 TABLET BY MOUTH  DAILY 90 tablet 3  . Multiple Vitamin (MULTIVITAMIN) tablet Take 1 tablet by mouth daily.    . Omega-3 Fatty Acids (FISH OIL PO) Take 1 capsule by mouth 2 (two) times daily.    Marland Kitchen RAPAFLO 8 MG CAPS capsule Take 8 mg by mouth as needed (for urinary retention).     Marland Kitchen  rosuvastatin (CRESTOR) 40 MG tablet Take 1/2 to 1 tablet daily as directed. (Patient taking differently: Take 20 mg by mouth daily. ) 90 tablet 3  . tadalafil (CIALIS) 20 MG tablet Take 1 tablet (20 mg total) by mouth daily as needed for erectile dysfunction. Take 1/2 to 1 tablet by mouth daily or as directed 88 tablet 3  . verapamil (CALAN-SR) 120 MG CR tablet TAKE 1 TABLET BY MOUTH  DAILY (Patient taking differently: Take 120 mg by mouth See admin instructions. Take 120 mg by mouth at bedtime and may take an additional 60 mg once, if it feels like the heart is out of rhythm) 90 tablet 1  . vitamin C (ASCORBIC ACID) 500 MG tablet Take 500 mg by mouth daily.    . Vitamin D, Ergocalciferol, (DRISDOL) 50000 units CAPS capsule Take 50,000 Units by  mouth daily.     No current facility-administered medications for this visit.     Physical Exam: Vitals:   02/28/18 1059  BP: 102/68  Pulse: 77  SpO2: 95%  Weight: 242 lb (109.8 kg)  Height: 6' (1.829 m)    GEN- The patient is well appearing, alert and oriented x 3 today.   Head- normocephalic, atraumatic Eyes-  Sclera clear, conjunctiva pink Ears- hearing intact Oropharynx- clear Lungs- Clear to ausculation bilaterally, normal work of breathing Heart- Regular rate and rhythm, no murmurs, rubs or gallops, PMI not laterally displaced GI- soft, NT, ND, + BS Extremities- no clubbing, cyanosis, or edema  Wt Readings from Last 3 Encounters:  02/28/18 242 lb (109.8 kg)  02/21/18 234 lb 12.6 oz (106.5 kg)  01/31/18 235 lb (106.6 kg)    EKG tracing ordered today is personally reviewed and shows sinus rhythm 77 bpm, PR 150 msec, QRS 88 msec, Qtc 398 msec.  Assessment and Plan:  1. Paroxysmal atrial fibrillation/ atrial flutter (typical) Recently increased ILR is reviewed and reveals AF burden 1.7 % (0.2% last visit) chads2vasc score is 2 He is very nervous about his afib.  Lost of questions by he and his wife today. We discussed AADs and ablation at length. For now, will start flecainide 50mg  BID Will give cardizem 30mg  tabs to take 1-2 q6 hrs prn for tachyarrhythmias  2. HTN Stable No change required today  3. preop Planned to have knee surgery 03/19/18 Ok to proceed from CV standpoint.  Follow-up in AF clinic in 1 week.  Will need additional AF education as well as consideration of increased flecainide if increased arrhythmias. I will see in 6-8 weeks   Thompson Grayer MD, [facility] 02/28/2018 11:26 AM

## 2018-03-01 ENCOUNTER — Ambulatory Visit (INDEPENDENT_AMBULATORY_CARE_PROVIDER_SITE_OTHER): Payer: Medicare Other | Admitting: *Deleted

## 2018-03-01 DIAGNOSIS — I480 Paroxysmal atrial fibrillation: Secondary | ICD-10-CM | POA: Diagnosis not present

## 2018-03-02 LAB — CUP PACEART INCLINIC DEVICE CHECK
Date Time Interrogation Session: 20190823163610
Implantable Pulse Generator Implant Date: 20180312

## 2018-03-02 NOTE — Addendum Note (Signed)
Addended by: Rose Phi on: 03/02/2018 11:52 AM   Modules accepted: Orders

## 2018-03-02 NOTE — Progress Notes (Signed)
[facility] Summary Report / Loop Recorder 

## 2018-03-06 ENCOUNTER — Ambulatory Visit (HOSPITAL_COMMUNITY)
Admission: RE | Admit: 2018-03-06 | Discharge: 2018-03-06 | Disposition: A | Discharge status: Home / Self Care | Payer: Medicare Other | Source: Ambulatory Visit | Attending: Nurse Practitioner | Admitting: Nurse Practitioner

## 2018-03-06 ENCOUNTER — Encounter (HOSPITAL_COMMUNITY): Payer: Self-pay | Admitting: Nurse Practitioner

## 2018-03-06 VITALS — BP 110/62 | HR 81 | Ht 72.0 in | Wt 240.0 lb

## 2018-03-06 DIAGNOSIS — Z79899 Other long term (current) drug therapy: Secondary | ICD-10-CM | POA: Insufficient documentation

## 2018-03-06 DIAGNOSIS — E559 Vitamin D deficiency, unspecified: Secondary | ICD-10-CM | POA: Insufficient documentation

## 2018-03-06 DIAGNOSIS — Z85828 Personal history of other malignant neoplasm of skin: Secondary | ICD-10-CM | POA: Insufficient documentation

## 2018-03-06 DIAGNOSIS — K589 Irritable bowel syndrome without diarrhea: Secondary | ICD-10-CM | POA: Insufficient documentation

## 2018-03-06 DIAGNOSIS — Z6832 Body mass index (BMI) 32.0-32.9, adult: Secondary | ICD-10-CM | POA: Insufficient documentation

## 2018-03-06 DIAGNOSIS — E785 Hyperlipidemia, unspecified: Secondary | ICD-10-CM | POA: Insufficient documentation

## 2018-03-06 DIAGNOSIS — R7303 Prediabetes: Secondary | ICD-10-CM | POA: Diagnosis not present

## 2018-03-06 DIAGNOSIS — I10 Essential (primary) hypertension: Secondary | ICD-10-CM | POA: Insufficient documentation

## 2018-03-06 DIAGNOSIS — I480 Paroxysmal atrial fibrillation: Secondary | ICD-10-CM

## 2018-03-06 DIAGNOSIS — Z7901 Long term (current) use of anticoagulants: Secondary | ICD-10-CM | POA: Insufficient documentation

## 2018-03-06 DIAGNOSIS — E669 Obesity, unspecified: Secondary | ICD-10-CM | POA: Insufficient documentation

## 2018-03-06 DIAGNOSIS — I4891 Unspecified atrial fibrillation: Secondary | ICD-10-CM | POA: Insufficient documentation

## 2018-03-06 DIAGNOSIS — N400 Enlarged prostate without lower urinary tract symptoms: Secondary | ICD-10-CM | POA: Insufficient documentation

## 2018-03-06 NOTE — Progress Notes (Signed)
Primary Care Physician: Unk Pinto, MD Referring Physician: Dr. Tilford Pillar [name] is a 72 y.o. male with a h/o afib/flutter that was recently seen by Dr. Rayann Heman f/u to an ER visit for RVR. He did convert with IV BB. He was started on flecainide 50 mg bid. He was pending knee surgery on 9/9 but the surgeon's office just called pt today and wanted to reschedule his surgery for December so his heart could get straightened out. Dr. Rayann Heman on his last visit, gave pt OK to continue with surgery.   He is in SR  today and has not noted any afib since starting  flecainde.  Today, he denies symptoms of palpitations, chest pain, shortness of breath, orthopnea, PND, lower extremity edema, dizziness, presyncope, syncope, or neurologic sequela. The patient is tolerating medications without difficulties and is otherwise without complaint today.   Past Medical History:  Diagnosis Date  . Arthritis   . Benign prostatic hyperplasia   . BPH (benign prostatic hypertrophy)   . History of SCC (squamous cell carcinoma) of skin 10/04/2016  . Hyperlipidemia   . Hypertension   . IBS (irritable bowel syndrome)   . Mild acid reflux occasional  . Obesity   . Other abnormal glucose   . Pelvic pain in male   . Pre-diabetes   . Vitamin D deficiency   . Wears glasses    Past Surgical History:  Procedure Laterality Date  . ANTERIOR CERVICAL DECOMP/DISCECTOMY FUSION  01-25-2002   C4 - C5  . ANTERIOR CRUCIATE LIGAMENT REPAIR  1990   RIGHT  . APPENDECTOMY  02-07-2007  . CERVICAL FUSION  2000   C5 - 6  . COLONOSCOPY  05-30-2011   HEMORRHOIDS  . HIP SURGERY  1999   LEFT HIP --  REMOVAL CALCIUM BUILD-UP  . LEFT ANKLE SURG.  1980  . LEFT INGUINAL HERNIA REPAIR  1970  . LEFT ROTATOR CUFF REPAIR  2002  . LOOP RECORDER INSERTION N/A 09/19/2016   Procedure: Loop Recorder Insertion;  Surgeon: Thompson Grayer, MD;  Location: [facility];  Service: Cardiovascular;  Laterality: N/A;  . TENDON  RECONSTRUCTION Left 07/17/2013   Procedure: LEFT ELBOW LATERAL RECONSTRUCTION;  Surgeon: Schuyler Amor, MD;  Location: [facility];  Service: Orthopedics;  Laterality: Left;  . TONSILLECTOMY    . TRIGGER FINGER RELEASE Right 07/17/2013   Procedure: RELEASE A-1 PULLEY RIGHT RING FINGER;  Surgeon: Schuyler Amor, MD;  Location: [facility];  Service: Orthopedics;  Laterality: Right;  . UMBILICAL HERNIA REPAIR  2003    Current Outpatient Medications  Medication Sig Dispense Refill  . ALPRAZolam (XANAX) 1 MG tablet Take 1/2 to 1 tablet 2 to 3 x / day ONLY if needed for Anxiety/Panic Attack & please try to limit to 5 days /week to avoid addiction (Patient taking differently: Take 0.5 mg by mouth at bedtime. ) 90 tablet 0  . Carboxymethylcellulose Sodium (REFRESH TEARS OP) Place 1 drop into both eyes daily as needed (for dry eyes).     . Cholecalciferol (VITAMIN D3) 5000 units CAPS Take 5,000 Units by mouth daily.     Marland Kitchen ELIQUIS 5 MG TABS tablet TAKE 1 TABLET BY MOUTH TWO  TIMES DAILY 180 tablet 1  . finasteride (PROSCAR) 5 MG tablet Take 5 mg by mouth daily.    . Flaxseed, Linseed, (FLAX SEED OIL PO) Take 1 capsule by mouth daily.    . flecainide (TAMBOCOR) 50 MG tablet Take  1 tablet (50 mg total) by mouth 2 (two) times daily. 60 tablet 0  . losartan (COZAAR) 50 MG tablet TAKE 1 TABLET BY MOUTH  DAILY (Patient taking differently: Take 50 mg by mouth daily. ) 90 tablet 3  . Multiple Vitamin (MULTIVITAMIN) tablet Take 1 tablet by mouth daily.    . Omega-3 Fatty Acids (FISH OIL PO) Take 1 capsule by mouth 2 (two) times daily.    Marland Kitchen RAPAFLO 8 MG CAPS capsule Take 8 mg by mouth as needed (for urinary retention).     . rosuvastatin (CRESTOR) 40 MG tablet Take 1/2 to 1 tablet daily as directed. (Patient taking differently: Take 20 mg by mouth daily. ) 90 tablet 3  . silodosin (RAPAFLO) 4 MG CAPS capsule Take 4 mg by mouth daily with breakfast.    . tadalafil (CIALIS) 20 MG  tablet Take 1 tablet (20 mg total) by mouth daily as needed for erectile dysfunction. Take 1/2 to 1 tablet by mouth daily or as directed 88 tablet 3  . verapamil (CALAN-SR) 120 MG CR tablet TAKE 1 TABLET BY MOUTH  DAILY (Patient taking differently: Take 120 mg by mouth See admin instructions. Take 120 mg by mouth at bedtime and may take an additional 60 mg once, if it feels like the heart is out of rhythm) 90 tablet 1  . vitamin C (ASCORBIC ACID) 500 MG tablet Take 500 mg by mouth daily.    Marland Kitchen diltiazem (CARDIZEM) 30 MG tablet Take 1 tablet (30 mg total) by mouth 2 (two) times daily as needed (q6h PRN for palpitations.). (Patient not taking: Reported on 03/06/2018) 30 tablet 3   No current facility-administered medications for this encounter.     Allergies  Allergen Reactions  . Xarelto [Rivaroxaban] Other (See Comments)    Severe body aches  . Atorvastatin Other (See Comments)    Myalgias  . Clonazepam Other (See Comments)    "Made me jittery"  . Tamsulosin Hcl Other (See Comments)    Hypotension    Social History   Socioeconomic History  . Marital status: Married    Spouse name: Not on file  . Number of children: 1  . Years of education: Not on file  . Highest education level: Not on file  Occupational History  . Occupation: Personnel officer  Social Needs  . Financial resource strain: Not on file  . Food insecurity:    Worry: Not on file    Inability: Not on file  . Transportation needs:    Medical: Not on file    Non-medical: Not on file  Tobacco Use  . Smoking status: Never Smoker  . Smokeless tobacco: Never Used  Substance and Sexual Activity  . Alcohol use: Yes    Alcohol/week: 4.0 standard drinks    Types: 4 Standard drinks or equivalent per week    Comment: OCCASIONAL  . Drug use: No  . Sexual activity: Not on file  Lifestyle  . Physical activity:    Days per week: Not on file    Minutes per session: Not on file  . Stress: Not on file  Relationships  .  Social connections:    Talks on phone: Not on file    Gets together: Not on file    Attends religious service: Not on file    Active member of club or organization: Not on file    Attends meetings of clubs or organizations: Not on file    Relationship status: Not on file  .  Intimate partner violence:    Fear of current or ex partner: Not on file    Emotionally abused: Not on file    Physically abused: Not on file    Forced sexual activity: Not on file  Other Topics Concern  . Not on file  Social History Narrative  . Not on file    Family History  Problem Relation Age of Onset  . Heart disease Mother   . Stroke Mother 25  . Heart disease Father   . Diabetes Father   . Heart attack Father 20  . Alcohol abuse Father     ROS- All systems are reviewed and negative except as per the HPI above  Physical Exam: Vitals:   03/06/18 1356  BP: 110/62  Pulse: 81  Weight: 108.9 kg  Height: 6' (1.829 m)   Wt Readings from Last 3 Encounters:  03/06/18 108.9 kg  02/28/18 109.8 kg  02/21/18 106.5 kg    Labs: Lab Results  Component Value Date   NA 142 02/21/2018   K 4.0 02/21/2018   CL 107 02/21/2018   CO2 25 02/21/2018   GLUCOSE 97 02/21/2018   BUN 13 02/21/2018   CREATININE 0.80 02/21/2018   CALCIUM 10.2 02/21/2018   MG 2.0 09/12/2017   Lab Results  Component Value Date   INR 1.06 02/21/2018   Lab Results  Component Value Date   CHOL 141 01/31/2018   HDL 49 01/31/2018   LDLCALC 70 01/31/2018   TRIG 139 01/31/2018     GEN- The patient is well appearing, alert and oriented x 3 today.   Head- normocephalic, atraumatic Eyes-  Sclera clear, conjunctiva pink Ears- hearing intact Oropharynx- clear Neck- supple, no JVP Lymph- no cervical lymphadenopathy Lungs- Clear to ausculation bilaterally, normal work of breathing Heart- Regular rate and rhythm, no murmurs, rubs or gallops, PMI not laterally displaced GI- soft, NT, ND, + BS Extremities- no clubbing, cyanosis,  or edema MS- no significant deformity or atrophy Skin- no rash or lesion Psych- euthymic mood, full affect Neuro- strength and sensation are intact  EKG- SR with one PVC seen PR int 144 ms, qrs int 88 ms, qtc 425 ms     Assessment and Plan: 1. Afib Now on flecainide 50 mg bid For now seems to be keeping in SR Continue on verapamil 120 mg daily Continue on eliquis 5 mg bid  F/u with Dr. Rayann Heman as scheduled 10/16 Continue on eliquis 5 mg bid   Butch Penny C. Elzabeth Mcquerry, [facility] [address] [place],  [address] [phone]

## 2018-03-09 ENCOUNTER — Inpatient Hospital Stay (HOSPITAL_COMMUNITY): Admission: RE | Admit: 2018-03-09 | Payer: Medicare Other | Source: Ambulatory Visit

## 2018-03-15 DIAGNOSIS — M1711 Unilateral primary osteoarthritis, right knee: Secondary | ICD-10-CM | POA: Diagnosis not present

## 2018-03-15 LAB — CUP PACEART REMOTE DEVICE CHECK
Date Time Interrogation Session: 20190721013949
Implantable Pulse Generator Implant Date: 20180312

## 2018-03-19 ENCOUNTER — Inpatient Hospital Stay: Admit: 2018-03-19 | Payer: Medicare Other | Admitting: Orthopedic Surgery

## 2018-03-19 SURGERY — ARTHROPLASTY, KNEE, TOTAL
Anesthesia: Choice | Laterality: Right

## 2018-03-26 ENCOUNTER — Other Ambulatory Visit: Payer: Self-pay | Admitting: Internal Medicine

## 2018-03-26 DIAGNOSIS — G5603 Carpal tunnel syndrome, bilateral upper limbs: Secondary | ICD-10-CM | POA: Diagnosis not present

## 2018-03-26 DIAGNOSIS — M5412 Radiculopathy, cervical region: Secondary | ICD-10-CM | POA: Diagnosis not present

## 2018-03-26 DIAGNOSIS — G5623 Lesion of ulnar nerve, bilateral upper limbs: Secondary | ICD-10-CM | POA: Diagnosis not present

## 2018-03-28 ENCOUNTER — Telehealth: Payer: Self-pay | Admitting: Cardiology

## 2018-03-28 DIAGNOSIS — M5412 Radiculopathy, cervical region: Secondary | ICD-10-CM | POA: Diagnosis not present

## 2018-03-28 DIAGNOSIS — M4722 Other spondylosis with radiculopathy, cervical region: Secondary | ICD-10-CM | POA: Diagnosis not present

## 2018-03-28 DIAGNOSIS — M5020 Other cervical disc displacement, unspecified cervical region: Secondary | ICD-10-CM | POA: Diagnosis not present

## 2018-03-28 DIAGNOSIS — G5623 Lesion of ulnar nerve, bilateral upper limbs: Secondary | ICD-10-CM | POA: Diagnosis not present

## 2018-03-28 DIAGNOSIS — G5603 Carpal tunnel syndrome, bilateral upper limbs: Secondary | ICD-10-CM | POA: Diagnosis not present

## 2018-03-28 DIAGNOSIS — R200 Anesthesia of skin: Secondary | ICD-10-CM | POA: Diagnosis not present

## 2018-03-28 DIAGNOSIS — M5030 Other cervical disc degeneration, unspecified cervical region: Secondary | ICD-10-CM | POA: Diagnosis not present

## 2018-03-28 NOTE — Telephone Encounter (Signed)
LMOVM requesting that pt send manual transmission b/c home monitor has not updated in at least 14 days.    

## 2018-03-29 ENCOUNTER — Telehealth: Payer: Self-pay

## 2018-03-29 NOTE — Telephone Encounter (Signed)
   [facility] Pre-operative Risk Assessment    Request for surgical clearance:  1. What type of surgery is being performed?  Cervical fusion/hand surgery   2. When is this surgery scheduled?  TBD   3. What type of clearance is required (medical clearance vs. Pharmacy clearance to hold med vs. Both)?  Medical  4. Are there any medications that need to be held prior to surgery and how long?    5. Practice name and name of physician performing surgery?  [facility]/ Dr Sherwood Gambler  6. What is your office phone number [phone] ext. [phone]   7.   What is your office fax number [phone]  8.   Anesthesia type (None, local, MAC, general) ?    Legrand Como  Ronni Osterberg 03/29/2018, 11:22 AM  _________________________________________________________________   (provider comments below)

## 2018-03-29 NOTE — Telephone Encounter (Signed)
  Called pt to discuss cardiac clearance request. Pt stated due to recent relapse with recurrent afib, he has decided to postpone elective surgeries until after the end of the year. Next OV with Dr. Rayann Heman is in October. We will hold off on clearance for now.   Will notify requesting MD.

## 2018-04-03 ENCOUNTER — Ambulatory Visit (INDEPENDENT_AMBULATORY_CARE_PROVIDER_SITE_OTHER): Payer: Medicare Other | Admitting: *Deleted

## 2018-04-03 DIAGNOSIS — I480 Paroxysmal atrial fibrillation: Secondary | ICD-10-CM

## 2018-04-03 LAB — CUP PACEART REMOTE DEVICE CHECK
MDC IDC PG IMPLANT DT: 20180312
MDC IDC SESS DTM: 20190823020810

## 2018-04-04 NOTE — Progress Notes (Signed)
[facility] Summary Report / Loop Recorder 

## 2018-04-05 ENCOUNTER — Encounter: Payer: Self-pay | Admitting: Internal Medicine

## 2018-04-09 LAB — CUP PACEART REMOTE DEVICE CHECK
Date Time Interrogation Session: 20190925074131
MDC IDC PG IMPLANT DT: 20180312

## 2018-04-25 ENCOUNTER — Ambulatory Visit (INDEPENDENT_AMBULATORY_CARE_PROVIDER_SITE_OTHER): Payer: Medicare Other | Admitting: Internal Medicine

## 2018-04-25 ENCOUNTER — Encounter: Payer: Self-pay | Admitting: Internal Medicine

## 2018-04-25 VITALS — BP 122/72 | HR 69 | Ht 72.0 in | Wt 241.0 lb

## 2018-04-25 DIAGNOSIS — Z01810 Encounter for preprocedural cardiovascular examination: Secondary | ICD-10-CM

## 2018-04-25 DIAGNOSIS — I480 Paroxysmal atrial fibrillation: Secondary | ICD-10-CM

## 2018-04-25 DIAGNOSIS — I10 Essential (primary) hypertension: Secondary | ICD-10-CM | POA: Diagnosis not present

## 2018-04-25 NOTE — Progress Notes (Signed)
PCP: Unk Pinto, MD   Primary EP: Dr Coolidge Breeze is a 72 y.o. male who presents today for routine electrophysiology followup.  Since last being seen in our clinic, the patient reports doing very well.  Today, he denies symptoms of palpitations, chest pain, shortness of breath,  lower extremity edema, dizziness, presyncope, or syncope.  AF is well controlled.  His primary concerns are neck and knee pain.  He is riding a stationary bike and is active without CV symptoms.  The patient is otherwise without complaint today.   Past Medical History:  Diagnosis Date  . Arthritis   . Benign prostatic hyperplasia   . BPH (benign prostatic hypertrophy)   . History of SCC (squamous cell carcinoma) of skin 10/04/2016  . Hyperlipidemia   . Hypertension   . IBS (irritable bowel syndrome)   . Mild acid reflux occasional  . Obesity   . Other abnormal glucose   . Pelvic pain in male   . Pre-diabetes   . Vitamin D deficiency   . Wears glasses    Past Surgical History:  Procedure Laterality Date  . ANTERIOR CERVICAL DECOMP/DISCECTOMY FUSION  01-25-2002   C4 - C5  . ANTERIOR CRUCIATE LIGAMENT REPAIR  1990   RIGHT  . APPENDECTOMY  02-07-2007  . CERVICAL FUSION  2000   C5 - 6  . COLONOSCOPY  05-30-2011   HEMORRHOIDS  . HIP SURGERY  1999   LEFT HIP --  REMOVAL CALCIUM BUILD-UP  . LEFT ANKLE SURG.  1980  . LEFT INGUINAL HERNIA REPAIR  1970  . LEFT ROTATOR CUFF REPAIR  2002  . LOOP RECORDER INSERTION N/A 09/19/2016   Procedure: Loop Recorder Insertion;  Surgeon: Thompson Grayer, MD;  Location: [facility];  Service: Cardiovascular;  Laterality: N/A;  . TENDON RECONSTRUCTION Left 07/17/2013   Procedure: LEFT ELBOW LATERAL RECONSTRUCTION;  Surgeon: Schuyler Amor, MD;  Location: [facility];  Service: Orthopedics;  Laterality: Left;  . TONSILLECTOMY    . TRIGGER FINGER RELEASE Right 07/17/2013   Procedure: RELEASE A-1 PULLEY RIGHT RING FINGER;  Surgeon:  Schuyler Amor, MD;  Location: [facility];  Service: Orthopedics;  Laterality: Right;  . UMBILICAL HERNIA REPAIR  2003    ROS- all systems are reviewed and negatives except as per HPI above  Current Outpatient Medications  Medication Sig Dispense Refill  . ALPRAZolam (XANAX) 1 MG tablet Take 1/2 to 1 tablet 2 to 3 x / day ONLY if needed for Anxiety/Panic Attack & please try to limit to 5 days /week to avoid addiction (Patient taking differently: Take 0.5 mg by mouth at bedtime. ) 90 tablet 0  . Carboxymethylcellulose Sodium (REFRESH TEARS OP) Place 1 drop into both eyes daily as needed (for dry eyes).     . Cholecalciferol (VITAMIN D3) 5000 units CAPS Take 5,000 Units by mouth daily.     Marland Kitchen diltiazem (CARDIZEM) 30 MG tablet Take 1 tablet (30 mg total) by mouth 2 (two) times daily as needed (q6h PRN for palpitations.). 30 tablet 3  . ELIQUIS 5 MG TABS tablet TAKE 1 TABLET BY MOUTH TWO  TIMES DAILY 180 tablet 1  . finasteride (PROSCAR) 5 MG tablet Take 5 mg by mouth daily.    . Flaxseed, Linseed, (FLAX SEED OIL PO) Take 1 capsule by mouth daily.    . flecainide (TAMBOCOR) 50 MG tablet TAKE 1 TABLET BY MOUTH TWICE A DAY 180 tablet 3  . losartan (  COZAAR) 50 MG tablet TAKE 1 TABLET BY MOUTH  DAILY (Patient taking differently: Take 50 mg by mouth daily. ) 90 tablet 3  . Multiple Vitamin (MULTIVITAMIN) tablet Take 1 tablet by mouth daily.    . Omega-3 Fatty Acids (FISH OIL PO) Take 1 capsule by mouth 2 (two) times daily.    Marland Kitchen RAPAFLO 8 MG CAPS capsule Take 8 mg by mouth as needed (for urinary retention).     . rosuvastatin (CRESTOR) 40 MG tablet Take 1/2 to 1 tablet daily as directed. (Patient taking differently: Take 20 mg by mouth daily. ) 90 tablet 3  . silodosin (RAPAFLO) 4 MG CAPS capsule Take 4 mg by mouth daily with breakfast.    . tadalafil (CIALIS) 20 MG tablet Take 1 tablet (20 mg total) by mouth daily as needed for erectile dysfunction. Take 1/2 to 1 tablet by mouth daily  or as directed 88 tablet 3  . verapamil (CALAN-SR) 120 MG CR tablet TAKE 1 TABLET BY MOUTH  DAILY (Patient taking differently: Take 120 mg by mouth See admin instructions. Take 120 mg by mouth at bedtime and may take an additional 60 mg once, if it feels like the heart is out of rhythm) 90 tablet 1  . vitamin C (ASCORBIC ACID) 500 MG tablet Take 500 mg by mouth daily.     No current facility-administered medications for this visit.     Physical Exam: Vitals:   04/25/18 1359  BP: 122/72  Pulse: 69  SpO2: 97%  Weight: 241 lb (109.3 kg)  Height: 6' (1.829 m)    GEN- The patient is well appearing, alert and oriented x 3 today.   Head- normocephalic, atraumatic Eyes-  Sclera clear, conjunctiva pink Ears- hearing intact Oropharynx- clear Lungs- Clear to ausculation bilaterally, normal work of breathing Heart- Regular rate and rhythm, no murmurs, rubs or gallops, PMI not laterally displaced GI- soft, NT, ND, + BS Extremities- no clubbing, cyanosis, or edema  Wt Readings from Last 3 Encounters:  04/25/18 241 lb (109.3 kg)  03/06/18 240 lb (108.9 kg)  02/28/18 242 lb (109.8 kg)    EKG tracing ordered today is personally reviewed and shows sinus rhythm, rsr', QTC 426 msec  Echo and myoview 2018 are reviewed and are both low risk  Assessment and Plan:  1. Paroxysmal atrial fibrillation and typical atrial flutter Device interrogation reveals that his atrial arrhythmias are quiescent with flecainide (no afib since last interrogation) by ILR Continue low dose flecainide.  He cannot walk on a treadmill due to DJD to look for exercise induced arrhythmias.  Prior myoview was low risk.  No further workup planned currently. chads2vasc score is 2.  Continue on eliquis  2. HTN Stable No change required today  3. preop He is active, without ischemic symptoms.  Echo and myoview 2018 are both low risk.  Ok to proceed with knee or neck surgery without further CV testing at this time.  He  could hold eliquis for up to 5 days prior to neck surgery and 3 days prior to knee surgery if needed.   Thompson Grayer MD, [facility] 04/25/2018 2:29 PM

## 2018-04-25 NOTE — Patient Instructions (Addendum)
Medication Instructions:  Your physician recommends that you continue on your current medications as directed. Please refer to the Current Medication list given to you today.   Labwork: None ordered   Testing/Procedures: None ordered   Follow-Up: Your physician wants you to follow-up in: 6 months with Dr. Allred    Any Other Special Instructions Will Be Listed Below (If Applicable).     If you need a refill on your cardiac medications before your next appointment, please call your pharmacy.   

## 2018-05-02 ENCOUNTER — Other Ambulatory Visit: Payer: Self-pay | Admitting: Neurosurgery

## 2018-05-02 DIAGNOSIS — M542 Cervicalgia: Secondary | ICD-10-CM | POA: Diagnosis not present

## 2018-05-02 DIAGNOSIS — G5603 Carpal tunnel syndrome, bilateral upper limbs: Secondary | ICD-10-CM | POA: Diagnosis not present

## 2018-05-02 DIAGNOSIS — I10 Essential (primary) hypertension: Secondary | ICD-10-CM | POA: Diagnosis not present

## 2018-05-02 DIAGNOSIS — G5623 Lesion of ulnar nerve, bilateral upper limbs: Secondary | ICD-10-CM | POA: Diagnosis not present

## 2018-05-02 DIAGNOSIS — M4722 Other spondylosis with radiculopathy, cervical region: Secondary | ICD-10-CM | POA: Diagnosis not present

## 2018-05-02 DIAGNOSIS — M5020 Other cervical disc displacement, unspecified cervical region: Secondary | ICD-10-CM | POA: Diagnosis not present

## 2018-05-02 DIAGNOSIS — M5030 Other cervical disc degeneration, unspecified cervical region: Secondary | ICD-10-CM | POA: Diagnosis not present

## 2018-05-02 DIAGNOSIS — Z6832 Body mass index (BMI) 32.0-32.9, adult: Secondary | ICD-10-CM | POA: Diagnosis not present

## 2018-05-02 DIAGNOSIS — R200 Anesthesia of skin: Secondary | ICD-10-CM | POA: Diagnosis not present

## 2018-05-03 ENCOUNTER — Ambulatory Visit (INDEPENDENT_AMBULATORY_CARE_PROVIDER_SITE_OTHER): Payer: Medicare Other | Admitting: Internal Medicine

## 2018-05-03 ENCOUNTER — Encounter: Payer: Self-pay | Admitting: Internal Medicine

## 2018-05-03 VITALS — BP 128/76 | HR 72 | Temp 97.7°F | Resp 16 | Ht 72.5 in | Wt 248.8 lb

## 2018-05-03 DIAGNOSIS — Z79899 Other long term (current) drug therapy: Secondary | ICD-10-CM

## 2018-05-03 DIAGNOSIS — N401 Enlarged prostate with lower urinary tract symptoms: Secondary | ICD-10-CM | POA: Diagnosis not present

## 2018-05-03 DIAGNOSIS — N138 Other obstructive and reflux uropathy: Secondary | ICD-10-CM | POA: Diagnosis not present

## 2018-05-03 DIAGNOSIS — Z136 Encounter for screening for cardiovascular disorders: Secondary | ICD-10-CM

## 2018-05-03 DIAGNOSIS — Z125 Encounter for screening for malignant neoplasm of prostate: Secondary | ICD-10-CM | POA: Diagnosis not present

## 2018-05-03 DIAGNOSIS — I700 Atherosclerosis of aorta: Secondary | ICD-10-CM

## 2018-05-03 DIAGNOSIS — R7303 Prediabetes: Secondary | ICD-10-CM | POA: Diagnosis not present

## 2018-05-03 DIAGNOSIS — E782 Mixed hyperlipidemia: Secondary | ICD-10-CM | POA: Diagnosis not present

## 2018-05-03 DIAGNOSIS — M109 Gout, unspecified: Secondary | ICD-10-CM | POA: Diagnosis not present

## 2018-05-03 DIAGNOSIS — I480 Paroxysmal atrial fibrillation: Secondary | ICD-10-CM

## 2018-05-03 DIAGNOSIS — I10 Essential (primary) hypertension: Secondary | ICD-10-CM

## 2018-05-03 DIAGNOSIS — E559 Vitamin D deficiency, unspecified: Secondary | ICD-10-CM | POA: Diagnosis not present

## 2018-05-03 DIAGNOSIS — R7309 Other abnormal glucose: Secondary | ICD-10-CM

## 2018-05-03 DIAGNOSIS — Z1212 Encounter for screening for malignant neoplasm of rectum: Secondary | ICD-10-CM

## 2018-05-03 DIAGNOSIS — Z1211 Encounter for screening for malignant neoplasm of colon: Secondary | ICD-10-CM

## 2018-05-03 DIAGNOSIS — Z8249 Family history of ischemic heart disease and other diseases of the circulatory system: Secondary | ICD-10-CM

## 2018-05-03 NOTE — Patient Instructions (Signed)

## 2018-05-03 NOTE — Progress Notes (Signed)
[facility]   Unk Pinto, M.D.     Uvaldo Bristle. Silverio Lay, P.A.-C Liane Comber, [place]                [address] [place] [address] Telephone [phone] Telefax [phone] Annual  Screening/Preventative Visit  & Comprehensive Evaluation & Examination     This very nice 72 y.o. MWM presents for a Screening /Preventative Visit & comprehensive evaluation and management of multiple medical co-morbidities.  Patient has been followed for HTN, pAfib,  HLD, Prediabetes and Vitamin D Deficiency. Patient has obstructive BPH wit  LUTS improved w/Rapaflo.     HTN predates circa 2007. Patient's BP has been controlled at home.  Today's BP is at goal - 128/76. In spring 2018, patient was dx'd w/pAfib and is followed by Dr Rayann Heman.  Patient was intolerant to Xarelto and is taking Eliquis. Patient denies any cardiac symptoms as chest pain, palpitations, shortness of breath, dizziness or ankle swelling.     Patient's hyperlipidemia is controlled with diet and medications. Patient denies myalgias or other medication SE's. Last lipids were at goal albeit elevated Trig's: Lab Results  Component Value Date   CHOL 163 05/03/2018   HDL 45 05/03/2018   LDLCALC 88 05/03/2018   TRIG 207 (H) 05/03/2018   CHOLHDL 3.6 05/03/2018      Patient has hx/o Obesity (BMI 31+) and prediabetes (A1c 5.7%/2013) and patient denies reactive hypoglycemic symptoms, visual blurring, diabetic polys or paresthesias. Last A1c was Normal & at goal: Lab Results  Component Value Date   HGBA1C 5.4 05/03/2018       Finally, patient has history of Vitamin D Deficiency ("33"/2008) and last vitamin D was at goal: Lab Results  Component Value Date   VD25OH 59 05/03/2018   Current Outpatient Medications on File Prior to Visit  Medication Sig  . ALPRAZolam (XANAX) 1 MG tablet Take 1/2 to 1 tablet 2 to 3 x / day ONLY if needed for  Anxiety/Panic Attack & please try to limit to 5 days /week to avoid addiction (Patient taking differently: Take 0.5 mg by mouth at bedtime. )  . Carboxymethylcellulose Sodium (REFRESH TEARS OP) Place 1 drop into both eyes daily as needed (for dry eyes).   . Cholecalciferol (VITAMIN D3) 5000 units CAPS Take 5,000 Units by mouth daily.   Marland Kitchen diltiazem (CARDIZEM) 30 MG tablet Take 1 tablet (30 mg total) by mouth 2 (two) times daily as needed (q6h PRN for palpitations.).  Marland Kitchen ELIQUIS 5 MG TABS tablet TAKE 1 TABLET BY MOUTH TWO  TIMES DAILY  . finasteride (PROSCAR) 5 MG tablet Take 5 mg by mouth daily.  . Flaxseed, Linseed, (FLAX SEED OIL PO) Take 1 capsule by mouth daily.  . flecainide (TAMBOCOR) 50 MG tablet TAKE 1 TABLET BY MOUTH TWICE A DAY  . losartan (COZAAR) 50 MG tablet TAKE 1 TABLET BY MOUTH  DAILY (Patient taking differently: Take 50 mg by mouth daily. )  . Multiple Vitamin (MULTIVITAMIN) tablet Take 1 tablet by mouth daily.  . Omega-3 Fatty Acids (FISH OIL PO) Take 1 capsule by mouth 2 (two) times daily.  Marland Kitchen RAPAFLO 8 MG CAPS capsule Take 8 mg by mouth as needed (for urinary retention).   . rosuvastatin (CRESTOR) 40 MG tablet Take 1/2 to 1 tablet daily as directed. (Patient taking differently: Take 20 mg by mouth daily. )  .  silodosin (RAPAFLO) 4 MG CAPS capsule Take 4 mg by mouth daily with breakfast.  . tadalafil (CIALIS) 20 MG tablet Take 1 tablet (20 mg total) by mouth daily as needed for erectile dysfunction. Take 1/2 to 1 tablet by mouth daily or as directed  . verapamil (CALAN-SR) 120 MG CR tablet TAKE 1 TABLET BY MOUTH  DAILY (Patient taking differently: Take 120 mg by mouth See admin instructions. Take 120 mg by mouth at bedtime and may take an additional 60 mg once, if it feels like the heart is out of rhythm)  . vitamin C (ASCORBIC ACID) 500 MG tablet Take 500 mg by mouth daily.   No current facility-administered medications on file prior to visit.    Allergies  Allergen Reactions   . Xarelto [Rivaroxaban] Other (See Comments)    Severe body aches  . Atorvastatin Other (See Comments)    Myalgias  . Clonazepam Other (See Comments)    "Made me jittery"  . Tamsulosin Hcl Other (See Comments)    Hypotension   Past Medical History:  Diagnosis Date  . Arthritis   . Benign prostatic hyperplasia   . BPH (benign prostatic hypertrophy)   . History of SCC (squamous cell carcinoma) of skin 10/04/2016  . Hyperlipidemia   . Hypertension   . IBS (irritable bowel syndrome)   . Mild acid reflux occasional  . Obesity   . Other abnormal glucose   . Pelvic pain in male   . Pre-diabetes   . Vitamin D deficiency   . Wears glasses    Health Maintenance  Topic Date Due  . COLONOSCOPY  10/17/2016  . INFLUENZA VACCINE  02/08/2018  . Hepatitis C Screening  02/01/2019 (Originally 09/28/1945)  . TETANUS/TDAP  12/15/2025  . PNA vac Low Risk Adult  Completed   Immunization History  Administered Date(s) Administered  . DTaP 07/11/2005  . Influenza Whole 04/02/2013  . Influenza, High Dose Seasonal PF 05/01/2014, 05/21/2015, 03/29/2016, 05/31/2017  . Pneumococcal Conjugate-13 05/01/2014  . Pneumococcal Polysaccharide-23 09/08/2008, 03/29/2016  . Td 12/16/2015  . Zoster 12/09/2008   Last Colon - 08/08/2006 - Dr Deatra Ina - few tic's Flexible Sigmoidoscopy -05/30/2011 - Dr Deatra Ina - Negative                      Past Surgical History:  Procedure Laterality Date  . ANTERIOR CERVICAL DECOMP/DISCECTOMY FUSION  01-25-2002   C4 - C5  . ANTERIOR CRUCIATE LIGAMENT REPAIR  1990   RIGHT  . APPENDECTOMY  02-07-2007  . CERVICAL FUSION  2000   C5 - 6  . COLONOSCOPY  05-30-2011   HEMORRHOIDS  . HIP SURGERY  1999   LEFT HIP --  REMOVAL CALCIUM BUILD-UP  . LEFT ANKLE SURG.  1980  . LEFT INGUINAL HERNIA REPAIR  1970  . LEFT ROTATOR CUFF REPAIR  2002  . LOOP RECORDER INSERTION N/A 09/19/2016   Procedure: Loop Recorder Insertion;  Surgeon: Thompson Grayer, MD;  Location: [facility];   Service: Cardiovascular;  Laterality: N/A;  . TENDON RECONSTRUCTION Left 07/17/2013   Procedure: LEFT ELBOW LATERAL RECONSTRUCTION;  Surgeon: Schuyler Amor, MD;  Location: [facility];  Service: Orthopedics;  Laterality: Left;  . TONSILLECTOMY    . TRIGGER FINGER RELEASE Right 07/17/2013   Procedure: RELEASE A-1 PULLEY RIGHT RING FINGER;  Surgeon: Schuyler Amor, MD;  Location: [facility];  Service: Orthopedics;  Laterality: Right;  . UMBILICAL HERNIA REPAIR  2003   Family  History  Problem Relation Age of Onset  . Heart disease Mother   . Stroke Mother 86  . Heart disease Father   . Diabetes Father   . Heart attack Father 89  . Alcohol abuse Father    Social History   Occupational History  . Occupation: Personnel officer  Tobacco Use  . Smoking status: Never Smoker  . Smokeless tobacco: Never Used  Substance and Sexual Activity  . Alcohol use: Yes    Alcohol/week: 4.0 standard drinks    ROS Constitutional: Denies fever, chills, weight loss/gain, headaches, insomnia,  night sweats or change in appetite. Does c/o fatigue. Eyes: Denies redness, blurred vision, diplopia, discharge, itchy or watery eyes.  ENT: Denies discharge, congestion, post nasal drip, epistaxis, sore throat, earache, hearing loss, dental pain, Tinnitus, Vertigo, Sinus pain or snoring.  Cardio: Denies chest pain, palpitations, irregular heartbeat, syncope, dyspnea, diaphoresis, orthopnea, PND, claudication or edema Respiratory: denies cough, dyspnea, DOE, pleurisy, hoarseness, laryngitis or wheezing.  Gastrointestinal: Denies dysphagia, heartburn, reflux, water brash, pain, cramps, nausea, vomiting, bloating, diarrhea, constipation, hematemesis, melena, hematochezia, jaundice or hemorrhoids Genitourinary: Denies dysuria, frequency, discharge, hematuria or flank pain.  Has urgency, nocturia x 2-3 & occasional hesitancy. Musculoskeletal: Denies arthralgia, myalgia, stiffness, Jt.  Swelling, pain, limp or strain/sprain. Denies Falls. Skin: Denies puritis, rash, hives, warts, acne, eczema or change in skin lesion Neuro: No weakness, tremor, incoordination, spasms, paresthesia or pain Psychiatric: Denies confusion, memory loss or sensory loss. Denies Depression. Endocrine: Denies change in weight, skin, hair change, nocturia, and paresthesia, diabetic polys, visual blurring or hyper / hypo glycemic episodes.  Heme/Lymph: No excessive bleeding, bruising or enlarged lymph nodes.  Physical Exam  BP 128/76   Pulse 72   Temp 97.7 F (36.5 C)   Resp 16   Ht 6' 0.5" (1.842 m)   Wt 248 lb 12.8 oz (112.9 kg)   BMI 33.28 kg/m   General Appearance: Well nourished and well groomed and in no apparent distress.  Eyes: PERRLA, EOMs, conjunctiva no swelling or erythema, normal fundi and vessels. Sinuses: No frontal/maxillary tenderness ENT/Mouth: EACs patent / TMs  nl. Nares clear without erythema, swelling, mucoid exudates. Oral hygiene is good. No erythema, swelling, or exudate. Tongue normal, non-obstructing. Tonsils not swollen or erythematous. Hearing normal.  Neck: Supple, thyroid not palpable. No bruits, nodes or JVD. Respiratory: Respiratory effort normal.  BS equal and clear bilateral without rales, rhonci, wheezing or stridor. Cardio: Heart sounds are normal with regular rate and rhythm and no murmurs, rubs or gallops. Peripheral pulses are normal and equal bilaterally without edema. No aortic or femoral bruits. Chest: symmetric with normal excursions and percussion.  Abdomen: Soft, with Nl bowel sounds. Nontender, no guarding, rebound, hernias, masses, or organomegaly.  Lymphatics: Non tender without lymphadenopathy.  Genitourinary: No hernias.Testes nl. DRE - prostate nl for age - smooth & firm w/o nodules. Musculoskeletal: Full ROM all peripheral extremities, joint stability, 5/5 strength, and normal gait. Skin: Warm and dry without rashes, lesions, cyanosis,  clubbing or  ecchymosis.  Neuro: Cranial nerves intact, reflexes equal bilaterally. Normal muscle tone, no cerebellar symptoms. Sensation intact.  Pysch: Alert and oriented X 3 with normal affect, insight and judgment appropriate.   Assessment and Plan 1. Essential hypertension  - EKG 12-Lead - [place], RETROPERITNL ABD,  LTD - Urinalysis, Routine w reflex microscopic - Microalbumin / creatinine urine ratio - CBC with Differential/Platelet - COMPLETE METABOLIC PANEL WITH GFR - Magnesium - TSH  2. Hyperlipidemia, mixed  - EKG 12-Lead -  [place], RETROPERITNL ABD,  LTD - Lipid panel - TSH  3. Abnormal glucose  - EKG 12-Lead - [place], RETROPERITNL ABD,  LTD - Hemoglobin A1c - Insulin, random  4. Vitamin D deficiency  - VITAMIN D 25 Hydroxl  5. Pre-diabetes  - EKG 12-Lead - [place], RETROPERITNL ABD,  LTD - Hemoglobin A1c - Insulin, random  6. Paroxysmal atrial fibrillation (HCC)  - EKG 12-Lead - TSH  7. Aortic atherosclerosis (HCC)  - [place], RETROPERITNL ABD,  LTD  8. BPH with obstruction/lower urinary tract symptoms  - PSA  9. Gout, unspecified cause,  - Uric acid  10. Prostate cancer screening  - PSA  11. Screening for colorectal cancer  - POC Hemoccult Bld/Stl  12. Screening for ischemic heart disease  - EKG 12-Lead  13. FHx: heart disease  - EKG 12-Lead - [place], RETROPERITNL ABD,  LTD  14. Screening for AAA (aortic abdominal aneurysm)  - [place], RETROPERITNL ABD,  LTD  15. Medication management  - Urinalysis, Routine w reflex microscopic - Microalbumin / creatinine urine ratio - CBC with Differential/Platelet - COMPLETE METABOLIC PANEL WITH GFR - Magnesium - Lipid panel - TSH - Hemoglobin A1c - Insulin, random - VITAMIN D 25 Hydroxyl        Patient was counseled in prudent diet, weight control to achieve/maintain BMI less than 25, BP monitoring, regular exercise and medications as discussed.  Discussed med effects and SE's. Routine screening labs and  tests as requested with regular follow-up as recommended. Over 40 minutes of exam, counseling, chart review and high complex critical decision making was performed

## 2018-05-04 LAB — COMPLETE METABOLIC PANEL WITH GFR
AG RATIO: 1.8 (calc) (ref 1.0–2.5)
ALBUMIN MSPROF: 4.6 g/dL (ref 3.6–5.1)
ALT: 29 U/L (ref 9–46)
AST: 23 U/L (ref 10–35)
Alkaline phosphatase (APISO): 66 U/L (ref 40–115)
BUN: 13 mg/dL (ref 7–25)
CHLORIDE: 104 mmol/L (ref 98–110)
CO2: 26 mmol/L (ref 20–32)
Calcium: 9.9 mg/dL (ref 8.6–10.3)
Creat: 0.74 mg/dL (ref 0.70–1.18)
GFR, EST AFRICAN AMERICAN: 107 mL/min/{1.73_m2} (ref >=60)
GFR, Est Non African American: 92 mL/min/{1.73_m2} (ref >=60)
Globulin: 2.5 g/dL (calc) (ref 1.9–3.7)
Glucose, Bld: 70 mg/dL (ref 65–99)
POTASSIUM: 4 mmol/L (ref 3.5–5.3)
Sodium: 141 mmol/L (ref 135–146)
Total Bilirubin: 0.4 mg/dL (ref 0.2–1.2)
Total Protein: 7.1 g/dL (ref 6.1–8.1)

## 2018-05-04 LAB — CBC WITH DIFFERENTIAL/PLATELET
BASOS ABS: 77 {cells}/uL (ref 0–200)
Basophils Relative: 0.9 %
EOS ABS: 170 {cells}/uL (ref 15–500)
EOS PCT: 2 %
HCT: 47.1 % (ref 38.5–50.0)
HEMOGLOBIN: 16.1 g/dL (ref 13.2–17.1)
LYMPHS ABS: 1666 {cells}/uL (ref 850–3900)
MCH: 31.1 pg (ref 27.0–33.0)
MCHC: 34.2 g/dL (ref 32.0–36.0)
MCV: 90.9 fL (ref 80.0–100.0)
MPV: 10.2 fL (ref 7.5–12.5)
Monocytes Relative: 8.5 %
NEUTROS ABS: 5865 {cells}/uL (ref 1500–7800)
Neutrophils Relative %: 69 %
Platelets: 230 10*3/uL (ref 140–400)
RBC: 5.18 10*6/uL (ref 4.20–5.80)
RDW: 13 % (ref 11.0–15.0)
Total Lymphocyte: 19.6 %
WBC: 8.5 10*3/uL (ref 3.8–10.8)
WBCMIX: 723 {cells}/uL (ref 200–950)

## 2018-05-04 LAB — URIC ACID: Uric Acid, Serum: 7 mg/dL (ref 4.0–8.0)

## 2018-05-04 LAB — URINALYSIS, ROUTINE W REFLEX MICROSCOPIC
Bacteria, UA: NONE SEEN /HPF
Bilirubin Urine: NEGATIVE
Glucose, UA: NEGATIVE
HGB URINE DIPSTICK: NEGATIVE
HYALINE CAST: NONE SEEN /LPF
KETONES UR: NEGATIVE
Leukocytes, UA: NEGATIVE
Nitrite: NEGATIVE
RBC / HPF: NONE SEEN /HPF (ref 0–2)
SQUAMOUS EPITHELIAL / LPF: NONE SEEN /HPF (ref <=5)
Specific Gravity, Urine: 1.017 (ref 1.001–1.03)
pH: <=5 (ref 5.0–8.0)

## 2018-05-04 LAB — HEMOGLOBIN A1C
EAG (MMOL/L): 6 (calc)
Hgb A1c MFr Bld: 5.4 % of total Hgb (ref <5.7)
Mean Plasma Glucose: 108 (calc)

## 2018-05-04 LAB — PSA: PSA: 1.9 ng/mL (ref <=4.0)

## 2018-05-04 LAB — LIPID PANEL
CHOL/HDL RATIO: 3.6 (calc) (ref <5.0)
Cholesterol: 163 mg/dL (ref <200)
HDL: 45 mg/dL (ref >40)
LDL Cholesterol (Calc): 88 mg/dL (calc)
NON-HDL CHOLESTEROL (CALC): 118 mg/dL (ref <130)
TRIGLYCERIDES: 207 mg/dL — AB (ref <150)

## 2018-05-04 LAB — MICROALBUMIN / CREATININE URINE RATIO
Creatinine, Urine: 86 mg/dL (ref 20–320)
Microalb Creat Ratio: 99 mcg/mg creat — ABNORMAL HIGH (ref <30)
Microalb, Ur: 8.5 mg/dL

## 2018-05-04 LAB — TSH: TSH: 1.32 m[IU]/L (ref 0.40–4.50)

## 2018-05-04 LAB — INSULIN, RANDOM: Insulin: 9.4 u[IU]/mL (ref 2.0–19.6)

## 2018-05-04 LAB — MAGNESIUM: Magnesium: 1.9 mg/dL (ref 1.5–2.5)

## 2018-05-04 LAB — VITAMIN D 25 HYDROXY (VIT D DEFICIENCY, FRACTURES): Vit D, 25-Hydroxy: 59 ng/mL (ref 30–100)

## 2018-05-05 DIAGNOSIS — Z23 Encounter for immunization: Secondary | ICD-10-CM | POA: Diagnosis not present

## 2018-05-06 ENCOUNTER — Encounter: Payer: Self-pay | Admitting: Internal Medicine

## 2018-05-07 ENCOUNTER — Ambulatory Visit (INDEPENDENT_AMBULATORY_CARE_PROVIDER_SITE_OTHER): Payer: Medicare Other | Admitting: *Deleted

## 2018-05-07 ENCOUNTER — Other Ambulatory Visit: Payer: Self-pay | Admitting: *Deleted

## 2018-05-07 ENCOUNTER — Telehealth: Payer: Self-pay

## 2018-05-07 ENCOUNTER — Other Ambulatory Visit: Payer: Self-pay | Admitting: Internal Medicine

## 2018-05-07 DIAGNOSIS — I10 Essential (primary) hypertension: Secondary | ICD-10-CM | POA: Diagnosis not present

## 2018-05-07 DIAGNOSIS — I480 Paroxysmal atrial fibrillation: Secondary | ICD-10-CM

## 2018-05-07 MED ORDER — VERAPAMIL HCL ER 120 MG PO TBCR: EXTENDED_RELEASE_TABLET | ORAL | 1 refills | Status: DC

## 2018-05-07 NOTE — Telephone Encounter (Signed)
   [facility] Pre-operative Risk Assessment    Request for surgical clearance:  1. What type of surgery is being performed? Epidural   2. When is this surgery scheduled? TBD   3. What type of clearance is required (medical clearance vs. Pharmacy clearance to hold med vs. Both)? medical  4. Are there any medications that need to be held prior to surgery and how long? Eliquis- not specified   5. Practice name and name of physician performing surgery? [facility]/ Dr. Laroy Apple   6. What is your office phone number [phone]    7.   What is your office fax number [phone] Attn: X-Ray  8.   Anesthesia type (None, local, MAC, general) ?  Not specified   [name] 05/07/2018, 2:24 PM  _________________________________________________________________   (provider comments below)

## 2018-05-07 NOTE — Telephone Encounter (Signed)
   Primary Cardiologist: Thompson Grayer, MD  Chart reviewed as part of pre-operative protocol coverage. The patient was cleared by Dr. Rayann Heman 04/25/18 as below:  "He is active, without ischemic symptoms.  Echo and myoview 2018 are both low risk.  Ok to proceed with knee or neck surgery without further CV testing at this time.  He could hold eliquis for up to 5 days prior to neck surgery and 3 days prior to knee surgery if needed".   Will ask pharmacy to review anticoagulation for epidural.    Leanor Kail, PA 05/07/2018, 4:24 PM

## 2018-05-08 DIAGNOSIS — M47816 Spondylosis without myelopathy or radiculopathy, lumbar region: Secondary | ICD-10-CM | POA: Diagnosis not present

## 2018-05-08 DIAGNOSIS — M545 Low back pain: Secondary | ICD-10-CM | POA: Diagnosis not present

## 2018-05-08 NOTE — Progress Notes (Signed)
[facility] Summary Report / Loop Recorder 

## 2018-05-08 NOTE — Telephone Encounter (Signed)
Patient with diagnosis of Afib on Eliquis for anticoagulation.    Procedure: Epidural Date of procedure: TBD  CHADS2-VASc score of  3 (CHF, HTN, AGE, PreDM, stroke/tia x 2, CAD, AGE, male)  Per Dr. Rayann Heman note from 04/25/18 ok to hold Eliquis up to 5 days prior to neck surgery and 3 days prior to knee surgery. With Epidural ok to hold 3 days prior to procedure.

## 2018-05-15 ENCOUNTER — Telehealth: Payer: Self-pay | Admitting: *Deleted

## 2018-05-15 DIAGNOSIS — M1812 Unilateral primary osteoarthritis of first carpometacarpal joint, left hand: Secondary | ICD-10-CM | POA: Diagnosis not present

## 2018-05-15 DIAGNOSIS — G5603 Carpal tunnel syndrome, bilateral upper limbs: Secondary | ICD-10-CM | POA: Diagnosis not present

## 2018-05-15 DIAGNOSIS — G5621 Lesion of ulnar nerve, right upper limb: Secondary | ICD-10-CM | POA: Diagnosis not present

## 2018-05-15 NOTE — Telephone Encounter (Signed)
Patient called and plans to do the Cologuard test.  He also returned a stool sample card.  Per Dr Melford Aase, the card was resulted and was negative x 6. The patient will also do the Cologuard.

## 2018-05-21 DIAGNOSIS — Z1211 Encounter for screening for malignant neoplasm of colon: Secondary | ICD-10-CM | POA: Diagnosis not present

## 2018-05-21 DIAGNOSIS — Z1212 Encounter for screening for malignant neoplasm of rectum: Secondary | ICD-10-CM | POA: Diagnosis not present

## 2018-05-21 LAB — COLOGUARD: COLOGUARD: NEGATIVE

## 2018-05-28 ENCOUNTER — Telehealth: Payer: Self-pay

## 2018-05-28 NOTE — Telephone Encounter (Signed)
Pt has been made aware of COLOGUARD Neg results Pt voiced understanding

## 2018-05-30 LAB — CUP PACEART REMOTE DEVICE CHECK
Implantable Pulse Generator Implant Date: 20180312
MDC IDC SESS DTM: 20191028104105

## 2018-06-11 ENCOUNTER — Ambulatory Visit (INDEPENDENT_AMBULATORY_CARE_PROVIDER_SITE_OTHER): Payer: Medicare Other

## 2018-06-11 DIAGNOSIS — I480 Paroxysmal atrial fibrillation: Secondary | ICD-10-CM

## 2018-06-11 NOTE — Progress Notes (Signed)
[facility] Summary Report / Loop Recorder 

## 2018-06-13 ENCOUNTER — Encounter: Payer: Self-pay | Admitting: *Deleted

## 2018-06-13 ENCOUNTER — Telehealth: Payer: Self-pay | Admitting: *Deleted

## 2018-06-13 NOTE — Telephone Encounter (Signed)
   [facility] Pre-operative Risk Assessment    Request for surgical clearance:  1. What type of surgery is being performed? EPIDURAL   2. When is this surgery scheduled? TBD   3. What type of clearance is required (medical clearance vs. Pharmacy clearance to hold med vs. Both)? PHARMACY  4. Are there any medications that need to be held prior to surgery and how long? ELIQUIS   5. Practice name and name of physician performing surgery?  [facility]   6. What is your office phone number [phone]    7.   What is your office fax number [phone]  8.   Anesthesia type (None, local, MAC, general) ? N/A   Jeanann Lewandowsky 06/13/2018, 8:44 AM  _________________________________________________________________   (provider comments below)

## 2018-06-14 NOTE — Telephone Encounter (Signed)
   Primary Cardiologist: Thompson Grayer, MD  Chart reviewed as part of pre-operative protocol coverage. Clearance was already granted within the last 2 months (05/07/18 phone note) - would still apply per protocol. Pharmacist had reviewed chart and stated, "With Epidural ok to hold 3 days prior to procedure. " Most recent device check resulted 05/30/18 showed no arrhythmias per Dr. Jackalyn Lombard interpretation. Will route this bundled recommendation to requesting provider via Epic fax function. Please call with questions.   Charlie Pitter, PA-C 06/14/2018, 5:17 PM

## 2018-06-18 ENCOUNTER — Other Ambulatory Visit: Payer: Self-pay | Admitting: Neurosurgery

## 2018-06-18 DIAGNOSIS — M5030 Other cervical disc degeneration, unspecified cervical region: Secondary | ICD-10-CM | POA: Diagnosis not present

## 2018-06-18 DIAGNOSIS — M4722 Other spondylosis with radiculopathy, cervical region: Secondary | ICD-10-CM | POA: Diagnosis not present

## 2018-06-18 DIAGNOSIS — M5020 Other cervical disc displacement, unspecified cervical region: Secondary | ICD-10-CM | POA: Diagnosis not present

## 2018-06-18 DIAGNOSIS — Z6832 Body mass index (BMI) 32.0-32.9, adult: Secondary | ICD-10-CM | POA: Diagnosis not present

## 2018-06-18 DIAGNOSIS — M542 Cervicalgia: Secondary | ICD-10-CM | POA: Diagnosis not present

## 2018-06-18 DIAGNOSIS — R200 Anesthesia of skin: Secondary | ICD-10-CM | POA: Diagnosis not present

## 2018-06-18 DIAGNOSIS — G5623 Lesion of ulnar nerve, bilateral upper limbs: Secondary | ICD-10-CM | POA: Diagnosis not present

## 2018-06-18 DIAGNOSIS — G5603 Carpal tunnel syndrome, bilateral upper limbs: Secondary | ICD-10-CM | POA: Diagnosis not present

## 2018-06-25 DIAGNOSIS — M47816 Spondylosis without myelopathy or radiculopathy, lumbar region: Secondary | ICD-10-CM | POA: Diagnosis not present

## 2018-06-25 DIAGNOSIS — M545 Low back pain: Secondary | ICD-10-CM | POA: Diagnosis not present

## 2018-07-05 NOTE — Pre-Procedure Instructions (Signed)
[name]  07/05/2018      [id number] [place], [place] [address] [place] [address] Phone: [phone] Fax: [phone]  [place], [place] [facility] [address] [place] [address] Phone: [phone] Fax: [phone]    Your procedure is scheduled on January 6  Report to [facility] at [place].M.  Call this number if you have problems the morning of surgery:  [phone]   Remember:  Do not eat or drink after midnight.      Take these medicines the morning of surgery with A SIP OF WATER  ALPRAZolam (XANAX Eye drops if needed diltiazem (CARDIZEM) if needed finasteride (PROSCAR) silodosin (RAPAFLO)   FOLLOW PHYSICIANS INSTRUCTIONS REGARDING ELIQUIS   7 days prior to surgery STOP taking any Aspirin (unless otherwise instructed by your surgeon), Aleve, Naproxen, Ibuprofen, Motrin, Advil, Goody's, BC's, all herbal medications, fish oil, and all vitamins. diclofenac sodium (VOLTAREN)      Do not wear jewelry  Do not wear lotions, powders, or cologne, or deodorant.  Men may shave face and neck.  Do not bring valuables to the hospital.  [facility] is not responsible for any belongings or valuables.  Contacts, dentures or bridgework may not be worn into surgery.  Leave your suitcase in the car.  After surgery it may be brought to your room.  For patients admitted to the hospital, discharge time will be determined by your treatment team.  Patients discharged the day of surgery will not be allowed to drive home.    Special instructions:   [facility]- Preparing For Surgery  Before surgery, you can play an important role. Because skin is not sterile, your skin needs to be as free of germs as possible. You can reduce the number of germs on your skin by washing with CHG (chlorahexidine gluconate) Soap before surgery.  CHG is an antiseptic cleaner which  kills germs and bonds with the skin to continue killing germs even after washing.    Oral Hygiene is also important to reduce your risk of infection.  Remember - BRUSH YOUR TEETH THE MORNING OF SURGERY WITH YOUR REGULAR TOOTHPASTE  Please do not use if you have an allergy to CHG or antibacterial soaps. If your skin becomes reddened/irritated stop using the CHG.  Do not shave (including legs and underarms) for at least 48 hours prior to first CHG shower. It is OK to shave your face.  Please follow these instructions carefully.   1. Shower the NIGHT BEFORE SURGERY and the MORNING OF SURGERY with CHG.   2. If you chose to wash your hair, wash your hair first as usual with your normal shampoo.  3. After you shampoo, rinse your hair and body thoroughly to remove the shampoo.  4. Use CHG as you would any other liquid soap. You can apply CHG directly to the skin and wash gently with a scrungie or a clean washcloth.   5. Apply the CHG Soap to your body ONLY FROM THE NECK DOWN.  Do not use on open wounds or open sores. Avoid contact with your eyes, ears, mouth and genitals (private parts). Wash Face and genitals (private parts)  with your normal soap.  6. Wash thoroughly, paying special attention to the area where your surgery will be performed.  7. Thoroughly rinse your body with warm water from the neck down.  8. DO NOT shower/wash  with your normal soap after using and rinsing off the CHG Soap.  9. Pat yourself dry with a CLEAN TOWEL.  10. Wear CLEAN PAJAMAS to bed the night before surgery, wear comfortable clothes the morning of surgery  11. Place CLEAN SHEETS on your bed the night of your first shower and DO NOT SLEEP WITH PETS.    Day of Surgery:  Do not apply any deodorants/lotions.  Please wear clean clothes to the hospital/surgery center.   Remember to brush your teeth WITH YOUR REGULAR TOOTHPASTE.    Please read over the following fact sheets that you were  given.

## 2018-07-06 ENCOUNTER — Other Ambulatory Visit: Payer: Self-pay

## 2018-07-06 ENCOUNTER — Encounter (HOSPITAL_COMMUNITY)
Admission: RE | Admit: 2018-07-06 | Discharge: 2018-07-06 | Disposition: A | Discharge status: Home / Self Care | Payer: Medicare Other | Source: Ambulatory Visit | Attending: Neurosurgery | Admitting: Neurosurgery

## 2018-07-06 ENCOUNTER — Encounter (HOSPITAL_COMMUNITY): Payer: Self-pay

## 2018-07-06 DIAGNOSIS — Z01812 Encounter for preprocedural laboratory examination: Secondary | ICD-10-CM | POA: Diagnosis not present

## 2018-07-06 HISTORY — DX: Carpal tunnel syndrome, unspecified upper limb: G56.00

## 2018-07-06 HISTORY — DX: Unspecified cataract: H26.9

## 2018-07-06 HISTORY — DX: Nausea with vomiting, unspecified: R11.2

## 2018-07-06 HISTORY — DX: Other specified postprocedural states: Z98.890

## 2018-07-06 HISTORY — DX: Unspecified atrial fibrillation: I48.91

## 2018-07-06 HISTORY — DX: Carpal tunnel syndrome, bilateral upper limbs: G56.03

## 2018-07-06 HISTORY — DX: Pneumonia, unspecified organism: J18.9

## 2018-07-06 HISTORY — DX: Other cervical disc displacement, unspecified cervical region: M50.20

## 2018-07-06 LAB — BASIC METABOLIC PANEL
Anion gap: 9 (ref 5–15)
BUN: 13 mg/dL (ref 8–23)
CO2: 24 mmol/L (ref 22–32)
Calcium: 9.4 mg/dL (ref 8.9–10.3)
Chloride: 107 mmol/L (ref 98–111)
Creatinine, Ser: 0.83 mg/dL (ref 0.61–1.24)
GFR calc Af Amer: >60 mL/min (ref >60)
GFR calc non Af Amer: >60 mL/min (ref >60)
GLUCOSE: 93 mg/dL (ref 70–99)
Potassium: 4.4 mmol/L (ref 3.5–5.1)
Sodium: 140 mmol/L (ref 135–145)

## 2018-07-06 LAB — TYPE AND SCREEN
ABO/RH(D): O POS
Antibody Screen: NEGATIVE

## 2018-07-06 LAB — CBC
HCT: 50.9 % (ref 39.0–52.0)
Hemoglobin: 16.3 g/dL (ref 13.0–17.0)
MCH: 30.7 pg (ref 26.0–34.0)
MCHC: 32 g/dL (ref 30.0–36.0)
MCV: 95.9 fL (ref 80.0–100.0)
Platelets: 192 10*3/uL (ref 150–400)
RBC: 5.31 MIL/uL (ref 4.22–5.81)
RDW: 13.7 % (ref 11.5–15.5)
WBC: 9.3 10*3/uL (ref 4.0–10.5)
nRBC: 0 % (ref 0.0–0.2)

## 2018-07-06 LAB — ABO/RH: ABO/RH(D): O POS

## 2018-07-06 LAB — SURGICAL PCR SCREEN
MRSA, PCR: NEGATIVE
Staphylococcus aureus: NEGATIVE

## 2018-07-06 NOTE — Pre-Procedure Instructions (Signed)
   BASILIO MEADOW  07/06/2018     [id number] [place], [place] - [address] [place] [address] Phone: [phone] Fax: [phone]  [place], [place] [facility] [address] [place] [address] [place] [address] Phone: [phone] Fax: [phone]   Your procedure is scheduled on Monday, July 16, 2018  Report to [facility] at 5:30 A.M.  Call this number if you have problems the morning of surgery:  [phone]   Remember: Follow doctors instructions regarding ELIQUIS.  Do not eat or drink after midnight Sunday, July 15, 2018  Take these medicines the morning of surgery with A SIP OF WATER :  finasteride (PROSCAR),  flecainide (TAMBOCOR), silodosin (RAPAFLO),  If needed: diltiazem (CARDIZEM) for palpitations, Carboxymethylcellulose Sodium (REFRESH TEARS OP) for dry eyes Stop taking Aspirin, vitamins, fish oil, Flax Seed Oil and herbal medications. Do not take any NSAIDs ie: Ibuprofen, Advil, Naproxen (Aleve), Motrin, diclofenac sodium (VOLTAREN), BC and Goody Powder 7 days prior to surgery  Do not wear jewelry, make-up or nail polish.  Do not wear lotions, powders, or perfumes, or deodorant.  Do not shave 48 hours prior to surgery.  Men may shave face and neck.  Do not bring valuables to the hospital.  [facility] is not responsible for any belongings or valuables.  Contacts, dentures or bridgework may not be worn into surgery.  Leave your suitcase in the car.  After surgery it may be brought to your room. Patients discharged the day of surgery will not be allowed to drive home.  Special instructions: See "[facility]- Preparing For Surgery " sheet. Please read over the following fact sheets that you were given. Pain Booklet, Coughing and Deep Breathing, MRSA Information and Surgical Site Infection Prevention

## 2018-07-06 NOTE — Progress Notes (Signed)
Pt denies SOB and chest pain. Pt stated that he is under the care of Dr. Rayann Heman, Cardiology. Pt denies having a cardiac cath. Pt stated that he was instructed to stop taking Eliquis 3 days prior to surgery " I have the day on my calendar at home." Pt has a Loop Recorder. Pt verbalized understanding of all pre-op instructions. Pt chart forwarded to PA, Anesthesiology, to review cardiac clearance note in Epic.

## 2018-07-09 ENCOUNTER — Other Ambulatory Visit: Payer: Self-pay | Admitting: Internal Medicine

## 2018-07-09 MED ORDER — ALPRAZOLAM 1 MG PO TABS: ORAL_TABLET | ORAL | 0 refills | Status: DC

## 2018-07-09 NOTE — Anesthesia Preprocedure Evaluation (Addendum)
Anesthesia Evaluation  Patient identified by MRN, date of birth, ID band Patient awake    Reviewed: Allergy & Precautions, NPO status   History of Anesthesia Complications (+) PONV  Airway Mallampati: II  TM Distance: >3 FB     Dental   Pulmonary pneumonia,    breath sounds clear to auscultation       Cardiovascular hypertension, + dysrhythmias Atrial Fibrillation (-) Valvular Problems/Murmurs Rhythm:Regular Rate:Normal     Neuro/Psych    GI/Hepatic Neg liver ROS, GERD  ,  Endo/Other  negative endocrine ROS  Renal/GU negative Renal ROS     Musculoskeletal   Abdominal   Peds  Hematology   Anesthesia Other Findings   Reproductive/Obstetrics                          Anesthesia Physical Anesthesia Plan  ASA: III  Anesthesia Plan: General   Post-op Pain Management:    Induction: Intravenous  PONV Risk Score and Plan: Ondansetron, Dexamethasone and Midazolam  Airway Management Planned: Oral ETT  Additional Equipment:   Intra-op Plan:   Post-operative Plan: Possible Post-op intubation/ventilation  Informed Consent: I have reviewed the patients History and Physical, chart, labs and discussed the procedure including the risks, benefits and alternatives for the proposed anesthesia with the patient or authorized representative who has indicated his/her understanding and acceptance.   Dental advisory given  Plan Discussed with: CRNA and Anesthesiologist  Anesthesia Plan Comments: (Follows with cardiology for paroxysmal afib. Clearance 05/07/18: "He is active, without ischemic symptoms. Echo and myoview 2018 are both low risk. Ok to proceed with knee or neck surgery without further CV testing at this time. He could hold eliquis for up to 5 days prior to neck surgery and 3 days prior to knee surgery if needed".  )      Anesthesia Quick Evaluation

## 2018-07-11 LAB — CUP PACEART REMOTE DEVICE CHECK
Implantable Pulse Generator Implant Date: 20180312
MDC IDC SESS DTM: 20191130103714

## 2018-07-12 ENCOUNTER — Ambulatory Visit (INDEPENDENT_AMBULATORY_CARE_PROVIDER_SITE_OTHER): Payer: Medicare Other

## 2018-07-12 ENCOUNTER — Other Ambulatory Visit: Payer: Self-pay | Admitting: Neurosurgery

## 2018-07-12 DIAGNOSIS — I480 Paroxysmal atrial fibrillation: Secondary | ICD-10-CM

## 2018-07-12 DIAGNOSIS — R002 Palpitations: Secondary | ICD-10-CM

## 2018-07-12 LAB — CUP PACEART REMOTE DEVICE CHECK
Implantable Pulse Generator Implant Date: 20180312
MDC IDC SESS DTM: 20200102110540

## 2018-07-13 NOTE — Progress Notes (Signed)
[facility] Summary Report / Loop Recorder 

## 2018-07-16 ENCOUNTER — Encounter (HOSPITAL_COMMUNITY): Payer: Self-pay | Admitting: Certified Registered Nurse Anesthetist

## 2018-07-16 ENCOUNTER — Ambulatory Visit (HOSPITAL_COMMUNITY): Payer: Medicare Other | Admitting: Physician Assistant

## 2018-07-16 ENCOUNTER — Ambulatory Visit (HOSPITAL_COMMUNITY): Payer: Medicare Other | Admitting: Certified Registered Nurse Anesthetist

## 2018-07-16 ENCOUNTER — Ambulatory Visit (HOSPITAL_COMMUNITY): Payer: Medicare Other

## 2018-07-16 ENCOUNTER — Encounter (HOSPITAL_COMMUNITY)
Admission: RE | Disposition: A | Discharge status: Home / Self Care | Payer: Self-pay | Source: Home / Self Care | Attending: Neurosurgery

## 2018-07-16 ENCOUNTER — Observation Stay (HOSPITAL_COMMUNITY)
Admission: RE | Admit: 2018-07-16 | Discharge: 2018-07-17 | Disposition: A | Discharge status: Home / Self Care | Payer: Medicare Other | Attending: Neurosurgery | Admitting: Neurosurgery

## 2018-07-16 DIAGNOSIS — M5011 Cervical disc disorder with radiculopathy,  high cervical region: Secondary | ICD-10-CM | POA: Diagnosis not present

## 2018-07-16 DIAGNOSIS — M4722 Other spondylosis with radiculopathy, cervical region: Secondary | ICD-10-CM | POA: Diagnosis not present

## 2018-07-16 DIAGNOSIS — I10 Essential (primary) hypertension: Secondary | ICD-10-CM | POA: Diagnosis not present

## 2018-07-16 DIAGNOSIS — R7303 Prediabetes: Secondary | ICD-10-CM | POA: Diagnosis not present

## 2018-07-16 DIAGNOSIS — Z79899 Other long term (current) drug therapy: Secondary | ICD-10-CM | POA: Diagnosis not present

## 2018-07-16 DIAGNOSIS — M4302 Spondylolysis, cervical region: Secondary | ICD-10-CM | POA: Diagnosis not present

## 2018-07-16 DIAGNOSIS — Z7901 Long term (current) use of anticoagulants: Secondary | ICD-10-CM | POA: Insufficient documentation

## 2018-07-16 DIAGNOSIS — Z419 Encounter for procedure for purposes other than remedying health state, unspecified: Secondary | ICD-10-CM

## 2018-07-16 DIAGNOSIS — M50121 Cervical disc disorder at C4-C5 level with radiculopathy: Secondary | ICD-10-CM | POA: Diagnosis not present

## 2018-07-16 DIAGNOSIS — E559 Vitamin D deficiency, unspecified: Secondary | ICD-10-CM | POA: Diagnosis not present

## 2018-07-16 DIAGNOSIS — G5603 Carpal tunnel syndrome, bilateral upper limbs: Secondary | ICD-10-CM | POA: Insufficient documentation

## 2018-07-16 DIAGNOSIS — M5020 Other cervical disc displacement, unspecified cervical region: Secondary | ICD-10-CM | POA: Diagnosis present

## 2018-07-16 DIAGNOSIS — M4322 Fusion of spine, cervical region: Secondary | ICD-10-CM | POA: Diagnosis not present

## 2018-07-16 HISTORY — PX: CARPAL TUNNEL RELEASE: SHX101

## 2018-07-16 HISTORY — PX: ANTERIOR CERVICAL DECOMP/DISCECTOMY FUSION: SHX1161

## 2018-07-16 LAB — PROTIME-INR
INR: 1
Prothrombin Time: 13.1 seconds (ref 11.4–15.2)

## 2018-07-16 SURGERY — ANTERIOR CERVICAL DECOMPRESSION/DISCECTOMY FUSION 1 LEVEL/HARDWARE REMOVAL
Anesthesia: General | Site: Wrist | Laterality: Right

## 2018-07-16 MED ORDER — HEMOSTATIC AGENTS (NO CHARGE) OPTIME
TOPICAL | Status: DC | PRN
  Administered 2018-07-16: 1

## 2018-07-16 MED ORDER — MAGNESIUM HYDROXIDE 400 MG/5ML PO SUSP: 30.0000 mL | Freq: Every day | ORAL | Status: DC | PRN

## 2018-07-16 MED ORDER — FENTANYL CITRATE (PF) 250 MCG/5ML IJ SOLN
INTRAMUSCULAR | Status: AC
  Filled 2018-07-16: qty 5

## 2018-07-16 MED ORDER — SODIUM CHLORIDE 0.9% FLUSH: 3.0000 mL | INTRAVENOUS | Status: DC | PRN

## 2018-07-16 MED ORDER — FLEET ENEMA 7-19 GM/118ML RE ENEM: 1.0000 | ENEMA | Freq: Once | RECTAL | Status: DC | PRN

## 2018-07-16 MED ORDER — SODIUM CHLORIDE 0.9 % IV SOLN
INTRAVENOUS | Status: DC | PRN
  Administered 2018-07-16: 08:00:00

## 2018-07-16 MED ORDER — DEXAMETHASONE SODIUM PHOSPHATE 10 MG/ML IJ SOLN
INTRAMUSCULAR | Status: AC
  Filled 2018-07-16: qty 1

## 2018-07-16 MED ORDER — PROPOFOL 10 MG/ML IV BOLUS
INTRAVENOUS | Status: DC | PRN
  Administered 2018-07-16: 170 mg via INTRAVENOUS

## 2018-07-16 MED ORDER — ROCURONIUM BROMIDE 50 MG/5ML IV SOSY
PREFILLED_SYRINGE | INTRAVENOUS | Status: DC | PRN
  Administered 2018-07-16 (×2): 20 mg via INTRAVENOUS
  Administered 2018-07-16: 10 mg via INTRAVENOUS
  Administered 2018-07-16: 50 mg via INTRAVENOUS

## 2018-07-16 MED ORDER — LOSARTAN POTASSIUM 25 MG PO TABS
25.0000 mg | ORAL_TABLET | Freq: Every day | ORAL | Status: DC
  Filled 2018-07-16 (×2): qty 1

## 2018-07-16 MED ORDER — CEFAZOLIN SODIUM-DEXTROSE 2-4 GM/100ML-% IV SOLN
2.0000 g | INTRAVENOUS | Status: AC
  Administered 2018-07-16: 1 g via INTRAVENOUS
  Administered 2018-07-16: 2 g via INTRAVENOUS
  Filled 2018-07-16: qty 100

## 2018-07-16 MED ORDER — LIDOCAINE-EPINEPHRINE 1 %-1:100000 IJ SOLN
INTRAMUSCULAR | Status: DC | PRN
  Administered 2018-07-16: 20 mL

## 2018-07-16 MED ORDER — THROMBIN 5000 UNITS EX SOLR
OROMUCOSAL | Status: DC | PRN
  Administered 2018-07-16: 08:00:00

## 2018-07-16 MED ORDER — FENTANYL CITRATE (PF) 100 MCG/2ML IJ SOLN
25.0000 ug | INTRAMUSCULAR | Status: DC | PRN
  Administered 2018-07-16: 50 ug via INTRAVENOUS

## 2018-07-16 MED ORDER — 0.9 % SODIUM CHLORIDE (POUR BTL) OPTIME
TOPICAL | Status: DC | PRN
  Administered 2018-07-16: 1000 mL

## 2018-07-16 MED ORDER — MIDAZOLAM HCL 2 MG/2ML IJ SOLN
INTRAMUSCULAR | Status: AC
  Filled 2018-07-16: qty 2

## 2018-07-16 MED ORDER — EPHEDRINE 5 MG/ML INJ
INTRAVENOUS | Status: AC
  Filled 2018-07-16: qty 10

## 2018-07-16 MED ORDER — PROPOFOL 10 MG/ML IV BOLUS
INTRAVENOUS | Status: AC
  Filled 2018-07-16: qty 40

## 2018-07-16 MED ORDER — LIDOCAINE-EPINEPHRINE 1 %-1:100000 IJ SOLN
INTRAMUSCULAR | Status: AC
  Filled 2018-07-16: qty 1

## 2018-07-16 MED ORDER — DILTIAZEM HCL 30 MG PO TABS
30.0000 mg | ORAL_TABLET | Freq: Two times a day (BID) | ORAL | Status: DC | PRN
  Filled 2018-07-16: qty 1

## 2018-07-16 MED ORDER — KCL IN DEXTROSE-NACL 20-5-0.45 MEQ/L-%-% IV SOLN: INTRAVENOUS | Status: DC

## 2018-07-16 MED ORDER — LIDOCAINE 2% (20 MG/ML) 5 ML SYRINGE
INTRAMUSCULAR | Status: DC | PRN
  Administered 2018-07-16: 100 mg via INTRAVENOUS

## 2018-07-16 MED ORDER — SODIUM CHLORIDE 0.9% FLUSH
3.0000 mL | Freq: Two times a day (BID) | INTRAVENOUS | Status: DC
  Administered 2018-07-16 (×2): 3 mL via INTRAVENOUS

## 2018-07-16 MED ORDER — SODIUM CHLORIDE 0.9 % IV SOLN: 250.0000 mL | INTRAVENOUS | Status: DC

## 2018-07-16 MED ORDER — ACETAMINOPHEN 10 MG/ML IV SOLN
INTRAVENOUS | Status: AC
  Filled 2018-07-16: qty 100

## 2018-07-16 MED ORDER — DEXAMETHASONE SODIUM PHOSPHATE 10 MG/ML IJ SOLN
INTRAMUSCULAR | Status: DC | PRN
  Administered 2018-07-16: 10 mg via INTRAVENOUS

## 2018-07-16 MED ORDER — HYDROCODONE-ACETAMINOPHEN 5-325 MG PO TABS
1.0000 | ORAL_TABLET | ORAL | Status: DC | PRN
  Administered 2018-07-16 – 2018-07-17 (×4): 2 via ORAL
  Filled 2018-07-16 (×2): qty 2
  Filled 2018-07-16: qty 1
  Filled 2018-07-16: qty 2
  Filled 2018-07-16: qty 1

## 2018-07-16 MED ORDER — BISACODYL 10 MG RE SUPP: 10.0000 mg | Freq: Every day | RECTAL | Status: DC | PRN

## 2018-07-16 MED ORDER — HYDROXYZINE HCL 25 MG PO TABS: 50.0000 mg | ORAL_TABLET | ORAL | Status: DC | PRN

## 2018-07-16 MED ORDER — LIDOCAINE 2% (20 MG/ML) 5 ML SYRINGE
INTRAMUSCULAR | Status: AC
  Filled 2018-07-16: qty 5

## 2018-07-16 MED ORDER — LACTATED RINGERS IV SOLN
INTRAVENOUS | Status: DC | PRN
  Administered 2018-07-16: 07:00:00 via INTRAVENOUS

## 2018-07-16 MED ORDER — FENTANYL CITRATE (PF) 100 MCG/2ML IJ SOLN
INTRAMUSCULAR | Status: AC
  Administered 2018-07-16: 50 ug via INTRAVENOUS
  Filled 2018-07-16: qty 2

## 2018-07-16 MED ORDER — KETOROLAC TROMETHAMINE 15 MG/ML IJ SOLN
INTRAMUSCULAR | Status: AC
  Filled 2018-07-16: qty 2

## 2018-07-16 MED ORDER — PHENYLEPHRINE 40 MCG/ML (10ML) SYRINGE FOR IV PUSH (FOR BLOOD PRESSURE SUPPORT)
PREFILLED_SYRINGE | INTRAVENOUS | Status: AC
  Filled 2018-07-16: qty 10

## 2018-07-16 MED ORDER — KETOROLAC TROMETHAMINE 30 MG/ML IJ SOLN
15.0000 mg | Freq: Once | INTRAMUSCULAR | Status: AC
  Administered 2018-07-16: 15 mg via INTRAVENOUS

## 2018-07-16 MED ORDER — HYDROXYZINE HCL 50 MG/ML IM SOLN: 50.0000 mg | INTRAMUSCULAR | Status: DC | PRN

## 2018-07-16 MED ORDER — VITAMIN D 25 MCG (1000 UNIT) PO TABS: 5000.0000 [IU] | ORAL_TABLET | Freq: Every day | ORAL | Status: DC

## 2018-07-16 MED ORDER — CHLORHEXIDINE GLUCONATE CLOTH 2 % EX PADS: 6.0000 | MEDICATED_PAD | Freq: Once | CUTANEOUS | Status: DC

## 2018-07-16 MED ORDER — ACETAMINOPHEN 10 MG/ML IV SOLN
INTRAVENOUS | Status: DC | PRN
  Administered 2018-07-16: 1000 mg via INTRAVENOUS

## 2018-07-16 MED ORDER — KETOROLAC TROMETHAMINE 30 MG/ML IJ SOLN
15.0000 mg | Freq: Four times a day (QID) | INTRAMUSCULAR | Status: DC
  Administered 2018-07-16 – 2018-07-17 (×3): 15 mg via INTRAVENOUS
  Filled 2018-07-16 (×3): qty 1

## 2018-07-16 MED ORDER — THROMBIN 5000 UNITS EX SOLR
CUTANEOUS | Status: AC
  Filled 2018-07-16: qty 15000

## 2018-07-16 MED ORDER — ROCURONIUM BROMIDE 50 MG/5ML IV SOSY
PREFILLED_SYRINGE | INTRAVENOUS | Status: AC
  Filled 2018-07-16: qty 5

## 2018-07-16 MED ORDER — PHENOL 1.4 % MT LIQD: 1.0000 | OROMUCOSAL | Status: DC | PRN

## 2018-07-16 MED ORDER — FENTANYL CITRATE (PF) 100 MCG/2ML IJ SOLN
INTRAMUSCULAR | Status: DC | PRN
  Administered 2018-07-16 (×3): 50 ug via INTRAVENOUS

## 2018-07-16 MED ORDER — PHENYLEPHRINE HCL 10 MG/ML IJ SOLN
INTRAMUSCULAR | Status: DC | PRN
  Administered 2018-07-16: 80 ug via INTRAVENOUS
  Administered 2018-07-16: 8 ug via INTRAVENOUS
  Administered 2018-07-16: 80 ug via INTRAVENOUS

## 2018-07-16 MED ORDER — KETOROLAC TROMETHAMINE 30 MG/ML IJ SOLN
INTRAMUSCULAR | Status: AC
  Filled 2018-07-16: qty 1

## 2018-07-16 MED ORDER — ACETAMINOPHEN 325 MG PO TABS: 650.0000 mg | ORAL_TABLET | ORAL | Status: DC | PRN

## 2018-07-16 MED ORDER — MIDAZOLAM HCL 5 MG/5ML IJ SOLN
INTRAMUSCULAR | Status: DC | PRN
  Administered 2018-07-16 (×2): 1 mg via INTRAVENOUS

## 2018-07-16 MED ORDER — ONDANSETRON HCL 4 MG/2ML IJ SOLN
INTRAMUSCULAR | Status: DC | PRN
  Administered 2018-07-16: 4 mg via INTRAVENOUS

## 2018-07-16 MED ORDER — ACETAMINOPHEN 650 MG RE SUPP: 650.0000 mg | RECTAL | Status: DC | PRN

## 2018-07-16 MED ORDER — EPHEDRINE SULFATE 50 MG/ML IJ SOLN
INTRAMUSCULAR | Status: DC | PRN
  Administered 2018-07-16 (×3): 5 mg via INTRAVENOUS

## 2018-07-16 MED ORDER — MORPHINE SULFATE (PF) 4 MG/ML IV SOLN: 4.0000 mg | INTRAVENOUS | Status: DC | PRN

## 2018-07-16 MED ORDER — ONDANSETRON HCL 4 MG/2ML IJ SOLN
INTRAMUSCULAR | Status: AC
  Filled 2018-07-16: qty 2

## 2018-07-16 MED ORDER — MENTHOL 3 MG MT LOZG: 1.0000 | LOZENGE | OROMUCOSAL | Status: DC | PRN

## 2018-07-16 MED ORDER — SODIUM CHLORIDE 0.9 % IV SOLN
INTRAVENOUS | Status: DC | PRN
  Administered 2018-07-16: 40 ug/min via INTRAVENOUS

## 2018-07-16 MED ORDER — ROSUVASTATIN CALCIUM 20 MG PO TABS: 20.0000 mg | ORAL_TABLET | Freq: Every day | ORAL | Status: DC

## 2018-07-16 MED ORDER — THROMBIN 5000 UNITS EX SOLR
CUTANEOUS | Status: DC | PRN
  Administered 2018-07-16 (×2): 5000 [IU] via TOPICAL

## 2018-07-16 MED ORDER — SUCCINYLCHOLINE CHLORIDE 200 MG/10ML IV SOSY
PREFILLED_SYRINGE | INTRAVENOUS | Status: AC
  Filled 2018-07-16: qty 10

## 2018-07-16 MED ORDER — BUPIVACAINE HCL (PF) 0.5 % IJ SOLN
INTRAMUSCULAR | Status: DC | PRN
  Administered 2018-07-16: 30 mL

## 2018-07-16 MED ORDER — VERAPAMIL HCL ER 120 MG PO TBCR
120.0000 mg | EXTENDED_RELEASE_TABLET | Freq: Every day | ORAL | Status: DC
  Administered 2018-07-16: 120 mg via ORAL
  Filled 2018-07-16: qty 1

## 2018-07-16 MED ORDER — ALUM & MAG HYDROXIDE-SIMETH 200-200-20 MG/5ML PO SUSP: 30.0000 mL | Freq: Four times a day (QID) | ORAL | Status: DC | PRN

## 2018-07-16 MED ORDER — FINASTERIDE 5 MG PO TABS: 5.0000 mg | ORAL_TABLET | Freq: Every day | ORAL | Status: DC

## 2018-07-16 MED ORDER — CYCLOBENZAPRINE HCL 5 MG PO TABS: 5.0000 mg | ORAL_TABLET | Freq: Three times a day (TID) | ORAL | Status: DC | PRN

## 2018-07-16 MED ORDER — VITAMIN C 500 MG PO TABS: 500.0000 mg | ORAL_TABLET | Freq: Every day | ORAL | Status: DC

## 2018-07-16 MED ORDER — SUGAMMADEX SODIUM 500 MG/5ML IV SOLN
INTRAVENOUS | Status: DC | PRN
  Administered 2018-07-16: 222.4 mg via INTRAVENOUS

## 2018-07-16 MED ORDER — BUPIVACAINE HCL (PF) 0.5 % IJ SOLN
INTRAMUSCULAR | Status: AC
  Filled 2018-07-16: qty 30

## 2018-07-16 MED ORDER — SUCCINYLCHOLINE 20MG/ML (10ML) SYRINGE FOR MEDFUSION PUMP - OPTIME
INTRAMUSCULAR | Status: DC | PRN
  Administered 2018-07-16: 120 mg via INTRAVENOUS

## 2018-07-16 SURGICAL SUPPLY — 74 items
ADH SKN CLS APL DERMABOND .7 (GAUZE/BANDAGES/DRESSINGS) ×2
ALLOGRAFT CA 6X14X11 (Bone Implant) ×1 IMPLANT
BAG DECANTER FOR FLEXI CONT (MISCELLANEOUS) ×3 IMPLANT
BIT DRILL 14X2.5XNS TI ANT (BIT) IMPLANT
BIT DRILL AVIATOR 14 (BIT) ×3
BIT DRILL NEURO 2X3.1 SFT TUCH (MISCELLANEOUS) ×2 IMPLANT
BIT DRL 14X2.5XNS TI ANT (BIT) ×2
BLADE SURG 15 STRL LF DISP TIS (BLADE) ×4 IMPLANT
BLADE SURG 15 STRL SS (BLADE) ×12
BLADE ULTRA TIP 2M (BLADE) IMPLANT
BNDG CONFORM 3 STRL LF (GAUZE/BANDAGES/DRESSINGS) ×3 IMPLANT
BNDG GAUZE ELAST 4 BULKY (GAUZE/BANDAGES/DRESSINGS) ×1 IMPLANT
BOWL CEMENT MIX W SPATULA BONE (MISCELLANEOUS) ×1 IMPLANT
CABLE BIPOLOR RESECTION CORD (MISCELLANEOUS) ×3 IMPLANT
CANISTER SUCT 3000ML PPV (MISCELLANEOUS) ×3 IMPLANT
CARTRIDGE OIL MAESTRO DRILL (MISCELLANEOUS) ×2 IMPLANT
COUNTER NEEDLE 20 DBL MAG RED (NEEDLE) ×1 IMPLANT
COVER MAYO STAND STRL (DRAPES) ×3 IMPLANT
COVER WAND RF STERILE (DRAPES) ×6 IMPLANT
DECANTER SPIKE VIAL GLASS SM (MISCELLANEOUS) ×3 IMPLANT
DERMABOND ADVANCED (GAUZE/BANDAGES/DRESSINGS) ×1
DERMABOND ADVANCED .7 DNX12 (GAUZE/BANDAGES/DRESSINGS) ×2 IMPLANT
DIFFUSER DRILL AIR PNEUMATIC (MISCELLANEOUS) ×3 IMPLANT
DRAPE EXTREMITY T 121X128X90 (DISPOSABLE) ×3 IMPLANT
DRAPE HALF SHEET 40X57 (DRAPES) ×3 IMPLANT
DRAPE LAPAROTOMY 100X72 PEDS (DRAPES) ×3 IMPLANT
DRAPE MICROSCOPE LEICA (MISCELLANEOUS) ×3 IMPLANT
DRAPE POUCH INSTRU U-SHP 10X18 (DRAPES) ×3 IMPLANT
DRILL NEURO 2X3.1 SOFT TOUCH (MISCELLANEOUS) ×3
DRSG EMULSION OIL 3X3 NADH (GAUZE/BANDAGES/DRESSINGS) ×3 IMPLANT
ELECT COATED BLADE 2.86 ST (ELECTRODE) ×3 IMPLANT
ELECT REM PT RETURN 9FT ADLT (ELECTROSURGICAL) ×3
ELECTRODE REM PT RTRN 9FT ADLT (ELECTROSURGICAL) ×2 IMPLANT
GAUZE 4X4 16PLY RFD (DISPOSABLE) ×4 IMPLANT
GAUZE SPONGE 4X4 12PLY STRL (GAUZE/BANDAGES/DRESSINGS) ×3 IMPLANT
GLOVE BIOGEL PI IND STRL 8 (GLOVE) ×4 IMPLANT
GLOVE BIOGEL PI INDICATOR 8 (GLOVE) ×2
GLOVE ECLIPSE 7.5 STRL STRAW (GLOVE) ×6 IMPLANT
GLOVE EXAM NITRILE XL STR (GLOVE) IMPLANT
GOWN STRL REUS W/ TWL LRG LVL3 (GOWN DISPOSABLE) ×2 IMPLANT
GOWN STRL REUS W/ TWL XL LVL3 (GOWN DISPOSABLE) ×6 IMPLANT
GOWN STRL REUS W/TWL 2XL LVL3 (GOWN DISPOSABLE) IMPLANT
GOWN STRL REUS W/TWL LRG LVL3 (GOWN DISPOSABLE) ×3
GOWN STRL REUS W/TWL XL LVL3 (GOWN DISPOSABLE) ×9
HALTER HD/CHIN CERV TRACTION D (MISCELLANEOUS) ×3 IMPLANT
HEMOSTAT POWDER KIT SURGIFOAM (HEMOSTASIS) ×3 IMPLANT
HEMOSTAT POWDER SURGIFOAM 1G (HEMOSTASIS) ×1 IMPLANT
KIT BASIN OR (CUSTOM PROCEDURE TRAY) ×6 IMPLANT
KIT TURNOVER KIT B (KITS) ×6 IMPLANT
NDL HYPO 25X1 1.5 SAFETY (NEEDLE) ×2 IMPLANT
NDL SPNL 22GX3.5 QUINCKE BK (NEEDLE) ×2 IMPLANT
NEEDLE HYPO 25X1 1.5 SAFETY (NEEDLE) ×3 IMPLANT
NEEDLE SPNL 22GX3.5 QUINCKE BK (NEEDLE) ×3 IMPLANT
NS IRRIG 1000ML POUR BTL (IV SOLUTION) ×3 IMPLANT
OIL CARTRIDGE MAESTRO DRILL (MISCELLANEOUS) ×3
PACK LAMINECTOMY NEURO (CUSTOM PROCEDURE TRAY) ×3 IMPLANT
PACK SURGICAL SETUP 50X90 (CUSTOM PROCEDURE TRAY) ×3 IMPLANT
PAD ARMBOARD 7.5X6 YLW CONV (MISCELLANEOUS) ×18 IMPLANT
PLATE AVIATOR ASSY 1LVL SZ 14 (Plate) ×1 IMPLANT
RUBBERBAND STERILE (MISCELLANEOUS) ×6 IMPLANT
SCREW AVIAT VAR SLFTAP 4.35X14 (Screw) ×2 IMPLANT
SCREW AVIATOR VAR SELFTAP 4X14 (Screw) ×2 IMPLANT
SLING ARM FOAM STRAP LRG (SOFTGOODS) ×3 IMPLANT
SPONGE INTESTINAL PEANUT (DISPOSABLE) ×3 IMPLANT
SPONGE SURGIFOAM ABS GEL SZ50 (HEMOSTASIS) ×1 IMPLANT
STAPLER SKIN PROX WIDE 3.9 (STAPLE) IMPLANT
STOCKINETTE 4X48 STRL (DRAPES) ×3 IMPLANT
SUT ETHILON 4 0 PS 2 18 (SUTURE) ×3 IMPLANT
SUT VIC AB 2-0 CP2 18 (SUTURE) ×3 IMPLANT
SUT VIC AB 3-0 SH 8-18 (SUTURE) ×6 IMPLANT
TOWEL GREEN STERILE (TOWEL DISPOSABLE) ×3 IMPLANT
TOWEL GREEN STERILE FF (TOWEL DISPOSABLE) ×10 IMPLANT
UNDERPAD 30X30 (UNDERPADS AND DIAPERS) ×3 IMPLANT
WATER STERILE IRR 1000ML POUR (IV SOLUTION) ×3 IMPLANT

## 2018-07-16 NOTE — Progress Notes (Signed)
Vitals:   07/16/18 1156 07/16/18 1205 07/16/18 1217 07/16/18 1621  BP: 124/71  129/86 (!) 141/76  Pulse: 65  73 75  Resp: 20  18 16   Temp:  97.7 F (36.5 C) 97.6 F (36.4 C) (!) 97.5 F (36.4 C)  TempSrc:   Oral Oral  SpO2: 93%  94% 94%    Patient resting in bed, comfortable.  Has been up and ambulating in the halls.  Voiding well.  Cervical incision clean and dry.  Right hand dressing clean and dry.  Plan: Doing well following surgery.  Encouraged to ambulate.  Continue to progress through postoperative recovery.  Hosie Spangle, MD 07/16/2018, 6:02 PM

## 2018-07-16 NOTE — Anesthesia Procedure Notes (Addendum)
Procedure Name: Intubation Date/Time: 07/16/2018 7:45 AM Performed by: Candis Shine, CRNA Pre-anesthesia Checklist: Patient identified Patient Re-evaluated:Patient Re-evaluated prior to induction Oxygen Delivery Method: Circle System Utilized Preoxygenation: Pre-oxygenation with 100% oxygen Induction Type: IV induction Ventilation: Mask ventilation without difficulty Laryngoscope Size: Mac and 4 Grade View: Grade I Tube type: Oral Tube size: 7.5 mm Number of attempts: 1 Airway Equipment and Method: Stylet Placement Confirmation: ETT inserted through vocal cords under direct vision,  positive ETCO2 and breath sounds checked- equal and bilateral Secured at: 23 cm Tube secured with: Tape Dental Injury: Teeth and Oropharynx as per pre-operative assessment  Comments: Head and neck maintained in neutral alignment during induction/intubation.

## 2018-07-16 NOTE — Op Note (Addendum)
07/16/2018  11:08 AM  PATIENT:  [name]  73 y.o. male  PRE-OPERATIVE DIAGNOSIS:  1) bilateral carpal tunnel syndrome; 2) C3-4 spondylitic cervical disc herniation cervical spondylosis, cervical degenerative disc disease, cervical radiculopathy  POST-OPERATIVE DIAGNOSIS:  1) bilateral carpal tunnel syndrome; 2) C3-4 spondylitic cervical disc herniation cervical spondylosis, cervical degenerative disc disease, cervical radiculopathy  PROCEDURE:  Procedure(s):  1) right carpal tunnel release; 2) removal of C4-5 [employer] Atlantis cervical plate; 3) C3-4 anterior cervical decompression and arthrodesis with structural allograft and aviator cervical plating  SURGEON: Jovita Gamma, MD  ASSISTANTS: Emelda Brothers, MD  ANESTHESIA:   general  EBL:  Total I/O In: -  Out: 375 [Urine:350; Blood:25]  BLOOD ADMINISTERED:none  COUNT:  Correct per nursing staff   DICTATION: Patient was brought to the operating room, placed under general tracheal anesthesia.  The right upper extremity was prepped with Betadine soap and solution, and draped in a sterile fashion.  The palm of the right hand was exposed, and the line of the incision was infiltrated local anesthetic with epinephrine.  An incision was made in the proximal palm extending distally from just distal to the distal carpal crease towards the mid palm, and just medial to the thenar crease.  Dissection was carried out to the subcutaneous tissue to the transverse carpal ligament.  The ligament was carefully divided in its full proximal to distal extent, taking care to leave the underlying structures undisturbed.  Ligament was noted to be thickened, and compressing the median nerve.  Good decompression of the median nerve was achieved.  Bipolar cautery was used to maintain hemostasis.  Once the decompression was completed and hemostasis established, we proceeded with closure.  The subcutaneous subcuticular closed with interrupted inverted 2 oh  and Vicryl sutures.  The skin and proximally with 4-0 nylon placed in interrupted vertical mattress stitch.  A dressing of Adaptic and gauze fluffs wrapped with a Kerlix was applied. We then proceeded with the anterior cervical decompression.  The right hand was positioned resting on the abdomen so as to allow for dependent drainage away from the right hand.  The patient was placed in 10 pounds of halter traction, and the neck was prepped with Betadine soap and solution and draped in sterile fashion.  The line of the incision was infiltrated local acetic with epinephrine.  Incision was made on the left side of the neck, and carried down through the subcutaneous tissue.  Bipolar cautery electrocautery used to maintain hemostasis.  Dissection was carried out through an avascular plane leaving the sternocleidomastoid, carotid artery, and jugular vein laterally and the trachea and esophagus medially.  The ventral aspect of the vertebral column was identified and the existing anterior cervical plate at the E4-5 level was identified.  We clean scar tissue off of the existing plate, and then unlocked the 2 locking screws, and then removed all 4 of the bone screws.  The screw holes were filled with Surgifoam and a plug of Gelfoam with thrombin.  The anterior cervical plate was removed.  We then verified the C3-4 intervertebral disc space.  The anterior annulus was incised, the disc space entered.  Discectomy was performed using micro-curettes and pituitary rongeurs.  Anterior osteophytic overgrowth was removed using Kerrison punches and the osteophyte removal tool.  The operating microscope was then draped and brought in the field to provide additional magnification, illumination, and visualization, and the remainder the decompression was performed using microdissection microsurgical technique.  The cartilaginous endplates of the vertebral bodies  was removed using micro-curettes along with the high-Perez drill.  Posterior  osteophytic overgrowth was removed using the high-Perez drill and then the remaining posterior ossific overgrowth along with the spondylitic disc herniation was removed using a 2 mm Kerrison punch with a thin footplate.  Good decompression of the spinal canal and thecal sac was achieved.  We then turned our attention to the neuroforamina.  Spinal overgrowth was again removed using the high-Perez drill and 2 mm Kerrison punch with a thin footplate.  Good decompression of the exiting nerve roots was achieved.  We establish hemostasis with the use of Gelfoam with thrombin, which was removed, and then a thin layer Surgifoam applied.  The intervertebral disc space height was measured, we selected a 6 mm allograft implant.  It was hydrated in saline solution, and then positioned in the intervertebral disc space and countersunk.  We then selected a 14 mm aviator cervical plate.  It was positioned over the fusion construct.  We used the existing screw holes at C4 specifically using 4.35 x 14 mm self-tapping variable screws.  Once the screws were placed, screw holes were drilled in the C3 vertebra, and we placed 4.0 x 14 mm self-tapping variable screws.  Final tightening was performed of all 4 screws and then the locking system was secured.  It was irrigated with bacitracin solution.  Hemostasis was established with the use of bipolar cautery and Gelfoam with thrombin.  Once good hemostasis was established, we proceeded with closure.  Platysma was closed interrupted inverted 2-0 undyed Vicryl sutures.  Subcutaneous and subcuticular layers were closed with interrupted inverted 3-0 Vicryl sutures.  Skin edges were approximate with Dermabond.  Patient was then taken out of traction, to be reversed in anesthetic, extubated, and transferred to the recovery room for further care.  PLAN OF CARE: Admit for overnight observation  PATIENT DISPOSITION:  PACU - hemodynamically stable.   Delay start of Pharmacological VTE agent  (>24hrs) due to surgical blood loss or risk of bleeding:  yes

## 2018-07-16 NOTE — H&P (Signed)
Subjective: Patient is a 73 y.o. right-handed white male who is admitted for treatment of carpal tunnel syndrome and cervical disc herniation, spondylosis, and degenerative disc disease.  Patient is symptomatic from both the cervical degeneration as well as the carpal tunnel syndrome.  He complains of pain, numbness, and tingling in the neck, primarily the right side, that extends into the right periauricular region.  He has good fusions at the C4-5 and C5-6 levels, but at the C3-4 level he has a broad-based disc herniation and spondylitic spurring, worse to the right than the left, with osteophytic encroachment the neuroforamina bilaterally, again right worse than left.  EMG and nerve conduction studies showed severe bilateral median neuropathy at the wrist, consistent with carpal tunnel syndrome.  He has been followed by Dr. Burney Gauze for arthritic changes in the base of the right thumb, and we discussed having Dr. Burney Gauze perform the carpal tunnel releases, however the patient discussed the situation with Dr. Burney Gauze who asked that I go ahead and do the right carpal tunnel release while he is under anesthesia for the C3-4 anterior cervical decompression and arthrodesis.  He is therefore admitted for right carpal tunnel release, removal of existing anterior cervical plate, and C3-4 anterior cervical decompression and arthrodesis.   Patient Active Problem List   Diagnosis Date Noted  . Pre-diabetes 05/03/2018  . Aortic atherosclerosis ([place]) 10/11/2017  . History of SCC (squamous cell carcinoma) of skin 10/04/2016  . Atrial fibrillation ([place]) 08/01/2016  . Screening for AAA (aortic abdominal aneurysm) 09/30/2013  . Obesity (BMI 30.0-34.9) 09/30/2013  . Essential hypertension   . Hyperlipidemia, mixed   . BPH with obstruction/lower urinary tract symptoms   . Abnormal glucose   . IBS (irritable bowel syndrome)   . Vitamin D deficiency    Past Medical History:  Diagnosis Date  . A-fib ([place])   .  Arthritis   . Benign prostatic hyperplasia   . BPH (benign prostatic hypertrophy)   . Carpal tunnel syndrome    bilateral  . Carpal tunnel syndrome, bilateral   . Early cataract   . History of SCC (squamous cell carcinoma) of skin 10/04/2016  . HNP (herniated nucleus pulposus), cervical   . Hyperlipidemia   . Hypertension   . IBS (irritable bowel syndrome)   . Mild acid reflux occasional  . Obesity   . Other abnormal glucose   . Pelvic pain in male   . Pneumonia    " walking"  . PONV (postoperative nausea and vomiting)   . Pre-diabetes   . Vitamin D deficiency   . Wears glasses     Past Surgical History:  Procedure Laterality Date  . ANTERIOR CERVICAL DECOMP/DISCECTOMY FUSION  01-25-2002   C4 - C5  . ANTERIOR CRUCIATE LIGAMENT REPAIR  1990   RIGHT  . APPENDECTOMY  02-07-2007  . CERVICAL FUSION  2000   C5 - 6  . COLONOSCOPY  05-30-2011   HEMORRHOIDS  . HERNIA REPAIR    . HIP SURGERY  1999   LEFT HIP --  REMOVAL CALCIUM BUILD-UP  . LEFT ANKLE SURG.  1980  . LEFT INGUINAL HERNIA REPAIR  1970  . LEFT ROTATOR CUFF REPAIR  2002  . LOOP RECORDER INSERTION N/A 09/19/2016   Procedure: Loop Recorder Insertion;  Surgeon: Thompson Grayer, MD;  Location: [facility];  Service: Cardiovascular;  Laterality: N/A;  . TENDON RECONSTRUCTION Left 07/17/2013   Procedure: LEFT ELBOW LATERAL RECONSTRUCTION;  Surgeon: Schuyler Amor, MD;  Location: [facility];  Service: Orthopedics;  Laterality: Left;  . TONSILLECTOMY    . TRIGGER FINGER RELEASE Right 07/17/2013   Procedure: RELEASE A-1 PULLEY RIGHT RING FINGER;  Surgeon: Schuyler Amor, MD;  Location: [facility];  Service: Orthopedics;  Laterality: Right;  . UMBILICAL HERNIA REPAIR  2003    Medications Prior to Admission  Medication Sig Dispense Refill Last Dose  . ALPRAZolam (XANAX) 1 MG tablet Take 1/2 to 1 tablet at bedtime  ONLY if needed & please try to limit to 5 days /week to avoid addiction 90  tablet 0 07/15/2018 at Unknown time  . Carboxymethylcellulose Sodium (REFRESH TEARS OP) Place 1 drop into both eyes daily as needed (for dry eyes).    07/16/2018 at Unknown time  . Cholecalciferol (VITAMIN D3) 5000 units CAPS Take 5,000 Units by mouth daily.    Past Week at Unknown time  . diclofenac sodium (VOLTAREN) 1 % GEL Apply 2 g topically daily as needed (pain).   Past Week at Unknown time  . diltiazem (CARDIZEM) 30 MG tablet Take 1 tablet (30 mg total) by mouth 2 (two) times daily as needed (q6h PRN for palpitations.). 30 tablet 3 07/16/2018 at Unknown time  . ELIQUIS 5 MG TABS tablet TAKE 1 TABLET BY MOUTH TWO  TIMES DAILY (Patient taking differently: Take 5 mg by mouth 2 (two) times daily. ) 180 tablet 1 Past Week at Unknown time  . finasteride (PROSCAR) 5 MG tablet Take 5 mg by mouth daily.   07/16/2018 at Unknown time  . Flaxseed, Linseed, (FLAX SEED OIL PO) Take 1 capsule by mouth daily.   Past Week at Unknown time  . flecainide (TAMBOCOR) 50 MG tablet TAKE 1 TABLET BY MOUTH TWICE A DAY (Patient taking differently: Take 50 mg by mouth 2 (two) times daily. ) 180 tablet 3 07/16/2018 at Unknown time  . losartan (COZAAR) 50 MG tablet TAKE 1 TABLET BY MOUTH  DAILY (Patient taking differently: Take 25 mg by mouth daily. ) 90 tablet 3 07/16/2018 at Unknown time  . Multiple Vitamin (MULTIVITAMIN) tablet Take 1 tablet by mouth daily.   Past Week at Unknown time  . Omega-3 Fatty Acids (FISH OIL PO) Take 1 capsule by mouth 2 (two) times daily.   Past Week at Unknown time  . RAPAFLO 8 MG CAPS capsule Take 8 mg by mouth as needed (for urinary retention).    Past Week at Unknown time  . rosuvastatin (CRESTOR) 20 MG tablet Take 20 mg by mouth daily.   07/16/2018 at Unknown time  . silodosin (RAPAFLO) 4 MG CAPS capsule Take 4 mg by mouth daily with breakfast.   07/16/2018 at Unknown time  . tadalafil (CIALIS) 20 MG tablet Take 1 tablet (20 mg total) by mouth daily as needed for erectile dysfunction. Take 1/2 to 1 tablet  by mouth daily or as directed 88 tablet 3 Past Week at Unknown time  . verapamil (CALAN-SR) 120 MG CR tablet Take 120 mg by mouth at bedtime or as directed. 90 tablet 1 07/15/2018 at Unknown time  . vitamin C (ASCORBIC ACID) 500 MG tablet Take 500 mg by mouth daily.   Past Week at Unknown time  . rosuvastatin (CRESTOR) 40 MG tablet Take 1/2 to 1 tablet daily as directed. (Patient not taking: Reported on 06/26/2018) 90 tablet 3 Not Taking at Unknown time   Allergies  Allergen Reactions  . Xarelto [Rivaroxaban] Other (See Comments)    Severe body aches  . Atorvastatin Other (  See Comments)    Myalgias  . Clonazepam Other (See Comments)    "Made me jittery"  . Tamsulosin Hcl Other (See Comments)    Hypotension    Social History   Tobacco Use  . Smoking status: Never Smoker  . Smokeless tobacco: Never Used  Substance Use Topics  . Alcohol use: Yes    Alcohol/week: 4.0 standard drinks    Types: 4 Standard drinks or equivalent per week    Comment: OCCASIONAL    Family History  Problem Relation Age of Onset  . Heart disease Mother   . Stroke Mother 35  . Heart disease Father   . Diabetes Father   . Heart attack Father 38  . Alcohol abuse Father      Review of Systems Pertinent items noted in HPI and remainder of comprehensive ROS otherwise negative.  Objective: Vital signs in last 24 hours: Temp:  [97.7 F (36.5 C)] 97.7 F (36.5 C) (01/06 0558) Pulse Rate:  [71] 71 (01/06 0558) BP: (151)/(86) 151/86 (01/06 0558) SpO2:  [94 %] 94 % (01/06 0558)  EXAM: Patient well-developed well-nourished white male in no acute distress.   Lungs are clear to auscultation , the patient has symmetrical respiratory excursion. Heart has a regular rate and rhythm normal S1 and S2 no murmur.   Abdomen is soft nontender nondistended bowel sounds are present. Extremity examination shows no clubbing cyanosis or edema. Neurologic examination shows on motor exam 5/5 strength in the deltoid, biceps, and  triceps bilaterally.  Finger abductors are 4/5 on the left and 5/5 in the right.  Opponens pollicis is 5/5 in the left and 4+/5 on the right.  There is atrophy of the thenar eminences bilaterally.  Sensation is intact to pinprick in the digits of the upper extremities.  Reflexes are minimal to absent in the biceps, brachioradialis, triceps, quadriceps, and gastrocnemius bilaterally.  Toes are downgoing belly.  He has normal gait, but his stance shows his head is somewhat tilted to the right.  Data Review:CBC    Component Value Date/Time   WBC 9.3 07/06/2018 1147   RBC 5.31 07/06/2018 1147   HGB 16.3 07/06/2018 1147   HCT 50.9 07/06/2018 1147   PLT 192 07/06/2018 1147   MCV 95.9 07/06/2018 1147   MCH 30.7 07/06/2018 1147   MCHC 32.0 07/06/2018 1147   RDW 13.7 07/06/2018 1147   LYMPHSABS 1,666 05/03/2018 1541   MONOABS 1.0 02/21/2018 1603   EOSABS 170 05/03/2018 1541   BASOSABS 77 05/03/2018 1541                          BMET    Component Value Date/Time   NA 140 07/06/2018 1147   K 4.4 07/06/2018 1147   CL 107 07/06/2018 1147   CO2 24 07/06/2018 1147   GLUCOSE 93 07/06/2018 1147   BUN 13 07/06/2018 1147   CREATININE 0.83 07/06/2018 1147   CREATININE 0.74 05/03/2018 1541   CALCIUM 9.4 07/06/2018 1147   GFRNONAA >60 07/06/2018 1147   GFRNONAA 92 05/03/2018 1541   GFRAA >60 07/06/2018 1147   GFRAA 107 05/03/2018 1541     Assessment/Plan: Patient with cervicalgia, cervical radiculopathy, and bilateral carpal tunnel syndrome is admitted for a right carpal tunnel release and C3-4 ACDF (with removal of existing C4-5 anterior cervical plate).  I've discussed with the patient the nature of his condition, the nature the surgical procedure, the typical length of surgery, hospital stay, and overall  recuperation. We discussed limitations postoperatively. I discussed risks of surgery including risks of infection, bleeding, possibly need for transfusion, risk of nerve and/or nerve root  dysfunction with pain, weakness, numbness, or paresthesias, the risk of spinal cord dysfunction with paralysis of all 4 limbs and quadriplegia, and the risk of dural tear and CSF leakage and possible need for further surgery, the risk of esophageal dysfunction causing dysphagia and the risk of laryngeal dysfunction causing hoarseness of the voice, the risk of failure of the arthrodesis and the possible need for further surgery, and the risk of anesthetic complications including myocardial infarction, stroke, pneumonia, and death. We also discussed the need for postoperative immobilization in a cervical collar. Understanding all this the patient does wish to proceed with surgery and is admitted for such.    Hosie Spangle, MD 07/16/2018 7:26 AM

## 2018-07-16 NOTE — Anesthesia Postprocedure Evaluation (Signed)
Anesthesia Post Note  Patient: [name]  Procedure(s) Performed: REMOVAL OF ANTERIOR CERVICAL PLATE, ANTERIOR CERVICAL DECOMPRESSION/DISCECTOMY FUSION CERVICAL THREE- CERVICAL FOUR (Bilateral Neck) CARPAL TUNNEL RELEASE (Right Wrist)     Patient location during evaluation: PACU Anesthesia Type: General Level of consciousness: awake Pain management: pain level controlled Vital Signs Assessment: post-procedure vital signs reviewed and stable Respiratory status: spontaneous breathing Cardiovascular status: stable Postop Assessment: no apparent nausea or vomiting Anesthetic complications: no    Last Vitals:  Vitals:   07/16/18 1217 07/16/18 1621  BP: 129/86 (!) 141/76  Pulse: 73 75  Resp: 18 16  Temp: 36.4 C (!) 36.4 C  SpO2: 94% 94%    Last Pain:  Vitals:   07/16/18 1653  TempSrc:   PainSc: 3                  Ikesha Siller

## 2018-07-16 NOTE — Transfer of Care (Signed)
Immediate Anesthesia Transfer of Care Note  Patient: [name]  Procedure(s) Performed: REMOVAL OF ANTERIOR CERVICAL PLATE, ANTERIOR CERVICAL DECOMPRESSION/DISCECTOMY FUSION CERVICAL THREE- CERVICAL FOUR (Bilateral Neck) CARPAL TUNNEL RELEASE (Right Wrist)  Patient Location: PACU  Anesthesia Type:General  Level of Consciousness: drowsy and responds to stimulation  Airway & Oxygen Therapy: Patient Spontanous Breathing and Patient connected to nasal cannula oxygen  Post-op Assessment: Report given to RN, Post -op Vital signs reviewed and stable and Patient moving all extremities  Post vital signs: Reviewed and stable  Last Vitals:  Vitals Value Taken Time  BP 136/80 07/16/2018 11:11 AM  Temp 36.2 C 07/16/2018 11:11 AM  Pulse 75 07/16/2018 11:16 AM  Resp 24 07/16/2018 11:16 AM  SpO2 96 % 07/16/2018 11:16 AM  Vitals shown include unvalidated device data.  Last Pain:  Vitals:   07/16/18 1111  TempSrc:   PainSc: 0-No pain         Complications: No apparent anesthesia complications

## 2018-07-17 ENCOUNTER — Encounter (HOSPITAL_COMMUNITY): Payer: Self-pay | Admitting: Neurosurgery

## 2018-07-17 DIAGNOSIS — M5011 Cervical disc disorder with radiculopathy,  high cervical region: Secondary | ICD-10-CM | POA: Diagnosis not present

## 2018-07-17 MED ORDER — HYDROCODONE-ACETAMINOPHEN 5-325 MG PO TABS: 1.0000 | ORAL_TABLET | ORAL | 0 refills | Status: DC | PRN

## 2018-07-17 NOTE — Discharge Summary (Signed)
Physician Discharge Summary  Patient ID: [name] MRN: [id number] DOB/AGE: Aug 29, 1945 73 y.o.  Admit date: 07/16/2018 Discharge date: 07/17/2018  Admission Diagnoses:  1) bilateral carpal tunnel syndrome; 2) C3-4 spondylitic cervical disc herniation cervical spondylosis, cervical degenerative disc disease, cervical radiculopathy  Discharge Diagnoses:  1) bilateral carpal tunnel syndrome; 2) C3-4 spondylitic cervical disc herniation cervical spondylosis, cervical degenerative disc disease, cervical radiculopathy Active Problems:   HNP (herniated nucleus pulposus), cervical  Discharged Condition: good  Hospital Course: Patient was admitted, underwent a right carpal tunnel release and a C3-4 ACDF (we did remove a existing C4-5 plate).  Postoperatively he has done well.  He is up and ambulating.  He is voiding well.  We are discharging him to home with instructions regarding wound care and activities.  We did the initial dressing change for his carpal tunnel surgery at the hospital.  He is scheduled for follow-up with me in 10 days at the office with an x-ray of his neck..  Discharge Exam: Blood pressure (!) 127/55, pulse 75, temperature 98.1 F (36.7 C), temperature source Oral, resp. rate 16, SpO2 96 %.  Disposition: Discharge disposition: 01-Home or Self Care       Discharge Instructions    Discharge wound care:   Complete by:  As directed    Leave the wound open to air. Shower daily with the wound uncovered. Water and soapy water should run over the incision area. Do not wash directly on the incision for 2 weeks. Remove the glue after 2 weeks.   Driving Restrictions   Complete by:  As directed    No driving for 2 weeks. May ride in the car locally now. May begin to drive locally in 2 weeks.   Other Restrictions   Complete by:  As directed    Walk gradually increasing distances out in the fresh air at least twice a day. Walking additional 6 times inside the house, gradually  increasing distances, daily. No bending, lifting, or twisting. Perform activities between shoulder and waist height (that is at counter height when standing or table height when sitting).     Allergies as of 07/17/2018      Reactions   Xarelto [rivaroxaban] Other (See Comments)   Severe body aches   Atorvastatin Other (See Comments)   Myalgias   Clonazepam Other (See Comments)   "Made me jittery"   Tamsulosin Hcl Other (See Comments)   Hypotension      Medication List    TAKE these medications   ALPRAZolam 1 MG tablet Commonly known as:  XANAX Take 1/2 to 1 tablet at bedtime  ONLY if needed & please try to limit to 5 days /week to avoid addiction   diclofenac sodium 1 % Gel Commonly known as:  VOLTAREN Apply 2 g topically daily as needed (pain).   diltiazem 30 MG tablet Commonly known as:  CARDIZEM Take 1 tablet (30 mg total) by mouth 2 (two) times daily as needed (q6h PRN for palpitations.).   ELIQUIS 5 MG Tabs tablet Generic drug:  apixaban TAKE 1 TABLET BY MOUTH TWO  TIMES DAILY What changed:  how much to take   finasteride 5 MG tablet Commonly known as:  PROSCAR Take 5 mg by mouth daily.   FISH OIL PO Take 1 capsule by mouth 2 (two) times daily.   FLAX SEED OIL PO Take 1 capsule by mouth daily.   flecainide 50 MG tablet Commonly known as:  TAMBOCOR TAKE 1 TABLET BY MOUTH TWICE  A DAY   HYDROcodone-acetaminophen 5-325 MG tablet Commonly known as:  NORCO/VICODIN Take 1-2 tablets by mouth every 4 (four) hours as needed (pain).   losartan 50 MG tablet Commonly known as:  COZAAR TAKE 1 TABLET BY MOUTH  DAILY What changed:  how much to take   multivitamin tablet Take 1 tablet by mouth daily.   silodosin 4 MG Caps capsule Commonly known as:  RAPAFLO Take 4 mg by mouth daily with breakfast.   RAPAFLO 8 MG Caps capsule Generic drug:  silodosin Take 8 mg by mouth as needed (for urinary retention).   REFRESH TEARS OP Place 1 drop into both eyes daily as  needed (for dry eyes).   rosuvastatin 20 MG tablet Commonly known as:  CRESTOR Take 20 mg by mouth daily. What changed:  Another medication with the same name was removed. Continue taking this medication, and follow the directions you see here.   tadalafil 20 MG tablet Commonly known as:  ADCIRCA/CIALIS Take 1 tablet (20 mg total) by mouth daily as needed for erectile dysfunction. Take 1/2 to 1 tablet by mouth daily or as directed   verapamil 120 MG CR tablet Commonly known as:  CALAN-SR Take 120 mg by mouth at bedtime or as directed.   vitamin C 500 MG tablet Commonly known as:  ASCORBIC ACID Take 500 mg by mouth daily.   Vitamin D3 125 MCG (5000 UT) Caps Take 5,000 Units by mouth daily.            Discharge Care Instructions  (From admission, onward)         Start     Ordered   07/17/18 0000  Discharge wound care:    Comments:  Leave the wound open to air. Shower daily with the wound uncovered. Water and soapy water should run over the incision area. Do not wash directly on the incision for 2 weeks. Remove the glue after 2 weeks.   07/17/18 0856           Signed: Hosie Spangle 07/17/2018, 8:56 AM

## 2018-07-17 NOTE — Progress Notes (Signed)
Patient alert and oriented, mae's well, voiding adequate amount of urine, swallowing without difficulty, no c/o pain at time of discharge. Patient discharged home with family. Script and discharged instructions given to patient. Patient and family stated understanding of instructions given. Patient has an appointment with Dr. Nudelman 

## 2018-07-17 NOTE — Discharge Instructions (Signed)
°  Call Your Doctor If Any of These Occur Redness, drainage, or swelling at the wound.  Temperature greater than 101 degrees. Severe pain not relieved by pain medication. Increased difficulty swallowing. Incision starts to come apart. Follow Up Appt Call today for appointment  [phone]) or for problems.  If you have any hardware placed in your spine, you will need an x-ray before your appointment.

## 2018-07-27 DIAGNOSIS — G5603 Carpal tunnel syndrome, bilateral upper limbs: Secondary | ICD-10-CM | POA: Diagnosis not present

## 2018-07-27 DIAGNOSIS — R200 Anesthesia of skin: Secondary | ICD-10-CM | POA: Diagnosis not present

## 2018-07-27 DIAGNOSIS — M5020 Other cervical disc displacement, unspecified cervical region: Secondary | ICD-10-CM | POA: Diagnosis not present

## 2018-07-27 DIAGNOSIS — G5623 Lesion of ulnar nerve, bilateral upper limbs: Secondary | ICD-10-CM | POA: Diagnosis not present

## 2018-07-27 DIAGNOSIS — Z981 Arthrodesis status: Secondary | ICD-10-CM | POA: Diagnosis not present

## 2018-07-28 ENCOUNTER — Other Ambulatory Visit: Payer: Self-pay | Admitting: Internal Medicine

## 2018-07-30 ENCOUNTER — Telehealth: Payer: Self-pay | Admitting: Cardiology

## 2018-07-30 NOTE — Telephone Encounter (Signed)
Patient called and stated that he stopped Eliquis during 07-09-2018 to 07-28-2018. He wants to know if his loop recorder showed any abnormalities during this time. Instructed pt to send a manual transmission w/ his home monitor. Informed him that the nurse will review the transmission and call him back.

## 2018-07-30 NOTE — Telephone Encounter (Signed)
Called pt to notify him of what his Linq report showed. Pt states he was concerned he was having more frequent episodes while under added stress during his surgery. He confirms he is compliant with his anticoagulation and flecainide. Pt understands to remain on current medication management and had no additional questions.

## 2018-07-30 NOTE — Telephone Encounter (Signed)
6 "AF" episodes detected by ILR during 12/30-1/18. All episodes 2 min duration, some ECGs suggest SR w/undersensed PVCs.

## 2018-08-08 NOTE — Progress Notes (Signed)
FOLLOW UP  Assessment and Plan:   P. A. Fib Rate controlled; continue flecainide, elequis Followed by Dr. Rayann Heman Reminded to go to ER for any head trauma no signs of abnormal bleeding today   Hypertension Well controlled with current medications  Monitor blood pressure at home; patient to call if consistently greater than 130/80 Continue DASH diet.   Reminder to go to the ER if any CP, SOB, nausea, dizziness, severe HA, changes vision/speech, left arm numbness and tingling and jaw pain.  Cholesterol Currently at goal; continue rosuvastatin 20 mg daily  Continue low cholesterol diet and exercise.  Check lipid panel.   Abnormal glucose Recent A1Cs at goal Discussed diet/exercise, weight management  Defer A1C; check CMP  Obesity with co morbidities Long discussion about weight loss, diet, and exercise Recommended diet heavy in fruits and veggies and low in animal meats, cheeses, and dairy products, appropriate calorie intake Discussed ideal weight for height and initial weight goal (220lb) Patient will look into low GI and mediterranean diets Will follow up in 3 months  Vitamin D Def At goal at last visit; continue supplementation to maintain goal of 70-100 Defer Vit D level  Insomnia - good sleep hygiene discussed, increase day time activity, try melatonin or benadryl in the evening to help with cutting down on benzo use.Discussed gabapentin/trazodone today but declines new script at this time  Dysphagia Likely r/t recent surgery; monitor for a few more weeks, if not improving will refer to ENT  Continue diet and meds as discussed. Further disposition pending results of labs. Discussed med's effects and SE's.   Over 30 minutes of exam, counseling, chart review, and critical decision making was performed.   Future Appointments  Date Time Provider [place]  08/14/2018 11:45 AM [facility] DEVICE REMOTES [facility]  10/29/2018  2:00 PM Allred, Jeneen Rinks,  MD [facility]  11/13/2018 10:30 AM Unk Pinto, MD [facility] None  06/10/2019  3:00 PM Unk Pinto, MD [facility] None    ----------------------------------------------------------------------------------------------------------------------  HPI 73 y.o. male  presents for 3 month follow up on hypertension, cholesterol, glucose management, weight and vitamin D deficiency. Patient was diagnosed with pAfib attributed to Decongestants for an Upper/lower Respiratory infection, followed by Dr Rayann Heman, has loop recorder, on elequis for CHADSVASC 2, controlled on flecainide. Follows with Dr. Tresa Moore for BPH. He is followed by Dr. Noemi Chapel for joint pain and had R TKA in September of 2019 and more recently in 07/2018 had anterior cervical neck surgery/revision and carpal tunnel by Dr. Sherwood Gambler and reports he is recovering well. His only concern is persisting intermittent dysphagia, struggling with anything other than very soft foods and coated medications. He also endorses some throat soreness and mild discomfort with swallowing.   he has a diagnosis of insomnia and is currently on xanax 0.5 mg at night, reports symptoms are well controlled on current regimen. he is taking most nights since surgery.   BMI is Body mass index is 32.06 kg/m., he has been working on diet and exercise. Wt Readings from Last 3 Encounters:  08/09/18 236 lb 6.4 oz (107.2 kg)  07/06/18 245 lb 3 oz (111.2 kg)  05/03/18 248 lb 12.8 oz (112.9 kg)   His blood pressure has been controlled at home, today their BP is BP: 120/64  He does workout. He denies chest pain, shortness of breath, dizziness.   He is on cholesterol medication Rosuvastatin 20 mg daily and denies myalgias. His cholesterol is at goal. The cholesterol last visit was:  Lab Results  Component Value Date   CHOL 163 05/03/2018   HDL 45 05/03/2018   LDLCALC 88 05/03/2018   TRIG 207 (H) 05/03/2018   CHOLHDL 3.6 05/03/2018    He has been working  on diet and exercise for glucose management, and denies increased appetite, nausea, paresthesia of the feet, polydipsia and polyuria. Last A1C in the office was:  Lab Results  Component Value Date   HGBA1C 5.4 05/03/2018   Patient is on Vitamin D supplement.   Lab Results  Component Value Date   VD25OH 59 05/03/2018        Current Medications:  Current Outpatient Medications on File Prior to Visit  Medication Sig  . ALPRAZolam (XANAX) 1 MG tablet Take 1/2 to 1 tablet at bedtime  ONLY if needed & please try to limit to 5 days /week to avoid addiction  . Carboxymethylcellulose Sodium (REFRESH TEARS OP) Place 1 drop into both eyes daily as needed (for dry eyes).   . Cholecalciferol (VITAMIN D3) 5000 units CAPS Take 5,000 Units by mouth daily. Take 2 tablets daily  . diclofenac sodium (VOLTAREN) 1 % GEL Apply 2 g topically daily as needed (pain).  Marland Kitchen ELIQUIS 5 MG TABS tablet TAKE 1 TABLET BY MOUTH TWO  TIMES DAILY  . finasteride (PROSCAR) 5 MG tablet Take 5 mg by mouth daily.  . Flaxseed, Linseed, (FLAX SEED OIL PO) Take 1 capsule by mouth daily.  . flecainide (TAMBOCOR) 50 MG tablet TAKE 1 TABLET BY MOUTH TWICE A DAY (Patient taking differently: Take 50 mg by mouth 2 (two) times daily. )  . losartan (COZAAR) 50 MG tablet TAKE 1 TABLET BY MOUTH  DAILY (Patient taking differently: Take 25 mg by mouth daily. )  . Multiple Vitamin (MULTIVITAMIN) tablet Take 1 tablet by mouth daily.  . Omega-3 Fatty Acids (FISH OIL PO) Take 1 capsule by mouth 2 (two) times daily.  Marland Kitchen RAPAFLO 8 MG CAPS capsule Take 8 mg by mouth as needed (for urinary retention).   . rosuvastatin (CRESTOR) 20 MG tablet Take 20 mg by mouth daily.  . silodosin (RAPAFLO) 4 MG CAPS capsule Take 4 mg by mouth as needed.   . tadalafil (CIALIS) 20 MG tablet Take 1 tablet (20 mg total) by mouth daily as needed for erectile dysfunction. Take 1/2 to 1 tablet by mouth daily or as directed  . verapamil (CALAN-SR) 120 MG CR tablet Take 120 mg  by mouth at bedtime or as directed.  . vitamin C (ASCORBIC ACID) 500 MG tablet Take 500 mg by mouth daily.  Marland Kitchen diltiazem (CARDIZEM) 30 MG tablet Take 1 tablet (30 mg total) by mouth 2 (two) times daily as needed (q6h PRN for palpitations.). (Patient not taking: Reported on 08/09/2018)   No current facility-administered medications on file prior to visit.      Allergies:  Allergies  Allergen Reactions  . Xarelto [Rivaroxaban] Other (See Comments)    Severe body aches  . Atorvastatin Other (See Comments)    Myalgias  . Clonazepam Other (See Comments)    "Made me jittery"  . Tamsulosin Hcl Other (See Comments)    Hypotension     Medical History:  Past Medical History:  Diagnosis Date  . A-fib ([place])   . Arthritis   . Benign prostatic hyperplasia   . BPH (benign prostatic hypertrophy)   . Carpal tunnel syndrome    bilateral  . Carpal tunnel syndrome, bilateral   . Early cataract   . History of SCC (  squamous cell carcinoma) of skin 10/04/2016  . HNP (herniated nucleus pulposus), cervical   . Hyperlipidemia   . Hypertension   . IBS (irritable bowel syndrome)   . Mild acid reflux occasional  . Obesity   . Other abnormal glucose   . Pelvic pain in male   . Pneumonia    " walking"  . PONV (postoperative nausea and vomiting)   . Pre-diabetes   . Vitamin D deficiency   . Wears glasses    Family history- Reviewed and unchanged Social history- Reviewed and unchanged   Review of Systems:  Review of Systems  Constitutional: Negative for malaise/fatigue and weight loss.  HENT: Positive for sore throat. Negative for hearing loss and tinnitus.        Dysphagia  Eyes: Negative for blurred vision and double vision.  Respiratory: Negative for cough, shortness of breath and wheezing.   Cardiovascular: Negative for chest pain, palpitations, orthopnea, claudication and leg swelling.  Gastrointestinal: Negative for abdominal pain, blood in stool, constipation, diarrhea, heartburn,  melena, nausea and vomiting.  Genitourinary: Negative.   Musculoskeletal: Negative for joint pain and myalgias.  Skin: Negative for rash.  Neurological: Negative for dizziness, tingling, sensory change, weakness and headaches.  Endo/Heme/Allergies: Negative for polydipsia.  Psychiatric/Behavioral: Negative.   All other systems reviewed and are negative.     Physical Exam: BP 120/64   Pulse 82   Temp (!) 97.3 F (36.3 C)   Ht 6' (1.829 m)   Wt 236 lb 6.4 oz (107.2 kg)   SpO2 97%   BMI 32.06 kg/m  Wt Readings from Last 3 Encounters:  08/09/18 236 lb 6.4 oz (107.2 kg)  07/06/18 245 lb 3 oz (111.2 kg)  05/03/18 248 lb 12.8 oz (112.9 kg)   General Appearance: Well nourished, in no apparent distress. Eyes: PERRLA, EOMs, conjunctiva no swelling or erythema Sinuses: No Frontal/maxillary tenderness ENT/Mouth: Ext aud canals clear, TMs without erythema, bulging. No erythema, swelling, or exudate on post pharynx.  Tonsils not swollen or erythematous. Hearing normal.  Neck: Supple, thyroid normal. Surgical scar healing well.  Respiratory: Respiratory effort normal, BS equal bilaterally without rales, rhonchi, wheezing or stridor.  Cardio: RRR with no MRGs. Brisk peripheral pulses without edema.  Abdomen: Soft, + BS.  Non tender, no guarding, rebound, hernias, masses. Lymphatics: Non tender without lymphadenopathy.  Musculoskeletal: Full ROM, 5/5 strength, Normal gait Skin: Warm, dry without rashes, lesions, ecchymosis.  Neuro: Cranial nerves intact. No cerebellar symptoms.  Psych: Awake and oriented X 3, normal affect, Insight and Judgment appropriate.    Izora Ribas, NP 3:03 PM [facility]

## 2018-08-09 ENCOUNTER — Encounter: Payer: Self-pay | Admitting: Adult Health

## 2018-08-09 ENCOUNTER — Ambulatory Visit (INDEPENDENT_AMBULATORY_CARE_PROVIDER_SITE_OTHER): Payer: Medicare Other | Admitting: Adult Health

## 2018-08-09 VITALS — BP 120/64 | HR 82 | Temp 97.3°F | Ht 72.0 in | Wt 236.4 lb

## 2018-08-09 DIAGNOSIS — Z79899 Other long term (current) drug therapy: Secondary | ICD-10-CM

## 2018-08-09 DIAGNOSIS — R1310 Dysphagia, unspecified: Secondary | ICD-10-CM | POA: Diagnosis not present

## 2018-08-09 DIAGNOSIS — I10 Essential (primary) hypertension: Secondary | ICD-10-CM

## 2018-08-09 DIAGNOSIS — I480 Paroxysmal atrial fibrillation: Secondary | ICD-10-CM

## 2018-08-09 DIAGNOSIS — E559 Vitamin D deficiency, unspecified: Secondary | ICD-10-CM

## 2018-08-09 DIAGNOSIS — G4700 Insomnia, unspecified: Secondary | ICD-10-CM | POA: Diagnosis not present

## 2018-08-09 DIAGNOSIS — E782 Mixed hyperlipidemia: Secondary | ICD-10-CM

## 2018-08-09 DIAGNOSIS — R7309 Other abnormal glucose: Secondary | ICD-10-CM

## 2018-08-09 DIAGNOSIS — E66811 Obesity, class 1: Secondary | ICD-10-CM

## 2018-08-09 DIAGNOSIS — E669 Obesity, unspecified: Secondary | ICD-10-CM | POA: Diagnosis not present

## 2018-08-09 NOTE — Patient Instructions (Signed)
Goals    . Exercise 150 min/wk Moderate Activity    . Weight (lb) < 220 lb (99.8 kg)      Aim for whole foods, plant based, minimally processed diet  Look into low glycemic index diet (low GI diet) and the Mediterranean diet       When it comes to diets, agreement about the perfect plan isn't easy to find, even among the experts. Experts at the [employer] developed an idea known as the Healthy Eating Plate. Just imagine a plate divided into logical, healthy portions.  The emphasis is on diet quality:  Load up on vegetables and fruits - one-half of your plate: Aim for color and variety, and remember that potatoes don't count.  Go for whole grains - one-quarter of your plate: Whole wheat, barley, wheat berries, quinoa, oats, brown rice, and foods made with them. If you want pasta, go with whole wheat pasta.  Protein power - one-quarter of your plate: Fish, chicken, beans, and nuts are all healthy, versatile protein sources. Limit red meat.  The diet, however, does go beyond the plate, offering a few other suggestions.  Use healthy plant oils, such as olive, canola, soy, corn, sunflower and peanut. Check the labels, and avoid partially hydrogenated oil, which have unhealthy trans fats.  If you're thirsty, drink water. Coffee and tea are good in moderation, but skip sugary drinks and limit milk and dairy products to one or two daily servings.  The type of carbohydrate in the diet is more important than the amount. Some sources of carbohydrates, such as vegetables, fruits, whole grains, and beans-are healthier than others.  Finally, stay active.     Dysphagia  Dysphagia is trouble swallowing. This condition occurs when solids and liquids stick in a person's throat on the way down to the stomach, or when food takes longer to get to the stomach. You may have problems swallowing food, liquids, or both. You may also have pain while trying to swallow. It may take  you more time and effort to swallow something. What are the causes? This condition is caused by:  Problems with the muscles. They may make it difficult for you to move food and liquids through the tube that connects your mouth to your stomach (esophagus). You may have ulcers, scar tissue, or inflammation that blocks the normal passage of food and liquids. Causes of these problems include: ? Acid reflux from your stomach into your esophagus (gastroesophageal reflux). ? Infections. ? Radiation treatment for cancer. ? Medicines taken without enough fluids to wash them down into your stomach.  Nerve problems. These prevent signals from being sent to the muscles of your esophagus to squeeze (contract) and move what you swallow down to your stomach.  Globus pharyngeus. This is a common problem that involves feeling like something is stuck in the throat or a sense of trouble with swallowing even though nothing is wrong with the swallowing passages.  Stroke. This can affect the nerves and make it difficult to swallow.  Certain conditions, such as cerebral palsy or Parkinson disease. What are the signs or symptoms? Common symptoms of this condition include:  A feeling that solids or liquids are stuck in your throat on the way down to the stomach.  Food taking too long to get to the stomach. Other symptoms include:  Food moving back from your stomach to your mouth (regurgitation).  Noises coming from your throat.  Chest discomfort with swallowing.  A feeling of  fullness when swallowing.  Drooling, especially when the throat is blocked.  Pain while swallowing.  Heartburn.  Coughing or gagging while trying to swallow. How is this diagnosed? This condition is diagnosed by:  Barium X-ray. In this test, you swallow a white substance (contrast medium)that sticks to the inside of your esophagus. X-ray images are then taken.  Endoscopy. In this test, a flexible telescope is inserted down  your throat to look at your esophagus and your stomach.  CT scans and MRI. How is this treated? Treatment for dysphagia depends on the cause of the condition:  If the dysphagia is caused by acid reflux or infection, medicines may be used. They may include antibiotics and heartburn medicines.  If the dysphagia is caused by problems with your muscles, swallowing therapy may be used to help you strengthen your swallowing muscles. You may have to do specific exercises to strengthen the muscles or stretch them.  If the dysphagia is caused by a blockage or mass, procedures to remove the blockage may be done. You may need surgery and a feeding tube. You may need to make diet changes. Ask your health care provider for specific instructions. Follow these instructions at home: Eating and drinking  Try to eat soft food that is easier to swallow.  Follow any diet changes as told by your health care provider.  Cut your food into small pieces and eat slowly.  Eat and drink only when you are sitting upright.  Do not drink alcohol or caffeine. If you need help quitting, ask your health care provider. General instructions  Check your weight every day to make sure you are not losing weight.  Take over-the-counter and prescription medicines only as told by your health care provider.  If you were prescribed an antibiotic medicine, take it as told by your health care provider. Do not stop taking the antibiotic even if you start to feel better.  Do not use any products that contain nicotine or tobacco, such as cigarettes and e-cigarettes. If you need help quitting, ask your health care provider.  Keep all follow-up visits as told by your health care provider. This is important. Contact a health care provider if:  You lose weight because you cannot swallow.  You cough when you drink liquids (aspiration).  You cough up partially digested food. Get help right away if:  You cannot swallow your  saliva.  You have shortness of breath or a fever, or both.  You have a hoarse voice and also have trouble swallowing. Summary  Dysphagia is trouble swallowing. This condition occurs when solids and liquids stick in a person's throat on the way down to the stomach, or when food takes longer to get to the stomach.  Dysphagia has many possible causes and symptoms.  Treatment for dysphagia depends on the cause of the condition. This information is not intended to replace advice given to you by your health care provider. Make sure you discuss any questions you have with your health care provider. Document Released: 06/24/2000 Document Revised: 06/16/2016 Document Reviewed: 06/16/2016 Elsevier Interactive Patient Education  2019 [employer].

## 2018-08-10 LAB — CBC WITH DIFFERENTIAL/PLATELET
Absolute Monocytes: 730 cells/uL (ref 200–950)
BASOS PCT: 0.7 %
Basophils Absolute: 58 cells/uL (ref 0–200)
EOS ABS: 208 {cells}/uL (ref 15–500)
Eosinophils Relative: 2.5 %
HCT: 47.1 % (ref 38.5–50.0)
Hemoglobin: 16.5 g/dL (ref 13.2–17.1)
Lymphs Abs: 1627 cells/uL (ref 850–3900)
MCH: 32.2 pg (ref 27.0–33.0)
MCHC: 35 g/dL (ref 32.0–36.0)
MCV: 92 fL (ref 80.0–100.0)
MPV: 10.4 fL (ref 7.5–12.5)
Monocytes Relative: 8.8 %
Neutro Abs: 5677 cells/uL (ref 1500–7800)
Neutrophils Relative %: 68.4 %
Platelets: 275 10*3/uL (ref 140–400)
RBC: 5.12 10*6/uL (ref 4.20–5.80)
RDW: 12.4 % (ref 11.0–15.0)
TOTAL LYMPHOCYTE: 19.6 %
WBC: 8.3 10*3/uL (ref 3.8–10.8)

## 2018-08-10 LAB — COMPLETE METABOLIC PANEL WITH GFR
AG Ratio: 2 (calc) (ref 1.0–2.5)
ALT: 26 U/L (ref 9–46)
AST: 20 U/L (ref 10–35)
Albumin: 4.4 g/dL (ref 3.6–5.1)
Alkaline phosphatase (APISO): 81 U/L (ref 40–115)
BUN: 15 mg/dL (ref 7–25)
CALCIUM: 10.4 mg/dL — AB (ref 8.6–10.3)
CO2: 25 mmol/L (ref 20–32)
Chloride: 105 mmol/L (ref 98–110)
Creat: 0.84 mg/dL (ref 0.70–1.18)
GFR, Est African American: 101 mL/min/{1.73_m2} (ref >=60)
GFR, Est Non African American: 87 mL/min/{1.73_m2} (ref >=60)
Globulin: 2.2 g/dL (calc) (ref 1.9–3.7)
Glucose, Bld: 94 mg/dL (ref 65–99)
Potassium: 4.3 mmol/L (ref 3.5–5.3)
Sodium: 141 mmol/L (ref 135–146)
Total Bilirubin: 0.4 mg/dL (ref 0.2–1.2)
Total Protein: 6.6 g/dL (ref 6.1–8.1)

## 2018-08-10 LAB — LIPID PANEL
Cholesterol: 139 mg/dL (ref <200)
HDL: 37 mg/dL — ABNORMAL LOW (ref >40)
LDL Cholesterol (Calc): 74 mg/dL (calc)
Non-HDL Cholesterol (Calc): 102 mg/dL (calc) (ref <130)
TRIGLYCERIDES: 189 mg/dL — AB (ref <150)
Total CHOL/HDL Ratio: 3.8 (calc) (ref <5.0)

## 2018-08-10 LAB — TSH: TSH: 0.78 mIU/L (ref 0.40–4.50)

## 2018-08-10 LAB — MAGNESIUM: Magnesium: 2 mg/dL (ref 1.5–2.5)

## 2018-08-14 ENCOUNTER — Ambulatory Visit (INDEPENDENT_AMBULATORY_CARE_PROVIDER_SITE_OTHER): Payer: Medicare Other

## 2018-08-14 DIAGNOSIS — R002 Palpitations: Secondary | ICD-10-CM

## 2018-08-14 DIAGNOSIS — I480 Paroxysmal atrial fibrillation: Secondary | ICD-10-CM

## 2018-08-17 LAB — CUP PACEART REMOTE DEVICE CHECK
Date Time Interrogation Session: 20200204110728
Implantable Pulse Generator Implant Date: 20180312

## 2018-08-20 ENCOUNTER — Other Ambulatory Visit: Payer: Self-pay | Admitting: Internal Medicine

## 2018-08-20 DIAGNOSIS — R1310 Dysphagia, unspecified: Secondary | ICD-10-CM

## 2018-08-23 NOTE — Progress Notes (Signed)
[facility] Summary Report / Loop Recorder 

## 2018-08-28 DIAGNOSIS — R1314 Dysphagia, pharyngoesophageal phase: Secondary | ICD-10-CM | POA: Diagnosis not present

## 2018-09-04 ENCOUNTER — Other Ambulatory Visit: Payer: Self-pay | Admitting: Internal Medicine

## 2018-09-17 ENCOUNTER — Ambulatory Visit (INDEPENDENT_AMBULATORY_CARE_PROVIDER_SITE_OTHER): Payer: Medicare Other | Admitting: *Deleted

## 2018-09-17 DIAGNOSIS — D225 Melanocytic nevi of trunk: Secondary | ICD-10-CM | POA: Diagnosis not present

## 2018-09-17 DIAGNOSIS — D1801 Hemangioma of skin and subcutaneous tissue: Secondary | ICD-10-CM | POA: Diagnosis not present

## 2018-09-17 DIAGNOSIS — R002 Palpitations: Secondary | ICD-10-CM

## 2018-09-17 DIAGNOSIS — L304 Erythema intertrigo: Secondary | ICD-10-CM | POA: Diagnosis not present

## 2018-09-17 DIAGNOSIS — L738 Other specified follicular disorders: Secondary | ICD-10-CM | POA: Diagnosis not present

## 2018-09-17 DIAGNOSIS — I480 Paroxysmal atrial fibrillation: Secondary | ICD-10-CM | POA: Diagnosis not present

## 2018-09-17 DIAGNOSIS — L570 Actinic keratosis: Secondary | ICD-10-CM | POA: Diagnosis not present

## 2018-09-17 DIAGNOSIS — L821 Other seborrheic keratosis: Secondary | ICD-10-CM | POA: Diagnosis not present

## 2018-09-17 DIAGNOSIS — L812 Freckles: Secondary | ICD-10-CM | POA: Diagnosis not present

## 2018-09-17 DIAGNOSIS — L820 Inflamed seborrheic keratosis: Secondary | ICD-10-CM | POA: Diagnosis not present

## 2018-09-18 DIAGNOSIS — R3912 Poor urinary stream: Secondary | ICD-10-CM | POA: Diagnosis not present

## 2018-09-18 DIAGNOSIS — N5201 Erectile dysfunction due to arterial insufficiency: Secondary | ICD-10-CM | POA: Diagnosis not present

## 2018-09-18 LAB — CUP PACEART REMOTE DEVICE CHECK
Date Time Interrogation Session: 20200308114049
Implantable Pulse Generator Implant Date: 20180312

## 2018-09-21 DIAGNOSIS — H43813 Vitreous degeneration, bilateral: Secondary | ICD-10-CM | POA: Diagnosis not present

## 2018-09-21 DIAGNOSIS — H5000 Unspecified esotropia: Secondary | ICD-10-CM | POA: Diagnosis not present

## 2018-09-21 DIAGNOSIS — H52203 Unspecified astigmatism, bilateral: Secondary | ICD-10-CM | POA: Diagnosis not present

## 2018-09-21 DIAGNOSIS — H2513 Age-related nuclear cataract, bilateral: Secondary | ICD-10-CM | POA: Diagnosis not present

## 2018-09-25 NOTE — Progress Notes (Signed)
[facility] Summary Report / Loop Recorder 

## 2018-10-05 DIAGNOSIS — Z981 Arthrodesis status: Secondary | ICD-10-CM | POA: Diagnosis not present

## 2018-10-05 DIAGNOSIS — M542 Cervicalgia: Secondary | ICD-10-CM | POA: Diagnosis not present

## 2018-10-05 DIAGNOSIS — R200 Anesthesia of skin: Secondary | ICD-10-CM | POA: Diagnosis not present

## 2018-10-19 ENCOUNTER — Ambulatory Visit (INDEPENDENT_AMBULATORY_CARE_PROVIDER_SITE_OTHER): Payer: Medicare Other | Admitting: *Deleted

## 2018-10-19 ENCOUNTER — Other Ambulatory Visit: Payer: Self-pay

## 2018-10-19 DIAGNOSIS — I480 Paroxysmal atrial fibrillation: Secondary | ICD-10-CM | POA: Diagnosis not present

## 2018-10-19 DIAGNOSIS — R002 Palpitations: Secondary | ICD-10-CM

## 2018-10-20 LAB — CUP PACEART REMOTE DEVICE CHECK
Date Time Interrogation Session: 20200410124221
Implantable Pulse Generator Implant Date: 20180312

## 2018-10-29 ENCOUNTER — Telehealth (INDEPENDENT_AMBULATORY_CARE_PROVIDER_SITE_OTHER): Payer: Medicare Other | Admitting: Internal Medicine

## 2018-10-29 ENCOUNTER — Telehealth: Payer: Self-pay

## 2018-10-29 DIAGNOSIS — I480 Paroxysmal atrial fibrillation: Secondary | ICD-10-CM

## 2018-10-29 NOTE — Progress Notes (Signed)
[facility] Summary Report / Loop Recorder 

## 2018-10-29 NOTE — Progress Notes (Signed)
Electrophysiology TeleHealth Note   Due to national recommendations of social distancing due to COVID 19, an audio/video telehealth visit is felt to be most appropriate for this patient at this time.  See MyChart message from today for the patient's consent to telehealth for [facility].   Date:  10/29/2018   ID:  [name], DOB [birth year], MRN [id number]  Location: patient's home  Provider location: [address], [place]  Evaluation Performed: Follow-up visit  PCP:  Unk Pinto, MD  Electrophysiologist:  Dr Rayann Heman  Chief Complaint:  AF follow up  History of Present Illness:    [name] is a 73 y.o. male who presents via audio/video conferencing for a telehealth visit today.  Since last being seen in our clinic, the patient reports doing very well.  Today, he denies symptoms of palpitations, chest pain, shortness of breath,  lower extremity edema, dizziness, presyncope, or syncope.  The patient is otherwise without complaint today.  The patient denies symptoms of fevers, chills, cough, or new SOB worrisome for COVID 19.  Past Medical History:  Diagnosis Date  . A-fib ([place])   . Arthritis   . Benign prostatic hyperplasia   . BPH (benign prostatic hypertrophy)   . Carpal tunnel syndrome    bilateral  . Carpal tunnel syndrome, bilateral   . Early cataract   . History of SCC (squamous cell carcinoma) of skin 10/04/2016  . HNP (herniated nucleus pulposus), cervical   . Hyperlipidemia   . Hypertension   . IBS (irritable bowel syndrome)   . Mild acid reflux occasional  . Obesity   . Other abnormal glucose   . Pelvic pain in male   . Pneumonia    " walking"  . PONV (postoperative nausea and vomiting)   . Pre-diabetes   . Vitamin D deficiency   . Wears glasses     Past Surgical History:  Procedure Laterality Date  . ANTERIOR CERVICAL DECOMP/DISCECTOMY FUSION  01-25-2002   C4 - C5  . ANTERIOR CERVICAL DECOMP/DISCECTOMY FUSION  Bilateral 07/16/2018   Procedure: REMOVAL OF ANTERIOR CERVICAL PLATE, ANTERIOR CERVICAL DECOMPRESSION/DISCECTOMY FUSION CERVICAL THREE- CERVICAL FOUR;  Surgeon: Jovita Gamma, MD;  Location: [facility];  Service: Neurosurgery;  Laterality: Bilateral;  REMOVAL OF ANTERIOR CERVICAL PLATE, ANTERIOR CERVICAL DECOMPRESSION/DISCECTOMY FUSION CERVICAL THREE- CERVICAL FOUR  . ANTERIOR CRUCIATE LIGAMENT REPAIR  1990   RIGHT  . APPENDECTOMY  02-07-2007  . CARPAL TUNNEL RELEASE Right 07/16/2018   Procedure: CARPAL TUNNEL RELEASE;  Surgeon: Jovita Gamma, MD;  Location: [facility];  Service: Neurosurgery;  Laterality: Right;  CARPAL TUNNEL RELEASE  . CERVICAL FUSION  2000   C5 - 6  . COLONOSCOPY  05-30-2011   HEMORRHOIDS  . HERNIA REPAIR    . HIP SURGERY  1999   LEFT HIP --  REMOVAL CALCIUM BUILD-UP  . LEFT ANKLE SURG.  1980  . LEFT INGUINAL HERNIA REPAIR  1970  . LEFT ROTATOR CUFF REPAIR  2002  . LOOP RECORDER INSERTION N/A 09/19/2016   Procedure: Loop Recorder Insertion;  Surgeon: Thompson Grayer, MD;  Location: [facility];  Service: Cardiovascular;  Laterality: N/A;  . TENDON RECONSTRUCTION Left 07/17/2013   Procedure: LEFT ELBOW LATERAL RECONSTRUCTION;  Surgeon: Schuyler Amor, MD;  Location: [facility];  Service: Orthopedics;  Laterality: Left;  . TONSILLECTOMY    . TRIGGER FINGER RELEASE Right 07/17/2013   Procedure: RELEASE A-1 PULLEY RIGHT RING FINGER;  Surgeon: Schuyler Amor, MD;  Location: [place];  Service: Orthopedics;  Laterality: Right;  . UMBILICAL HERNIA REPAIR  2003    Current Outpatient Medications  Medication Sig Dispense Refill  . ALPRAZolam (XANAX) 1 MG tablet Take 1/2 to 1 tablet at Bedtime ONLY if needed for Sleep & please try to limit to 5 days /week to avoid addiction (Must last 90 days) 90 tablet 0  . Carboxymethylcellulose Sodium (REFRESH TEARS OP) Place 1 drop into both eyes daily as needed (for dry eyes).     . Cholecalciferol (VITAMIN  D3) 5000 units CAPS Take 5,000 Units by mouth daily. Take 2 tablets daily    . cyclobenzaprine (FLEXERIL) 10 MG tablet Take 0.5 tablets by mouth daily.    . diclofenac sodium (VOLTAREN) 1 % GEL Apply 2 g topically daily as needed (pain).    Marland Kitchen diltiazem (CARDIZEM) 30 MG tablet Take 1 tablet (30 mg total) by mouth 2 (two) times daily as needed (q6h PRN for palpitations.). 30 tablet 3  . ELIQUIS 5 MG TABS tablet TAKE 1 TABLET BY MOUTH TWO  TIMES DAILY 180 tablet 1  . finasteride (PROSCAR) 5 MG tablet Take 5 mg by mouth daily.    . Flaxseed, Linseed, (FLAX SEED OIL PO) Take 1 capsule by mouth daily.    . flecainide (TAMBOCOR) 50 MG tablet Take 50 mg by mouth 2 (two) times daily.    Marland Kitchen losartan (COZAAR) 50 MG tablet Take 25 mg by mouth daily.    . Multiple Vitamin (MULTIVITAMIN) tablet Take 1 tablet by mouth daily.    . Omega-3 Fatty Acids (FISH OIL PO) Take 1 capsule by mouth 2 (two) times daily.    Marland Kitchen omeprazole (PRILOSEC) 40 MG capsule Take 1 capsule by mouth daily.    Marland Kitchen RAPAFLO 8 MG CAPS capsule Take 8 mg by mouth as needed (for urinary retention).     . rosuvastatin (CRESTOR) 20 MG tablet Take 20 mg by mouth daily.    . verapamil (CALAN-SR) 120 MG CR tablet TAKE 1 TABLET BY MOUTH AT  BEDTIME OR AS DIRECTED. 90 tablet 1  . vitamin C (ASCORBIC ACID) 500 MG tablet Take 500 mg by mouth daily.     No current facility-administered medications for this visit.     Allergies:   Xarelto [rivaroxaban]; Atorvastatin; Clonazepam; and Tamsulosin hcl   Social History:  The patient  reports that he has never smoked. He has never used smokeless tobacco. He reports current alcohol use of about 4.0 standard drinks of alcohol per week. He reports that he does not use drugs.   Family History:  The patient's family history includes Alcohol abuse in his father; Diabetes in his father; Heart attack (age of onset: 73) in his father; Heart disease in his father and mother; Stroke (age of onset: 45) in his mother.    ROS:  Please see the history of present illness.   All other systems are personally reviewed and negative.    Exam:     Well appearing, alert and conversant, regular work of breathing,  good skin color Eyes- anicteric, neuro- grossly intact, skin- no apparent rash or lesions or cyanosis, mouth- oral mucosa is pink   Labs/Other Tests and Data Reviewed:    Recent Labs: 08/09/2018: ALT 26; BUN 15; Creat 0.84; Hemoglobin 16.5; Magnesium 2.0; Platelets 275; Potassium 4.3; Sodium 141; TSH 0.78   Wt Readings from Last 3 Encounters:  08/09/18 236 lb 6.4 oz (107.2 kg)  07/06/18 245 lb 3 oz (111.2 kg)  05/03/18 248 lb 12.8 oz (112.9 kg)     Other studies personally reviewed: Additional studies/ records that were reviewed today include: prior notes   Review of the above records today demonstrates: as above  Last device remote is reviewed from [place] PDF no significant AF   ASSESSMENT & PLAN:    1.  Paroxysmal atrial fibrillation and typical atrial flutter Doing well post ablation Burden <0.1% by device interrogation Continue Flecainide Continue Eliquis for CHADS2VASC of 2 Plan to stop after COVID-19  2.  HTN Stable No change required today  3.  ?murmur Per PCP Will re-evaluate at follow up with physical exam     COVID 19 screen The patient denies symptoms of COVID 19 at this time.  The importance of social distancing was discussed today.  Follow-up:  9 months   Current medicines are reviewed at length with the patient today.   The patient does not have concerns regarding his medicines.  The following changes were made today:  none  Labs/ tests ordered today include: none No orders of the defined types were placed in this encounter.    Patient Risk:  after full review of this patients clinical status, I feel that they are at moderate risk at this time.  Today, I have spent 15 minutes with the patient with telehealth technology discussing AF .    Army Fossa, MD  10/29/2018 2:29 PM     [facility] [address] [place] [address] [phone] (office) [phone] (fax)

## 2018-10-29 NOTE — Telephone Encounter (Signed)
Spoke with pt regarding appt on 10/29/18. Pt stated he will check vitals prior to appt. Pt questions and concerns were address.

## 2018-11-13 ENCOUNTER — Encounter: Payer: Self-pay | Admitting: Internal Medicine

## 2018-11-13 ENCOUNTER — Ambulatory Visit (INDEPENDENT_AMBULATORY_CARE_PROVIDER_SITE_OTHER): Payer: Medicare Other | Admitting: Internal Medicine

## 2018-11-13 ENCOUNTER — Other Ambulatory Visit: Payer: Self-pay

## 2018-11-13 VITALS — BP 132/80 | HR 68 | Temp 97.0°F | Resp 16 | Ht 72.0 in | Wt 236.8 lb

## 2018-11-13 DIAGNOSIS — I10 Essential (primary) hypertension: Secondary | ICD-10-CM

## 2018-11-13 DIAGNOSIS — R7303 Prediabetes: Secondary | ICD-10-CM | POA: Insufficient documentation

## 2018-11-13 DIAGNOSIS — E559 Vitamin D deficiency, unspecified: Secondary | ICD-10-CM | POA: Diagnosis not present

## 2018-11-13 DIAGNOSIS — E782 Mixed hyperlipidemia: Secondary | ICD-10-CM

## 2018-11-13 DIAGNOSIS — R7309 Other abnormal glucose: Secondary | ICD-10-CM | POA: Diagnosis not present

## 2018-11-13 DIAGNOSIS — Z79899 Other long term (current) drug therapy: Secondary | ICD-10-CM

## 2018-11-13 DIAGNOSIS — I480 Paroxysmal atrial fibrillation: Secondary | ICD-10-CM | POA: Diagnosis not present

## 2018-11-13 MED ORDER — METRONIDAZOLE 500 MG PO TABS: ORAL_TABLET | ORAL | 0 refills | Status: DC

## 2018-11-13 MED ORDER — CIPROFLOXACIN HCL 500 MG PO TABS: ORAL_TABLET | ORAL | 0 refills | Status: DC

## 2018-11-13 NOTE — Patient Instructions (Signed)

## 2018-11-13 NOTE — Progress Notes (Signed)
History of Present Illness:      This very nice 73 y.o. MWM presents for 6 month follow up with HTN, pAfib, HLD, Pre-Diabetes and Vitamin D Deficiency.  Patient also has Obstructive BPH/LUTS improved on Rapaflo.      Patient is treated for HTN (2007)  & BP has been controlled at home. Today's BP is at goal - 132/80.  Patient was dx'd with pAfib in 2018 and is on Eliquis  followed by Dr Marlou Porch. Patient has had no complaints of any cardiac type chest pain, palpitations, dyspnea / orthopnea / PND, dizziness, claudication, or dependent edema.      Hyperlipidemia is controlled with diet & meds. Patient denies myalgias or other med SE's. Last Lipids were  Lab Results  Component Value Date   CHOL 149 11/13/2018   HDL 45 11/13/2018   LDLCALC 77 11/13/2018   TRIG 172 (H) 11/13/2018   CHOLHDL 3.3 11/13/2018       Also, the patient has Obesity (BMI 31+) and  history of PreDiabetes (A1c 5.7% / 2013) and has had no symptoms of reactive hypoglycemia, diabetic polys, paresthesias or visual blurring.  Last A1c was  Lab Results  Component Value Date   HGBA1C 5.3 11/13/2018       Further, the patient also has history of Vitamin D Deficiency ("33" / 2008)  and supplements vitamin D without any suspected side-effects. Last vitamin D was at goal: Lab Results  Component Value Date   VD25OH 18 11/13/2018    Current Outpatient Medications on File Prior to Visit  Medication Sig  . ALPRAZolam (XANAX) 1 MG tablet Take 1/2 to 1 tablet at Bedtime ONLY if needed for Sleep & please try to limit to 5 days /week to avoid addiction (Must last 90 days)  . Carboxymethylcellulose Sodium (REFRESH TEARS OP) Place 1 drop into both eyes daily as needed (for dry eyes).   . Cholecalciferol (VITAMIN D3) 5000 units CAPS Take 5,000 Units by mouth daily. Take 2 tablets daily  . cyclobenzaprine (FLEXERIL) 10 MG tablet Take 0.5 tablets by mouth daily.  . diclofenac sodium (VOLTAREN) 1 % GEL Apply 2 g topically daily as needed  (pain).  Marland Kitchen diltiazem (CARDIZEM) 30 MG tablet Take 1 tablet (30 mg total) by mouth 2 (two) times daily as needed (q6h PRN for palpitations.).  Marland Kitchen ELIQUIS 5 MG TABS tablet TAKE 1 TABLET BY MOUTH TWO  TIMES DAILY  . finasteride (PROSCAR) 5 MG tablet Take 5 mg by mouth daily.  . Flaxseed, Linseed, (FLAX SEED OIL PO) Take 1 capsule by mouth daily.  . flecainide (TAMBOCOR) 50 MG tablet Take 50 mg by mouth 2 (two) times daily.  Marland Kitchen losartan (COZAAR) 50 MG tablet Take 25 mg by mouth daily.  . Multiple Vitamin (MULTIVITAMIN) tablet Take 1 tablet by mouth daily.  . Omega-3 Fatty Acids (FISH OIL PO) Take 1 capsule by mouth 2 (two) times daily.  Marland Kitchen omeprazole (PRILOSEC) 40 MG capsule Take 1 capsule by mouth daily.  Marland Kitchen RAPAFLO 8 MG CAPS capsule Take 8 mg by mouth as needed (for urinary retention).   . rosuvastatin (CRESTOR) 20 MG tablet Take 20 mg by mouth daily.  . verapamil (CALAN-SR) 120 MG CR tablet TAKE 1 TABLET BY MOUTH AT  BEDTIME OR AS DIRECTED.  Marland Kitchen vitamin C (ASCORBIC ACID) 500 MG tablet Take 500 mg by mouth daily.   No current facility-administered medications on file prior to visit.    Allergies  Allergen Reactions  . Xarelto [Rivaroxaban]  Other (See Comments)    Severe body aches  . Atorvastatin Other (See Comments)    Myalgias  . Clonazepam Other (See Comments)    "Made me jittery"  . Tamsulosin Hcl Other (See Comments)    Hypotension   PMHx:   Past Medical History:  Diagnosis Date  . A-fib ([place])   . Arthritis   . Benign prostatic hyperplasia   . BPH (benign prostatic hypertrophy)   . Carpal tunnel syndrome    bilateral  . Carpal tunnel syndrome, bilateral   . Early cataract   . History of SCC (squamous cell carcinoma) of skin 10/04/2016  . HNP (herniated nucleus pulposus), cervical   . Hyperlipidemia   . Hypertension   . IBS (irritable bowel syndrome)   . Mild acid reflux occasional  . Obesity   . Other abnormal glucose   . Pelvic pain in male   . Pneumonia    " walking"   . PONV (postoperative nausea and vomiting)   . Pre-diabetes   . Vitamin D deficiency   . Wears glasses    Immunization History  Administered Date(s) Administered  . DTaP 07/11/2005  . Influenza Whole 04/02/2013  . Influenza, High Dose Seasonal PF 05/01/2014, 05/21/2015, 03/29/2016, 05/31/2017  . Pneumococcal Conjugate-13 05/01/2014  . Pneumococcal Polysaccharide-23 09/08/2008, 03/29/2016  . Td 12/16/2015  . Zoster 12/09/2008   Past Surgical History:  Procedure Laterality Date  . ANTERIOR CERVICAL DECOMP/DISCECTOMY FUSION  01-25-2002   C4 - C5  . ANTERIOR CERVICAL DECOMP/DISCECTOMY FUSION Bilateral 07/16/2018   Procedure: REMOVAL OF ANTERIOR CERVICAL PLATE, ANTERIOR CERVICAL DECOMPRESSION/DISCECTOMY FUSION CERVICAL THREE- CERVICAL FOUR;  Surgeon: Jovita Gamma, MD;  Location: [facility];  Service: Neurosurgery;  Laterality: Bilateral;  REMOVAL OF ANTERIOR CERVICAL PLATE, ANTERIOR CERVICAL DECOMPRESSION/DISCECTOMY FUSION CERVICAL THREE- CERVICAL FOUR  . ANTERIOR CRUCIATE LIGAMENT REPAIR  1990   RIGHT  . APPENDECTOMY  02-07-2007  . CARPAL TUNNEL RELEASE Right 07/16/2018   Procedure: CARPAL TUNNEL RELEASE;  Surgeon: Jovita Gamma, MD;  Location: [facility];  Service: Neurosurgery;  Laterality: Right;  CARPAL TUNNEL RELEASE  . CERVICAL FUSION  2000   C5 - 6  . COLONOSCOPY  05-30-2011   HEMORRHOIDS  . HERNIA REPAIR    . HIP SURGERY  1999   LEFT HIP --  REMOVAL CALCIUM BUILD-UP  . LEFT ANKLE SURG.  1980  . LEFT INGUINAL HERNIA REPAIR  1970  . LEFT ROTATOR CUFF REPAIR  2002  . LOOP RECORDER INSERTION N/A 09/19/2016   Procedure: Loop Recorder Insertion;  Surgeon: Thompson Grayer, MD;  Location: [facility];  Service: Cardiovascular;  Laterality: N/A;  . TENDON RECONSTRUCTION Left 07/17/2013   Procedure: LEFT ELBOW LATERAL RECONSTRUCTION;  Surgeon: Schuyler Amor, MD;  Location: [facility];  Service: Orthopedics;  Laterality: Left;  . TONSILLECTOMY    . TRIGGER FINGER  RELEASE Right 07/17/2013   Procedure: RELEASE A-1 PULLEY RIGHT RING FINGER;  Surgeon: Schuyler Amor, MD;  Location: [facility];  Service: Orthopedics;  Laterality: Right;  . UMBILICAL HERNIA REPAIR  2003   FHx:    Reviewed / unchanged SHx:    Reviewed / unchanged   Systems Review:  Constitutional: Denies fever, chills, wt changes, headaches, insomnia, fatigue, night sweats, change in appetite. Eyes: Denies redness, blurred vision, diplopia, discharge, itchy, watery eyes.  ENT: Denies discharge, congestion, post nasal drip, epistaxis, sore throat, earache, hearing loss, dental pain, tinnitus, vertigo, sinus pain, snoring.  CV: Denies chest pain, palpitations, irregular heartbeat,  syncope, dyspnea, diaphoresis, orthopnea, PND, claudication or edema. Respiratory: denies cough, dyspnea, DOE, pleurisy, hoarseness, laryngitis, wheezing.  Gastrointestinal: Denies dysphagia, odynophagia, heartburn, reflux, water brash, abdominal pain or cramps, nausea, vomiting, bloating, diarrhea, constipation, hematemesis, melena, hematochezia  or hemorrhoids. Genitourinary: Denies dysuria, frequency, urgency, nocturia, hesitancy, discharge, hematuria or flank pain. Musculoskeletal: Denies arthralgias, myalgias, stiffness, jt. swelling, pain, limping or strain/sprain.  Skin: Denies pruritus, rash, hives, warts, acne, eczema or change in skin lesion(s). Neuro: No weakness, tremor, incoordination, spasms, paresthesia or pain. Psychiatric: Denies confusion, memory loss or sensory loss. Endo: Denies change in weight, skin or hair change.  Heme/Lymph: No excessive bleeding, bruising or enlarged lymph nodes.  Physical Exam  BP 132/80   Pulse 68   Temp (!) 97 F (36.1 C)   Resp 16   Ht 6' (1.829 m)   Wt 236 lb 12.8 oz (107.4 kg)   BMI 32.12 kg/m   Appears  well nourished, well groomed  and in no distress.  Eyes: PERRLA, EOMs, conjunctiva no swelling or erythema. Sinuses: No  frontal/maxillary tenderness ENT/Mouth: EAC's clear, TM's nl w/o erythema, bulging. Nares clear w/o erythema, swelling, exudates. Oropharynx clear without erythema or exudates. Oral hygiene is good. Tongue normal, non obstructing. Hearing intact.  Neck: Supple. Thyroid not palpable. Car 2+/2+ without bruits, nodes or JVD. Chest: Respirations nl with BS clear & equal w/o rales, rhonchi, wheezing or stridor.  Cor: Heart sounds normal w/ regular rate and rhythm without sig. murmurs, gallops, clicks or rubs. Peripheral pulses normal and equal  without edema.  Abdomen: Soft & bowel sounds normal. Non-tender w/o guarding, rebound, hernias, masses or organomegaly.  Lymphatics: Unremarkable.  Musculoskeletal: Full ROM all peripheral extremities, joint stability, 5/5 strength and normal gait.  Skin: Warm, dry without exposed rashes, lesions or ecchymosis apparent.  Neuro: Cranial nerves intact, reflexes equal bilaterally. Sensory-motor testing grossly intact. Tendon reflexes grossly intact.  Pysch: Alert & oriented x 3.  Insight and judgement nl & appropriate. No ideations.  Assessment and Plan:  1. Essential hypertension  - Continue medication, monitor blood pressure at home.  - Continue DASH diet.  Reminder to go to the ER if any CP,  SOB, nausea, dizziness, severe HA, changes vision/speech.  - CBC with Differential/Platelet - COMPLETE METABOLIC PANEL WITH GFR - Magnesium - TSH  - Continue diet/meds, exercise,& lifestyle modifications.  - Continue monitor periodic cholesterol/liver & renal functions   2. Hyperlipidemia, mixed  - Lipid panel - TSH  3. Abnormal glucose  - Continue diet, exercise  - Lifestyle modifications.  - Monitor appropriate labs.  - Hemoglobin A1c - Insulin, random  4. Vitamin D deficiency  - Continue supplementation.   - VITAMIN D 25 Hydroxyl  5. Paroxysmal atrial fibrillation (HCC)  - TSH  6. Prediabetes  - Hemoglobin A1c - Insulin, random  7.  Medication management  - CBC with Differential/Platelet - COMPLETE METABOLIC PANEL WITH GFR - Magnesium - Lipid panel - TSH - Hemoglobin A1c - Insulin, random - VITAMIN D 25 Hydroxyl     Discussed  regular exercise, BP monitoring, weight control to achieve/maintain BMI less than 25 and discussed med and SE's. Recommended labs to assess and monitor clinical status with further disposition pending results of labs. I discussed the assessment and treatment plan with the patient. The patient was provided an opportunity to ask questions and all were answered. The patient agreed with the plan and demonstrated an understanding of the instructions.  I provided  Over 29 minutes of exam, counseling,  chart review and  complex critical decision making was performed   Kirtland Bouchard, MD

## 2018-11-15 LAB — CBC WITH DIFFERENTIAL/PLATELET
Absolute Monocytes: 576 cells/uL (ref 200–950)
Basophils Absolute: 72 cells/uL (ref 0–200)
Basophils Relative: 0.8 %
Eosinophils Absolute: 189 cells/uL (ref 15–500)
Eosinophils Relative: 2.1 %
HCT: 49.7 % (ref 38.5–50.0)
Hemoglobin: 17 g/dL (ref 13.2–17.1)
Lymphs Abs: 1548 cells/uL (ref 850–3900)
MCH: 31.6 pg (ref 27.0–33.0)
MCHC: 34.2 g/dL (ref 32.0–36.0)
MCV: 92.4 fL (ref 80.0–100.0)
MPV: 10.5 fL (ref 7.5–12.5)
Monocytes Relative: 6.4 %
Neutro Abs: 6615 cells/uL (ref 1500–7800)
Neutrophils Relative %: 73.5 %
Platelets: 230 10*3/uL (ref 140–400)
RBC: 5.38 10*6/uL (ref 4.20–5.80)
RDW: 12.9 % (ref 11.0–15.0)
Total Lymphocyte: 17.2 %
WBC: 9 10*3/uL (ref 3.8–10.8)

## 2018-11-15 LAB — VITAMIN D 25 HYDROXY (VIT D DEFICIENCY, FRACTURES): Vit D, 25-Hydroxy: 85 ng/mL (ref 30–100)

## 2018-11-15 LAB — COMPLETE METABOLIC PANEL WITH GFR
AG Ratio: 1.8 (calc) (ref 1.0–2.5)
ALT: 31 U/L (ref 9–46)
AST: 27 U/L (ref 10–35)
Albumin: 4.6 g/dL (ref 3.6–5.1)
Alkaline phosphatase (APISO): 82 U/L (ref 35–144)
BUN: 20 mg/dL (ref 7–25)
CO2: 26 mmol/L (ref 20–32)
Calcium: 9.6 mg/dL (ref 8.6–10.3)
Chloride: 103 mmol/L (ref 98–110)
Creat: 1.01 mg/dL (ref 0.70–1.18)
GFR, Est African American: 86 mL/min/{1.73_m2} (ref >=60)
GFR, Est Non African American: 74 mL/min/{1.73_m2} (ref >=60)
Globulin: 2.6 g/dL (calc) (ref 1.9–3.7)
Glucose, Bld: 74 mg/dL (ref 65–99)
Potassium: 4.2 mmol/L (ref 3.5–5.3)
Sodium: 139 mmol/L (ref 135–146)
Total Bilirubin: 0.4 mg/dL (ref 0.2–1.2)
Total Protein: 7.2 g/dL (ref 6.1–8.1)

## 2018-11-15 LAB — MAGNESIUM: Magnesium: 2 mg/dL (ref 1.5–2.5)

## 2018-11-15 LAB — TSH: TSH: 1.13 mIU/L (ref 0.40–4.50)

## 2018-11-15 LAB — LIPID PANEL
Cholesterol: 149 mg/dL (ref <200)
HDL: 45 mg/dL (ref >=40)
LDL Cholesterol (Calc): 77 mg/dL (calc)
Non-HDL Cholesterol (Calc): 104 mg/dL (calc) (ref <130)
Total CHOL/HDL Ratio: 3.3 (calc) (ref <5.0)
Triglycerides: 172 mg/dL — ABNORMAL HIGH (ref <150)

## 2018-11-15 LAB — HEMOGLOBIN A1C
Hgb A1c MFr Bld: 5.3 % of total Hgb (ref <5.7)
Mean Plasma Glucose: 105 (calc)
eAG (mmol/L): 5.8 (calc)

## 2018-11-15 LAB — INSULIN, RANDOM: Insulin: 12.7 u[IU]/mL

## 2018-11-19 ENCOUNTER — Encounter: Payer: Medicare Other | Admitting: Internal Medicine

## 2018-11-21 ENCOUNTER — Ambulatory Visit (INDEPENDENT_AMBULATORY_CARE_PROVIDER_SITE_OTHER): Payer: Medicare Other | Admitting: *Deleted

## 2018-11-21 ENCOUNTER — Other Ambulatory Visit: Payer: Self-pay

## 2018-11-21 DIAGNOSIS — I480 Paroxysmal atrial fibrillation: Secondary | ICD-10-CM | POA: Diagnosis not present

## 2018-11-21 DIAGNOSIS — R002 Palpitations: Secondary | ICD-10-CM

## 2018-11-21 LAB — CUP PACEART REMOTE DEVICE CHECK
Date Time Interrogation Session: 20200513133833
Implantable Pulse Generator Implant Date: 20180312

## 2018-11-27 ENCOUNTER — Telehealth (HOSPITAL_COMMUNITY): Payer: Self-pay | Admitting: *Deleted

## 2018-11-27 NOTE — Telephone Encounter (Signed)
Received transmission - in afib with rvr - encouraged patient to go to ER since having some neck pain/chest pain, sweats but pt wanted to try taking a 30mg  of cardizem followed by an extra dose of flecainide 50mg  which Roderic Palau Np stated pt could try. Reiterated  with patient if his HRs continued to be elevated he should proceed to the nearest ER and be cardioverted (no missed doses of eliquis) if after a few hours the above meds do not convert him. Instructed pt to call me tomorrow with an update of how he is doing - pt verbalized understanding.

## 2018-11-27 NOTE — Telephone Encounter (Signed)
Patient called in stating he hasn't been feeling well today and wonders if he maybe in AF. [place] have a way to check his BP/HR but will try sending a transmission. Instructed pt I would be back in touch with him once transmission received and reviewed.

## 2018-11-28 DIAGNOSIS — R079 Chest pain, unspecified: Secondary | ICD-10-CM | POA: Diagnosis not present

## 2018-11-28 DIAGNOSIS — Z7901 Long term (current) use of anticoagulants: Secondary | ICD-10-CM | POA: Diagnosis not present

## 2018-11-28 DIAGNOSIS — I480 Paroxysmal atrial fibrillation: Secondary | ICD-10-CM | POA: Diagnosis not present

## 2018-11-28 NOTE — Telephone Encounter (Signed)
Pt decided to go buy a bp cuff and attempt to stay home with hopes of converting on his own. BP 130/70 HR 120-150 he is not having the neck pain like yesterday with faster rates. Pt still hesitant to go to ER - he will try cardizem 30mg  tab now and again at noon - if still out at that point he will be more willing to go to ER. Pt will call me back this afternoon.

## 2018-11-28 NOTE — Telephone Encounter (Signed)
Patient continues in rapid AF - recommend he proceed to the nearest ER for cardioversion. Pt verbalized understanding and will head to the local hospital.

## 2018-11-29 ENCOUNTER — Other Ambulatory Visit (HOSPITAL_COMMUNITY): Payer: Self-pay | Admitting: *Deleted

## 2018-11-29 ENCOUNTER — Telehealth (HOSPITAL_COMMUNITY): Payer: Self-pay | Admitting: *Deleted

## 2018-11-29 MED ORDER — FLECAINIDE ACETATE 100 MG PO TABS: 100.0000 mg | ORAL_TABLET | Freq: Two times a day (BID) | ORAL | 1 refills | Status: DC

## 2018-11-29 NOTE — Telephone Encounter (Signed)
Patient feeling better after dccv in ER last night - ER did increase his dose of flecainide - will bring him in for EKG once back in town from the beach 6/15 to check intervals. Pt very appreciative of efforts with pt being out of town.

## 2018-12-04 ENCOUNTER — Ambulatory Visit (HOSPITAL_COMMUNITY)
Admission: RE | Admit: 2018-12-04 | Discharge: 2018-12-04 | Disposition: A | Discharge status: Home / Self Care | Payer: Medicare Other | Source: Ambulatory Visit | Attending: Nurse Practitioner | Admitting: Nurse Practitioner

## 2018-12-04 ENCOUNTER — Other Ambulatory Visit: Payer: Self-pay

## 2018-12-04 DIAGNOSIS — I480 Paroxysmal atrial fibrillation: Secondary | ICD-10-CM

## 2018-12-04 NOTE — Progress Notes (Signed)
Electrophysiology TeleHealth Note   Due to national recommendations of social distancing due to [place] 19,  video telehealth visit is felt to be most appropriate for this patient at this time.  See MyChart message/consent below  from today for patient consent regarding telehealth for the [facility].    Date:  12/04/2018   ID:  [name], DOB [birth year], MRN [id number]  Location:  [facility], [place] Provider location: [address] [place], [place] [address]  PCP:  Unk Pinto, MD  Primary Cardiologist:   Dr. Rayann Heman Primary Electrophysiologist: Dr.Allred   CC:" I went back into afib this am"    History of Present Illness: [name] is a 73 y.o. male who presents via video conferencing for a telehealth visit today.  The pt has [facility] for several weeks and went into afib last week with RVR, HR's into the 170's. He went to a small hospital in [place], [place], where they do not normally cardiovert pt's.. With the guidance of Dr. Mellody Memos ER MD was able to successfully cardiovert the pt. The  pt did well for several days but drank 3 glasses of wine last night and woke up in afib with RVR again./ He is taking 30 mg Cardizem as needed to try to slow the HR's. Despite the elevation of the HR, he is minimally symptomatic.    Today, he denies symptoms of   chest pain, shortness of breath, orthopnea, PND, lower extremity edema, claudication, dizziness, presyncope, syncope, bleeding, or neurologic sequela. The patient is tolerating medications without difficulties and is otherwise without complaint today.   he denies symptoms of cough, fevers, chills, or new SOB worrisome for COVID 19.    he has a BMI of There is no height or weight on file to calculate BMI.. There were no vitals filed for this visit.  Past Medical History:  Diagnosis Date  . A-fib ([place])   . Arthritis   . Benign prostatic hyperplasia   . BPH (benign prostatic hypertrophy)   .  Carpal tunnel syndrome    bilateral  . Carpal tunnel syndrome, bilateral   . Early cataract   . History of SCC (squamous cell carcinoma) of skin 10/04/2016  . HNP (herniated nucleus pulposus), cervical   . Hyperlipidemia   . Hypertension   . IBS (irritable bowel syndrome)   . Mild acid reflux occasional  . Obesity   . Other abnormal glucose   . Pelvic pain in male   . Pneumonia    " walking"  . PONV (postoperative nausea and vomiting)   . Pre-diabetes   . Vitamin D deficiency   . Wears glasses    Past Surgical History:  Procedure Laterality Date  . ANTERIOR CERVICAL DECOMP/DISCECTOMY FUSION  01-25-2002   C4 - C5  . ANTERIOR CERVICAL DECOMP/DISCECTOMY FUSION Bilateral 07/16/2018   Procedure: REMOVAL OF ANTERIOR CERVICAL PLATE, ANTERIOR CERVICAL DECOMPRESSION/DISCECTOMY FUSION CERVICAL THREE- CERVICAL FOUR;  Surgeon: Jovita Gamma, MD;  Location: [place];  Service: Neurosurgery;  Laterality: Bilateral;  REMOVAL OF ANTERIOR CERVICAL PLATE, ANTERIOR CERVICAL DECOMPRESSION/DISCECTOMY FUSION CERVICAL THREE- CERVICAL FOUR  . ANTERIOR CRUCIATE LIGAMENT REPAIR  1990   RIGHT  . APPENDECTOMY  02-07-2007  . CARPAL TUNNEL RELEASE Right 07/16/2018   Procedure: CARPAL TUNNEL RELEASE;  Surgeon: Jovita Gamma, MD;  Location: [place];  Service: Neurosurgery;  Laterality: Right;  CARPAL TUNNEL RELEASE  . CERVICAL FUSION  2000   C5 - 6  . COLONOSCOPY  05-30-2011  HEMORRHOIDS  . HERNIA REPAIR    . HIP SURGERY  1999   LEFT HIP --  REMOVAL CALCIUM BUILD-UP  . LEFT ANKLE SURG.  1980  . LEFT INGUINAL HERNIA REPAIR  1970  . LEFT ROTATOR CUFF REPAIR  2002  . LOOP RECORDER INSERTION N/A 09/19/2016   Procedure: Loop Recorder Insertion;  Surgeon: Thompson Grayer, MD;  Location: [place] [facility];  Service: Cardiovascular;  Laterality: N/A;  . TENDON RECONSTRUCTION Left 07/17/2013   Procedure: LEFT ELBOW LATERAL RECONSTRUCTION;  Surgeon: Schuyler Amor, MD;  Location: [place];  Service:  Orthopedics;  Laterality: Left;  . TONSILLECTOMY    . TRIGGER FINGER RELEASE Right 07/17/2013   Procedure: RELEASE A-1 PULLEY RIGHT RING FINGER;  Surgeon: Schuyler Amor, MD;  Location: [place];  Service: Orthopedics;  Laterality: Right;  . UMBILICAL HERNIA REPAIR  2003     Current Outpatient Medications  Medication Sig Dispense Refill  . ALPRAZolam (XANAX) 1 MG tablet Take 1/2 to 1 tablet at Bedtime ONLY if needed for Sleep & please try to limit to 5 days /week to avoid addiction (Must last 90 days) 90 tablet 0  . Carboxymethylcellulose Sodium (REFRESH TEARS OP) Place 1 drop into both eyes daily as needed (for dry eyes).     . Cholecalciferol (VITAMIN D3) 5000 units CAPS Take 5,000 Units by mouth daily. Take 2 tablets daily    . ciprofloxacin (CIPRO) 500 MG tablet Take 1 tablet 2 x /day with a meal for Infection 28 tablet 0  . cyclobenzaprine (FLEXERIL) 10 MG tablet Take 0.5 tablets by mouth daily.    . diclofenac sodium (VOLTAREN) 1 % GEL Apply 2 g topically daily as needed (pain).    Marland Kitchen diltiazem (CARDIZEM) 30 MG tablet Take 1 tablet (30 mg total) by mouth 2 (two) times daily as needed (q6h PRN for palpitations.). 30 tablet 3  . ELIQUIS 5 MG TABS tablet TAKE 1 TABLET BY MOUTH TWO  TIMES DAILY 180 tablet 1  . finasteride (PROSCAR) 5 MG tablet Take 5 mg by mouth daily.    . Flaxseed, Linseed, (FLAX SEED OIL PO) Take 1 capsule by mouth daily.    . flecainide (TAMBOCOR) 100 MG tablet Take 1 tablet (100 mg total) by mouth 2 (two) times daily. 180 tablet 1  . losartan (COZAAR) 50 MG tablet Take 25 mg by mouth daily.    . metroNIDAZOLE (FLAGYL) 500 MG tablet Take 1 tablet 3 x /day with a meal for infection 42 tablet 0  . Multiple Vitamin (MULTIVITAMIN) tablet Take 1 tablet by mouth daily.    . Omega-3 Fatty Acids (FISH OIL PO) Take 1 capsule by mouth 2 (two) times daily.    Marland Kitchen omeprazole (PRILOSEC) 40 MG capsule Take 1 capsule by mouth daily.    Marland Kitchen RAPAFLO 8 MG CAPS capsule  Take 8 mg by mouth as needed (for urinary retention).     . rosuvastatin (CRESTOR) 20 MG tablet Take 20 mg by mouth daily.    . verapamil (CALAN-SR) 120 MG CR tablet TAKE 1 TABLET BY MOUTH AT  BEDTIME OR AS DIRECTED. 90 tablet 1  . vitamin C (ASCORBIC ACID) 500 MG tablet Take 500 mg by mouth daily.     No current facility-administered medications for this encounter.     Allergies:   Xarelto [rivaroxaban]; Atorvastatin; Clonazepam; and Tamsulosin hcl   Social History:  The patient  reports that he has never smoked. He has never used smokeless  tobacco. He reports current alcohol use of about 4.0 standard drinks of alcohol per week. He reports that he does not use drugs.   Family History:  The patient's  family history includes Alcohol abuse in his father; Diabetes in his father; Heart attack (age of onset: 72) in his father; Heart disease in his father and mother; Stroke (age of onset: 54) in his mother.    ROS:  Please see the history of present illness.   All other systems are personally reviewed and negative.   Exam: Well appearing, alert and conversant, regular work of breathing,  good skin color  Recent Labs: 11/13/2018: ALT 31; BUN 20; Creat 1.01; Hemoglobin 17.0; Magnesium 2.0; Platelets 230; Potassium 4.2; Sodium 139; TSH 1.13  personally reviewed    Other studies personally reviewed: Epic records reviewed linq strips reviewed show possible afib/flutter with rvr    ASSESSMENT AND PLAN:  1.   Persistent atrial fibrillation and typical atrial flutter Pt had a successful cardioversion last week but now is back out of rhythm with RVR He is minimally symptomatic He did drink 3 glasses of wine last night that may have been the trigger Discussed with Dr. Rayann Heman earlier and he gave his options to have another cardioversion but [facility] [place], if he wants to stay at the [facility] as planned until 6/15 or come to [facility] for  CV His  wife is there with him and can drive him home Also discussed stopping flecainide and starting amiodarone as a possible bridge to ablation For now, pt  will avoid alcohol and wants to try the prn cardizem for another day, if  he does not convert, he thinks at this point he may go to [facility] for [facility] Continue eliquis 5 mg bid, states no missed doses   This patients CHA2DS2-VASc Score and unadjusted Ischemic Stroke Rate (% per year) is equal to 2.2 % stroke rate/year from a score of 2  Above score calculated as 1 point each if present [CHF, HTN, DM, Vascular=MI/PAD/Aortic Plaque, Age if 65-74, or Male] Above score calculated as 2 points each if present [Age > 75, or Stroke/TIA/TE]    COVID screen The patient does not have any symptoms that suggest any further testing/ screening at this time.  Social distancing reinforced today.    Follow-up:   As scheduled 6/15 in the afib clinic  Current medicines are reviewed at length with the patient today.   The patient does not have concerns regarding his medicines.  The following changes were made today:  none  Labs/ tests ordered today include: none No orders of the defined types were placed in this encounter.   Patient Risk:  after full review of this patients clinical status, I feel that they are at moderate  risk at this time.   Today, I have spent 15 minutes with the patient with telehealth technology discussing above  .    Eduard Roux NP  12/04/2018 3:40 PM  Afib [facility] [address] [place], [place] [address] [phone]   I hereby voluntarily request, consent and authorize the [facility] and its employed or contracted physicians, physician assistants, nurse practitioners or other licensed health care professionals (the Practitioner), to provide me with telemedicine health care services (the "Services") as deemed necessary by the treating Practitioner. I acknowledge and consent to  receive the Services by the Practitioner via telemedicine. I understand that the telemedicine visit  will involve communicating with the Practitioner through live audiovisual communication technology and the disclosure of certain medical information by electronic transmission. I acknowledge that I have been given the opportunity to request an in-person assessment or other available alternative prior to the telemedicine visit and am voluntarily participating in the telemedicine visit.   I understand that I have the right to withhold or withdraw my consent to the use of telemedicine in the course of my care at any time, without affecting my right to future care or treatment, and that the Practitioner or I may terminate the telemedicine visit at any time. I understand that I have the right to inspect all information obtained and/or recorded in the course of the telemedicine visit and may receive copies of available information for a reasonable fee.  I understand that some of the potential risks of receiving the Services via telemedicine include:   Delay or interruption in medical evaluation due to technological equipment failure or disruption;  Information transmitted may not be sufficient (e.g. poor resolution of images) to allow for appropriate medical decision making by the Practitioner; and/or  In rare instances, security protocols could fail, causing a breach of personal health information.   Furthermore, I acknowledge that it is my responsibility to provide information about my medical history, conditions and care that is complete and accurate to the best of my ability. I acknowledge that Practitioner's advice, recommendations, and/or decision may be based on factors not within their control, such as incomplete or inaccurate data provided by me or distortions of diagnostic images or specimens that may result from electronic transmissions. I understand that the practice of medicine is not an exact science  and that Practitioner makes no warranties or guarantees regarding treatment outcomes. I acknowledge that I will receive a copy of this consent concurrently upon execution via email to the email address I last provided but may also request a printed copy by calling the office of the [facility].  I understand that my insurance will be billed for this visit.   I have read or had this consent read to me.  I understand the contents of this consent, which adequately explains the benefits and risks of the Services being provided via telemedicine.  I have been provided ample opportunity to ask questions regarding this consent and the Services and have had my questions answered to my satisfaction.  I give my informed consent for the services to be provided through the use of telemedicine in my medical care  By participating in this telemedicine visit I agree to the above.

## 2018-12-06 ENCOUNTER — Telehealth (HOSPITAL_COMMUNITY): Payer: Self-pay | Admitting: *Deleted

## 2018-12-06 NOTE — Telephone Encounter (Signed)
Pt called in stating he performed the valsalva maneuver yesterday afternoon and this converted him back in normal rhythm. He has been in the 60-70s ever since.

## 2018-12-11 NOTE — Progress Notes (Signed)
[facility] Summary Report / Loop Recorder 

## 2018-12-24 ENCOUNTER — Ambulatory Visit (HOSPITAL_COMMUNITY)
Admission: RE | Admit: 2018-12-24 | Discharge: 2018-12-24 | Disposition: A | Discharge status: Home / Self Care | Payer: Medicare Other | Source: Ambulatory Visit | Attending: Nurse Practitioner | Admitting: Nurse Practitioner

## 2018-12-24 ENCOUNTER — Ambulatory Visit (INDEPENDENT_AMBULATORY_CARE_PROVIDER_SITE_OTHER): Payer: Medicare Other | Admitting: *Deleted

## 2018-12-24 ENCOUNTER — Other Ambulatory Visit: Payer: Self-pay

## 2018-12-24 VITALS — BP 138/74 | HR 69 | Ht 72.0 in | Wt 257.4 lb

## 2018-12-24 DIAGNOSIS — E559 Vitamin D deficiency, unspecified: Secondary | ICD-10-CM | POA: Insufficient documentation

## 2018-12-24 DIAGNOSIS — R7303 Prediabetes: Secondary | ICD-10-CM | POA: Diagnosis not present

## 2018-12-24 DIAGNOSIS — I4892 Unspecified atrial flutter: Secondary | ICD-10-CM | POA: Insufficient documentation

## 2018-12-24 DIAGNOSIS — I480 Paroxysmal atrial fibrillation: Secondary | ICD-10-CM

## 2018-12-24 DIAGNOSIS — I10 Essential (primary) hypertension: Secondary | ICD-10-CM | POA: Diagnosis not present

## 2018-12-24 DIAGNOSIS — E785 Hyperlipidemia, unspecified: Secondary | ICD-10-CM | POA: Insufficient documentation

## 2018-12-24 DIAGNOSIS — E669 Obesity, unspecified: Secondary | ICD-10-CM | POA: Diagnosis not present

## 2018-12-24 DIAGNOSIS — Z6834 Body mass index (BMI) 34.0-34.9, adult: Secondary | ICD-10-CM | POA: Insufficient documentation

## 2018-12-24 DIAGNOSIS — Z79899 Other long term (current) drug therapy: Secondary | ICD-10-CM | POA: Diagnosis not present

## 2018-12-24 DIAGNOSIS — K219 Gastro-esophageal reflux disease without esophagitis: Secondary | ICD-10-CM | POA: Insufficient documentation

## 2018-12-24 DIAGNOSIS — Z833 Family history of diabetes mellitus: Secondary | ICD-10-CM | POA: Diagnosis not present

## 2018-12-24 DIAGNOSIS — Z888 Allergy status to other drugs, medicaments and biological substances status: Secondary | ICD-10-CM | POA: Insufficient documentation

## 2018-12-24 DIAGNOSIS — Z8249 Family history of ischemic heart disease and other diseases of the circulatory system: Secondary | ICD-10-CM | POA: Insufficient documentation

## 2018-12-24 LAB — CUP PACEART REMOTE DEVICE CHECK
Date Time Interrogation Session: 20200615133834
Implantable Pulse Generator Implant Date: 20180312

## 2018-12-24 NOTE — Progress Notes (Signed)
Primary Care Physician: Unk Pinto, MD Referring Physician: Dr. Tilford Pillar ABDALLA NARAMORE is a 74 y.o. male with a h/o afib/flutter that was recently seen by Dr. Rayann Heman f/u to an ER visit for RVR. He did convert with IV BB. He was started on flecainide 50 mg bid. The pt did well for several days but drank 3 glasses of wine and woke up in afib with RVR again.  On follow up today, patient reports that he converted back to SR with valsalva maneuver not long after his last appointment. He reports that he has been in SR since with no heart racing or palpitations.   Today, he denies symptoms of palpitations, chest pain, shortness of breath, orthopnea, PND, lower extremity edema, dizziness, presyncope, syncope, or neurologic sequela. The patient is tolerating medications without difficulties and is otherwise without complaint today.   Past Medical History:  Diagnosis Date  . A-fib ([place])   . Arthritis   . Benign prostatic hyperplasia   . BPH (benign prostatic hypertrophy)   . Carpal tunnel syndrome    bilateral  . Carpal tunnel syndrome, bilateral   . Early cataract   . History of SCC (squamous cell carcinoma) of skin 10/04/2016  . HNP (herniated nucleus pulposus), cervical   . Hyperlipidemia   . Hypertension   . IBS (irritable bowel syndrome)   . Mild acid reflux occasional  . Obesity   . Other abnormal glucose   . Pelvic pain in male   . Pneumonia    " walking"  . PONV (postoperative nausea and vomiting)   . Pre-diabetes   . Vitamin D deficiency   . Wears glasses    Past Surgical History:  Procedure Laterality Date  . ANTERIOR CERVICAL DECOMP/DISCECTOMY FUSION  01-25-2002   C4 - C5  . ANTERIOR CERVICAL DECOMP/DISCECTOMY FUSION Bilateral 07/16/2018   Procedure: REMOVAL OF ANTERIOR CERVICAL PLATE, ANTERIOR CERVICAL DECOMPRESSION/DISCECTOMY FUSION CERVICAL THREE- CERVICAL FOUR;  Surgeon: Jovita Gamma, MD;  Location: [place];  Service: Neurosurgery;  Laterality: Bilateral;   REMOVAL OF ANTERIOR CERVICAL PLATE, ANTERIOR CERVICAL DECOMPRESSION/DISCECTOMY FUSION CERVICAL THREE- CERVICAL FOUR  . ANTERIOR CRUCIATE LIGAMENT REPAIR  1990   RIGHT  . APPENDECTOMY  02-07-2007  . CARPAL TUNNEL RELEASE Right 07/16/2018   Procedure: CARPAL TUNNEL RELEASE;  Surgeon: Jovita Gamma, MD;  Location: [place];  Service: Neurosurgery;  Laterality: Right;  CARPAL TUNNEL RELEASE  . CERVICAL FUSION  2000   C5 - 6  . COLONOSCOPY  05-30-2011   HEMORRHOIDS  . HERNIA REPAIR    . HIP SURGERY  1999   LEFT HIP --  REMOVAL CALCIUM BUILD-UP  . LEFT ANKLE SURG.  1980  . LEFT INGUINAL HERNIA REPAIR  1970  . LEFT ROTATOR CUFF REPAIR  2002  . LOOP RECORDER INSERTION N/A 09/19/2016   Procedure: Loop Recorder Insertion;  Surgeon: Thompson Grayer, MD;  Location: [place] [facility];  Service: Cardiovascular;  Laterality: N/A;  . TENDON RECONSTRUCTION Left 07/17/2013   Procedure: LEFT ELBOW LATERAL RECONSTRUCTION;  Surgeon: Schuyler Amor, MD;  Location: [place];  Service: Orthopedics;  Laterality: Left;  . TONSILLECTOMY    . TRIGGER FINGER RELEASE Right 07/17/2013   Procedure: RELEASE A-1 PULLEY RIGHT RING FINGER;  Surgeon: Schuyler Amor, MD;  Location: [place];  Service: Orthopedics;  Laterality: Right;  . UMBILICAL HERNIA REPAIR  2003    Current Outpatient Medications  Medication Sig Dispense Refill  . ALPRAZolam (XANAX) 1 MG tablet Take  1/2 to 1 tablet at Bedtime ONLY if needed for Sleep & please try to limit to 5 days /week to avoid addiction (Must last 90 days) 90 tablet 0  . Carboxymethylcellulose Sodium (REFRESH TEARS OP) Place 1 drop into both eyes daily as needed (for dry eyes).     . Cholecalciferol (VITAMIN D3) 5000 units CAPS Take 5,000 Units by mouth daily. Take 2 tablets daily    . cyclobenzaprine (FLEXERIL) 10 MG tablet Take 0.5 tablets by mouth daily.    . diclofenac sodium (VOLTAREN) 1 % GEL Apply 2 g topically daily as needed (pain).    Marland Kitchen  diltiazem (CARDIZEM) 30 MG tablet Take 1 tablet (30 mg total) by mouth 2 (two) times daily as needed (q6h PRN for palpitations.). 30 tablet 3  . ELIQUIS 5 MG TABS tablet TAKE 1 TABLET BY MOUTH TWO  TIMES DAILY 180 tablet 1  . finasteride (PROSCAR) 5 MG tablet Take 5 mg by mouth daily.    . Flaxseed, Linseed, (FLAX SEED OIL PO) Take 1 capsule by mouth daily.    . flecainide (TAMBOCOR) 100 MG tablet Take 1 tablet (100 mg total) by mouth 2 (two) times daily. 180 tablet 1  . losartan (COZAAR) 50 MG tablet Take 25 mg by mouth daily.    . Multiple Vitamin (MULTIVITAMIN) tablet Take 1 tablet by mouth daily.    . Omega-3 Fatty Acids (FISH OIL PO) Take 1 capsule by mouth 2 (two) times daily.    Marland Kitchen omeprazole (PRILOSEC) 40 MG capsule Take 1 capsule by mouth daily.    Marland Kitchen RAPAFLO 8 MG CAPS capsule Take 8 mg by mouth as needed (for urinary retention).     . rosuvastatin (CRESTOR) 20 MG tablet Take 20 mg by mouth daily.    . verapamil (CALAN-SR) 120 MG CR tablet TAKE 1 TABLET BY MOUTH AT  BEDTIME OR AS DIRECTED. 90 tablet 1  . vitamin C (ASCORBIC ACID) 500 MG tablet Take 500 mg by mouth daily.     No current facility-administered medications for this encounter.     Allergies  Allergen Reactions  . Xarelto [Rivaroxaban] Other (See Comments)    Severe body aches  . Atorvastatin Other (See Comments)    Myalgias  . Clonazepam Other (See Comments)    "Made me jittery"  . Tamsulosin Hcl Other (See Comments)    Hypotension    Social History   Socioeconomic History  . Marital status: Married    Spouse name: Not on file  . Number of children: 1  . Years of education: Not on file  . Highest education level: Not on file  Occupational History  . Occupation: Personnel officer  Social Needs  . Financial resource strain: Not on file  . Food insecurity    Worry: Not on file    Inability: Not on file  . Transportation needs    Medical: Not on file    Non-medical: Not on file  Tobacco Use  . Smoking  status: Never Smoker  . Smokeless tobacco: Never Used  Substance and Sexual Activity  . Alcohol use: Yes    Alcohol/week: 4.0 standard drinks    Types: 4 Standard drinks or equivalent per week    Comment: OCCASIONAL  . Drug use: No  . Sexual activity: Not on file  Lifestyle  . Physical activity    Days per week: Not on file    Minutes per session: Not on file  . Stress: Not on file  Relationships  .  Social Herbalist on phone: Not on file    Gets together: Not on file    Attends religious service: Not on file    Active member of club or organization: Not on file    Attends meetings of clubs or organizations: Not on file    Relationship status: Not on file  . Intimate partner violence    Fear of current or ex partner: Not on file    Emotionally abused: Not on file    Physically abused: Not on file    Forced sexual activity: Not on file  Other Topics Concern  . Not on file  Social History Narrative  . Not on file    Family History  Problem Relation Age of Onset  . Heart disease Mother   . Stroke Mother 58  . Heart disease Father   . Diabetes Father   . Heart attack Father 53  . Alcohol abuse Father     ROS- All systems are reviewed and negative except as per the HPI above  Physical Exam: Vitals:   12/24/18 1435  BP: 138/74  Pulse: 69  Weight: 116.8 kg  Height: 6' (1.829 m)   Wt Readings from Last 3 Encounters:  12/24/18 116.8 kg  11/13/18 107.4 kg  08/09/18 107.2 kg    Labs: Lab Results  Component Value Date   NA 139 11/13/2018   K 4.2 11/13/2018   CL 103 11/13/2018   CO2 26 11/13/2018   GLUCOSE 74 11/13/2018   BUN 20 11/13/2018   CREATININE 1.01 11/13/2018   CALCIUM 9.6 11/13/2018   MG 2.0 11/13/2018   Lab Results  Component Value Date   INR 1.00 07/16/2018   Lab Results  Component Value Date   CHOL 149 11/13/2018   HDL 45 11/13/2018   LDLCALC 77 11/13/2018   TRIG 172 (H) 11/13/2018    GEN- The patient is well appearing  obese male, alert and oriented x 3 today.   HEENT-head normocephalic, atraumatic, sclera clear, conjunctiva pink, hearing intact, trachea midline. Lungs- Clear to ausculation bilaterally, normal work of breathing Heart- Regular rate and rhythm, no murmurs, rubs or gallops  GI- soft, NT, ND, + BS Extremities- no clubbing, cyanosis, or edema MS- no significant deformity or atrophy Skin- no rash or lesion Psych- euthymic mood, full affect Neuro- strength and sensation are intact   EKG- SR HR 69, PR 160, QRS 94, QTc 424  Epic notes review  Echo 08/17/16 - LVEF 55-60%, normal wall thickness, normal wall motion, diastolic   dysfunction, normal LV filling pressure, aortic valve sclerosis,   trivial MR, normal LA size, normal IVC.  Assessment and Plan: 1. Paroxysmal atrial fibrillation Patient converted to SR with valsalva maneuver. Now on flecainide 100 mg bid. Encouraged pt to limit alcohol intake.  Continue on verapamil 120 mg daily Continue on Eliquis 5 mg bid  This patients CHA2DS2-VASc Score and unadjusted Ischemic Stroke Rate (% per year) is equal to 2.2 % stroke rate/year from a score of 2  Above score calculated as 1 point each if present [CHF, HTN, DM, Vascular=MI/PAD/Aortic Plaque, Age if 65-74, or Male] Above score calculated as 2 points each if present [Age > 75, or Stroke/TIA/TE]  2. HTN Stable, no changes today.   F/u in the AF clinic in 3 months.   [facility] [address] [place], [place] [address] [phone]

## 2018-12-25 DIAGNOSIS — M1812 Unilateral primary osteoarthritis of first carpometacarpal joint, left hand: Secondary | ICD-10-CM | POA: Diagnosis not present

## 2018-12-27 ENCOUNTER — Other Ambulatory Visit: Payer: Self-pay | Admitting: *Deleted

## 2018-12-27 MED ORDER — ROSUVASTATIN CALCIUM 20 MG PO TABS: 20.0000 mg | ORAL_TABLET | Freq: Every day | ORAL | 3 refills | Status: DC

## 2019-01-03 NOTE — Progress Notes (Signed)
[facility] Summary Report / Loop Recorder 

## 2019-01-27 LAB — CUP PACEART REMOTE DEVICE CHECK
Date Time Interrogation Session: 20200718141133
Implantable Pulse Generator Implant Date: 20180312

## 2019-01-28 ENCOUNTER — Ambulatory Visit (INDEPENDENT_AMBULATORY_CARE_PROVIDER_SITE_OTHER): Payer: Medicare Other | Admitting: *Deleted

## 2019-01-28 DIAGNOSIS — I480 Paroxysmal atrial fibrillation: Secondary | ICD-10-CM

## 2019-01-28 DIAGNOSIS — M7632 Iliotibial band syndrome, left leg: Secondary | ICD-10-CM | POA: Diagnosis not present

## 2019-02-01 DIAGNOSIS — M79652 Pain in left thigh: Secondary | ICD-10-CM | POA: Diagnosis not present

## 2019-02-01 DIAGNOSIS — M7632 Iliotibial band syndrome, left leg: Secondary | ICD-10-CM | POA: Diagnosis not present

## 2019-02-01 DIAGNOSIS — M7981 Nontraumatic hematoma of soft tissue: Secondary | ICD-10-CM | POA: Diagnosis not present

## 2019-02-04 DIAGNOSIS — M7632 Iliotibial band syndrome, left leg: Secondary | ICD-10-CM | POA: Diagnosis not present

## 2019-02-04 DIAGNOSIS — M7981 Nontraumatic hematoma of soft tissue: Secondary | ICD-10-CM | POA: Diagnosis not present

## 2019-02-04 DIAGNOSIS — M79652 Pain in left thigh: Secondary | ICD-10-CM | POA: Diagnosis not present

## 2019-02-05 DIAGNOSIS — M5030 Other cervical disc degeneration, unspecified cervical region: Secondary | ICD-10-CM | POA: Diagnosis not present

## 2019-02-05 DIAGNOSIS — M47812 Spondylosis without myelopathy or radiculopathy, cervical region: Secondary | ICD-10-CM | POA: Diagnosis not present

## 2019-02-05 DIAGNOSIS — G5603 Carpal tunnel syndrome, bilateral upper limbs: Secondary | ICD-10-CM | POA: Diagnosis not present

## 2019-02-05 DIAGNOSIS — Z981 Arthrodesis status: Secondary | ICD-10-CM | POA: Diagnosis not present

## 2019-02-08 DIAGNOSIS — M7981 Nontraumatic hematoma of soft tissue: Secondary | ICD-10-CM | POA: Diagnosis not present

## 2019-02-08 DIAGNOSIS — M7632 Iliotibial band syndrome, left leg: Secondary | ICD-10-CM | POA: Diagnosis not present

## 2019-02-08 DIAGNOSIS — M79652 Pain in left thigh: Secondary | ICD-10-CM | POA: Diagnosis not present

## 2019-02-11 DIAGNOSIS — M7632 Iliotibial band syndrome, left leg: Secondary | ICD-10-CM | POA: Diagnosis not present

## 2019-02-11 DIAGNOSIS — M7981 Nontraumatic hematoma of soft tissue: Secondary | ICD-10-CM | POA: Diagnosis not present

## 2019-02-11 DIAGNOSIS — M79652 Pain in left thigh: Secondary | ICD-10-CM | POA: Diagnosis not present

## 2019-02-11 NOTE — Progress Notes (Signed)
MEDICARE ANNUAL WELLNESS VISIT AND FOLLOW UP Assessment:   Encounter for annual wellness medicare visit  Essential hypertension - continue medications, DASH diet, exercise and monitor at home. Call if greater than 130/80.  -     CBC with Differential/Platelet -     CMP/GFR -     TSH  Paroxysmal atrial fibrillation (HCC) NSR at this time, following Dr. Rayann Heman, continue elequis  Mixed hyperlipidemia -continue medications, check lipids, decrease fatty foods, increase activity.  -     Lipid panel  Other abnormal glucose Discussed general issues about diabetes pathophysiology and management., Educational material distributed., Suggested low cholesterol diet., Encouraged aerobic exercise., Discussed foot care., Reminded to get yearly retinal exam.  Medication management -     Magnesium  Vitamin D deficiency Continue supplement  Benign prostatic hyperplasia with urinary frequency Continue follow up urology - Dr. Tresa Moore  Irritable bowel syndrome, unspecified type Diet discussed  Class 1 obesity due to excess calories with serious comorbidity and body mass index (BMI) of 32.0 to 32.9 in adult - long discussion about weight loss, diet, and exercise  Obesity - long discussion about weight loss, diet, and exercise  History of SCC (squamous cell carcinoma) of skin Follow up DERM  Aortic atherosclerosis (HCC) Control blood pressure, cholesterol, glucose, increase exercise.  -     metroNIDAZOLE (METROCREAM) 0.75 % cream; Apply topically 2 (two) times daily.  Need for hepatitis C screening test -     Hepatitis C antibody   Future Appointments  Date Time Provider [place]  02/28/2019  8:00 AM [facility] DEVICE REMOTES [facility]  03/20/2019  1:30 PM Fenton, Alt[place]ont R, PA [id number] None  06/10/2019  3:00 PM Unk Pinto, MD [id number] None  07/22/2019  1:45 PM Allred, Jeneen Rinks, MD [facility]     Plan:   During the course of the visit the  patient was educated and counseled about appropriate screening and preventive services including:    Pneumococcal vaccine   Influenza vaccine  Td vaccine  Screening electrocardiogram  Colorectal cancer screening  Diabetes screening  Glaucoma screening  Nutrition counseling    Subjective:  [name] is a 73 y.o. male who presents for Medicare Annual Wellness Visit and 3 month follow up for HTN, hyperlipidemia, and vitamin D Def.   States he has bilateral rough skin on temples, has had frozen by derm. Has similar on chest.   He also saw an ENT for "frog" in his throat but states there was nothing wrong. Has had difficulty with swallowing pills occ. Has GERD very rare.   Patient was diagnosed with pAfib attributed to Decongestants for an Upper/lower Respiratory infection, followed by Dr Rayann Heman, has loop recorder, on elequis for CHADSVASC 2. Patient had cardioversion at [facility] and it has been good since that time.   Follows with Dr. Tresa Moore for BPH.   He is in with Dr. Archie Endo office for left knee PT.   BMI is Body mass index is 31.55 kg/m., he has not been working on diet.  Wt Readings from Last 3 Encounters:  02/18/19 232 lb 9.6 oz (105.5 kg)  12/24/18 257 lb 6.4 oz (116.8 kg)  11/13/18 236 lb 12.8 oz (107.4 kg)   His blood pressure has been controlled at home, today their BP is BP: 120/80 He does workout. He denies chest pain, shortness of breath, dizziness.   He is on cholesterol medication, on crestor 20 mg daily, has increased this and denies myalgias. His cholesterol is at goal. The  cholesterol last visit was:   Lab Results  Component Value Date   CHOL 149 11/13/2018   HDL 45 11/13/2018   LDLCALC 77 11/13/2018   TRIG 172 (H) 11/13/2018   CHOLHDL 3.3 11/13/2018    Had elevated A1C in 2013, has done well with diet/exercise. Last A1C in the office was:  Lab Results  Component Value Date   HGBA1C 5.3 11/13/2018   Patient is on Vitamin D supplement.    Lab Results  Component Value Date   VD25OH 66 11/13/2018        Medication Review: Current Outpatient Medications on File Prior to Visit  Medication Sig Dispense Refill  . ALPRAZolam (XANAX) 1 MG tablet Take 1/2 to 1 tablet at Bedtime ONLY if needed for Sleep & please try to limit to 5 days /week to avoid addiction (Must last 90 days) 90 tablet 0  . Carboxymethylcellulose Sodium (REFRESH TEARS OP) Place 1 drop into both eyes daily as needed (for dry eyes).     . Cholecalciferol (VITAMIN D3) 5000 units CAPS Take 5,000 Units by mouth daily. Take 2 tablets daily    . cyclobenzaprine (FLEXERIL) 10 MG tablet Take 0.5 tablets by mouth daily.    Marland Kitchen diltiazem (CARDIZEM) 30 MG tablet Take 1 tablet (30 mg total) by mouth 2 (two) times daily as needed (q6h PRN for palpitations.). 30 tablet 3  . ELIQUIS 5 MG TABS tablet TAKE 1 TABLET BY MOUTH TWO  TIMES DAILY 180 tablet 1  . finasteride (PROSCAR) 5 MG tablet Take 5 mg by mouth daily.    . Flaxseed, Linseed, (FLAX SEED OIL PO) Take 1 capsule by mouth daily.    . flecainide (TAMBOCOR) 100 MG tablet Take 1 tablet (100 mg total) by mouth 2 (two) times daily. 180 tablet 1  . losartan (COZAAR) 50 MG tablet Take 1 tablet Daily for BP 90 tablet 3  . Multiple Vitamin (MULTIVITAMIN) tablet Take 1 tablet by mouth daily.    . Omega-3 Fatty Acids (FISH OIL PO) Take 1 capsule by mouth 2 (two) times daily.    Marland Kitchen omeprazole (PRILOSEC) 40 MG capsule Take 1 capsule by mouth daily.    Marland Kitchen RAPAFLO 8 MG CAPS capsule Take 8 mg by mouth as needed (for urinary retention).     . rosuvastatin (CRESTOR) 20 MG tablet Take 1 tablet (20 mg total) by mouth daily. 90 tablet 3  . verapamil (CALAN-SR) 120 MG CR tablet TAKE 1 TABLET BY MOUTH AT  BEDTIME OR AS DIRECTED. 90 tablet 1  . vitamin C (ASCORBIC ACID) 500 MG tablet Take 500 mg by mouth daily.     No current facility-administered medications on file prior to visit.     Current Problems (verified) Patient Active Problem List    Diagnosis Date Noted  . Insomnia 08/09/2018  . HNP (herniated nucleus pulposus), cervical 07/16/2018  . Aortic atherosclerosis ([place]) 10/11/2017  . History of SCC (squamous cell carcinoma) of skin 10/04/2016  . Atrial fibrillation ([place]) 08/01/2016  . Obesity (BMI 30.0-34.9) 09/30/2013  . Essential hypertension   . Hyperlipidemia, mixed   . BPH with obstruction/lower urinary tract symptoms   . Abnormal glucose   . IBS (irritable bowel syndrome)   . Vitamin D deficiency     Screening Tests Immunization History  Administered Date(s) Administered  . DTaP 07/11/2005  . Influenza Whole 04/02/2013  . Influenza, High Dose Seasonal PF 05/01/2014, 05/21/2015, 03/29/2016, 05/31/2017, 05/05/2018  . Pneumococcal Conjugate-13 05/01/2014  . Pneumococcal Polysaccharide-23 09/08/2008, 03/29/2016  .  Td 12/16/2015  . Zoster 12/09/2008   Preventative care: Last colonoscopy: 2008 Dr. Deatra Ina  Cologuard 05/2018 due 2022 Stress test: 2018 Echo 08/2016  CXR 09/2016  Prior vaccinations: TD or Tdap: 2017 Influenza: 2018 Pneumococcal: 2010, 2017 Prevnar 13: 2015 Shingles/Zostavax: 2010  Names of Other Physician/Practitioners you currently use: 1. [facility] here for primary care 2. Dr. Satira Sark, eye doctor, visit yearly 2019 3. Dr. Jolayne Panther, dentist, q 6 months 2019  Patient Care Team: Unk Pinto, MD as PCP - General (Internal Medicine) Thompson Grayer, MD as PCP - Cardiology (Cardiology) Charlotte Crumb, MD as Consulting Physician (Orthopedic Surgery) Inda Castle, MD (Inactive) as Consulting Physician (Gastroenterology) Alexis Frock, MD as Consulting Physician (Urology) Thompson Grayer, MD as Consulting Physician (Cardiology)   History reviewed: allergies, current medications, past family history, past medical history, past social history, past surgical history and problem list  Allergies Allergies  Allergen Reactions  . Xarelto  [Rivaroxaban] Other (See Comments)    Severe body aches  . Atorvastatin Other (See Comments)    Myalgias  . Clonazepam Other (See Comments)    "Made me jittery"  . Tamsulosin Hcl Other (See Comments)    Hypotension    SURGICAL HISTORY He  has a past surgical history that includes Appendectomy (02-07-2007); Anterior cervical decomp/discectomy fusion (01-25-2002); LEFT INGUINAL HERNIA REPAIR (1970); Umbilical hernia repair (2003); LEFT ROTATOR CUFF REPAIR (2002); Anterior cruciate ligament repair (1990); Cervical fusion (2000); LEFT ANKLE SURG. (1980); Hip surgery (1999); Colonoscopy (05-30-2011); Tonsillectomy; Trigger finger release (Right, 07/17/2013); Tendon reconstruction (Left, 07/17/2013); LOOP RECORDER INSERTION (N/A, 09/19/2016); Hernia repair; Anterior cervical decomp/discectomy fusion (Bilateral, 07/16/2018); and Carpal tunnel release (Right, 07/16/2018). FAMILY HISTORY His family history includes Alcohol abuse in his father; Diabetes in his father; Heart attack (age of onset: 57) in his father; Heart disease in his father and mother; Stroke (age of onset: 90) in his mother. SOCIAL HISTORY He  reports that he has never smoked. He has never used smokeless tobacco. He reports current alcohol use of about 4.0 standard drinks of alcohol per week. He reports that he does not use drugs.  MEDICARE WELLNESS OBJECTIVES: Physical activity: Current Exercise Habits: The patient does not participate in regular exercise at present Cardiac risk factors: Cardiac Risk Factors include: advanced age (>71men, >21 women);dyslipidemia;hypertension;male gender;sedentary lifestyle;obesity (BMI >30kg/m2) Depression/mood screen:   Depression screen [facility] 2/9 02/18/2019  Decreased Interest 0  Down, Depressed, Hopeless 0  PHQ - 2 Score 0    ADLs:  In your present state of health, do you have any difficulty performing the following activities: 02/18/2019 07/06/2018  Hearing? N N  Vision? N N  Difficulty concentrating  or making decisions? N N  Walking or climbing stairs? Y Y  Dressing or bathing? N N  Doing errands, shopping? N N  Some recent data might be hidden     Cognitive Testing  Alert? Yes  Normal Appearance?Yes  Oriented to person? Yes  Place? Yes   Time? Yes  Recall of three objects?  Yes  Can perform simple calculations? Yes  Displays appropriate judgment?Yes  Can read the correct time from a watch face?Yes  EOL planning: Does Patient Have a Medical Advance Directive?: Yes Type of Advance Directive: Healthcare Power of Attorney, Living will Does patient want to make changes to medical advance directive?: No - Patient declined Copy of [place] in Chart?: No - copy requested   Objective:   Blood pressure 120/80, pulse 70, temperature (!) 97.3  F (36.3 C), height 6' (1.829 m), weight 232 lb 9.6 oz (105.5 kg), SpO2 96 %. Body mass index is 31.55 kg/m.  General appearance: alert, no distress, WD/WN, male HEENT: normocephalic, sclerae anicteric, TMs pearly, nares patent, no discharge or erythema, pharynx normal Oral cavity: MMM, no lesions Neck: supple, no lymphadenopathy, no thyromegaly, no masses Heart: RRR, normal S1, S2, no murmurs Lungs: CTA bilaterally, no wheezes, rhonchi, or rales Abdomen: +bs, soft, non tender, non distended, no masses, no hepatomegaly, no splenomegaly Musculoskeletal: nontender, right knee with obvious bony enlargement and effusion without injection or heat Extremities: no edema, no cyanosis, no clubbing Pulses: 2+ symmetric, upper and lower extremities, normal cap refill Neurological: alert, oriented x 3, CN2-12 intact, strength normal upper extremities and lower extremities, sensation normal throughout, DTRs 2+ throughout, no cerebellar signs, gait antalgic Psychiatric: normal affect, behavior normal, pleasant   Medicare Attestation I have personally reviewed: The patient's medical and social history Their use of alcohol, tobacco or  illicit drugs Their current medications and supplements The patient's functional ability including ADLs,fall risks, home safety risks, cognitive, and hearing and visual impairment Diet and physical activities Evidence for depression or mood disorders  The patient's weight, height, BMI, and visual acuity have been recorded in the chart.  I have made referrals, counseling, and provided education to the patient based on review of the above and I have provided the patient with a written personalized care plan for preventive services.     Vicie Mutters, PA-C   02/18/2019

## 2019-02-13 NOTE — Progress Notes (Signed)
[facility] Summary Report / Loop Recorder 

## 2019-02-14 DIAGNOSIS — M79652 Pain in left thigh: Secondary | ICD-10-CM | POA: Diagnosis not present

## 2019-02-14 DIAGNOSIS — M7632 Iliotibial band syndrome, left leg: Secondary | ICD-10-CM | POA: Diagnosis not present

## 2019-02-14 DIAGNOSIS — M7981 Nontraumatic hematoma of soft tissue: Secondary | ICD-10-CM | POA: Diagnosis not present

## 2019-02-16 ENCOUNTER — Other Ambulatory Visit: Payer: Self-pay | Admitting: Internal Medicine

## 2019-02-18 ENCOUNTER — Encounter: Payer: Self-pay | Admitting: Physician Assistant

## 2019-02-18 ENCOUNTER — Ambulatory Visit (INDEPENDENT_AMBULATORY_CARE_PROVIDER_SITE_OTHER): Payer: Medicare Other | Admitting: Physician Assistant

## 2019-02-18 ENCOUNTER — Other Ambulatory Visit: Payer: Self-pay

## 2019-02-18 VITALS — BP 120/80 | HR 70 | Temp 97.3°F | Ht 72.0 in | Wt 232.6 lb

## 2019-02-18 DIAGNOSIS — Z79899 Other long term (current) drug therapy: Secondary | ICD-10-CM

## 2019-02-18 DIAGNOSIS — E669 Obesity, unspecified: Secondary | ICD-10-CM

## 2019-02-18 DIAGNOSIS — R6889 Other general symptoms and signs: Secondary | ICD-10-CM

## 2019-02-18 DIAGNOSIS — I10 Essential (primary) hypertension: Secondary | ICD-10-CM | POA: Diagnosis not present

## 2019-02-18 DIAGNOSIS — N401 Enlarged prostate with lower urinary tract symptoms: Secondary | ICD-10-CM | POA: Diagnosis not present

## 2019-02-18 DIAGNOSIS — Z0001 Encounter for general adult medical examination with abnormal findings: Secondary | ICD-10-CM

## 2019-02-18 DIAGNOSIS — R7309 Other abnormal glucose: Secondary | ICD-10-CM | POA: Diagnosis not present

## 2019-02-18 DIAGNOSIS — Z85828 Personal history of other malignant neoplasm of skin: Secondary | ICD-10-CM | POA: Diagnosis not present

## 2019-02-18 DIAGNOSIS — N138 Other obstructive and reflux uropathy: Secondary | ICD-10-CM | POA: Diagnosis not present

## 2019-02-18 DIAGNOSIS — M79652 Pain in left thigh: Secondary | ICD-10-CM | POA: Diagnosis not present

## 2019-02-18 DIAGNOSIS — E559 Vitamin D deficiency, unspecified: Secondary | ICD-10-CM

## 2019-02-18 DIAGNOSIS — Z0000 Encounter for general adult medical examination without abnormal findings: Secondary | ICD-10-CM

## 2019-02-18 DIAGNOSIS — Z1159 Encounter for screening for other viral diseases: Secondary | ICD-10-CM | POA: Diagnosis not present

## 2019-02-18 DIAGNOSIS — I700 Atherosclerosis of aorta: Secondary | ICD-10-CM

## 2019-02-18 DIAGNOSIS — E782 Mixed hyperlipidemia: Secondary | ICD-10-CM | POA: Diagnosis not present

## 2019-02-18 DIAGNOSIS — G4700 Insomnia, unspecified: Secondary | ICD-10-CM | POA: Diagnosis not present

## 2019-02-18 DIAGNOSIS — M7632 Iliotibial band syndrome, left leg: Secondary | ICD-10-CM | POA: Diagnosis not present

## 2019-02-18 DIAGNOSIS — K589 Irritable bowel syndrome without diarrhea: Secondary | ICD-10-CM

## 2019-02-18 DIAGNOSIS — I480 Paroxysmal atrial fibrillation: Secondary | ICD-10-CM

## 2019-02-18 DIAGNOSIS — M7981 Nontraumatic hematoma of soft tissue: Secondary | ICD-10-CM | POA: Diagnosis not present

## 2019-02-18 MED ORDER — METRONIDAZOLE 0.75 % EX CREA: TOPICAL_CREAM | Freq: Two times a day (BID) | CUTANEOUS | 1 refills | Status: DC

## 2019-02-18 NOTE — Patient Instructions (Addendum)
Silent reflux: Not all heartburn burns...Marland KitchenMarland KitchenMarland Kitchen  What is LPR? Laryngopharyngeal reflux (LPR) or silent reflux is a condition in which acid that is made in the stomach travels up the esophagus (swallowing tube) and gets to the throat. Not everyone with reflux has a lot of heartburn or indigestion. In fact, many people with LPR never have heartburn. This is why LPR is called SILENT REFLUX, and the terms "Silent reflux" and "LPR" are often used interchangeably. Because LPR is silent, it is sometimes difficult to diagnose.  How can you tell if you have LPR?  Marland Kitchen Chronic hoarseness- Some people have hoarseness that comes and goes . throat clearing  . Cough . It can cause shortness of breath and cause asthma like symptoms. Marland Kitchen a feeling of a lump in the throat  . difficulty swallowing . a problem with too much nose and throat drainage.  . Some people will feel their esophagus spasm which feels like their heart beating hard and fast, this will usually be after a meal, at rest, or lying down at night.    How do I treat this? Treatment for LPR should be individualized, and your doctor will suggest the best treatment for you. Generally there are several treatments for LPR: . changing habits and diet to reduce reflux,  . medications to reduce stomach acid, and  . surgery to prevent reflux. Most people with LPR need to modify how and when they eat, as well as take some medication, to get well. Sometimes, nonprescription liquid antacids, such as Maalox, Gelucil and Mylanta are recommended. When used, these antacids should be taken four times each day - one tablespoon one hour after each meal and before bedtime. Dietary and lifestyle changes alone are not often enough to control LPR - medications that reduce stomach acid are also usually needed. These must be prescribed by our doctor.   TIPS FOR REDUCING REFLUX AND LPR Control your LIFE-STYLE and your DIET! Marland Kitchen If you use tobacco, QUIT.  Marland Kitchen Smoking makes  you reflux. After every cigarette you have some LPR.  . Don't wear clothing that is too tight, especially around the waist (trousers, corsets, belts).  . Do not lie down just after eating...in fact, do not eat within three hours of bedtime.  . You should be on a low-fat diet.  . Limit your intake of red meat.  . Limit your intake of butter.  Marland Kitchen Avoid fried foods.  . Avoid chocolate  . Avoid cheese.  Marland Kitchen Avoid eggs. Marland Kitchen Specifically avoid caffeine (especially coffee and tea), soda pop (especially cola) and mints.  Avoid alcoholic beverages, particularly in the evening.   Ask insurance and pharmacy about shingrix - new vaccine   Can go to [employer].com.br for more information  Shingrix Vaccination  Two vaccines are licensed and recommended to prevent shingles in the [place] Zoster vaccine live (ZVL, Zostavax) has been in use since 2006. Recombinant zoster vaccine (RZV, Shingrix), has been in use since 2017 and is recommended by ACIP as the preferred shingles vaccine.  What Everyone Should Know about Shingles Vaccine (Shingrix) One of the Recommended Vaccines by Disease Shingles vaccination is the only way to protect against shingles and postherpetic neuralgia (PHN), the most common complication from shingles. CDC recommends that healthy adults 50 years and older get two doses of the shingles vaccine called Shingrix (recombinant zoster vaccine), separated by 2 to 6 months, to prevent shingles and the complications from the disease. Your doctor or pharmacist can give you Shingrix as  a shot in your upper arm. Shingrix provides strong protection against shingles and PHN. Two doses of Shingrix is more than 90% effective at preventing shingles and PHN. Protection stays above 85% for at least the first four years after you get vaccinated. Shingrix is the preferred vaccine, over Zostavax (zoster vaccine live), a shingles vaccine in use since 2006.  Zostavax may still be used to prevent shingles in healthy adults 60 years and older. For example, you could use Zostavax if a person is allergic to Shingrix, prefers Zostavax, or requests immediate vaccination and Shingrix is unavailable. Who Should Get Shingrix? Healthy adults 50 years and older should get two doses of Shingrix, separated by 2 to 6 months. You should get Shingrix even if in the past you . had shingles  . received Zostavax  . are not sure if you had chickenpox There is no maximum age for getting Shingrix. If you had shingles in the past, you can get Shingrix to help prevent future occurrences of the disease. There is no specific length of time that you need to wait after having shingles before you can receive Shingrix, but generally you should make sure the shingles rash has gone away before getting vaccinated. You can get Shingrix whether or not you remember having had chickenpox in the past. Studies show that more than 99% of Americans 40 years and older have had chickenpox, even if they don't remember having the disease. Chickenpox and shingles are related because they are caused by the same virus (varicella zoster virus). After a person recovers from chickenpox, the virus stays dormant (inactive) in the body. It can reactivate years later and cause shingles. If you had Zostavax in the recent past, you should wait at least eight weeks before getting Shingrix. Talk to your healthcare provider to determine the best time to get Shingrix. Shingrix is available in [employer] and pharmacies. To find doctor's offices or pharmacies near you that offer the vaccine, visit HealthMap Vaccine FinderExternal. If you have questions about Shingrix, talk with your healthcare provider. Vaccine for Those 69 Years and Older  Shingrix reduces the risk of shingles and PHN by more than 90% in people 36 and older. CDC recommends the vaccine for healthy adults 48 and older.  Who Should Not Get  Shingrix? You should not get Shingrix if you: . have ever had a severe allergic reaction to any component of the vaccine or after a dose of Shingrix  . tested negative for immunity to varicella zoster virus. If you test negative, you should get chickenpox vaccine.  . currently have shingles  . currently are pregnant or breastfeeding. Women who are pregnant or breastfeeding should wait to get Shingrix.  Marland Kitchen receive specific antiviral drugs (acyclovir, famciclovir, or valacyclovir) 24 hours before vaccination (avoid use of these antiviral drugs for 14 days after vaccination)- zoster vaccine live only If you have a minor acute (starts suddenly) illness, such as a cold, you may get Shingrix. But if you have a moderate or severe acute illness, you should usually wait until you recover before getting the vaccine. This includes anyone with a temperature of 101.55F or higher. The side effects of the Shingrix are temporary, and usually last 2 to 3 days. While you may experience pain for a few days after getting Shingrix, the pain will be less severe than having shingles and the complications from the disease. How Well Does Shingrix Work? Two doses of Shingrix provides strong protection against shingles and postherpetic neuralgia (PHN), the  most common complication of shingles. . In adults 44 to 73 years old who got two doses, Shingrix was 97% effective in preventing shingles; among adults 70 years and older, Shingrix was 91% effective.  . In adults 64 to 73 years old who got two doses, Shingrix was 91% effective in preventing PHN; among adults 70 years and older, Shingrix was 89% effective. Shingrix protection remained high (more than 85%) in people 70 years and older throughout the four years following vaccination. Since your risk of shingles and PHN increases as you get older, it is important to have strong protection against shingles in your older years. Top of Page  What Are the Possible Side Effects of  Shingrix? Studies show that Shingrix is safe. The vaccine helps your body create a strong defense against shingles. As a result, you are likely to have temporary side effects from getting the shots. The side effects may affect your ability to do normal daily activities for 2 to 3 days. Most people got a sore arm with mild or moderate pain after getting Shingrix, and some also had redness and swelling where they got the shot. Some people felt tired, had muscle pain, a headache, shivering, fever, stomach pain, or nausea. About 1 out of 6 people who got Shingrix experienced side effects that prevented them from doing regular activities. Symptoms went away on their own in about 2 to 3 days. Side effects were more common in younger people. You might have a reaction to the first or second dose of Shingrix, or both doses. If you experience side effects, you may choose to take over-the-counter pain medicine such as ibuprofen or acetaminophen. If you experience side effects from Shingrix, you should report them to the Vaccine Adverse Event Reporting System (VAERS). Your doctor might file this report, or you can do it yourself through the VAERS websiteExternal, or by calling [phone]. If you have any questions about side effects from Shingrix, talk with your doctor. The shingles vaccine does not contain thimerosal (a preservative containing mercury). Top of Page  When Should I See a Doctor Because of the Side Effects I Experience From Shingrix? In clinical trials, Shingrix was not associated with serious adverse events. In fact, serious side effects from vaccines are extremely rare. For example, for every 1 million doses of a vaccine given, only one or two people may have a severe allergic reaction. Signs of an allergic reaction happen within minutes or hours after vaccination and include hives, swelling of the face and throat, difficulty breathing, a fast heartbeat, dizziness, or weakness. If you experience  these or any other life-threatening symptoms, see a doctor right away. Shingrix causes a strong response in your immune system, so it may produce short-term side effects more intense than you are used to from other vaccines. These side effects can be uncomfortable, but they are expected and usually go away on their own in 2 or 3 days. Top of Page  How Can I Pay For Shingrix? There are several ways shingles vaccine may be paid for: Medicare . Medicare Part D plans cover the shingles vaccine, but there may be a cost to you depending on your plan. There may be a copay for the vaccine, or you may need to pay in full then get reimbursed for a certain amount.  . Medicare Part B does not cover the shingles vaccine. Medicaid . Medicaid may or may not cover the vaccine. Contact your insurer to find out. Private health insurance . Many private  health insurance plans will cover the vaccine, but there may be a cost to you depending on your plan. Contact your insurer to find out. Vaccine assistance programs . Some pharmaceutical companies provide vaccines to eligible adults who cannot afford them. You may want to check with the vaccine manufacturer, [employer], about Shingrix. If you do not currently have health insurance, learn more about affordable health coverage optionsExternal. To find doctor's offices or pharmacies near you that offer the vaccine, visit HealthMap Vaccine FinderExternal.

## 2019-02-18 NOTE — Telephone Encounter (Signed)
Age 73, weight 117kg, SCr 0.8 on 11/28/18, afib indication, last OV June 2020

## 2019-02-19 LAB — COMPLETE METABOLIC PANEL WITH GFR
AG Ratio: 1.8 (calc) (ref 1.0–2.5)
ALT: 23 U/L (ref 9–46)
AST: 20 U/L (ref 10–35)
Albumin: 4.4 g/dL (ref 3.6–5.1)
Alkaline phosphatase (APISO): 114 U/L (ref 35–144)
BUN: 17 mg/dL (ref 7–25)
CO2: 27 mmol/L (ref 20–32)
Calcium: 9.8 mg/dL (ref 8.6–10.3)
Chloride: 105 mmol/L (ref 98–110)
Creat: 0.74 mg/dL (ref 0.70–1.18)
GFR, Est African American: 106 mL/min/{1.73_m2} (ref >=60)
GFR, Est Non African American: 92 mL/min/{1.73_m2} (ref >=60)
Globulin: 2.5 g/dL (calc) (ref 1.9–3.7)
Glucose, Bld: 81 mg/dL (ref 65–99)
Potassium: 4.1 mmol/L (ref 3.5–5.3)
Sodium: 140 mmol/L (ref 135–146)
Total Bilirubin: 0.5 mg/dL (ref 0.2–1.2)
Total Protein: 6.9 g/dL (ref 6.1–8.1)

## 2019-02-19 LAB — CBC WITH DIFFERENTIAL/PLATELET
Absolute Monocytes: 612 cells/uL (ref 200–950)
Basophils Absolute: 68 cells/uL (ref 0–200)
Basophils Relative: 1 %
Eosinophils Absolute: 258 cells/uL (ref 15–500)
Eosinophils Relative: 3.8 %
HCT: 47.9 % (ref 38.5–50.0)
Hemoglobin: 16.1 g/dL (ref 13.2–17.1)
Lymphs Abs: 1850 cells/uL (ref 850–3900)
MCH: 32 pg (ref 27.0–33.0)
MCHC: 33.6 g/dL (ref 32.0–36.0)
MCV: 95.2 fL (ref 80.0–100.0)
MPV: 10.1 fL (ref 7.5–12.5)
Monocytes Relative: 9 %
Neutro Abs: 4012 cells/uL (ref 1500–7800)
Neutrophils Relative %: 59 %
Platelets: 220 10*3/uL (ref 140–400)
RBC: 5.03 10*6/uL (ref 4.20–5.80)
RDW: 12.9 % (ref 11.0–15.0)
Total Lymphocyte: 27.2 %
WBC: 6.8 10*3/uL (ref 3.8–10.8)

## 2019-02-19 LAB — TSH: TSH: 1.1 mIU/L (ref 0.40–4.50)

## 2019-02-19 LAB — LIPID PANEL
Cholesterol: 188 mg/dL (ref <200)
HDL: 45 mg/dL (ref >=40)
LDL Cholesterol (Calc): 112 mg/dL (calc) — ABNORMAL HIGH
Non-HDL Cholesterol (Calc): 143 mg/dL (calc) — ABNORMAL HIGH (ref <130)
Total CHOL/HDL Ratio: 4.2 (calc) (ref <5.0)
Triglycerides: 195 mg/dL — ABNORMAL HIGH (ref <150)

## 2019-02-19 LAB — HEPATITIS C ANTIBODY
Hepatitis C Ab: NONREACTIVE
SIGNAL TO CUT-OFF: 0.01 (ref <1.00)

## 2019-02-19 LAB — MAGNESIUM: Magnesium: 1.9 mg/dL (ref 1.5–2.5)

## 2019-02-20 DIAGNOSIS — M7981 Nontraumatic hematoma of soft tissue: Secondary | ICD-10-CM | POA: Diagnosis not present

## 2019-02-20 DIAGNOSIS — M7632 Iliotibial band syndrome, left leg: Secondary | ICD-10-CM | POA: Diagnosis not present

## 2019-02-20 DIAGNOSIS — M79652 Pain in left thigh: Secondary | ICD-10-CM | POA: Diagnosis not present

## 2019-02-28 ENCOUNTER — Ambulatory Visit (INDEPENDENT_AMBULATORY_CARE_PROVIDER_SITE_OTHER): Payer: Medicare Other | Admitting: *Deleted

## 2019-02-28 DIAGNOSIS — I480 Paroxysmal atrial fibrillation: Secondary | ICD-10-CM

## 2019-02-28 DIAGNOSIS — R002 Palpitations: Secondary | ICD-10-CM

## 2019-02-28 LAB — CUP PACEART REMOTE DEVICE CHECK
Date Time Interrogation Session: 20200820122519
Implantable Pulse Generator Implant Date: 20180312

## 2019-03-07 ENCOUNTER — Ambulatory Visit (INDEPENDENT_AMBULATORY_CARE_PROVIDER_SITE_OTHER): Payer: Medicare Other | Admitting: Podiatry

## 2019-03-07 ENCOUNTER — Other Ambulatory Visit: Payer: Self-pay

## 2019-03-07 ENCOUNTER — Encounter: Payer: Self-pay | Admitting: Podiatry

## 2019-03-07 DIAGNOSIS — L03031 Cellulitis of right toe: Secondary | ICD-10-CM | POA: Diagnosis not present

## 2019-03-07 NOTE — Patient Instructions (Addendum)

## 2019-03-08 DIAGNOSIS — M7981 Nontraumatic hematoma of soft tissue: Secondary | ICD-10-CM | POA: Diagnosis not present

## 2019-03-08 DIAGNOSIS — M79652 Pain in left thigh: Secondary | ICD-10-CM | POA: Diagnosis not present

## 2019-03-08 DIAGNOSIS — M7632 Iliotibial band syndrome, left leg: Secondary | ICD-10-CM | POA: Diagnosis not present

## 2019-03-08 NOTE — Progress Notes (Signed)
[facility] Summary Report / Loop Recorder 

## 2019-03-09 ENCOUNTER — Encounter: Payer: Self-pay | Admitting: Podiatry

## 2019-03-09 NOTE — Progress Notes (Signed)
He presents today after having not seen him in the past couple of years with a chief complaint of pain to the great toe of the right foot states that the toenail appears to be coming off is started to split into the split all the way back part of it fell off and now the rest of it seems to be peeling off.  It started this about 10 days ago.  ROS: Denies fever chills nausea vomiting muscle aches pains calf pain back pain chest pain shortness of breath headache.   Objective: Vital signs are stable alert and oriented x3.  Pulses are palpable.  Neurologic sensorium is intact.  Deep tendon reflexes are intact.  Muscle strength is normal symmetrical.  Orthopedic evaluation demonstrates mild flexible hammertoe deformities bilateral.  Cutaneous evaluation demonstrates supple well-hydrated cutis nail plates appear to be normal with exception of the hallux right which does demonstrate what appears to be a nail dystrophy with a portion of the nail nearly avulsed.  No signs of infection.  Assessment: Partial traumatic nail avulsion hallux right.  Plan: After local anesthetic was administered he we performed a partial nail avulsion removing the portion of the nail that was loose and fragmented.  The area was copiously lavaged with normal sterile saline dry sterile compressive dressing was applied he will start soaking tomorrow.  He was provided with both oral and written home-going instruction for the care and soaking of the toe as well as a prescription for Cortisporin Otic to be applied twice daily after soaking.

## 2019-03-11 DIAGNOSIS — M7632 Iliotibial band syndrome, left leg: Secondary | ICD-10-CM | POA: Diagnosis not present

## 2019-03-20 ENCOUNTER — Ambulatory Visit (HOSPITAL_COMMUNITY): Payer: Medicare Other | Admitting: Physician Assistant

## 2019-03-26 ENCOUNTER — Other Ambulatory Visit: Payer: Self-pay | Admitting: Podiatry

## 2019-03-26 ENCOUNTER — Ambulatory Visit (INDEPENDENT_AMBULATORY_CARE_PROVIDER_SITE_OTHER): Payer: Medicare Other

## 2019-03-26 ENCOUNTER — Ambulatory Visit (INDEPENDENT_AMBULATORY_CARE_PROVIDER_SITE_OTHER): Payer: Medicare Other | Admitting: Podiatry

## 2019-03-26 ENCOUNTER — Encounter: Payer: Self-pay | Admitting: Podiatry

## 2019-03-26 ENCOUNTER — Other Ambulatory Visit: Payer: Self-pay

## 2019-03-26 DIAGNOSIS — M7750 Other enthesopathy of unspecified foot: Secondary | ICD-10-CM

## 2019-03-26 DIAGNOSIS — M109 Gout, unspecified: Secondary | ICD-10-CM

## 2019-03-26 DIAGNOSIS — M7632 Iliotibial band syndrome, left leg: Secondary | ICD-10-CM | POA: Diagnosis not present

## 2019-03-26 DIAGNOSIS — Z9889 Other specified postprocedural states: Secondary | ICD-10-CM

## 2019-03-26 DIAGNOSIS — M79652 Pain in left thigh: Secondary | ICD-10-CM | POA: Diagnosis not present

## 2019-03-26 DIAGNOSIS — L03031 Cellulitis of right toe: Secondary | ICD-10-CM

## 2019-03-26 DIAGNOSIS — M7981 Nontraumatic hematoma of soft tissue: Secondary | ICD-10-CM | POA: Diagnosis not present

## 2019-03-26 MED ORDER — METHYLPREDNISOLONE 4 MG PO TBPK: ORAL_TABLET | ORAL | 0 refills | Status: DC

## 2019-03-26 NOTE — Progress Notes (Signed)
He presents today for follow-up of the nail on his right foot however he states that he is having pain on the lateral aspect of the left foot he just woke up with it yesterday he denies having gout before other than in his left knee.  He denies any trauma to his foot.  Objective: Vital signs are stable he is alert and oriented x3 lateral aspect of his left foot inframalleolar fibular site appears to be swollen and exquisitely tender pinpoint with erythema.  Peroneus longus and peroneus brevis tendons work with no complications.  Just direct pain on palpation.  Radiographs taken do not demonstrate any significant osseous abnormalities in the area.  Assessment: Pain in limb secondary to probable gouty capsulitis or peroneal tendinitis.  Plan: Discussed etiology pathology and surgical therapies at this point time put him on a Medrol Dosepak and injected directly into the painful area with 20 mg Kenalog 5 mg of Marcaine.  He tolerated procedure well without complications.  Also requested an arthritic profile.  I will follow-up with him in 1 month.

## 2019-03-28 LAB — CBC WITH DIFFERENTIAL/PLATELET
Absolute Monocytes: 753 cells/uL (ref 200–950)
Basophils Absolute: 74 cells/uL (ref 0–200)
Basophils Relative: 0.8 %
Eosinophils Absolute: 140 cells/uL (ref 15–500)
Eosinophils Relative: 1.5 %
HCT: 48.6 % (ref 38.5–50.0)
Hemoglobin: 16.3 g/dL (ref 13.2–17.1)
Lymphs Abs: 1395 cells/uL (ref 850–3900)
MCH: 31.5 pg (ref 27.0–33.0)
MCHC: 33.5 g/dL (ref 32.0–36.0)
MCV: 93.8 fL (ref 80.0–100.0)
MPV: 10.5 fL (ref 7.5–12.5)
Monocytes Relative: 8.1 %
Neutro Abs: 6938 cells/uL (ref 1500–7800)
Neutrophils Relative %: 74.6 %
Platelets: 215 10*3/uL (ref 140–400)
RBC: 5.18 10*6/uL (ref 4.20–5.80)
RDW: 12.8 % (ref 11.0–15.0)
Total Lymphocyte: 15 %
WBC: 9.3 10*3/uL (ref 3.8–10.8)

## 2019-03-28 LAB — ANA: Anti Nuclear Antibody (ANA): NEGATIVE

## 2019-03-28 LAB — RHEUMATOID FACTOR: Rheumatoid fact SerPl-aCnc: <14 IU/mL (ref <14)

## 2019-03-28 LAB — URIC ACID: Uric Acid, Serum: 6.7 mg/dL (ref 4.0–8.0)

## 2019-03-28 LAB — SEDIMENTATION RATE: Sed Rate: 2 mm/h (ref 0–20)

## 2019-03-28 LAB — C-REACTIVE PROTEIN: CRP: 12.7 mg/L — ABNORMAL HIGH (ref <8.0)

## 2019-03-29 ENCOUNTER — Telehealth: Payer: Self-pay | Admitting: Podiatry

## 2019-03-29 NOTE — Telephone Encounter (Signed)
Pt had labs done earlier this week and is calling to follow up and see if we have received the results. Please give patient a call.

## 2019-04-01 ENCOUNTER — Telehealth: Payer: Self-pay | Admitting: *Deleted

## 2019-04-01 ENCOUNTER — Other Ambulatory Visit: Payer: Self-pay | Admitting: Internal Medicine

## 2019-04-01 ENCOUNTER — Encounter: Payer: Self-pay | Admitting: Podiatry

## 2019-04-01 NOTE — Telephone Encounter (Signed)
Pt is requesting lab results.

## 2019-04-02 ENCOUNTER — Encounter: Payer: Self-pay | Admitting: Podiatry

## 2019-04-02 ENCOUNTER — Ambulatory Visit (INDEPENDENT_AMBULATORY_CARE_PROVIDER_SITE_OTHER): Payer: Medicare Other | Admitting: *Deleted

## 2019-04-02 DIAGNOSIS — I480 Paroxysmal atrial fibrillation: Secondary | ICD-10-CM | POA: Diagnosis not present

## 2019-04-02 DIAGNOSIS — M7981 Nontraumatic hematoma of soft tissue: Secondary | ICD-10-CM | POA: Diagnosis not present

## 2019-04-02 DIAGNOSIS — M7632 Iliotibial band syndrome, left leg: Secondary | ICD-10-CM | POA: Diagnosis not present

## 2019-04-02 DIAGNOSIS — M79652 Pain in left thigh: Secondary | ICD-10-CM | POA: Diagnosis not present

## 2019-04-02 LAB — CUP PACEART REMOTE DEVICE CHECK
Date Time Interrogation Session: 20200922154024
Implantable Pulse Generator Implant Date: 20180312

## 2019-04-02 NOTE — Telephone Encounter (Signed)
Inflammatory marker was high thus inflammation.  The Uric acid level was normal but a high normal as it has been in the past.  Probably gout

## 2019-04-02 NOTE — Telephone Encounter (Signed)
Looks like gout

## 2019-04-02 NOTE — Telephone Encounter (Signed)
I informed pt of Dr. Stephenie Acres review of labs, and pt wanted to know if had uric acid crystals.

## 2019-04-04 DIAGNOSIS — I10 Essential (primary) hypertension: Secondary | ICD-10-CM | POA: Diagnosis not present

## 2019-04-04 DIAGNOSIS — I4891 Unspecified atrial fibrillation: Secondary | ICD-10-CM | POA: Diagnosis not present

## 2019-04-04 DIAGNOSIS — R339 Retention of urine, unspecified: Secondary | ICD-10-CM | POA: Diagnosis not present

## 2019-04-04 DIAGNOSIS — Z79899 Other long term (current) drug therapy: Secondary | ICD-10-CM | POA: Diagnosis not present

## 2019-04-04 DIAGNOSIS — R1030 Lower abdominal pain, unspecified: Secondary | ICD-10-CM | POA: Diagnosis not present

## 2019-04-04 DIAGNOSIS — Z87430 Personal history of prostatic dysplasia: Secondary | ICD-10-CM | POA: Diagnosis not present

## 2019-04-04 DIAGNOSIS — Z7901 Long term (current) use of anticoagulants: Secondary | ICD-10-CM | POA: Diagnosis not present

## 2019-04-04 DIAGNOSIS — Z888 Allergy status to other drugs, medicaments and biological substances status: Secondary | ICD-10-CM | POA: Diagnosis not present

## 2019-04-09 DIAGNOSIS — R3912 Poor urinary stream: Secondary | ICD-10-CM | POA: Diagnosis not present

## 2019-04-09 DIAGNOSIS — R338 Other retention of urine: Secondary | ICD-10-CM | POA: Diagnosis not present

## 2019-04-10 ENCOUNTER — Telehealth: Payer: Self-pay | Admitting: Internal Medicine

## 2019-04-10 ENCOUNTER — Encounter: Payer: Self-pay | Admitting: Internal Medicine

## 2019-04-10 ENCOUNTER — Other Ambulatory Visit: Payer: Self-pay | Admitting: Urology

## 2019-04-10 NOTE — Telephone Encounter (Signed)
error 

## 2019-04-10 NOTE — Telephone Encounter (Signed)
Request is to hold eliquis 2 days before and 2 weeks after.

## 2019-04-10 NOTE — Telephone Encounter (Signed)
° ° °  ° °  [facility] Pre-operative Risk Assessment    Request for surgical clearance:  1. What type of surgery is being performed? Prostatectomy  2. When is this surgery scheduled? 05/15/19  3. What type of clearance is required (medical clearance vs. Pharmacy clearance to hold med vs. Both)? Both  4. Are there any medications that need to be held prior to surgery and how long? Eliquis for 48hours  And 2 weeks after  5. Practice name and name of physician performing surgery? Dr Alexis Frock  6. What is your office phone number [phone] ext [phone]   7.   What is your office fax number [phone]  8.   Anesthesia type (None, local, MAC, general) ? GENERAL for 3 hours   Laurier Nancy 04/10/2019, 4:06 PM  _________________________________________________________________   (provider comments below)

## 2019-04-11 NOTE — Telephone Encounter (Signed)
Patient with diagnosis of afib on Eliquis for anticoagulation.    Procedure: Prostatectomy Date of procedure: 05/15/2019  CHADS2-VASc score of  3 ( HTN, AGE,  CAD)  CrCl 111 ml/min  Due to the extended post procedure hold that is being requested (2 days prior and 2 weeks after) I will defer to MD for input.

## 2019-04-15 ENCOUNTER — Other Ambulatory Visit (HOSPITAL_COMMUNITY): Payer: Self-pay | Admitting: Nurse Practitioner

## 2019-04-15 NOTE — Progress Notes (Signed)
[facility] Summary Report / Loop Recorder 

## 2019-04-18 NOTE — Telephone Encounter (Signed)
   Primary Cardiologist: Thompson Grayer, MD  Chart reviewed as part of pre-operative protocol coverage.   Patient with diagnosis of atrial fibrillation on Eliquis for anticoagulation with a CHADS2-VASc score of  3 ( HTN, AGE,  CAD) and a CrCl 111 ml/min  Due to the extended post procedure hold that is being requested (2 days prior and 2 weeks after) pharmacy team deferred back to Dr. Rayann Heman for his recommendations.   Dr. Rayann Heman advised resuming anticoagulation as soon as surgically able in the post-operative setting. Due to the extended request (two weeks post op) which appears to be outside of the standard of care, Dr. Rayann Heman would like to defer back to the surgeon.   Pre-op covering staff: - Please contact requesting surgeon's office via preferred method (i.e, phone, fax) to inform them of Dr. Jackalyn Lombard recommendations regarding the procedure.   Kathyrn Drown, NP 04/18/2019, 8:20 AM

## 2019-04-18 NOTE — Telephone Encounter (Signed)
I would advise resuming anticoagulation as soon as surgically able post operatively.  Two weeks sounds like it would be outside of the standard of care.  I will defer to the surgeon.

## 2019-04-23 DIAGNOSIS — R310 Gross hematuria: Secondary | ICD-10-CM | POA: Diagnosis not present

## 2019-04-23 DIAGNOSIS — N281 Cyst of kidney, acquired: Secondary | ICD-10-CM | POA: Diagnosis not present

## 2019-05-01 ENCOUNTER — Ambulatory Visit (INDEPENDENT_AMBULATORY_CARE_PROVIDER_SITE_OTHER): Payer: Medicare Other

## 2019-05-01 ENCOUNTER — Other Ambulatory Visit: Payer: Self-pay

## 2019-05-01 VITALS — Temp 97.7°F

## 2019-05-01 DIAGNOSIS — Z23 Encounter for immunization: Secondary | ICD-10-CM

## 2019-05-01 NOTE — Progress Notes (Signed)
Patient presents to the office forHDFlu Vaccine. Vaccine administered toLEFTDeltoid withoutanycomplication. Temperature taken and recorded 

## 2019-05-02 DIAGNOSIS — R300 Dysuria: Secondary | ICD-10-CM | POA: Diagnosis not present

## 2019-05-06 ENCOUNTER — Ambulatory Visit (INDEPENDENT_AMBULATORY_CARE_PROVIDER_SITE_OTHER): Payer: Medicare Other | Admitting: *Deleted

## 2019-05-06 DIAGNOSIS — I480 Paroxysmal atrial fibrillation: Secondary | ICD-10-CM

## 2019-05-06 LAB — CUP PACEART REMOTE DEVICE CHECK
Date Time Interrogation Session: 20201025153821
Implantable Pulse Generator Implant Date: 20180312

## 2019-05-07 DIAGNOSIS — N401 Enlarged prostate with lower urinary tract symptoms: Secondary | ICD-10-CM | POA: Diagnosis not present

## 2019-05-07 DIAGNOSIS — R310 Gross hematuria: Secondary | ICD-10-CM | POA: Diagnosis not present

## 2019-05-07 DIAGNOSIS — R338 Other retention of urine: Secondary | ICD-10-CM | POA: Diagnosis not present

## 2019-05-13 DIAGNOSIS — N401 Enlarged prostate with lower urinary tract symptoms: Secondary | ICD-10-CM | POA: Diagnosis not present

## 2019-05-13 DIAGNOSIS — R300 Dysuria: Secondary | ICD-10-CM | POA: Diagnosis not present

## 2019-05-13 DIAGNOSIS — R338 Other retention of urine: Secondary | ICD-10-CM | POA: Diagnosis not present

## 2019-05-17 ENCOUNTER — Encounter (HOSPITAL_COMMUNITY): Payer: Self-pay

## 2019-05-17 NOTE — Patient Instructions (Addendum)
DUE TO COVID-19 ONLY ONE VISITOR IS ALLOWED TO COME WITH YOU AND STAY IN THE WAITING ROOM ONLY DURING PRE OP AND PROCEDURE DAY OF SURGERY. THE 1 VISITOR MAY VISIT WITH YOU AFTER SURGERY IN YOUR PRIVATE ROOM DURING VISITING HOURS ONLY!  YOU NEED TO HAVE A COVID 19 TEST ON__11/7_____ @_______ , THIS TEST MUST BE DONE BEFORE SURGERY, COME  [place] , [phone].  ([place]) ONCE YOUR COVID TEST IS COMPLETED, PLEASE BEGIN THE QUARANTINE INSTRUCTIONS AS OUTLINED IN YOUR HANDOUT.                [name]   Your procedure is scheduled on: 05/22/19   Report to [facility] Main  Entrance   Report to admitting at  10:00 AM     Call this number if you have problems the morning of surgery [phone]    Remember: Do not eat food or drink liquids :After Midnight  . BRUSH YOUR TEETH MORNING OF SURGERY AND RINSE YOUR MOUTH OUT, NO CHEWING GUM CANDY OR MINTS.     Take these medicines the morning of surgery with A SIP OF WATER: Flecinide, Diltiazem, Verapamil, Finesteride, Ditropan                                 You may not have any metal on your body including piercings              Do not wear jewelry, lotions, powders or  deodorant                    Men may shave face and neck.   Do not bring valuables to the hospital. [place].  Contacts, dentures or bridgework may not be worn into surgery.      Special Instructions: N/A              Please read over the following fact sheets you were given: _____________________________________________________________________             [facility] - Preparing for Surgery  Before surgery, you can play an important role.   Because skin is not sterile, your skin needs to be as free of germs as possible.   You can reduce the number of germs on your skin by washing with CHG (chlorahexidine gluconate) soap before surgery.   CHG is an antiseptic  cleaner which kills germs and bonds with the skin to continue killing germs even after washing. Please DO NOT use if you have an allergy to CHG or antibacterial soaps.   If your skin becomes reddened/irritated stop using the CHG and inform your nurse when you arrive at Short Stay.   You may shave your face/neck.  Please follow these instructions carefully:  1.  Shower with CHG Soap the night before surgery and the  morning of Surgery.  2.  If you choose to wash your hair, wash your hair first as usual with your  normal  shampoo.  3.  After you shampoo, rinse your hair and body thoroughly to remove the  shampoo.  4.  Use CHG as you would any other liquid soap.  You can apply chg directly  to the skin and wash                       Gently with a scrungie or clean washcloth.  5.  Apply the CHG Soap to your body ONLY FROM THE NECK DOWN.   Do not use on face/ open                           Wound or open sores. Avoid contact with eyes, ears mouth and genitals (private parts).                       Wash face,  Genitals (private parts) with your normal soap.             6.  Wash thoroughly, paying special attention to the area where your surgery  will be performed.  7.  Thoroughly rinse your body with warm water from the neck down.  8.  DO NOT shower/wash with your normal soap after using and rinsing off  the CHG Soap.             9.  Pat yourself dry with a clean towel.            10.  Wear clean pajamas.            11.  Place clean sheets on your bed the night of your first shower and do not  sleep with pets. Day of Surgery : Do not apply any lotions/deodorants the morning of surgery.  Please wear clean clothes to the hospital/surgery center.  FAILURE TO FOLLOW THESE INSTRUCTIONS MAY RESULT IN THE CANCELLATION OF YOUR SURGERY PATIENT SIGNATURE_________________________________  NURSE  SIGNATURE__________________________________  ________________________________________________________________________   Adam Phenix  An incentive spirometer is a tool that can help keep your lungs clear and active. This tool measures how well you are filling your lungs with each breath. Taking long deep breaths may help reverse or decrease the chance of developing breathing (pulmonary) problems (especially infection) following:  A long period of time when you are unable to move or be active. BEFORE THE PROCEDURE   If the spirometer includes an indicator to show your best effort, your nurse or respiratory therapist will set it to a desired goal.  If possible, sit up straight or lean slightly forward. Try not to slouch.  Hold the incentive spirometer in an upright position. INSTRUCTIONS FOR USE  1. Sit on the edge of your bed if possible, or sit up as far as you can in bed or on a chair. 2. Hold the incentive spirometer in an upright position. 3. Breathe out normally. 4. Place the mouthpiece in your mouth and seal your lips tightly around it. 5. Breathe in slowly and as deeply as possible, raising the piston or the ball toward the top of the column. 6. Hold your breath for 3-5 seconds or for as long as possible. Allow the piston or ball to fall to the bottom of the column. 7. Remove the mouthpiece from your mouth and breathe out normally. 8. Rest for a few seconds and repeat Steps 1 through 7 at least 10 times every 1-2 hours when you are awake. Take your time and take a few normal breaths between deep breaths. 9. The spirometer may include an indicator to show your best effort.  Use the indicator as a goal to work toward during each repetition. 10. After each set of 10 deep breaths, practice coughing to be sure your lungs are clear. If you have an incision (the cut made at the time of surgery), support your incision when coughing by placing a pillow or rolled up towels firmly  against it. Once you are able to get out of bed, walk around indoors and cough well. You may stop using the incentive spirometer when instructed by your caregiver.  RISKS AND COMPLICATIONS  Take your time so you do not get dizzy or light-headed.  If you are in pain, you may need to take or ask for pain medication before doing incentive spirometry. It is harder to take a deep breath if you are having pain. AFTER USE  Rest and breathe slowly and easily.  It can be helpful to keep track of a log of your progress. Your caregiver can provide you with a simple table to help with this. If you are using the spirometer at home, follow these instructions: [place] IF:   You are having difficultly using the spirometer.  You have trouble using the spirometer as often as instructed.  Your pain medication is not giving enough relief while using the spirometer.  You develop fever of 100.5 F (38.1 C) or higher. SEEK IMMEDIATE MEDICAL CARE IF:   You cough up bloody sputum that had not been present before.  You develop fever of 102 F (38.9 C) or greater.  You develop worsening pain at or near the incision site. MAKE SURE YOU:   Understand these instructions.  Will watch your condition.  Will get help right away if you are not doing well or get worse. Document Released: 11/07/2006 Document Revised: 09/19/2011 Document Reviewed: 01/08/2007 [facility] Patient Information 2014 [place], [place].   ________________________________________________________________________

## 2019-05-18 ENCOUNTER — Other Ambulatory Visit (HOSPITAL_COMMUNITY)
Admission: RE | Admit: 2019-05-18 | Discharge: 2019-05-18 | Disposition: A | Discharge status: Home / Self Care | Payer: Medicare Other | Source: Ambulatory Visit | Attending: Urology | Admitting: Urology

## 2019-05-18 DIAGNOSIS — Z20828 Contact with and (suspected) exposure to other viral communicable diseases: Secondary | ICD-10-CM | POA: Insufficient documentation

## 2019-05-18 DIAGNOSIS — Z01812 Encounter for preprocedural laboratory examination: Secondary | ICD-10-CM | POA: Diagnosis not present

## 2019-05-19 LAB — NOVEL CORONAVIRUS, NAA (HOSP ORDER, SEND-OUT TO REF LAB; TAT 18-24 HRS): SARS-CoV-2, NAA: NOT DETECTED

## 2019-05-20 ENCOUNTER — Encounter (HOSPITAL_COMMUNITY)
Admission: RE | Admit: 2019-05-20 | Discharge: 2019-05-20 | Disposition: A | Discharge status: Home / Self Care | Payer: Medicare Other | Source: Ambulatory Visit | Attending: Urology | Admitting: Urology

## 2019-05-20 ENCOUNTER — Encounter (HOSPITAL_COMMUNITY): Payer: Self-pay

## 2019-05-20 ENCOUNTER — Other Ambulatory Visit: Payer: Self-pay

## 2019-05-20 DIAGNOSIS — E669 Obesity, unspecified: Secondary | ICD-10-CM | POA: Insufficient documentation

## 2019-05-20 DIAGNOSIS — Z95810 Presence of automatic (implantable) cardiac defibrillator: Secondary | ICD-10-CM | POA: Insufficient documentation

## 2019-05-20 DIAGNOSIS — Z7952 Long term (current) use of systemic steroids: Secondary | ICD-10-CM | POA: Insufficient documentation

## 2019-05-20 DIAGNOSIS — M4322 Fusion of spine, cervical region: Secondary | ICD-10-CM | POA: Insufficient documentation

## 2019-05-20 DIAGNOSIS — N400 Enlarged prostate without lower urinary tract symptoms: Secondary | ICD-10-CM | POA: Insufficient documentation

## 2019-05-20 DIAGNOSIS — Z7901 Long term (current) use of anticoagulants: Secondary | ICD-10-CM | POA: Insufficient documentation

## 2019-05-20 DIAGNOSIS — R319 Hematuria, unspecified: Secondary | ICD-10-CM | POA: Insufficient documentation

## 2019-05-20 DIAGNOSIS — E559 Vitamin D deficiency, unspecified: Secondary | ICD-10-CM | POA: Insufficient documentation

## 2019-05-20 DIAGNOSIS — R7303 Prediabetes: Secondary | ICD-10-CM | POA: Insufficient documentation

## 2019-05-20 DIAGNOSIS — Z792 Long term (current) use of antibiotics: Secondary | ICD-10-CM | POA: Insufficient documentation

## 2019-05-20 DIAGNOSIS — E785 Hyperlipidemia, unspecified: Secondary | ICD-10-CM | POA: Insufficient documentation

## 2019-05-20 DIAGNOSIS — I4891 Unspecified atrial fibrillation: Secondary | ICD-10-CM | POA: Insufficient documentation

## 2019-05-20 DIAGNOSIS — I10 Essential (primary) hypertension: Secondary | ICD-10-CM | POA: Insufficient documentation

## 2019-05-20 DIAGNOSIS — Z6831 Body mass index (BMI) 31.0-31.9, adult: Secondary | ICD-10-CM | POA: Insufficient documentation

## 2019-05-20 DIAGNOSIS — Z01812 Encounter for preprocedural laboratory examination: Secondary | ICD-10-CM | POA: Insufficient documentation

## 2019-05-20 DIAGNOSIS — R339 Retention of urine, unspecified: Secondary | ICD-10-CM | POA: Insufficient documentation

## 2019-05-20 LAB — CBC
HCT: 49 % (ref 39.0–52.0)
Hemoglobin: 15.6 g/dL (ref 13.0–17.0)
MCH: 31 pg (ref 26.0–34.0)
MCHC: 31.8 g/dL (ref 30.0–36.0)
MCV: 97.4 fL (ref 80.0–100.0)
Platelets: 239 10*3/uL (ref 150–400)
RBC: 5.03 MIL/uL (ref 4.22–5.81)
RDW: 13.2 % (ref 11.5–15.5)
WBC: 12.6 10*3/uL — ABNORMAL HIGH (ref 4.0–10.5)
nRBC: 0 % (ref 0.0–0.2)

## 2019-05-20 LAB — BASIC METABOLIC PANEL
Anion gap: 10 (ref 5–15)
BUN: 15 mg/dL (ref 8–23)
CO2: 22 mmol/L (ref 22–32)
Calcium: 9.3 mg/dL (ref 8.9–10.3)
Chloride: 106 mmol/L (ref 98–111)
Creatinine, Ser: 0.94 mg/dL (ref 0.61–1.24)
GFR calc Af Amer: >60 mL/min (ref >60)
GFR calc non Af Amer: >60 mL/min (ref >60)
Glucose, Bld: 105 mg/dL — ABNORMAL HIGH (ref 70–99)
Potassium: 4.2 mmol/L (ref 3.5–5.1)
Sodium: 138 mmol/L (ref 135–145)

## 2019-05-20 LAB — HEMOGLOBIN A1C
Hgb A1c MFr Bld: 5.1 % (ref 4.8–5.6)
Mean Plasma Glucose: 99.67 mg/dL

## 2019-05-20 NOTE — Progress Notes (Signed)
PCP - Dr. Essie Christine Cardiologist - Dr. Thompson Grayer  Chest x-ray - 02/21/18 EKG - 12/24/18 Stress Test - no ECHO - 08/17/16 Cardiac Cath - no  Sleep Study - no CPAP -   Fasting Blood Sugar - unknown Pt doesn't test. HgbA1cs have been WNL Checks Blood Sugar _____ times a day  Blood Thinner Instructions:Eliquis Aspirin Instructions:stop 3 days prior to DOS Last Dose:05/20/19  Anesthesia review:   Patient denies shortness of breath, fever, cough and chest pain at PAT appointment yes  Patient verbalized understanding of instructions that were given to them at the PAT appointment. Patient was also instructed that they will need to review over the PAT instructions again at home before surgery. yes

## 2019-05-21 NOTE — Progress Notes (Signed)
Anesthesia Chart Review   Case: [id number] Date/Time: 05/22/19 1145   Procedure: XI ROBOTIC ASSISTED SIMPLE PROSTATECTOMY (N/A ) - 3 HRS   Anesthesia type: General   Pre-op diagnosis: MASSIVE PROSTATE , HEMATURIA AND URINARY RETENTION   Location: [facility]   Surgeon: Alexis Frock, MD      DISCUSSION:73 y.o. never smoker with h/o PONV, HLD, HTN, pre-diabetes, A-fib (on Eliquis), AICD (last check 05/05/2019), enlarged prostate with hematuria and urinary retention scheduled for above procedure 05/22/2019 with Dr. Alexis Frock.   Low risk stress test 12/2016.   Surgical clearance sent from Dr. Zettie Pho office to cardiology.  In this request they asked if Eliquis could be held for 48 hours and 2 weeks after procedure.  Per Dr. Rayann Heman, "I would advise resuming anticoagulation as soon as surgically able post operatively.  Two weeks sounds like it would be outside of the standard of care.  I will defer to the surgeon."  Pt will hold Eliquis 2 days prior to procedure with last dose 05/20/2019.   VS: BP (!) 138/97 (BP Location: Left Arm)   Pulse 66   Temp 37 C (Oral)   Resp 18   Ht 6' (1.829 m)   Wt 105.3 kg   SpO2 95%   BMI 31.47 kg/m   PROVIDERS: Unk Pinto, MD is PCP   Thompson Grayer, MD is Cardiologist  LABS: Labs reviewed: Acceptable for surgery. (all labs ordered are listed, but only abnormal results are displayed)  Labs Reviewed  BASIC METABOLIC PANEL - Abnormal; Notable for the following components:      Result Value   Glucose, Bld 105 (*)    All other components within normal limits  CBC - Abnormal; Notable for the following components:   WBC 12.6 (*)    All other components within normal limits  HEMOGLOBIN A1C     IMAGES:   EKG: 12/24/2018 69 bpm Normal sinus rhythm   CV: Myocardial Perfusion 12/14/2016  Nuclear stress EF: 54%.  There was no ST segment deviation noted during stress.  This is a low risk study.  The left ventricular ejection  fraction is mildly decreased (45-54%).   1. EF 54%, normal wall motion.  2. Fixed medium-sized, mild basal to mid inferolateral and inferior perfusion defect.  Given normal inferior wall motion, think most likely diaphragmatic attenuation.  No ischemia.   Low risk study.   Echo 08/17/2016 Study Conclusions  - Left ventricle: The cavity size was normal. Wall thickness was   normal. Systolic function was normal. The estimated ejection   fraction was in the range of 55% to 60%. Wall motion was normal;   there were no regional wall motion abnormalities. Doppler   parameters are consistent with abnormal left ventricular   relaxation (grade 1 diastolic dysfunction). The E/e&' ratio is <8,   suggesting normal LV filling pressure. - Aortic valve: Sclerosis without stenosis. There was no   regurgitation. - Mitral valve: Mildly thickened leaflets . There was trivial   regurgitation. - Left atrium: The atrium was normal in size. - Right atrium: The atrium was normal in size. - Inferior vena cava: The vessel was normal in size. The   respirophasic diameter changes were in the normal range (>= 50%),   consistent with normal central venous pressure.  Impressions:  - LVEF 55-60%, normal wall thickness, normal wall motion, diastolic   dysfunction, normal LV filling pressure, aortic valve sclerosis,   trivial MR, normal LA size, normal IVC. Past  Medical History:  Diagnosis Date  . A-fib ([place])   . AICD (automatic cardioverter/defibrillator) present   . Arthritis   . Benign prostatic hyperplasia   . BPH (benign prostatic hypertrophy)   . Carpal tunnel syndrome    bilateral  . Carpal tunnel syndrome, bilateral   . Early cataract   . History of SCC (squamous cell carcinoma) of skin 10/04/2016  . HNP (herniated nucleus pulposus), cervical   . Hyperlipidemia   . Hypertension   . IBS (irritable bowel syndrome)   . Mild acid reflux occasional  . Obesity   . Other abnormal glucose   .  Pelvic pain in male   . Pneumonia    " walking"  . PONV (postoperative nausea and vomiting)   . Pre-diabetes   . Vitamin D deficiency   . Wears glasses     Past Surgical History:  Procedure Laterality Date  . ANTERIOR CERVICAL DECOMP/DISCECTOMY FUSION  01-25-2002   C4 - C5  . ANTERIOR CERVICAL DECOMP/DISCECTOMY FUSION Bilateral 07/16/2018   Procedure: REMOVAL OF ANTERIOR CERVICAL PLATE, ANTERIOR CERVICAL DECOMPRESSION/DISCECTOMY FUSION CERVICAL THREE- CERVICAL FOUR;  Surgeon: Jovita Gamma, MD;  Location: [place];  Service: Neurosurgery;  Laterality: Bilateral;  REMOVAL OF ANTERIOR CERVICAL PLATE, ANTERIOR CERVICAL DECOMPRESSION/DISCECTOMY FUSION CERVICAL THREE- CERVICAL FOUR  . ANTERIOR CRUCIATE LIGAMENT REPAIR  1990   RIGHT  . APPENDECTOMY  02-07-2007  . CARPAL TUNNEL RELEASE Right 07/16/2018   Procedure: CARPAL TUNNEL RELEASE;  Surgeon: Jovita Gamma, MD;  Location: [place];  Service: Neurosurgery;  Laterality: Right;  CARPAL TUNNEL RELEASE  . CERVICAL FUSION  2000   C5 - 6  . COLONOSCOPY  05-30-2011   HEMORRHOIDS  . HERNIA REPAIR    . HIP SURGERY  1999   LEFT HIP --  REMOVAL CALCIUM BUILD-UP  . LEFT ANKLE SURG.  1980  . LEFT INGUINAL HERNIA REPAIR  1970  . LEFT ROTATOR CUFF REPAIR  2002  . LOOP RECORDER INSERTION N/A 09/19/2016   Procedure: Loop Recorder Insertion;  Surgeon: Thompson Grayer, MD;  Location: [facility] [facility];  Service: Cardiovascular;  Laterality: N/A;  . TENDON RECONSTRUCTION Left 07/17/2013   Procedure: LEFT ELBOW LATERAL RECONSTRUCTION;  Surgeon: Schuyler Amor, MD;  Location: [facility];  Service: Orthopedics;  Laterality: Left;  . TONSILLECTOMY    . TRIGGER FINGER RELEASE Right 07/17/2013   Procedure: RELEASE A-1 PULLEY RIGHT RING FINGER;  Surgeon: Schuyler Amor, MD;  Location: [place];  Service: Orthopedics;  Laterality: Right;  . UMBILICAL HERNIA REPAIR  2003    MEDICATIONS: . ALPRAZolam (XANAX) 1 MG tablet  .  Carboxymethylcellulose Sodium (REFRESH TEARS OP)  . Cholecalciferol (VITAMIN D3) 5000 units CAPS  . diltiazem (CARDIZEM) 30 MG tablet  . ELIQUIS 5 MG TABS tablet  . finasteride (PROSCAR) 5 MG tablet  . Flaxseed, Linseed, (FLAX SEED OIL PO)  . flecainide (TAMBOCOR) 100 MG tablet  . losartan (COZAAR) 50 MG tablet  . methylPREDNISolone (MEDROL DOSEPAK) 4 MG TBPK tablet  . metroNIDAZOLE (METROCREAM) 0.75 % cream  . Multiple Vitamin (MULTIVITAMIN) tablet  . Omega-3 Fatty Acids (FISH OIL PO)  . oxybutynin (DITROPAN) 5 MG tablet  . rosuvastatin (CRESTOR) 20 MG tablet  . verapamil (CALAN-SR) 120 MG CR tablet  . vitamin C (ASCORBIC ACID) 500 MG tablet   No current facility-administered medications for this encounter.     Maia Plan [facility] Pre-Surgical Testing [phone] 05/21/19  12:34 PM

## 2019-05-22 ENCOUNTER — Inpatient Hospital Stay (HOSPITAL_COMMUNITY): Payer: Medicare Other | Admitting: Physician Assistant

## 2019-05-22 ENCOUNTER — Encounter (HOSPITAL_COMMUNITY)
Admission: RE | Disposition: A | Discharge status: Home / Self Care | Payer: Self-pay | Source: Other Acute Inpatient Hospital | Attending: Urology

## 2019-05-22 ENCOUNTER — Encounter (HOSPITAL_COMMUNITY): Payer: Self-pay | Admitting: Certified Registered Nurse Anesthetist

## 2019-05-22 ENCOUNTER — Inpatient Hospital Stay (HOSPITAL_COMMUNITY)
Admission: RE | Admit: 2019-05-22 | Discharge: 2019-05-25 | DRG: 708 | Disposition: A | Discharge status: Home / Self Care | Payer: Medicare Other | Source: Other Acute Inpatient Hospital | Attending: Urology | Admitting: Urology

## 2019-05-22 ENCOUNTER — Other Ambulatory Visit: Payer: Self-pay

## 2019-05-22 ENCOUNTER — Inpatient Hospital Stay (HOSPITAL_COMMUNITY): Payer: Medicare Other | Admitting: Certified Registered Nurse Anesthetist

## 2019-05-22 DIAGNOSIS — E782 Mixed hyperlipidemia: Secondary | ICD-10-CM | POA: Diagnosis not present

## 2019-05-22 DIAGNOSIS — I959 Hypotension, unspecified: Secondary | ICD-10-CM | POA: Diagnosis not present

## 2019-05-22 DIAGNOSIS — Z811 Family history of alcohol abuse and dependence: Secondary | ICD-10-CM

## 2019-05-22 DIAGNOSIS — R310 Gross hematuria: Secondary | ICD-10-CM | POA: Diagnosis present

## 2019-05-22 DIAGNOSIS — I10 Essential (primary) hypertension: Secondary | ICD-10-CM | POA: Diagnosis present

## 2019-05-22 DIAGNOSIS — Z981 Arthrodesis status: Secondary | ICD-10-CM | POA: Diagnosis not present

## 2019-05-22 DIAGNOSIS — R338 Other retention of urine: Secondary | ICD-10-CM | POA: Diagnosis present

## 2019-05-22 DIAGNOSIS — E785 Hyperlipidemia, unspecified: Secondary | ICD-10-CM | POA: Diagnosis present

## 2019-05-22 DIAGNOSIS — R401 Stupor: Secondary | ICD-10-CM | POA: Diagnosis not present

## 2019-05-22 DIAGNOSIS — M1990 Unspecified osteoarthritis, unspecified site: Secondary | ICD-10-CM | POA: Diagnosis present

## 2019-05-22 DIAGNOSIS — Z95810 Presence of automatic (implantable) cardiac defibrillator: Secondary | ICD-10-CM | POA: Diagnosis not present

## 2019-05-22 DIAGNOSIS — I4891 Unspecified atrial fibrillation: Secondary | ICD-10-CM | POA: Diagnosis present

## 2019-05-22 DIAGNOSIS — K219 Gastro-esophageal reflux disease without esophagitis: Secondary | ICD-10-CM | POA: Diagnosis present

## 2019-05-22 DIAGNOSIS — E559 Vitamin D deficiency, unspecified: Secondary | ICD-10-CM | POA: Diagnosis present

## 2019-05-22 DIAGNOSIS — R7303 Prediabetes: Secondary | ICD-10-CM | POA: Diagnosis present

## 2019-05-22 DIAGNOSIS — N400 Enlarged prostate without lower urinary tract symptoms: Secondary | ICD-10-CM | POA: Diagnosis present

## 2019-05-22 DIAGNOSIS — Z833 Family history of diabetes mellitus: Secondary | ICD-10-CM

## 2019-05-22 DIAGNOSIS — Z8249 Family history of ischemic heart disease and other diseases of the circulatory system: Secondary | ICD-10-CM | POA: Diagnosis not present

## 2019-05-22 DIAGNOSIS — N401 Enlarged prostate with lower urinary tract symptoms: Secondary | ICD-10-CM | POA: Diagnosis present

## 2019-05-22 DIAGNOSIS — Z888 Allergy status to other drugs, medicaments and biological substances status: Secondary | ICD-10-CM | POA: Diagnosis not present

## 2019-05-22 DIAGNOSIS — Z823 Family history of stroke: Secondary | ICD-10-CM

## 2019-05-22 HISTORY — PX: XI ROBOTIC ASSISTED SIMPLE PROSTATECTOMY: SHX6713

## 2019-05-22 LAB — HEMOGLOBIN AND HEMATOCRIT, BLOOD
HCT: 48.6 % (ref 39.0–52.0)
Hemoglobin: 15.3 g/dL (ref 13.0–17.0)

## 2019-05-22 SURGERY — PROSTATECTOMY, SIMPLE, ROBOT-ASSISTED
Anesthesia: General

## 2019-05-22 MED ORDER — MAGNESIUM CITRATE PO SOLN: 1.0000 | Freq: Once | ORAL | Status: DC

## 2019-05-22 MED ORDER — ONDANSETRON HCL 4 MG/2ML IJ SOLN
INTRAMUSCULAR | Status: AC
  Filled 2019-05-22: qty 2

## 2019-05-22 MED ORDER — PROPOFOL 10 MG/ML IV BOLUS
INTRAVENOUS | Status: AC
  Filled 2019-05-22: qty 20

## 2019-05-22 MED ORDER — FENTANYL CITRATE (PF) 250 MCG/5ML IJ SOLN
INTRAMUSCULAR | Status: DC | PRN
  Administered 2019-05-22 (×3): 50 ug via INTRAVENOUS
  Administered 2019-05-22: 150 ug via INTRAVENOUS
  Administered 2019-05-22: 50 ug via INTRAVENOUS

## 2019-05-22 MED ORDER — DIPHENHYDRAMINE HCL 12.5 MG/5ML PO ELIX: 12.5000 mg | ORAL_SOLUTION | Freq: Two times a day (BID) | ORAL | Status: DC | PRN

## 2019-05-22 MED ORDER — SUCCINYLCHOLINE CHLORIDE 200 MG/10ML IV SOSY
PREFILLED_SYRINGE | INTRAVENOUS | Status: AC
  Filled 2019-05-22: qty 10

## 2019-05-22 MED ORDER — OXYCODONE HCL 5 MG PO TABS
5.0000 mg | ORAL_TABLET | ORAL | Status: DC | PRN
  Administered 2019-05-23 – 2019-05-25 (×13): 10 mg via ORAL
  Filled 2019-05-22 (×13): qty 2

## 2019-05-22 MED ORDER — ONDANSETRON HCL 4 MG/2ML IJ SOLN: 4.0000 mg | Freq: Once | INTRAMUSCULAR | Status: DC | PRN

## 2019-05-22 MED ORDER — LIDOCAINE 2% (20 MG/ML) 5 ML SYRINGE
INTRAMUSCULAR | Status: AC
  Filled 2019-05-22: qty 5

## 2019-05-22 MED ORDER — SUGAMMADEX SODIUM 500 MG/5ML IV SOLN
INTRAVENOUS | Status: AC
  Filled 2019-05-22: qty 5

## 2019-05-22 MED ORDER — HYDROMORPHONE HCL 1 MG/ML IJ SOLN
0.2500 mg | INTRAMUSCULAR | Status: DC | PRN
  Administered 2019-05-22 (×4): 0.5 mg via INTRAVENOUS

## 2019-05-22 MED ORDER — CHLORHEXIDINE GLUCONATE CLOTH 2 % EX PADS
6.0000 | MEDICATED_PAD | Freq: Every day | CUTANEOUS | Status: DC
  Administered 2019-05-23 – 2019-05-25 (×3): 6 via TOPICAL

## 2019-05-22 MED ORDER — PHENYLEPHRINE HCL-NACL 10-0.9 MG/250ML-% IV SOLN
INTRAVENOUS | Status: DC | PRN
  Administered 2019-05-22: 25 ug/min via INTRAVENOUS

## 2019-05-22 MED ORDER — FENTANYL CITRATE (PF) 250 MCG/5ML IJ SOLN
INTRAMUSCULAR | Status: AC
  Filled 2019-05-22: qty 5

## 2019-05-22 MED ORDER — PROPOFOL 10 MG/ML IV BOLUS
INTRAVENOUS | Status: DC | PRN
  Administered 2019-05-22: 150 mg via INTRAVENOUS

## 2019-05-22 MED ORDER — SODIUM CHLORIDE (PF) 0.9 % IJ SOLN
INTRAMUSCULAR | Status: AC
  Filled 2019-05-22: qty 20

## 2019-05-22 MED ORDER — OXYCODONE HCL 5 MG/5ML PO SOLN: 5.0000 mg | Freq: Once | ORAL | Status: DC | PRN

## 2019-05-22 MED ORDER — FLECAINIDE ACETATE 100 MG PO TABS
100.0000 mg | ORAL_TABLET | Freq: Two times a day (BID) | ORAL | Status: DC
  Administered 2019-05-22 – 2019-05-25 (×6): 100 mg via ORAL
  Filled 2019-05-22 (×7): qty 1

## 2019-05-22 MED ORDER — LACTATED RINGERS IR SOLN
Status: DC | PRN
  Administered 2019-05-22: 1000 mL

## 2019-05-22 MED ORDER — DEXTROSE-NACL 5-0.45 % IV SOLN
INTRAVENOUS | Status: DC
  Administered 2019-05-22 – 2019-05-24 (×2): via INTRAVENOUS

## 2019-05-22 MED ORDER — DOCUSATE SODIUM 100 MG PO CAPS
100.0000 mg | ORAL_CAPSULE | Freq: Two times a day (BID) | ORAL | Status: DC
  Administered 2019-05-22 – 2019-05-25 (×6): 100 mg via ORAL
  Filled 2019-05-22 (×6): qty 1

## 2019-05-22 MED ORDER — MIDAZOLAM HCL 2 MG/2ML IJ SOLN
INTRAMUSCULAR | Status: AC
  Filled 2019-05-22: qty 2

## 2019-05-22 MED ORDER — ONDANSETRON HCL 4 MG/2ML IJ SOLN
INTRAMUSCULAR | Status: DC | PRN
  Administered 2019-05-22 (×2): 4 mg via INTRAVENOUS

## 2019-05-22 MED ORDER — FENTANYL CITRATE (PF) 100 MCG/2ML IJ SOLN
INTRAMUSCULAR | Status: AC
  Filled 2019-05-22: qty 4

## 2019-05-22 MED ORDER — SUCCINYLCHOLINE CHLORIDE 200 MG/10ML IV SOSY
PREFILLED_SYRINGE | INTRAVENOUS | Status: DC | PRN
  Administered 2019-05-22: 100 mg via INTRAVENOUS

## 2019-05-22 MED ORDER — VERAPAMIL HCL ER 120 MG PO TBCR
120.0000 mg | EXTENDED_RELEASE_TABLET | Freq: Every day | ORAL | Status: DC
  Filled 2019-05-22 (×4): qty 1

## 2019-05-22 MED ORDER — DEXAMETHASONE SODIUM PHOSPHATE 10 MG/ML IJ SOLN
INTRAMUSCULAR | Status: AC
  Filled 2019-05-22: qty 1

## 2019-05-22 MED ORDER — ROCURONIUM BROMIDE 10 MG/ML (PF) SYRINGE
PREFILLED_SYRINGE | INTRAVENOUS | Status: DC | PRN
  Administered 2019-05-22: 10 mg via INTRAVENOUS
  Administered 2019-05-22: 20 mg via INTRAVENOUS
  Administered 2019-05-22: 10 mg via INTRAVENOUS
  Administered 2019-05-22: 50 mg via INTRAVENOUS

## 2019-05-22 MED ORDER — FENTANYL CITRATE (PF) 100 MCG/2ML IJ SOLN
25.0000 ug | INTRAMUSCULAR | Status: DC | PRN
  Administered 2019-05-22 (×4): 50 ug via INTRAVENOUS

## 2019-05-22 MED ORDER — LIDOCAINE 2% (20 MG/ML) 5 ML SYRINGE
INTRAMUSCULAR | Status: DC | PRN
  Administered 2019-05-22: 80 mg via INTRAVENOUS

## 2019-05-22 MED ORDER — SODIUM CHLORIDE (PF) 0.9 % IJ SOLN
INTRAMUSCULAR | Status: DC | PRN
  Administered 2019-05-22: 10 mL

## 2019-05-22 MED ORDER — OXYCODONE HCL 5 MG PO TABS: 5.0000 mg | ORAL_TABLET | Freq: Once | ORAL | Status: DC | PRN

## 2019-05-22 MED ORDER — ACETAMINOPHEN 325 MG PO TABS
650.0000 mg | ORAL_TABLET | ORAL | Status: DC
  Administered 2019-05-22 – 2019-05-23 (×4): 650 mg via ORAL
  Filled 2019-05-22 (×4): qty 2

## 2019-05-22 MED ORDER — SUGAMMADEX SODIUM 200 MG/2ML IV SOLN
INTRAVENOUS | Status: DC | PRN
  Administered 2019-05-22: 450 mg via INTRAVENOUS

## 2019-05-22 MED ORDER — SODIUM CHLORIDE 0.9 % IR SOLN
3000.0000 mL | Status: DC
  Administered 2019-05-22 – 2019-05-24 (×22): 3000 mL

## 2019-05-22 MED ORDER — FENTANYL CITRATE (PF) 100 MCG/2ML IJ SOLN
INTRAMUSCULAR | Status: AC
  Filled 2019-05-22: qty 2

## 2019-05-22 MED ORDER — MORPHINE SULFATE (PF) 2 MG/ML IV SOLN
2.0000 mg | INTRAVENOUS | Status: DC | PRN
  Administered 2019-05-22 – 2019-05-23 (×3): 2 mg via INTRAVENOUS
  Administered 2019-05-23 (×2): 4 mg via INTRAVENOUS
  Administered 2019-05-24 (×2): 2 mg via INTRAVENOUS
  Filled 2019-05-22 (×4): qty 1
  Filled 2019-05-22: qty 2
  Filled 2019-05-22: qty 1
  Filled 2019-05-22 (×2): qty 2

## 2019-05-22 MED ORDER — BUPIVACAINE LIPOSOME 1.3 % IJ SUSP
20.0000 mL | Freq: Once | INTRAMUSCULAR | Status: AC
  Administered 2019-05-22: 20 mL
  Filled 2019-05-22: qty 20

## 2019-05-22 MED ORDER — CEFAZOLIN SODIUM-DEXTROSE 2-4 GM/100ML-% IV SOLN
2.0000 g | INTRAVENOUS | Status: AC
  Administered 2019-05-22: 2 g via INTRAVENOUS
  Filled 2019-05-22: qty 100

## 2019-05-22 MED ORDER — DEXAMETHASONE SODIUM PHOSPHATE 10 MG/ML IJ SOLN
INTRAMUSCULAR | Status: DC | PRN
  Administered 2019-05-22: 10 mg via INTRAVENOUS

## 2019-05-22 MED ORDER — HYDROMORPHONE HCL 1 MG/ML IJ SOLN
INTRAMUSCULAR | Status: AC
  Filled 2019-05-22: qty 2

## 2019-05-22 MED ORDER — MIDAZOLAM HCL 5 MG/5ML IJ SOLN
INTRAMUSCULAR | Status: DC | PRN
  Administered 2019-05-22: 2 mg via INTRAVENOUS

## 2019-05-22 MED ORDER — ROCURONIUM BROMIDE 10 MG/ML (PF) SYRINGE
PREFILLED_SYRINGE | INTRAVENOUS | Status: AC
  Filled 2019-05-22: qty 10

## 2019-05-22 MED ORDER — DIPHENHYDRAMINE HCL 50 MG/ML IJ SOLN
12.5000 mg | Freq: Two times a day (BID) | INTRAMUSCULAR | Status: DC | PRN
  Administered 2019-05-25: 12.5 mg via INTRAVENOUS
  Filled 2019-05-22: qty 1

## 2019-05-22 MED ORDER — LACTATED RINGERS IV SOLN
INTRAVENOUS | Status: DC
  Administered 2019-05-22 (×2): via INTRAVENOUS

## 2019-05-22 SURGICAL SUPPLY — 72 items
ADH SKN CLS APL DERMABOND .7 (GAUZE/BANDAGES/DRESSINGS) ×1
APL PRP STRL LF DISP 70% ISPRP (MISCELLANEOUS) ×1
APL SWBSTK 6 STRL LF DISP (MISCELLANEOUS) ×1
APPLICATOR COTTON TIP 6 STRL (MISCELLANEOUS) ×1 IMPLANT
APPLICATOR COTTON TIP 6IN STRL (MISCELLANEOUS) ×3
CATH FOLEY 2WAY SLVR 18FR 30CC (CATHETERS) ×3 IMPLANT
CATH FOLEY 3WAY 30CC 22FR (CATHETERS) ×2 IMPLANT
CATH TIEMANN FOLEY 18FR 5CC (CATHETERS) IMPLANT
CHLORAPREP W/TINT 26 (MISCELLANEOUS) ×3 IMPLANT
CLIP VESOLOCK LG 6/CT PURPLE (CLIP) ×4 IMPLANT
CLOTH BEACON ORANGE TIMEOUT ST (SAFETY) ×3 IMPLANT
COVER SURGICAL LIGHT HANDLE (MISCELLANEOUS) ×3 IMPLANT
COVER TIP SHEARS 8 DVNC (MISCELLANEOUS) ×1 IMPLANT
COVER TIP SHEARS 8MM DA VINCI (MISCELLANEOUS) ×2
COVER WAND RF STERILE (DRAPES) IMPLANT
CUTTER ECHEON FLEX ENDO 45 340 (ENDOMECHANICALS) ×2 IMPLANT
DECANTER SPIKE VIAL GLASS SM (MISCELLANEOUS) ×3 IMPLANT
DERMABOND ADVANCED (GAUZE/BANDAGES/DRESSINGS) ×2
DERMABOND ADVANCED .7 DNX12 (GAUZE/BANDAGES/DRESSINGS) ×1 IMPLANT
DRAPE ARM DVNC X/XI (DISPOSABLE) ×4 IMPLANT
DRAPE COLUMN DVNC XI (DISPOSABLE) ×1 IMPLANT
DRAPE DA VINCI XI ARM (DISPOSABLE) ×8
DRAPE DA VINCI XI COLUMN (DISPOSABLE) ×2
DRAPE SURG IRRIG POUCH 19X23 (DRAPES) ×3 IMPLANT
DRSG TEGADERM 4X4.75 (GAUZE/BANDAGES/DRESSINGS) ×4 IMPLANT
ELECT PENCIL ROCKER SW 15FT (MISCELLANEOUS) ×3 IMPLANT
ELECT REM PT RETURN 15FT ADLT (MISCELLANEOUS) ×3 IMPLANT
GAUZE 4X4 16PLY RFD (DISPOSABLE) ×2 IMPLANT
GAUZE SPONGE 2X2 8PLY STRL LF (GAUZE/BANDAGES/DRESSINGS) ×1 IMPLANT
GLOVE BIO SURGEON STRL SZ 6.5 (GLOVE) ×2 IMPLANT
GLOVE BIO SURGEONS STRL SZ 6.5 (GLOVE) ×1
GLOVE BIOGEL M STRL SZ7.5 (GLOVE) ×6 IMPLANT
GLOVE BIOGEL PI IND STRL 7.5 (GLOVE) ×1 IMPLANT
GLOVE BIOGEL PI INDICATOR 7.5 (GLOVE) ×2
GOWN STRL REUS W/TWL LRG LVL3 (GOWN DISPOSABLE) ×9 IMPLANT
HOLDER FOLEY CATH W/STRAP (MISCELLANEOUS) ×3 IMPLANT
IRRIG SUCT STRYKERFLOW 2 WTIP (MISCELLANEOUS) ×3
IRRIGATION SUCT STRKRFLW 2 WTP (MISCELLANEOUS) ×1 IMPLANT
KIT TURNOVER KIT A (KITS) IMPLANT
NDL INSUFFLATION 14GA 120MM (NEEDLE) ×1 IMPLANT
NEEDLE INSUFFLATION 14GA 120MM (NEEDLE) ×3 IMPLANT
PACK ROBOT UROLOGY CUSTOM (CUSTOM PROCEDURE TRAY) ×3 IMPLANT
PAD POSITIONING PINK XL (MISCELLANEOUS) ×3 IMPLANT
PORT ACCESS TROCAR AIRSEAL 12 (TROCAR) IMPLANT
PORT ACCESS TROCAR AIRSEAL 5M (TROCAR) ×2
RELOAD STAPLE 45 4.1 GRN THCK (STAPLE) IMPLANT
SEAL CANN UNIV 5-8 DVNC XI (MISCELLANEOUS) ×4 IMPLANT
SEAL XI 5MM-8MM UNIVERSAL (MISCELLANEOUS) ×8
SET TRI-LUMEN FLTR TB AIRSEAL (TUBING) ×2 IMPLANT
SOLUTION ELECTROLUBE (MISCELLANEOUS) ×3 IMPLANT
SPONGE GAUZE 2X2 STER 10/PKG (GAUZE/BANDAGES/DRESSINGS)
SPONGE LAP 4X18 RFD (DISPOSABLE) ×3 IMPLANT
STAPLE RELOAD 45 GRN (STAPLE) ×1 IMPLANT
STAPLE RELOAD 45MM GREEN (STAPLE) ×3
SUT ETHILON 3 0 PS 1 (SUTURE) ×3 IMPLANT
SUT MNCRL AB 4-0 PS2 18 (SUTURE) ×6 IMPLANT
SUT PDS AB 1 CT1 27 (SUTURE) ×6 IMPLANT
SUT V-LOC BARB 180 2/0GR6 GS22 (SUTURE) ×6
SUT VIC AB 0 CT1 27 (SUTURE) ×9
SUT VIC AB 0 CT1 27XBRD ANTBC (SUTURE) ×3 IMPLANT
SUT VIC AB 2-0 SH 27 (SUTURE) ×3
SUT VIC AB 2-0 SH 27X BRD (SUTURE) ×1 IMPLANT
SUT VICRYL 0 UR6 27IN ABS (SUTURE) ×3 IMPLANT
SUT VLOC BARB 180 ABS3/0GR12 (SUTURE) ×9
SUTURE V-LC BRB 180 2/0GR6GS22 (SUTURE) ×2 IMPLANT
SUTURE VLOC BRB 180 ABS3/0GR12 (SUTURE) ×2 IMPLANT
SYRINGE IRR TOOMEY STRL 70CC (SYRINGE) ×3 IMPLANT
TOWEL OR 17X26 10 PK STRL BLUE (TOWEL DISPOSABLE) ×1 IMPLANT
TOWEL OR NON WOVEN STRL DISP B (DISPOSABLE) ×3 IMPLANT
TROCAR BLADELESS OPT 5 100 (ENDOMECHANICALS) IMPLANT
TROCAR XCEL NON-BLD 5MMX100MML (ENDOMECHANICALS) IMPLANT
WATER STERILE IRR 1000ML POUR (IV SOLUTION) ×3 IMPLANT

## 2019-05-22 NOTE — Transfer of Care (Signed)
Immediate Anesthesia Transfer of Care Note  Patient: [name]  Procedure(s) Performed: XI ROBOTIC ASSISTED SIMPLE PROSTATECTOMY (N/A )  Patient Location: PACU  Anesthesia Type:General  Level of Consciousness: awake, alert , oriented and patient cooperative  Airway & Oxygen Therapy: Patient Spontanous Breathing and Patient connected to face mask oxygen  Post-op Assessment: Report given to RN, Post -op Vital signs reviewed and stable and Patient moving all extremities  Post vital signs: Reviewed and stable  Last Vitals:  Vitals Value Taken Time  BP 198/122 05/22/19 1615  Temp    Pulse 94 05/22/19 1616  Resp 17 05/22/19 1616  SpO2 99 % 05/22/19 1616  Vitals shown include unvalidated device data.  Last Pain:  Vitals:   05/22/19 1029  TempSrc:   PainSc: 2       Patients Stated Pain Goal: 1 ([id number])  Complications: No apparent anesthesia complications

## 2019-05-22 NOTE — Anesthesia Procedure Notes (Signed)
Procedure Name: Intubation Date/Time: 05/22/2019 12:31 PM Performed by: Mitzie Na, CRNA Pre-anesthesia Checklist: Patient identified, Emergency Drugs available, Suction available and Patient being monitored Patient Re-evaluated:Patient Re-evaluated prior to induction Oxygen Delivery Method: Circle system utilized Preoxygenation: Pre-oxygenation with 100% oxygen Induction Type: IV induction and Rapid sequence Laryngoscope Size: Mac and 3 Grade View: Grade I Tube type: Oral Tube size: 7.5 mm Number of attempts: 1 Airway Equipment and Method: Stylet and Oral airway Placement Confirmation: ETT inserted through vocal cords under direct vision,  positive ETCO2 and breath sounds checked- equal and bilateral Secured at: 23 cm Tube secured with: Tape Dental Injury: Teeth and Oropharynx as per pre-operative assessment

## 2019-05-22 NOTE — Brief Op Note (Signed)
05/22/2019  3:47 PM  PATIENT:  [name]  73 y.o. male  PRE-OPERATIVE DIAGNOSIS:  MASSIVE PROSTATE , HEMATURIA AND URINARY RETENTION  POST-OPERATIVE DIAGNOSIS:  MASSIVE PROSTATE , HEMATURIA AND URINARY RETENTION  PROCEDURE:  Procedure(s) with comments: XI ROBOTIC ASSISTED SIMPLE PROSTATECTOMY (N/A) - 3 HRS  SURGEON:  Surgeon(s) and Role:    Alexis Frock, MD - Primary  PHYSICIAN ASSISTANT:   ASSISTANTS: Sharlot Gowda MD   ANESTHESIA:   general  EBL:  200 mL   BLOOD ADMINISTERED:none  DRAINS: 1 - JP to bulb; 2 - 3 Way foley to NS irrigation   LOCAL MEDICATIONS USED:  MARCAINE     SPECIMEN:  Source of Specimen:  1 - prostate adenoma  DISPOSITION OF SPECIMEN:  PATHOLOGY  COUNTS:  YES  TOURNIQUET:  * No tourniquets in log *  DICTATION: .Other Dictation: Dictation Number [id number]  PLAN OF CARE: Admit to inpatient   PATIENT DISPOSITION:  PACU - hemodynamically stable.   Delay start of Pharmacological VTE agent (>24hrs) due to surgical blood loss or risk of bleeding: yes

## 2019-05-22 NOTE — Anesthesia Preprocedure Evaluation (Signed)
Anesthesia Evaluation  Patient identified by MRN, date of birth, ID band Patient awake    Reviewed: Allergy & Precautions, NPO status , Patient's Chart, lab work & pertinent test results  History of Anesthesia Complications Negative for: history of anesthetic complications  Airway Mallampati: II  TM Distance: >3 FB Neck ROM: Full    Dental  (+) Teeth Intact   Pulmonary neg pulmonary ROS,    Pulmonary exam normal        Cardiovascular hypertension, Pt. on medications Normal cardiovascular exam+ dysrhythmias (LINQ monitor in place) Atrial Fibrillation      Neuro/Psych PSYCHIATRIC DISORDERS Anxiety negative neurological ROS     GI/Hepatic Neg liver ROS, GERD  ,  Endo/Other  negative endocrine ROS  Renal/GU negative Renal ROS   Enlarged prostate, urinary retention    Musculoskeletal negative musculoskeletal ROS (+)   Abdominal   Peds  Hematology negative hematology ROS (+)   Anesthesia Other Findings EKG: 12/24/2018 69 bpm Normal sinus rhythm   CV: Myocardial Perfusion 12/14/2016  Nuclear stress EF: 54%.  There was no ST segment deviation noted during stress.  This is a low risk study.  The left ventricular ejection fraction is mildly decreased (45-54%).  1. EF 54%, normal wall motion.  2. Fixed medium-sized, mild basal to mid inferolateral and inferior perfusion defect. Given normal inferior wall motion, think most likely diaphragmatic attenuation. No ischemia.  Low risk study.   Echo 08/17/16: LVEF 55-60%, normal wall thickness, normal wall motion, diastolic dysfunction, normal LV filling pressure, aortic valve sclerosis, trivial MR, normal LA size, normal IVC.  Reproductive/Obstetrics                             Anesthesia Physical Anesthesia Plan  ASA: III  Anesthesia Plan: General   Post-op Pain Management:    Induction: Intravenous  PONV Risk Score and Plan:  3 and Ondansetron, Dexamethasone, Treatment may vary due to age or medical condition and Midazolam  Airway Management Planned: Oral ETT  Additional Equipment: None  Intra-op Plan:   Post-operative Plan: Extubation in OR  Informed Consent: I have reviewed the patients History and Physical, chart, labs and discussed the procedure including the risks, benefits and alternatives for the proposed anesthesia with the patient or authorized representative who has indicated his/her understanding and acceptance.     Dental advisory given  Plan Discussed with:   Anesthesia Plan Comments:         Anesthesia Quick Evaluation

## 2019-05-22 NOTE — Anesthesia Postprocedure Evaluation (Signed)
Anesthesia Post Note  Patient: [name]  Procedure(s) Performed: XI ROBOTIC ASSISTED SIMPLE PROSTATECTOMY (N/A )     Patient location during evaluation: PACU Anesthesia Type: General Level of consciousness: awake and alert Pain management: pain level controlled Vital Signs Assessment: post-procedure vital signs reviewed and stable Respiratory status: spontaneous breathing, nonlabored ventilation and respiratory function stable Cardiovascular status: blood pressure returned to baseline and stable Postop Assessment: no apparent nausea or vomiting Anesthetic complications: no    Last Vitals:  Vitals:   05/22/19 1745 05/22/19 1815  BP: 123/70 130/64  Pulse: 74 76  Resp: 13 18  Temp: 36.9 C 36.5 C  SpO2: 99% 94%    Last Pain:  Vitals:   05/22/19 1815  TempSrc: Oral  PainSc:                  Audry Pili

## 2019-05-22 NOTE — H&P (Signed)
[name] is an 73 y.o. male.    Chief Complaint: Pre-OP Simple Prostatectomy  HPI:   1 - Massive Prostate With Urinary Retention - Long h/o obstructive symptoms and recurrent retention. Now on rapaflo + finasteride with PVR's <43mL x several. TRUS 2017 171mL with mostly bilobar hypertrophy.   Recurrent retention 2020 and foley placed. CT 04/2019 with 220gm gland with large median lobe.   2 - Recurrent Gross Hematuria - recurrent visible blood in urine. Had dedicated eval 2015 with CT and cysto with only massive prostate as likely source. Non- smoker. No chronic solvent exposure. Reduction in hematuria on finasteride until started on xarelto 2018 for paroxosymal AFib and had transient worsening that has resolved again.   3- Prostate Screening - PSA 1.87 at age 64 on finasteride with 169mL prostate ===> No further screening.    PMH sig for AFib/Xarelto (no PE,CVA, follows [facility], wants to get off anticoagulation), ortho surgeries, Left inguinal and umbilcal hernia repairs with mesh. His PCP is Johnathan Pinto MD.   Today "[name]" is seen to proceed with simple prostatectomy. Foley in place with occasional spasms. He has been on cipro pre-op as RX'd to reduce known colonization. C19 negative. NO interval fevers.      Past Medical History:  Diagnosis Date  . A-fib ([place])   . AICD (automatic cardioverter/defibrillator) present   . Arthritis   . Benign prostatic hyperplasia   . BPH (benign prostatic hypertrophy)   . Carpal tunnel syndrome    bilateral  . Carpal tunnel syndrome, bilateral   . Early cataract   . History of SCC (squamous cell carcinoma) of skin 10/04/2016  . HNP (herniated nucleus pulposus), cervical   . Hyperlipidemia   . Hypertension   . IBS (irritable bowel syndrome)   . Mild acid reflux occasional  . Obesity   . Other abnormal glucose   . Pelvic pain in male   . Pneumonia    " walking"  . PONV (postoperative nausea and vomiting)   . Pre-diabetes   .  Vitamin D deficiency   . Wears glasses     Past Surgical History:  Procedure Laterality Date  . ANTERIOR CERVICAL DECOMP/DISCECTOMY FUSION  01-25-2002   C4 - C5  . ANTERIOR CERVICAL DECOMP/DISCECTOMY FUSION Bilateral 07/16/2018   Procedure: REMOVAL OF ANTERIOR CERVICAL PLATE, ANTERIOR CERVICAL DECOMPRESSION/DISCECTOMY FUSION CERVICAL THREE- CERVICAL FOUR;  Surgeon: Jovita Gamma, MD;  Location: [facility];  Service: Neurosurgery;  Laterality: Bilateral;  REMOVAL OF ANTERIOR CERVICAL PLATE, ANTERIOR CERVICAL DECOMPRESSION/DISCECTOMY FUSION CERVICAL THREE- CERVICAL FOUR  . ANTERIOR CRUCIATE LIGAMENT REPAIR  1990   RIGHT  . APPENDECTOMY  02-07-2007  . CARPAL TUNNEL RELEASE Right 07/16/2018   Procedure: CARPAL TUNNEL RELEASE;  Surgeon: Jovita Gamma, MD;  Location: [facility];  Service: Neurosurgery;  Laterality: Right;  CARPAL TUNNEL RELEASE  . CERVICAL FUSION  2000   C5 - 6  . COLONOSCOPY  05-30-2011   HEMORRHOIDS  . HERNIA REPAIR    . HIP SURGERY  1999   LEFT HIP --  REMOVAL CALCIUM BUILD-UP  . LEFT ANKLE SURG.  1980  . LEFT INGUINAL HERNIA REPAIR  1970  . LEFT ROTATOR CUFF REPAIR  2002  . LOOP RECORDER INSERTION N/A 09/19/2016   Procedure: Loop Recorder Insertion;  Surgeon: Thompson Grayer, MD;  Location: [facility];  Service: Cardiovascular;  Laterality: N/A;  . TENDON RECONSTRUCTION Left 07/17/2013   Procedure: LEFT ELBOW LATERAL RECONSTRUCTION;  Surgeon: Schuyler Amor, MD;  Location: [facility];  Service: Orthopedics;  Laterality: Left;  . TONSILLECTOMY    . TRIGGER FINGER RELEASE Right 07/17/2013   Procedure: RELEASE A-1 PULLEY RIGHT RING FINGER;  Surgeon: Schuyler Amor, MD;  Location: [facility];  Service: Orthopedics;  Laterality: Right;  . UMBILICAL HERNIA REPAIR  2003    Family History  Problem Relation Age of Onset  . Heart disease Mother   . Stroke Mother 21  . Heart disease Father   . Diabetes Father   . Heart attack Father 59  .  Alcohol abuse Father    Social History:  reports that he has never smoked. He has never used smokeless tobacco. He reports current alcohol use of about 4.0 standard drinks of alcohol per week. He reports that he does not use drugs.  Allergies:  Allergies  Allergen Reactions  . Xarelto [Rivaroxaban] Other (See Comments)    Severe body aches  . Atorvastatin Other (See Comments)    Myalgias  . Clonazepam Other (See Comments)    "Made me jittery"  . Tamsulosin Hcl Other (See Comments)    Hypotension    No medications prior to admission.    Results for orders placed or performed during the hospital encounter of 05/20/19 (from the past 48 hour(s))  Hemoglobin A1c     Status: None   Collection Time: 05/20/19 11:27 AM  Result Value Ref Range   Hgb A1c MFr Bld 5.1 4.8 - 5.6 %    Comment: (NOTE) Pre diabetes:          5.7%-6.4% Diabetes:              >6.4% Glycemic control for   <7.0% adults with diabetes    Mean Plasma Glucose 99.67 mg/dL    Comment: Performed at [facility] [address], [place], [place] [id number]  Basic metabolic panel     Status: Abnormal   Collection Time: 05/20/19 11:27 AM  Result Value Ref Range   Sodium 138 135 - 145 mmol/L   Potassium 4.2 3.5 - 5.1 mmol/L   Chloride 106 98 - 111 mmol/L   CO2 22 22 - 32 mmol/L   Glucose, Bld 105 (H) 70 - 99 mg/dL   BUN 15 8 - 23 mg/dL   Creatinine, Ser 0.94 0.61 - 1.24 mg/dL   Calcium 9.3 8.9 - 10.3 mg/dL   GFR calc non Af Amer >60 >60 mL/min   GFR calc Af Amer >60 >60 mL/min   Anion gap 10 5 - 15    Comment: Performed at [facility], [place] [address], [place], [place] [address]  CBC     Status: Abnormal   Collection Time: 05/20/19 11:27 AM  Result Value Ref Range   WBC 12.6 (H) 4.0 - 10.5 K/uL   RBC 5.03 4.22 - 5.81 MIL/uL   Hemoglobin 15.6 13.0 - 17.0 g/dL   HCT 49.0 39.0 - 52.0 %   MCV 97.4 80.0 - 100.0 fL   MCH 31.0 26.0 - 34.0 pg   MCHC 31.8 30.0 - 36.0 g/dL   RDW 13.2 11.5 -  15.5 %   Platelets 239 150 - 400 K/uL   nRBC 0.0 0.0 - 0.2 %    Comment: Performed at [facility], [place] [address], [place], [place] [address]   No results found.  Review of Systems  Constitutional: Negative for chills and fever.  HENT: Negative.   Eyes: Negative.   Respiratory: Negative.   Cardiovascular:  Negative.   Gastrointestinal: Negative.   Genitourinary: Positive for urgency.  Skin: Negative.   Neurological: Negative.   Endo/Heme/Allergies: Negative.   Psychiatric/Behavioral: Negative.     There were no vitals taken for this visit. Physical Exam  Constitutional: He appears well-developed.  HENT:  Head: Normocephalic.  Eyes: Pupils are equal, round, and reactive to light.  Neck: Normal range of motion.  Cardiovascular: Normal rate.  Respiratory: Effort normal.  GI: Soft.  Genitourinary:    Genitourinary Comments: Foley in place with medium yellow uirne that is non-foul.    Neurological: He is alert.  Skin: Skin is warm.  Psychiatric: He has a normal mood and affect.     Assessment/Plan  Proceed as planned with simple prostatectomy. Risks, benefits, alternatives, expected peri-op course discussed previously and reiterated today.   Alexis Frock, MD 05/22/2019, 8:14 AM

## 2019-05-23 ENCOUNTER — Encounter (HOSPITAL_COMMUNITY): Payer: Self-pay | Admitting: Urology

## 2019-05-23 LAB — BASIC METABOLIC PANEL
Anion gap: 8 (ref 5–15)
BUN: 19 mg/dL (ref 8–23)
CO2: 22 mmol/L (ref 22–32)
Calcium: 8.1 mg/dL — ABNORMAL LOW (ref 8.9–10.3)
Chloride: 98 mmol/L (ref 98–111)
Creatinine, Ser: 1.32 mg/dL — ABNORMAL HIGH (ref 0.61–1.24)
GFR calc Af Amer: >60 mL/min (ref >60)
GFR calc non Af Amer: 53 mL/min — ABNORMAL LOW (ref >60)
Glucose, Bld: 134 mg/dL — ABNORMAL HIGH (ref 70–99)
Potassium: 4.7 mmol/L (ref 3.5–5.1)
Sodium: 128 mmol/L — ABNORMAL LOW (ref 135–145)

## 2019-05-23 LAB — HEMOGLOBIN AND HEMATOCRIT, BLOOD
HCT: 35.4 % — ABNORMAL LOW (ref 39.0–52.0)
Hemoglobin: 11.3 g/dL — ABNORMAL LOW (ref 13.0–17.0)

## 2019-05-23 MED ORDER — SIMETHICONE 40 MG/0.6ML PO SUSP
40.0000 mg | Freq: Two times a day (BID) | ORAL | Status: DC | PRN
  Administered 2019-05-23 – 2019-05-24 (×3): 40 mg via ORAL
  Filled 2019-05-23 (×5): qty 0.6

## 2019-05-23 MED ORDER — SODIUM CHLORIDE 0.9 % IV BOLUS
500.0000 mL | Freq: Once | INTRAVENOUS | Status: AC
  Administered 2019-05-23: 500 mL via INTRAVENOUS

## 2019-05-23 MED ORDER — LIDOCAINE 5 % EX PTCH
1.0000 | MEDICATED_PATCH | CUTANEOUS | Status: DC
  Filled 2019-05-23 (×3): qty 1

## 2019-05-23 MED ORDER — ALPRAZOLAM 0.5 MG PO TABS
0.5000 mg | ORAL_TABLET | Freq: Every day | ORAL | Status: DC
  Administered 2019-05-23 – 2019-05-24 (×2): 0.5 mg via ORAL
  Filled 2019-05-23 (×2): qty 1

## 2019-05-23 MED ORDER — MIRABEGRON ER 25 MG PO TB24
25.0000 mg | ORAL_TABLET | Freq: Every day | ORAL | Status: DC
  Administered 2019-05-23 – 2019-05-25 (×3): 25 mg via ORAL
  Filled 2019-05-23 (×3): qty 1

## 2019-05-23 MED ORDER — ACETAMINOPHEN 500 MG PO TABS
1000.0000 mg | ORAL_TABLET | Freq: Three times a day (TID) | ORAL | Status: DC
  Administered 2019-05-24 – 2019-05-25 (×4): 1000 mg via ORAL
  Filled 2019-05-23 (×4): qty 2

## 2019-05-23 MED ORDER — BELLADONNA ALKALOIDS-OPIUM 16.2-60 MG RE SUPP
1.0000 | Freq: Once | RECTAL | Status: AC
  Administered 2019-05-23: 1 via RECTAL
  Filled 2019-05-23: qty 1

## 2019-05-23 NOTE — Progress Notes (Signed)
Urology Progress Note   1 Day Post-Op from robotic assisted simple prostatectomy.   Subjective: Required hand bladder irrigation postop.  Continued on CBI, urine is clear this morning without clot. UOP not quantified. He reports it "was a long night"   Afebrile. Intermittent hypotension without tachycardia. SORA.  Received 500cc bolus JP output since OR 90cc, expected.  Hgb 11.3 from 15.3 in PACU.  Cr 1.3 from 0.9; Na 128  Objective: Vital signs in last 24 hours: Temp:  [97.7 F (36.5 C)-98.4 F (36.9 C)] 98.4 F (36.9 C) (11/12 0521) Pulse Rate:  [55-95] 68 (11/12 1232) Resp:  [13-33] 14 (11/12 0521) BP: (79-198)/(46-122) 86/56 (11/12 1232) SpO2:  [94 %-100 %] 98 % (11/12 0521)  Intake/Output from previous day: 11/11 0701 - 11/12 0700 In: [id number] [P.O.:840; I.V.:2094.2; IV Piggyback:249.9] Out: [id number] [Urine:59200; Drains:90; Blood:200] Intake/Output this shift: Total I/O In: 3417.5 [P.O.:240; I.V.:177.5; Other:3000] Out: 3440 [Urine:3400; Drains:40]  Physical Exam:  General: Alert and oriented CV: Regular rate Lungs: No increased work of breathing Abdomen: Soft, appropriately tender (mostly LLQ). Incisions c/d/i. JP SS GU: Foley in place draining clear pale pink urine on moderate rate CBI; slowed to very slow drip and watermelon clear output returned.   Ext: NT, No erythema  Lab Results: Recent Labs    05/22/19 1624 05/23/19 0419  HGB 15.3 11.3*  HCT 48.6 35.4*   Recent Labs    05/23/19 0419  NA 128*  K 4.7  CL 98  CO2 22  GLUCOSE 134*  BUN 19  CREATININE 1.32*  CALCIUM 8.1*    Studies/Results: No results found.  Assessment/Plan:  73 y.o. male s/p robotic assisted simple prostatectomy.    POD0/1: Relative hypotension without tachycardia, no increase in JP output. Will keep on IVF and monitor. Required CBI overnight, anticipate wean off today. Continues to have bladder discomfort which is similar to preop, will attempt to optimize medical therapy  today  - Continue multimodal pain regimen  - Add Myrbetriq, Lidocaine patches, heat pack - Home flecainide and Verapamil  - Hold eliquis in the immediate postoperative setting - Continue mIVF at 50cc/hr given cardiac histroy - Regular diet, self regulate - Monitor JP output; reassuring low output while on CBI - Labs tomorrow to monitor Hgb, Cr, electrolytes -SCD/Tedhose/IS - Bowel regimen - Ween CBI to light pink without clot; anticipate today - Continue Foley, off traction at this time.  - Monitor strict in/outs - Must be OOB today, ideally walking.   Dispo: Pending    LOS: 1 day

## 2019-05-24 LAB — BASIC METABOLIC PANEL
Anion gap: 8 (ref 5–15)
BUN: 17 mg/dL (ref 8–23)
CO2: 25 mmol/L (ref 22–32)
Calcium: 8.6 mg/dL — ABNORMAL LOW (ref 8.9–10.3)
Chloride: 105 mmol/L (ref 98–111)
Creatinine, Ser: 0.86 mg/dL (ref 0.61–1.24)
GFR calc Af Amer: >60 mL/min (ref >60)
GFR calc non Af Amer: >60 mL/min (ref >60)
Glucose, Bld: 127 mg/dL — ABNORMAL HIGH (ref 70–99)
Potassium: 4.3 mmol/L (ref 3.5–5.1)
Sodium: 138 mmol/L (ref 135–145)

## 2019-05-24 LAB — HEMOGLOBIN AND HEMATOCRIT, BLOOD
HCT: 31.7 % — ABNORMAL LOW (ref 39.0–52.0)
Hemoglobin: 9.8 g/dL — ABNORMAL LOW (ref 13.0–17.0)

## 2019-05-24 MED ORDER — BELLADONNA ALKALOIDS-OPIUM 16.2-60 MG RE SUPP
1.0000 | Freq: Four times a day (QID) | RECTAL | Status: DC | PRN
  Administered 2019-05-24: 1 via RECTAL
  Filled 2019-05-24: qty 1

## 2019-05-24 NOTE — Progress Notes (Signed)
[facility] Summary Report / Loop Recorder 

## 2019-05-24 NOTE — Plan of Care (Signed)
  Problem: Education: Goal: Knowledge of General Education information will improve Description: Including pain rating scale, medication(s)/side effects and non-pharmacologic comfort measures Outcome: Completed/Met   Problem: Health Behavior/Discharge Planning: Goal: Ability to manage health-related needs will improve Outcome: Progressing   Problem: Clinical Measurements: Goal: Ability to maintain clinical measurements within normal limits will improve Outcome: Progressing Goal: Will remain free from infection Outcome: Progressing Goal: Diagnostic test results will improve Outcome: Progressing Goal: Respiratory complications will improve Outcome: Completed/Met Goal: Cardiovascular complication will be avoided Outcome: Progressing   Problem: Activity: Goal: Risk for activity intolerance will decrease Outcome: Progressing   Problem: Nutrition: Goal: Adequate nutrition will be maintained Outcome: Progressing   Problem: Coping: Goal: Level of anxiety will decrease Outcome: Progressing   Problem: Elimination: Goal: Will not experience complications related to bowel motility Outcome: Progressing Goal: Will not experience complications related to urinary retention Outcome: Progressing   Problem: Pain Managment: Goal: General experience of comfort will improve Outcome: Progressing   Problem: Safety: Goal: Ability to remain free from injury will improve Outcome: Progressing   Problem: Skin Integrity: Goal: Risk for impaired skin integrity will decrease Outcome: Progressing   Problem: Education: Goal: Knowledge of the procedure and recovery process will improve Outcome: Completed/Met   Problem: Bowel/Gastric: Goal: Gastrointestinal status for postoperative course will improve Outcome: Progressing   Problem: Pain Management: Goal: General experience of comfort will improve Outcome: Progressing   Problem: Skin Integrity: Goal: Demonstration of wound healing without  infection will improve Outcome: Progressing   Problem: Urinary Elimination: Goal: Ability to avoid or minimize complications of infection will improve Outcome: Progressing Goal: Ability to achieve and maintain urine output will improve Outcome: Progressing Goal: Home care management will improve Outcome: Progressing

## 2019-05-24 NOTE — Progress Notes (Signed)
Urology Progress Note   2 Days Post-Op from robotic assisted simple prostatectomy.   Subjective: Urine improving on CBI "Got 4hours of sleep last night which was great" Abdominal discomfort improved after flatus and ambulation.  Tolerating diet without nausea/emesis  Afebrile. BP improved. No tachycardia JP output since OR 100cc/24hr, expected.  Hgb 9.8 from 11.3 yesterday Cr 0.8 (baseline).   Objective: Vital signs in last 24 hours: Temp:  [98 F (36.7 C)-98.6 F (37 C)] 98.1 F (36.7 C) (11/13 1331) Pulse Rate:  [56-68] 61 (11/13 1542) Resp:  [14-18] 16 (11/13 1331) BP: (91-123)/(52-64) 123/62 (11/13 1542) SpO2:  [92 %-95 %] 95 % (11/13 1542)  Intake/Output from previous day: 11/12 0701 - 11/13 0700 In: 18143.3 [P.O.:480; I.V.:1063.3] Out: 21750 [Urine:21650; Drains:100] Intake/Output this shift: Total I/O In: 4530 [P.O.:360; I.V.:210; [id number] Out: 2655 [Urine:2625; Drains:30]  Physical Exam:  General: Alert and oriented CV: Regular rate Lungs: No increased work of breathing Abdomen: Soft, appropriately tender. Incisions c/d/i. JP SS GU: Foley in place draining clear watermelon colored urine, while OFF CBI. Hand irrigated gently without return of clot. Ext: NT, No erythema  Lab Results: Recent Labs    05/22/19 1624 05/23/19 0419 05/24/19 0537  HGB 15.3 11.3* 9.8*  HCT 48.6 35.4* 31.7*   Recent Labs    05/23/19 0419 05/24/19 0537  NA 128* 138  K 4.7 4.3  CL 98 105  CO2 22 25  GLUCOSE 134* 127*  BUN 19 17  CREATININE 1.32* 0.86  CALCIUM 8.1* 8.6*    Studies/Results: No results found.  Assessment/Plan:  73 y.o. male s/p robotic assisted simple prostatectomy.    POD0/1: Relative hypotension without tachycardia, no increase in JP output. Required 1 day of CBI, then weaned off on POD2. Abdominal pain improved on POD2. JP output remains low. Hgb monitored, anticipate will stabilize given low JP output and reassuring vitals.   - Continue  multimodal pain regimen  - Myrbetriq, Lidocaine patches, heat pack - Home flecainide and Verapamil  - Hold eliquis in the immediate postoperative setting - [place] - Regular diet, self regulate - Monitor JP output; reassuring low output while on CBI - Labs tomorrow to monitor Hgb, Cr, electrolytes - SCD/Tedhose/IS - Bowel regimen - OFF CBI as of 11/13 - Continue Foley, off traction  - Monitor strict in/outs - Must be OOB today, ideally walking.   Dispo: Pending, anticipate 11/14    LOS: 2 days

## 2019-05-25 LAB — CREATININE, FLUID (PLEURAL, PERITONEAL, JP DRAINAGE): Creat, Fluid: 0.9 mg/dL

## 2019-05-25 LAB — HEMOGLOBIN AND HEMATOCRIT, BLOOD
HCT: 29 % — ABNORMAL LOW (ref 39.0–52.0)
Hemoglobin: 9.1 g/dL — ABNORMAL LOW (ref 13.0–17.0)

## 2019-05-25 MED ORDER — DOCUSATE SODIUM 100 MG PO CAPS: 100.0000 mg | ORAL_CAPSULE | Freq: Two times a day (BID) | ORAL | 0 refills | Status: DC

## 2019-05-25 MED ORDER — OXYCODONE HCL 5 MG PO TABS: 5.0000 mg | ORAL_TABLET | ORAL | 0 refills | Status: DC | PRN

## 2019-05-25 NOTE — Plan of Care (Signed)
  Problem: Clinical Measurements: Goal: Ability to maintain clinical measurements within normal limits will improve Outcome: Completed/Met Goal: Will remain free from infection Outcome: Completed/Met Goal: Diagnostic test results will improve Outcome: Completed/Met Goal: Cardiovascular complication will be avoided Outcome: Completed/Met   Problem: Activity: Goal: Risk for activity intolerance will decrease Outcome: Completed/Met   Problem: Nutrition: Goal: Adequate nutrition will be maintained Outcome: Completed/Met   Problem: Coping: Goal: Level of anxiety will decrease Outcome: Completed/Met   Problem: Elimination: Goal: Will not experience complications related to bowel motility Outcome: Progressing Goal: Will not experience complications related to urinary retention Outcome: Adequate for Discharge   Problem: Pain Managment: Goal: General experience of comfort will improve Outcome: Adequate for Discharge   Problem: Safety: Goal: Ability to remain free from injury will improve Outcome: Completed/Met   Problem: Skin Integrity: Goal: Risk for impaired skin integrity will decrease Outcome: Completed/Met   Problem: Bowel/Gastric: Goal: Gastrointestinal status for postoperative course will improve Outcome: Progressing   Problem: Pain Management: Goal: General experience of comfort will improve Outcome: Adequate for Discharge   Problem: Skin Integrity: Goal: Demonstration of wound healing without infection will improve Outcome: Completed/Met   Problem: Urinary Elimination: Goal: Ability to avoid or minimize complications of infection will improve Outcome: Adequate for Discharge Goal: Ability to achieve and maintain urine output will improve Outcome: Completed/Met Goal: Home care management will improve Outcome: Progressing

## 2019-05-25 NOTE — Plan of Care (Signed)
  Problem: Health Behavior/Discharge Planning: Goal: Ability to manage health-related needs will improve Outcome: Completed/Met   Problem: Clinical Measurements: Goal: Ability to maintain clinical measurements within normal limits will improve Outcome: Completed/Met Goal: Will remain free from infection Outcome: Completed/Met Goal: Diagnostic test results will improve Outcome: Completed/Met Goal: Cardiovascular complication will be avoided Outcome: Completed/Met   Problem: Activity: Goal: Risk for activity intolerance will decrease Outcome: Completed/Met   Problem: Nutrition: Goal: Adequate nutrition will be maintained Outcome: Completed/Met   Problem: Elimination: Goal: Will not experience complications related to bowel motility 05/25/2019 1301 by Annie Sable, RN Outcome: Completed/Met 05/25/2019 0834 by Annie Sable, RN Outcome: Progressing Goal: Will not experience complications related to urinary retention 05/25/2019 1301 by Annie Sable, RN Outcome: Completed/Met 05/25/2019 0834 by Annie Sable, RN Outcome: Adequate for Discharge   Problem: Pain Managment: Goal: General experience of comfort will improve 05/25/2019 1301 by Annie Sable, RN Outcome: Completed/Met 05/25/2019 0834 by Annie Sable, RN Outcome: Adequate for Discharge   Problem: Safety: Goal: Ability to remain free from injury will improve Outcome: Completed/Met   Problem: Skin Integrity: Goal: Risk for impaired skin integrity will decrease Outcome: Completed/Met   Problem: Bowel/Gastric: Goal: Gastrointestinal status for postoperative course will improve 05/25/2019 1301 by Annie Sable, RN Outcome: Completed/Met 05/25/2019 0834 by Annie Sable, RN Outcome: Progressing   Problem: Pain Management: Goal: General experience of comfort will improve 05/25/2019 1301 by Annie Sable, RN Outcome: Completed/Met 05/25/2019 0834 by Annie Sable, RN Outcome: Adequate for  Discharge   Problem: Skin Integrity: Goal: Demonstration of wound healing without infection will improve Outcome: Completed/Met   Problem: Urinary Elimination: Goal: Ability to avoid or minimize complications of infection will improve 05/25/2019 1301 by Annie Sable, RN Outcome: Completed/Met 05/25/2019 0834 by Annie Sable, RN Outcome: Adequate for Discharge Goal: Ability to achieve and maintain urine output will improve Outcome: Completed/Met Goal: Home care management will improve 05/25/2019 1301 by Annie Sable, RN Outcome: Completed/Met 05/25/2019 0834 by Annie Sable, RN Outcome: Progressing

## 2019-05-25 NOTE — Progress Notes (Signed)
Pt discharged home today per Dr. Diona Fanti. Pt's IV site D/C'd and WDL. Pt's VSS. Pt's JP drain d/c'd and WDL. Pt educated on home care of site. Pt educated on use of standard and leg drainage bags for foley. Return demonstrated how to exchange bags satisfactorily. Verbalized understanding of the above and how to clean foley at home. Discharge instructions, prescriptions and at home medication list gone over with patient. Verbalized understanding. Pt is currently awaiting the arrival of spouse for transport home.

## 2019-05-25 NOTE — Discharge Instructions (Signed)

## 2019-05-26 ENCOUNTER — Encounter (HOSPITAL_COMMUNITY): Payer: Self-pay | Admitting: Emergency Medicine

## 2019-05-26 ENCOUNTER — Other Ambulatory Visit: Payer: Self-pay

## 2019-05-26 ENCOUNTER — Emergency Department (HOSPITAL_COMMUNITY)
Admission: EM | Admit: 2019-05-26 | Discharge: 2019-05-26 | Disposition: A | Discharge status: Home / Self Care | Payer: Medicare Other | Attending: Emergency Medicine | Admitting: Emergency Medicine

## 2019-05-26 DIAGNOSIS — I10 Essential (primary) hypertension: Secondary | ICD-10-CM | POA: Insufficient documentation

## 2019-05-26 DIAGNOSIS — Z79899 Other long term (current) drug therapy: Secondary | ICD-10-CM | POA: Insufficient documentation

## 2019-05-26 DIAGNOSIS — Z960 Presence of urogenital implants: Secondary | ICD-10-CM | POA: Diagnosis not present

## 2019-05-26 DIAGNOSIS — R319 Hematuria, unspecified: Secondary | ICD-10-CM | POA: Diagnosis not present

## 2019-05-26 LAB — BASIC METABOLIC PANEL
Anion gap: 11 (ref 5–15)
BUN: 14 mg/dL (ref 8–23)
CO2: 26 mmol/L (ref 22–32)
Calcium: 9.5 mg/dL (ref 8.9–10.3)
Chloride: 100 mmol/L (ref 98–111)
Creatinine, Ser: 0.85 mg/dL (ref 0.61–1.24)
GFR calc Af Amer: >60 mL/min (ref >60)
GFR calc non Af Amer: >60 mL/min (ref >60)
Glucose, Bld: 94 mg/dL (ref 70–99)
Potassium: 3.8 mmol/L (ref 3.5–5.1)
Sodium: 137 mmol/L (ref 135–145)

## 2019-05-26 LAB — CBC WITH DIFFERENTIAL/PLATELET
Abs Immature Granulocytes: 0.22 10*3/uL — ABNORMAL HIGH (ref 0.00–0.07)
Basophils Absolute: 0.1 10*3/uL (ref 0.0–0.1)
Basophils Relative: 1 %
Eosinophils Absolute: 0.4 10*3/uL (ref 0.0–0.5)
Eosinophils Relative: 2 %
HCT: 35.5 % — ABNORMAL LOW (ref 39.0–52.0)
Hemoglobin: 11.1 g/dL — ABNORMAL LOW (ref 13.0–17.0)
Immature Granulocytes: 1 %
Lymphocytes Relative: 7 %
Lymphs Abs: 1.2 10*3/uL (ref 0.7–4.0)
MCH: 30.8 pg (ref 26.0–34.0)
MCHC: 31.3 g/dL (ref 30.0–36.0)
MCV: 98.6 fL (ref 80.0–100.0)
Monocytes Absolute: 1.2 10*3/uL — ABNORMAL HIGH (ref 0.1–1.0)
Monocytes Relative: 7 %
Neutro Abs: 13.6 10*3/uL — ABNORMAL HIGH (ref 1.7–7.7)
Neutrophils Relative %: 82 %
Platelets: 237 10*3/uL (ref 150–400)
RBC: 3.6 MIL/uL — ABNORMAL LOW (ref 4.22–5.81)
RDW: 13.2 % (ref 11.5–15.5)
WBC: 16.6 10*3/uL — ABNORMAL HIGH (ref 4.0–10.5)
nRBC: 0.2 % (ref 0.0–0.2)

## 2019-05-26 MED ORDER — SULFAMETHOXAZOLE-TRIMETHOPRIM 800-160 MG PO TABS
1.0000 | ORAL_TABLET | Freq: Once | ORAL | Status: AC
  Administered 2019-05-26: 1 via ORAL
  Filled 2019-05-26: qty 1

## 2019-05-26 MED ORDER — SODIUM CHLORIDE 0.9 % IV SOLN
INTRAVENOUS | Status: DC
  Administered 2019-05-26: 15:00:00 via INTRAVENOUS

## 2019-05-26 MED ORDER — SULFAMETHOXAZOLE-TRIMETHOPRIM 800-160 MG PO TABS: 1.0000 | ORAL_TABLET | Freq: Two times a day (BID) | ORAL | 0 refills | Status: DC

## 2019-05-26 NOTE — Discharge Instructions (Signed)
It appears that your Foley catheter was temporarily obstructed but that has improved.  Your blood count is improved from the time when you had the prostatectomy.  Continue to drink and eat regularly.  Return here, if needed, for problems.

## 2019-05-26 NOTE — ED Triage Notes (Signed)
Patient here from home with complaints of blood clots coming out in catheter. Reports that he was discharged from hospital on yesterday. Recent prostate surgery.

## 2019-05-26 NOTE — ED Provider Notes (Signed)
[place] DEPT Provider Note   CSN: [id number] Arrival date & time: 05/26/19  1401     History   Chief Complaint Chief Complaint  Patient presents with  . Hematuria    HPI [name] is a 73 y.o. male.     HPI   Patient here because of blood clots passed and Foley catheter followed by bleeding around the catheter, both occurred today.  He denies new or worsening abdominal pain.  He had a prostatectomy, 4 days ago.  Discharge from hospital yesterday.  No known trauma to Foley catheter apparatus.  He denies fever, vomiting, dizziness or chest pain.  There are no other known modifying factors.  Past Medical History:  Diagnosis Date  . A-fib ([place])   . AICD (automatic cardioverter/defibrillator) present   . Arthritis   . Benign prostatic hyperplasia   . BPH (benign prostatic hypertrophy)   . Carpal tunnel syndrome    bilateral  . Carpal tunnel syndrome, bilateral   . Early cataract   . History of SCC (squamous cell carcinoma) of skin 10/04/2016  . HNP (herniated nucleus pulposus), cervical   . Hyperlipidemia   . Hypertension   . IBS (irritable bowel syndrome)   . Mild acid reflux occasional  . Obesity   . Other abnormal glucose   . Pelvic pain in male   . Pneumonia    " walking"  . PONV (postoperative nausea and vomiting)   . Pre-diabetes   . Vitamin D deficiency   . Wears glasses     Patient Active Problem List   Diagnosis Date Noted  . BPH (benign prostatic hyperplasia) 05/22/2019  . Insomnia 08/09/2018  . HNP (herniated nucleus pulposus), cervical 07/16/2018  . Aortic atherosclerosis ([place]) 10/11/2017  . Arthritis of carpometacarpal [facility]) joint of left thumb 07/27/2017  . Chronic pain of left thumb 07/27/2017  . History of SCC (squamous cell carcinoma) of skin 10/04/2016  . Atrial fibrillation ([place]) 08/01/2016  . Obesity (BMI 30.0-34.9) 09/30/2013  . Essential hypertension   . Hyperlipidemia, mixed   . BPH with  obstruction/lower urinary tract symptoms   . Abnormal glucose   . IBS (irritable bowel syndrome)   . Vitamin D deficiency     Past Surgical History:  Procedure Laterality Date  . ANTERIOR CERVICAL DECOMP/DISCECTOMY FUSION  01-25-2002   C4 - C5  . ANTERIOR CERVICAL DECOMP/DISCECTOMY FUSION Bilateral 07/16/2018   Procedure: REMOVAL OF ANTERIOR CERVICAL PLATE, ANTERIOR CERVICAL DECOMPRESSION/DISCECTOMY FUSION CERVICAL THREE- CERVICAL FOUR;  Surgeon: Jovita Gamma, MD;  Location: [facility];  Service: Neurosurgery;  Laterality: Bilateral;  REMOVAL OF ANTERIOR CERVICAL PLATE, ANTERIOR CERVICAL DECOMPRESSION/DISCECTOMY FUSION CERVICAL THREE- CERVICAL FOUR  . ANTERIOR CRUCIATE LIGAMENT REPAIR  1990   RIGHT  . APPENDECTOMY  02-07-2007  . CARPAL TUNNEL RELEASE Right 07/16/2018   Procedure: CARPAL TUNNEL RELEASE;  Surgeon: Jovita Gamma, MD;  Location: [place];  Service: Neurosurgery;  Laterality: Right;  CARPAL TUNNEL RELEASE  . CERVICAL FUSION  2000   C5 - 6  . COLONOSCOPY  05-30-2011   HEMORRHOIDS  . HERNIA REPAIR    . HIP SURGERY  1999   LEFT HIP --  REMOVAL CALCIUM BUILD-UP  . LEFT ANKLE SURG.  1980  . LEFT INGUINAL HERNIA REPAIR  1970  . LEFT ROTATOR CUFF REPAIR  2002  . LOOP RECORDER INSERTION N/A 09/19/2016   Procedure: Loop Recorder Insertion;  Surgeon: Thompson Grayer, MD;  Location: [place] [facility];  Service: Cardiovascular;  Laterality: N/A;  .  TENDON RECONSTRUCTION Left 07/17/2013   Procedure: LEFT ELBOW LATERAL RECONSTRUCTION;  Surgeon: Schuyler Amor, MD;  Location: [place];  Service: Orthopedics;  Laterality: Left;  . TONSILLECTOMY    . TRIGGER FINGER RELEASE Right 07/17/2013   Procedure: RELEASE A-1 PULLEY RIGHT RING FINGER;  Surgeon: Schuyler Amor, MD;  Location: [place];  Service: Orthopedics;  Laterality: Right;  . UMBILICAL HERNIA REPAIR  2003  . XI ROBOTIC ASSISTED SIMPLE PROSTATECTOMY N/A 05/22/2019   Procedure: XI ROBOTIC ASSISTED  SIMPLE PROSTATECTOMY;  Surgeon: Alexis Frock, MD;  Location: [facility];  Service: Urology;  Laterality: N/A;  3 HRS        Home Medications    Prior to Admission medications   Medication Sig Start Date End Date Taking? Authorizing Provider  ALPRAZolam Duanne Moron) 1 MG tablet Take 1/2 to 1 tablet at Bedtime ONLY if needed for Sleep & please try to limit to 5 days /week to avoid addiction (Must last 90 days) Patient taking differently: Take 0.5 mg by mouth at bedtime as needed for sleep.  09/04/18   Unk Pinto, MD  Carboxymethylcellulose Sodium (REFRESH TEARS OP) Place 1 drop into both eyes daily as needed (dry eyes).     [provider]  Cholecalciferol (VITAMIN D3) 5000 units CAPS Take 10,000 Units by mouth daily.     [provider]  diltiazem (CARDIZEM) 30 MG tablet Take 1 tablet (30 mg total) by mouth 2 (two) times daily as needed (q6h PRN for palpitations.). 02/28/18   Allred, Jeneen Rinks, MD  docusate sodium (COLACE) 100 MG capsule Take 1 capsule (100 mg total) by mouth 2 (two) times daily. 05/25/19   Franchot Gallo, MD  finasteride (PROSCAR) 5 MG tablet Take 5 mg by mouth daily.    [provider]  Flaxseed, Linseed, (FLAX SEED OIL PO) Take 1 capsule by mouth daily.    [provider]  flecainide (TAMBOCOR) 100 MG tablet Take 1 tablet (100 mg total) by mouth 2 (two) times daily. Keep scheduled appt with allred for refills 04/15/19   Sherran Needs, NP  losartan (COZAAR) 50 MG tablet Take 1 tablet Daily for BP Patient taking differently: Take 50 mg by mouth daily.  02/16/19   Unk Pinto, MD  methylPREDNISolone (MEDROL DOSEPAK) 4 MG TBPK tablet 6 day dose pack - take as directed Patient not taking: Reported on 05/13/2019 03/26/19   Hyatt, Max T, DPM  metroNIDAZOLE (METROCREAM) 0.75 % cream Apply topically 2 (two) times daily. Patient not taking: Reported on 05/13/2019 02/18/19   Vicie Mutters, PA-C  Multiple Vitamin (MULTIVITAMIN) tablet Take 1 tablet  by mouth daily.    [provider]  Omega-3 Fatty Acids (FISH OIL PO) Take 1 capsule by mouth 2 (two) times daily.    [provider]  oxybutynin (DITROPAN) 5 MG tablet Take 5 mg by mouth every 8 (eight) hours as needed for bladder spasms.    [provider]  oxyCODONE (OXY IR/ROXICODONE) 5 MG immediate release tablet Take 1-2 tablets (5-10 mg total) by mouth every 4 (four) hours as needed for moderate pain. 05/25/19   Franchot Gallo, MD  rosuvastatin (CRESTOR) 20 MG tablet Take 1 tablet (20 mg total) by mouth daily. 12/27/18   Unk Pinto, MD  sulfamethoxazole-trimethoprim (BACTRIM DS) 800-160 MG tablet Take 1 tablet by mouth 2 (two) times daily. 05/26/19   Daleen Bo, MD  verapamil (CALAN-SR) 120 MG CR tablet Take 1 tablet Daily with food for BP & Heart  Rhythm Patient taking differently: Take 120 mg by mouth daily.  04/01/19   Unk Pinto, MD  vitamin C (ASCORBIC ACID) 500 MG tablet Take 500 mg by mouth daily.    [provider]    Family History Family History  Problem Relation Age of Onset  . Heart disease Mother   . Stroke Mother 23  . Heart disease Father   . Diabetes Father   . Heart attack Father 85  . Alcohol abuse Father     Social History Social History   Tobacco Use  . Smoking status: Never Smoker  . Smokeless tobacco: Never Used  Substance Use Topics  . Alcohol use: Yes    Alcohol/week: 4.0 standard drinks    Types: 4 Standard drinks or equivalent per week    Comment: OCCASIONAL  . Drug use: No     Allergies   Xarelto [rivaroxaban], Atorvastatin, Clonazepam, and Tamsulosin hcl   Review of Systems Review of Systems  All other systems reviewed and are negative.    Physical Exam Updated Vital Signs BP (!) 162/81 (BP Location: Left Arm)   Pulse 89   Temp 98.9 F (37.2 C) (Oral)   Resp 19   SpO2 97%   Physical Exam Vitals signs and nursing note reviewed.  Constitutional:      General: He is not in  acute distress.    Appearance: He is well-developed. He is not ill-appearing, toxic-appearing or diaphoretic.  HENT:     Head: Normocephalic and atraumatic.     Right Ear: External ear normal.     Left Ear: External ear normal.  Eyes:     Conjunctiva/sclera: Conjunctivae normal.     Pupils: Pupils are equal, round, and reactive to light.  Neck:     Musculoskeletal: Normal range of motion and neck supple.     Trachea: Phonation normal.  Cardiovascular:     Rate and Rhythm: Normal rate and regular rhythm.     Heart sounds: Normal heart sounds.  Pulmonary:     Effort: Pulmonary effort is normal.     Breath sounds: Normal breath sounds.  Abdominal:     General: There is no distension.     Palpations: Abdomen is soft.     Tenderness: There is abdominal tenderness (Mild).     Comments: Healing laparoscopic surgical sites.  Musculoskeletal: Normal range of motion.  Skin:    General: Skin is warm and dry.  Neurological:     Mental Status: He is alert and oriented to person, place, and time.     Cranial Nerves: No cranial nerve deficit.     Sensory: No sensory deficit.     Motor: No abnormal muscle tone.     Coordination: Coordination normal.  Psychiatric:        Mood and Affect: Mood normal.        Behavior: Behavior normal.        Thought Content: Thought content normal.        Judgment: Judgment normal.      ED Treatments / Results  Labs (all labs ordered are listed, but only abnormal results are displayed) Labs Reviewed  CBC WITH DIFFERENTIAL/PLATELET - Abnormal; Notable for the following components:      Result Value   WBC 16.6 (*)    RBC 3.60 (*)    Hemoglobin 11.1 (*)    HCT 35.5 (*)    Neutro Abs 13.6 (*)    Monocytes Absolute 1.2 (*)    Abs Immature Granulocytes  0.22 (*)    All other components within normal limits  BASIC METABOLIC PANEL  URINALYSIS, ROUTINE W REFLEX MICROSCOPIC    EKG None  Radiology No results found.  Procedures Procedures  (including critical care time)  Medications Ordered in ED Medications  0.9 %  sodium chloride infusion ( Intravenous New Bag/Given (Non-Interop) 05/26/19 1504)  sulfamethoxazole-trimethoprim (BACTRIM DS) 800-160 MG per tablet 1 tablet (has no administration in time range)     Initial Impression / Assessment and Plan / ED Course  I have reviewed the triage vital signs and the nursing notes.  Pertinent labs & imaging results that were available during my care of the patient were reviewed by me and considered in my medical decision making (see chart for details).  Clinical Course as of May 26 1631  Sun May 26, 2019  1626 Normal  Basic metabolic panel [EW]  [id number] Normal except white count high 16.6.  Hemoglobin low at 5.1, improved from prior.  CBC with Differential(!) [EW]  1627 Case discussed with urology, Dr. Diona Fanti.  He recommends oral antibiotic, and follow-up with patient's primary urologist next week.   [EW]    Clinical Course User Index [EW] Daleen Bo, MD        Patient Vitals for the past 24 hrs:  BP Temp Temp src Pulse Resp SpO2  05/26/19 1413 (!) 162/81 98.9 F (37.2 C) Oral 89 19 97 %    4:27 PM Reevaluation with update and discussion. After initial assessment and treatment, an updated evaluation reveals he remains comfortable.  Bladder scan on arrival showed 0 mL urine in the urinary bladder.  Findings discussed with the patient and all questions were answered. Daleen Bo   Medical Decision Making: Hematuria with clots secondary to recent prostatectomy.  Patient had sensation of blockage of the catheter, which resolved spontaneously when he passed some clots while in the emergency department.  Hemoglobin improved from prior.  Mild white blood cell count elevation, concerning for infection.  Septra started in the ED.  He is not currently taking anticoagulants, and plans on starting them next week.  CRITICAL CARE-no Performed by: Daleen Bo  Nursing  Notes Reviewed/ Care Coordinated Applicable Imaging Reviewed Interpretation of Laboratory Data incorporated into ED treatment  The patient appears reasonably screened and/or stabilized for discharge and I doubt any other medical condition or other [facility] requiring further screening, evaluation, or treatment in the ED at this time prior to discharge.  Plan: Home Medications-continue usual; Home Treatments-rest, fluids, catheter care at home; return here if the recommended treatment, does not improve the symptoms; Recommended follow up-urology follow-up 3 to 4 days.   Final Clinical Impressions(s) / ED Diagnoses   Final diagnoses:  Hematuria, unspecified type    ED Discharge Orders         Ordered    sulfamethoxazole-trimethoprim (BACTRIM DS) 800-160 MG tablet  2 times daily     05/26/19 1630           Daleen Bo, MD 05/26/19 [id number]

## 2019-05-26 NOTE — ED Notes (Signed)
Bladder scan showed 0mL.  

## 2019-05-27 NOTE — Discharge Summary (Signed)
Patient ID: [name] MRN: [id number] DOB/AGE: [birth year] 73 y.o.  Admit date: 05/22/2019 Discharge date: 05/27/2019  Primary Care Physician:  Unk Pinto, MD  Discharge Diagnoses: BPH   Discharge Medications: Allergies as of 05/25/2019      Reactions   Xarelto [rivaroxaban] Other (See Comments)   Severe body aches   Atorvastatin Other (See Comments)   Myalgias   Clonazepam Other (See Comments)   "Made me jittery"   Tamsulosin Hcl Other (See Comments)   Hypotension      Medication List    STOP taking these medications   alfuzosin 10 MG 24 hr tablet Commonly known as: UROXATRAL   Eliquis 5 MG Tabs tablet Generic drug: apixaban     TAKE these medications   ALPRAZolam 1 MG tablet Commonly known as: XANAX Take 1/2 to 1 tablet at Bedtime ONLY if needed for Sleep & please try to limit to 5 days /week to avoid addiction (Must last 90 days) What changed:   how much to take  how to take this  when to take this  reasons to take this  additional instructions   diltiazem 30 MG tablet Commonly known as: Cardizem Take 1 tablet (30 mg total) by mouth 2 (two) times daily as needed (q6h PRN for palpitations.).   docusate sodium 100 MG capsule Commonly known as: COLACE Take 1 capsule (100 mg total) by mouth 2 (two) times daily.   finasteride 5 MG tablet Commonly known as: PROSCAR Take 5 mg by mouth daily.   FISH OIL PO Take 1 capsule by mouth 2 (two) times daily.   FLAX SEED OIL PO Take 1 capsule by mouth daily.   flecainide 100 MG tablet Commonly known as: TAMBOCOR Take 1 tablet (100 mg total) by mouth 2 (two) times daily. Keep scheduled appt with allred for refills   losartan 50 MG tablet Commonly known as: COZAAR Take 1 tablet Daily for BP What changed:   how much to take  how to take this  when to take this  additional instructions   methylPREDNISolone 4 MG Tbpk tablet Commonly known as: MEDROL DOSEPAK 6 day dose pack - take as  directed   metroNIDAZOLE 0.75 % cream Commonly known as: METROCREAM Apply topically 2 (two) times daily.   multivitamin tablet Take 1 tablet by mouth daily.   oxybutynin 5 MG tablet Commonly known as: DITROPAN Take 5 mg by mouth every 8 (eight) hours as needed for bladder spasms.   oxyCODONE 5 MG immediate release tablet Commonly known as: Oxy IR/ROXICODONE Take 1-2 tablets (5-10 mg total) by mouth every 4 (four) hours as needed for moderate pain.   REFRESH TEARS OP Place 1 drop into both eyes daily as needed (dry eyes).   rosuvastatin 20 MG tablet Commonly known as: CRESTOR Take 1 tablet (20 mg total) by mouth daily.   verapamil 120 MG CR tablet Commonly known as: CALAN-SR Take 1 tablet Daily with food for BP & Heart Rhythm What changed:   how much to take  how to take this  when to take this  additional instructions   vitamin C 500 MG tablet Commonly known as: ASCORBIC ACID Take 500 mg by mouth daily.   Vitamin D3 125 MCG (5000 UT) Caps Take 10,000 Units by mouth daily.        Significant Diagnostic Studies:  No results found.  Brief H and P: For complete details please refer to admission H and P, but in brief patient has BPH with  retention secondary to a huge prostate.  He is admitted for robotic assisted simple prostatectomy.  Hospital Course: The patient underwent robotic-assisted simple prostatectomy on the day of admission by Dr. Tresa Moore.  His hemoglobin decreased gradually postoperatively, but he did not require blood transfusion.  At first bladder was irrigated with CBI.  This was eventually discontinued.  Patient was eventually discharged from the hospital on 11/14.  At that time urine was clearing somewhat.  He was on a regular diet, ambulating, and it had no postoperative complications. Active Problems:   BPH (benign prostatic hyperplasia)   Day of Discharge BP 105/60 (BP Location: Right Arm)   Pulse 70   Temp 99 F (37.2 C) (Oral)   Resp 18    Ht 6' (1.829 m)   Wt 105.3 kg   SpO2 94%   BMI 31.47 kg/m   No results found for this or any previous visit (from the past 24 hour(s)).  Physical Exam: General: Alert and awake oriented x3 not in any acute distress. HEENT: anicteric sclera, pupils reactive to light and accommodation CVS: S1-S2 clear no murmur rubs or gallops Chest: clear to auscultation bilaterally, no wheezing rales or rhonchi Abdomen: soft nontender, nondistended, normal bowel sounds, no organomegaly Extremities: no cyanosis, clubbing or edema noted bilaterally Neuro: Cranial nerves II-XII intact, no focal neurological deficits  Disposition: Home  Diet: Regular  Activity: Activity restrictions discussed with the patient   Disposition and Follow-up:  Discharge Instructions    Care order/instruction   Complete by: As directed    remove JP drain     Follow-up appointment already set up   [place]  Pathology review  [place]    Alexis Frock, MD On 06/03/2019.   Specialty: Urology Why: at 9:30 for MD visit and catheter removal.  Contact information: [place] [id number] [phone]           Time spent on Discharge:  15 minutes  Signed: Lillette Boxer Mignonne Afonso 05/27/2019, 7:34 AM

## 2019-05-28 LAB — SURGICAL PATHOLOGY

## 2019-06-04 ENCOUNTER — Telehealth (HOSPITAL_COMMUNITY): Payer: Self-pay | Admitting: *Deleted

## 2019-06-04 NOTE — Telephone Encounter (Signed)
Patient wanted to report having to be off Eliquis due to prostate surgery and complications with bleeding post-op. Per urology pt should not restart Eliquis until bleeding as stopped. Pt states he is still bleeding as of today. Having issues with low BP - has been holding verapamil but continues losartan - SBP 90s - will hold losartan and resume verapamil since on daily flecainide. Pt verbalized understanding.

## 2019-06-05 NOTE — Op Note (Signed)
NAME: [name], [name] MEDICAL RECORD [id number] ACCOUNT [id number] DATE OF BIRTH:10/26/1945 FACILITY: [facility] LOCATION:  PHYSICIAN:Shaya Reddick Tresa Moore, MD  OPERATIVE REPORT  DATE OF PROCEDURE:  05/22/2019  PREOPERATIVE DIAGNOSIS:  Large prostatic hypertrophy with urinary retention.  PROCEDURE:  Robotic-assisted laparoscopic simple prostatectomy.  ESTIMATED BLOOD LOSS:  200 mL.  COMPLICATIONS:  None.  SPECIMENS:  Prostate adenoma for permanent pathology.  FINDINGS:  Mostly bilobar prostatic hypertrophy, small median lobe.  Prostate anatomy as anticipated.  ASSISTANT:  Sharlot Gowda, MD  DRAINS:   1.  Jackson-Pratt drain to bulb suction. 2.  Foley catheter to straight drain, 3-way type 20 mL sterile water in the balloon and normal saline irrigation.  INDICATIONS:  The patient is a pleasant 73 year old man who has had a long history of obstructive and irritative voiding from large prostatic hypertrophy.  He has been on medical therapy, but has progressed in terms of symptomatology to frank urinary  retention and his prostate volume has also risen significantly as well by imaging.  He failed a trial of void times several.  Options were discussed for further management including outlet procedures of various types and given his very large gland size  at over 100 grams, a simple prostatectomy being the most definitive outlook procedure and he wished to proceed.  Informed consent was obtained and placed in medical record.  DESCRIPTION OF PROCEDURE:  Patient being identified, verified procedure being robotic simple prostatectomy was confirmed.  Procedure timeout was performed.  Intravenous antibiotics administered.  General endotracheal anesthesia induced.  The patient was  placed into a low lithotomy position, sterile field was created prepped and draped base of the penis, perineum and proximal thighs using iodine.  Cystourethroscopy was performed in his infraxiphoid having using chlorhexidine  gluconate and a Foley  catheter was placed free to straight drain after clipper shaving.  A high-flow, low-pressure pneumoperitoneum was obtained using Veress technique in the supraumbilical midline and having passed the aspiration and drop test an 8 mm robotic camera port was  then placed in same location.  Laparoscopic examination peritoneal cavity and there were no adhesions, no visceral injury.  Distal ports in place as follows.  Right paramedian 8 mm robotic port, right far lateral 12 mm AirSeal port, right paramedian 5  mm suction port, left paramedian 8 mm robotic port, left far lateral 8 mm robotic port.  Robot was docked and passed electronic checks.  Attention was directed at development of the space of Retzius.  Incision made lateral to right medial umbilical  ligament from the midline towards the internal ring coursing along the iliac vessels.  The right vas deferens was encountered and purposely ligated using the medial bucket handle and the right bladder wall was swept away from the pelvic sidewall towards  the area of the endopelvic fascia on the right side.  A mirror image dissection was performed on the left side and anterior attachments were taken down using cautery scissors.  The bladder neck prostate junction area was defatted to better demarcate this  junction and an inverted U-type incision was made in this location approximately 2 cm proximal to the bladder, prostate junction to keep a rim of detrusor in each plane of dissection.  This allowed excellent visualization of the large prostate adenoma,  which consisted of right greater than left bilobar hypertrophy and a small median lobe as well.  Ureteral orifices and trigonal ridge also easily seen via this cystotomy.  Figure-of-8 stay sutures were applied into the right lobe, left lobe, median  lobe  of the prostate respectively for stay sutures.  An incision was made circumferentially and the adenoma plane was entered first posteriorly  and carried towards the apex of the prostate then carried up to the right side, then the left side and finally,  anteriorly at 12 o'clock position inherently performing a butterfly type incision on the adenoma, which allowed visualization of the apex.  This completely freed up the large prostate adenoma, which was placed in EndoCatch bag for later retrieval.   Additional hemostasis was achieved with point coagulation current of the adenoma fossa.  Digital rectal exam was then performed with indicator glove.  No active evidence of rectal violation was noted.  There was a very small rent in the prostate capsule  on the left side and this was reapproximated using running 3-0 V-Loc suture.  Next, a mucosal advancement was performed using a double arm 3-0 V-Loc suture, bringing the bladder neck mucosa in apposition to the membranous urethral stump from the 4  o'clock to the 8 o'clock position, which created an excellent mucosal bridge across the prostate fossa to prevent bladder neck contracture and aid an easier catheterization.  The remainder of the cystotomy was then closed using 2 separate running suture  lines of 2-0 V-Loc, which met in the midline at 12 o'clock position and a new 3-way type 22-French Foley catheter was placed, 20 mL sterile in the balloon, which irrigated quantitatively under laparoscopic vision.  Hemostasis appeared excellent.  Sponge,  needle counts were correct.  A closed suction drain was brought through previous left lateral most port site into the area of the peritoneal cavity.  The previous right 12 mm assistant port site was closed with fascia using Carter-Thomason suture passer  and 0 Vicryl.  Robot was undocked.  Specimen was retrieved by extending the previous camera port site superiorly for a distance of approximately 3.5 cm, removing the prostate adenoma setting aside for permanent pathology.  Extraction site was then  closed with fascia using figure-of-eight PDS x3 followed by  reapproximation of Scarpa's with a running Vicryl.  All incision sites were infiltrated with dilute lipolyzed Marcaine and then closed at level of skin using subcuticular Monocryl followed by  Dermabond and procedure was terminated.  The patient tolerated the procedure well.  No immediate complications.  The patient taken to postanesthesia care in stable condition.  He will remain on gentle bladder irrigation overnight.  CN/NUANCE  D:05/22/2019 T:05/23/2019 JOB:[id number]

## 2019-06-09 ENCOUNTER — Encounter: Payer: Self-pay | Admitting: Internal Medicine

## 2019-06-09 LAB — CUP PACEART REMOTE DEVICE CHECK
Date Time Interrogation Session: 20201127103814
Implantable Pulse Generator Implant Date: 20180312

## 2019-06-09 NOTE — Progress Notes (Signed)
Comprehensive Evaluation & Examination     This very nice 73 y.o. MWM presents for a Screening /Preventative Visit & comprehensive evaluation and management of multiple medical co-morbidities.  Patient has been followed for HTN, HLD, Prediabetes and Vitamin D Deficiency.      Patient had recent out-patient TURP on Nov 11 by Dr Carrie Mew.     In September, he was seen by Dr Milinda Pointer for suspected Gout of the Lt ankle and Uric acid was Normal.     HTN predates since 2007. Patient's BP has been controlled at home.  Today's BP is at goal - 128/74.  In 2018, he was discovered in pAfib & had loop recorder implanted. Patient is on Eliquis per Dr Rayann Heman for CHADsVasc 2.  Patient denies any cardiac symptoms as chest pain, palpitations, shortness of breath, dizziness or ankle swelling.     Patient's hyperlipidemia had been  controlled with diet and Rosuvastatin. Patient denies myalgias or other medication SE's. Last lipids were not at goal:  Lab Results  Component Value Date   CHOL 188 02/18/2019   HDL 45 02/18/2019   LDLCALC 112 (H) 02/18/2019   TRIG 195 (H) 02/18/2019   CHOLHDL 4.2 02/18/2019      Patient has Moderate Obesity (BMI 31+) and  hx/o prediabetes (A1c 5.7% / 2013) and patient denies reactive hypoglycemic symptoms, visual blurring, diabetic polys or paresthesias. Last A1c was Normal & at goal:  Lab Results  Component Value Date   HGBA1C 5.1 05/20/2019       Finally, patient has history of Vitamin D Deficiency ("33" / 2008)  and last vitamin D was at goal:  Lab Results  Component Value Date   VD25OH 85 11/13/2018   Current Outpatient Medications on File Prior to Visit  Medication Sig  . ALPRAZolam (XANAX) 1 MG tablet Take 1/2 to 1 tablet at Bedtime ONLY if needed for Sleep & please try to limit to 5 days /week to avoid addiction (Must last 90 days) (Patient taking differently: Take 0.5 mg by mouth at bedtime as needed for sleep. )  . Carboxymethylcellulose Sodium (REFRESH TEARS OP)  Place 1 drop into both eyes daily as needed (dry eyes).   . Cholecalciferol (VITAMIN D3) 5000 units CAPS Take 10,000 Units by mouth daily.   Marland Kitchen diltiazem (CARDIZEM) 30 MG tablet Take 1 tablet (30 mg total) by mouth 2 (two) times daily as needed (q6h PRN for palpitations.).  Marland Kitchen finasteride (PROSCAR) 5 MG tablet Take 5 mg by mouth daily.  . Flaxseed, Linseed, (FLAX SEED OIL PO) Take 1 capsule by mouth daily.  . flecainide (TAMBOCOR) 100 MG tablet Take 1 tablet (100 mg total) by mouth 2 (two) times daily. Keep scheduled appt with allred for refills  . losartan (COZAAR) 50 MG tablet Take 1 tablet Daily for BP (Patient taking differently: Take 50 mg by mouth daily. )  . Multiple Vitamin (MULTIVITAMIN) tablet Take 1 tablet by mouth daily.  . Omega-3 Fatty Acids (FISH OIL PO) Take 1 capsule by mouth 2 (two) times daily.  Marland Kitchen oxybutynin (DITROPAN) 5 MG tablet Take 5 mg by mouth every 8 (eight) hours as needed for bladder spasms.  Marland Kitchen oxyCODONE (OXY IR/ROXICODONE) 5 MG immediate release tablet Take 1-2 tablets (5-10 mg total) by mouth every 4 (four) hours as needed for moderate pain.  . rosuvastatin (CRESTOR) 20 MG tablet Take 1 tablet (20 mg total) by mouth daily.  . verapamil (CALAN-SR) 120 MG CR tablet Take 1 tablet Daily with food  for BP & Heart Rhythm (Patient taking differently: Take 120 mg by mouth daily. )  . vitamin C (ASCORBIC ACID) 500 MG tablet Take 500 mg by mouth daily.   No current facility-administered medications on file prior to visit.    Allergies  Allergen Reactions  . Xarelto [Rivaroxaban] Other (See Comments)    Severe body aches  . Atorvastatin Other (See Comments)    Myalgias  . Clonazepam Other (See Comments)    "Made me jittery"  . Tamsulosin Hcl Other (See Comments)    Hypotension   Past Medical History:  Diagnosis Date  . A-fib ([place])   . Arthritis   . Benign prostatic hyperplasia   . BPH (benign prostatic hypertrophy)   . Carpal tunnel syndrome, bilateral   . Early  cataract   . History of SCC (squamous cell carcinoma) of skin 10/04/2016  . HNP (herniated nucleus pulposus), cervical   . Hyperlipidemia   . Hypertension 2007  . IBS (irritable bowel syndrome)   . Mild acid reflux occasional  . Obesity   . Other abnormal glucose   . Pneumonia    " walking"  . Pre-diabetes   . Vitamin D deficiency   . Wears glasses    Health Maintenance  Topic Date Due  . Fecal DNA (Cologuard)  05/21/2021  . TETANUS/TDAP  12/15/2025  . INFLUENZA VACCINE  Completed  . Hepatitis C Screening  Completed  . PNA vac Low Risk Adult  Completed   Immunization History  Administered Date(s) Administered  . DTaP 07/11/2005  . Influenza Whole 04/02/2013  . Influenza, High Dose Seasonal PF 05/01/2014, 05/21/2015, 03/29/2016, 05/31/2017, 05/05/2018, 05/01/2019  . Pneumococcal Conjugate-13 05/01/2014  . Pneumococcal Polysaccharide-23 09/08/2008, 03/29/2016  . Td 12/16/2015  . Zoster 12/09/2008   Last Colon -   08/08/2006 - Dr Deatra Ina - few tic's  Flexible Sigmoidoscopy -05/30/2011 - Dr Deatra Ina - Negative     Past Surgical History:  Procedure Laterality Date  . ANTERIOR CERVICAL DECOMP/DISCECTOMY FUSION  01-25-2002   C4 - C5  . ANTERIOR CERVICAL DECOMP/DISCECTOMY FUSION Bilateral 07/16/2018   Procedure: REMOVAL OF ANTERIOR CERVICAL PLATE, ANTERIOR CERVICAL DECOMPRESSION/DISCECTOMY FUSION CERVICAL THREE- CERVICAL FOUR;  Surgeon: Jovita Gamma, MD;  Location: [place];  Service: Neurosurgery;  Laterality: Bilateral;  REMOVAL OF ANTERIOR CERVICAL PLATE, ANTERIOR CERVICAL DECOMPRESSION/DISCECTOMY FUSION CERVICAL THREE- CERVICAL FOUR  . ANTERIOR CRUCIATE LIGAMENT REPAIR  1990   RIGHT  . APPENDECTOMY  02-07-2007  . CARPAL TUNNEL RELEASE Right 07/16/2018   Procedure: CARPAL TUNNEL RELEASE;  Surgeon: Jovita Gamma, MD;  Location: [place];  Service: Neurosurgery;  Laterality: Right;  CARPAL TUNNEL RELEASE  . CERVICAL FUSION  2000   C5 - 6  . COLONOSCOPY  05-30-2011   HEMORRHOIDS   . HERNIA REPAIR    . HIP SURGERY  1999   LEFT HIP --  REMOVAL CALCIUM BUILD-UP  . LEFT ANKLE SURG.  1980  . LEFT INGUINAL HERNIA REPAIR  1970  . LEFT ROTATOR CUFF REPAIR  2002  . LOOP RECORDER INSERTION N/A 09/19/2016   Procedure: Loop Recorder Insertion;  Surgeon: Thompson Grayer, MD;  Location: [facility];  Service: Cardiovascular;  Laterality: N/A;  . TENDON RECONSTRUCTION Left 07/17/2013   Procedure: LEFT ELBOW LATERAL RECONSTRUCTION;  Surgeon: Schuyler Amor, MD;  Location: [facility];  Service: Orthopedics;  Laterality: Left;  . TONSILLECTOMY    . TRIGGER FINGER RELEASE Right 07/17/2013   Procedure: RELEASE A-1 PULLEY RIGHT RING FINGER;  Surgeon: Schuyler Amor,  MD;  Location: [place];  Service: Orthopedics;  Laterality: Right;  . UMBILICAL HERNIA REPAIR  2003  . XI ROBOTIC ASSISTED SIMPLE PROSTATECTOMY N/A 05/22/2019   Procedure: XI ROBOTIC ASSISTED SIMPLE PROSTATECTOMY;  Surgeon: Alexis Frock, MD;  Location: [facility];  Service: Urology;  Laterality: N/A;  3 HRS   Family History  Problem Relation Age of Onset  . Heart disease Mother   . Stroke Mother 47  . Heart disease Father   . Diabetes Father   . Heart attack Father 75  . Alcohol abuse Father    Social History   Socioeconomic History  . Marital status: Married    Spouse name: Wells Guiles  . Number of children: 1  Occupational History  . Occupation: Personnel officer  Tobacco Use  . Smoking status: Never Smoker  . Smokeless tobacco: Never Used  Substance and Sexual Activity  . Alcohol use: Yes    Alcohol/week: 4.0 standard drinks    Types: 4 Standard drinks or equivalent per week    Comment: OCCASIONAL    ROS Constitutional: Denies fever, chills, weight loss/gain, headaches, insomnia,  night sweats or change in appetite. Does c/o fatigue. Eyes: Denies redness, blurred vision, diplopia, discharge, itchy or watery eyes.  ENT: Denies discharge, congestion, post nasal drip,  epistaxis, sore throat, earache, hearing loss, dental pain, Tinnitus, Vertigo, Sinus pain or snoring.  Cardio: Denies chest pain, palpitations, irregular heartbeat, syncope, dyspnea, diaphoresis, orthopnea, PND, claudication or edema Respiratory: denies cough, dyspnea, DOE, pleurisy, hoarseness, laryngitis or wheezing.  Gastrointestinal: Denies dysphagia, heartburn, reflux, water brash, pain, cramps, nausea, vomiting, bloating, diarrhea, constipation, hematemesis, melena, hematochezia, jaundice or hemorrhoids Genitourinary: Denies dysuria, frequency, urgency, nocturia, hesitancy, discharge, hematuria or flank pain Musculoskeletal: Denies arthralgia, myalgia, stiffness, Jt. Swelling, pain, limp or strain/sprain. Denies Falls. Skin: Denies puritis, rash, hives, warts, acne, eczema or change in skin lesion Neuro: No weakness, tremor, incoordination, spasms, paresthesia or pain Psychiatric: Denies confusion, memory loss or sensory loss. Denies Depression. Endocrine: Denies change in weight, skin, hair change, nocturia, and paresthesia, diabetic polys, visual blurring or hyper / hypo glycemic episodes.  Heme/Lymph: No excessive bleeding, bruising or enlarged lymph nodes.  Physical Exam  BP 128/74   Pulse 63   Temp 98.6 F (37 C)   Ht 6' (1.829 m)   Wt 223 lb (101.2 kg)   SpO2 98%   BMI 30.24 kg/m   General Appearance: Over nourished and well groomed and in no apparent distress.  Eyes: PERRLA, EOMs, conjunctiva no swelling or erythema, normal fundi and vessels. Sinuses: No frontal/maxillary tenderness ENT/Mouth: EACs patent / TMs  nl. Nares clear without erythema, swelling, mucoid exudates. Oral hygiene is good. No erythema, swelling, or exudate. Tongue normal, non-obstructing. Tonsils not swollen or erythematous. Hearing normal.  Neck: Supple, thyroid not palpable. No bruits, nodes or JVD. Respiratory: Respiratory effort normal.  BS equal and clear bilateral without rales, rhonci, wheezing  or stridor. Cardio: Heart sounds are normal with regular rate and rhythm and no murmurs, rubs or gallops. Peripheral pulses are normal and equal bilaterally without edema. No aortic or femoral bruits. Chest: symmetric with normal excursions and percussion.  Abdomen: Soft, with Nl bowel sounds. Nontender, no guarding, rebound, hernias, masses, or organomegaly.  Lymphatics: Non tender without lymphadenopathy.  Musculoskeletal: Full ROM all peripheral extremities, joint stability, 5/5 strength, and normal gait. Skin: Warm and dry without rashes, lesions, cyanosis, clubbing or  ecchymosis.  Neuro: Cranial nerves intact, reflexes equal bilaterally. Normal muscle tone, no cerebellar symptoms.  Sensation intact.  Pysch: Alert and oriented X 3 with normal affect, insight and judgment appropriate.   Assessment and Plan  1. Essential hypertension  - EKG 12-Lead - [place], retroperitnl abd,  ltd - CBC with Diff - COMPLETE METABOLIC PANEL WITH GFR - Magnesium - TSH  2. Hyperlipidemia, mixed  - EKG 12-Lead - [place], retroperitnl abd,  ltd - Lipid Profile - TSH  3. Abnormal glucose  - EKG 12-Lead - [place], retroperitnl abd,  ltd - Insulin, random - Hemoglobin A1c ([facility])  4. Vitamin D deficiency  - Vitamin D (25 hydroxy)  5. Prediabetes  - EKG 12-Lead - [place], retroperitnl abd,  ltd - Insulin, random  6. Paroxysmal atrial fibrillation ([facility])  - EKG 12-Lead  7. Screening for colorectal cancer  - POC Hemoccult Bld/Stl   8. Gout,   - Uric acid  9. Screening for ischemic heart disease  - EKG 12-Lead  10. FHx: heart disease  - EKG 12-Lead  11. Aortic atherosclerosis ([facility])  - [place], retroperitnl abd,  ltd  12. Screening for AAA (aortic abdominal aneurysm)  - [place], retroperitnl abd,  ltd  13. Medication management  - Uric acid - CBC with Diff - COMPLETE METABOLIC PANEL WITH GFR - Magnesium - Lipid Profile - TSH - Insulin, random - Vitamin D (25 hydroxy) - Hemoglobin A1c  ([facility])         Patient was counseled in prudent diet, weight control to achieve/maintain BMI less than 25, BP monitoring, regular exercise and medications as discussed.  Discussed med effects and SE's. Routine screening labs and tests as requested with regular follow-up as recommended. Over 40 minutes of exam, counseling, chart review and high complex critical decision making was performed   Kirtland Bouchard, MD

## 2019-06-09 NOTE — Patient Instructions (Signed)

## 2019-06-10 ENCOUNTER — Other Ambulatory Visit: Payer: Self-pay

## 2019-06-10 ENCOUNTER — Ambulatory Visit (INDEPENDENT_AMBULATORY_CARE_PROVIDER_SITE_OTHER): Payer: Medicare Other | Admitting: *Deleted

## 2019-06-10 ENCOUNTER — Ambulatory Visit (INDEPENDENT_AMBULATORY_CARE_PROVIDER_SITE_OTHER): Payer: Medicare Other | Admitting: Internal Medicine

## 2019-06-10 ENCOUNTER — Encounter: Payer: Self-pay | Admitting: Internal Medicine

## 2019-06-10 VITALS — BP 128/74 | HR 63 | Temp 98.6°F | Ht 72.0 in | Wt 223.0 lb

## 2019-06-10 DIAGNOSIS — I700 Atherosclerosis of aorta: Secondary | ICD-10-CM | POA: Diagnosis not present

## 2019-06-10 DIAGNOSIS — R7309 Other abnormal glucose: Secondary | ICD-10-CM

## 2019-06-10 DIAGNOSIS — E559 Vitamin D deficiency, unspecified: Secondary | ICD-10-CM | POA: Diagnosis not present

## 2019-06-10 DIAGNOSIS — R7303 Prediabetes: Secondary | ICD-10-CM

## 2019-06-10 DIAGNOSIS — Z79899 Other long term (current) drug therapy: Secondary | ICD-10-CM | POA: Diagnosis not present

## 2019-06-10 DIAGNOSIS — I480 Paroxysmal atrial fibrillation: Secondary | ICD-10-CM

## 2019-06-10 DIAGNOSIS — Z8249 Family history of ischemic heart disease and other diseases of the circulatory system: Secondary | ICD-10-CM

## 2019-06-10 DIAGNOSIS — Z1212 Encounter for screening for malignant neoplasm of rectum: Secondary | ICD-10-CM

## 2019-06-10 DIAGNOSIS — M109 Gout, unspecified: Secondary | ICD-10-CM | POA: Diagnosis not present

## 2019-06-10 DIAGNOSIS — R310 Gross hematuria: Secondary | ICD-10-CM | POA: Diagnosis not present

## 2019-06-10 DIAGNOSIS — E782 Mixed hyperlipidemia: Secondary | ICD-10-CM

## 2019-06-10 DIAGNOSIS — Z1211 Encounter for screening for malignant neoplasm of colon: Secondary | ICD-10-CM

## 2019-06-10 DIAGNOSIS — I10 Essential (primary) hypertension: Secondary | ICD-10-CM

## 2019-06-10 DIAGNOSIS — Z136 Encounter for screening for cardiovascular disorders: Secondary | ICD-10-CM | POA: Diagnosis not present

## 2019-06-10 DIAGNOSIS — R338 Other retention of urine: Secondary | ICD-10-CM | POA: Diagnosis not present

## 2019-06-11 LAB — HEMOGLOBIN A1C
Hgb A1c MFr Bld: 4.9 % of total Hgb (ref <5.7)
Mean Plasma Glucose: 94 (calc)
eAG (mmol/L): 5.2 (calc)

## 2019-06-11 LAB — URIC ACID: Uric Acid, Serum: 7 mg/dL (ref 4.0–8.0)

## 2019-06-11 LAB — LIPID PANEL
Cholesterol: 122 mg/dL (ref <200)
HDL: 36 mg/dL — ABNORMAL LOW (ref >=40)
LDL Cholesterol (Calc): 64 mg/dL (calc)
Non-HDL Cholesterol (Calc): 86 mg/dL (calc) (ref <130)
Total CHOL/HDL Ratio: 3.4 (calc) (ref <5.0)
Triglycerides: 135 mg/dL (ref <150)

## 2019-06-11 LAB — CBC WITH DIFFERENTIAL/PLATELET
Absolute Monocytes: 762 cells/uL (ref 200–950)
Basophils Absolute: 99 cells/uL (ref 0–200)
Basophils Relative: 1 %
Eosinophils Absolute: 238 cells/uL (ref 15–500)
Eosinophils Relative: 2.4 %
HCT: 34.7 % — ABNORMAL LOW (ref 38.5–50.0)
Hemoglobin: 11.6 g/dL — ABNORMAL LOW (ref 13.2–17.1)
Lymphs Abs: 2059 cells/uL (ref 850–3900)
MCH: 31.3 pg (ref 27.0–33.0)
MCHC: 33.4 g/dL (ref 32.0–36.0)
MCV: 93.5 fL (ref 80.0–100.0)
MPV: 9.3 fL (ref 7.5–12.5)
Monocytes Relative: 7.7 %
Neutro Abs: 6742 cells/uL (ref 1500–7800)
Neutrophils Relative %: 68.1 %
Platelets: 436 10*3/uL — ABNORMAL HIGH (ref 140–400)
RBC: 3.71 10*6/uL — ABNORMAL LOW (ref 4.20–5.80)
RDW: 12.7 % (ref 11.0–15.0)
Total Lymphocyte: 20.8 %
WBC: 9.9 10*3/uL (ref 3.8–10.8)

## 2019-06-11 LAB — COMPLETE METABOLIC PANEL WITH GFR
AG Ratio: 1.5 (calc) (ref 1.0–2.5)
ALT: 9 U/L (ref 9–46)
AST: 11 U/L (ref 10–35)
Albumin: 4.2 g/dL (ref 3.6–5.1)
Alkaline phosphatase (APISO): 84 U/L (ref 35–144)
BUN: 14 mg/dL (ref 7–25)
CO2: 23 mmol/L (ref 20–32)
Calcium: 9.9 mg/dL (ref 8.6–10.3)
Chloride: 105 mmol/L (ref 98–110)
Creat: 0.76 mg/dL (ref 0.70–1.18)
GFR, Est African American: 105 mL/min/{1.73_m2} (ref >=60)
GFR, Est Non African American: 91 mL/min/{1.73_m2} (ref >=60)
Globulin: 2.8 g/dL (calc) (ref 1.9–3.7)
Glucose, Bld: 93 mg/dL (ref 65–99)
Potassium: 3.9 mmol/L (ref 3.5–5.3)
Sodium: 140 mmol/L (ref 135–146)
Total Bilirubin: 0.3 mg/dL (ref 0.2–1.2)
Total Protein: 7 g/dL (ref 6.1–8.1)

## 2019-06-11 LAB — INSULIN, RANDOM: Insulin: 14.6 u[IU]/mL

## 2019-06-11 LAB — MAGNESIUM: Magnesium: 1.9 mg/dL (ref 1.5–2.5)

## 2019-06-11 LAB — VITAMIN D 25 HYDROXY (VIT D DEFICIENCY, FRACTURES): Vit D, 25-Hydroxy: 45 ng/mL (ref 30–100)

## 2019-06-11 LAB — TSH: TSH: 0.46 mIU/L (ref 0.40–4.50)

## 2019-06-17 DIAGNOSIS — Z03818 Encounter for observation for suspected exposure to other biological agents ruled out: Secondary | ICD-10-CM | POA: Diagnosis not present

## 2019-06-20 DIAGNOSIS — M62838 Other muscle spasm: Secondary | ICD-10-CM | POA: Diagnosis not present

## 2019-06-20 DIAGNOSIS — M6281 Muscle weakness (generalized): Secondary | ICD-10-CM | POA: Diagnosis not present

## 2019-06-20 DIAGNOSIS — M6289 Other specified disorders of muscle: Secondary | ICD-10-CM | POA: Diagnosis not present

## 2019-06-20 DIAGNOSIS — R300 Dysuria: Secondary | ICD-10-CM | POA: Diagnosis not present

## 2019-06-20 DIAGNOSIS — R3915 Urgency of urination: Secondary | ICD-10-CM | POA: Diagnosis not present

## 2019-06-20 DIAGNOSIS — R338 Other retention of urine: Secondary | ICD-10-CM | POA: Diagnosis not present

## 2019-06-24 ENCOUNTER — Telehealth (HOSPITAL_COMMUNITY): Payer: Self-pay | Admitting: *Deleted

## 2019-06-24 DIAGNOSIS — Z03818 Encounter for observation for suspected exposure to other biological agents ruled out: Secondary | ICD-10-CM | POA: Diagnosis not present

## 2019-06-24 NOTE — Telephone Encounter (Signed)
Patient called in stating he continues off eliquis due to continued bleeding due to recent urology complications. Pt has been in touch with urology regarding continued hematuria. Pt does report staying in NSR.

## 2019-06-26 ENCOUNTER — Other Ambulatory Visit: Payer: Self-pay | Admitting: Internal Medicine

## 2019-06-26 MED ORDER — ALPRAZOLAM 1 MG PO TABS: ORAL_TABLET | ORAL | 0 refills | Status: DC

## 2019-07-03 ENCOUNTER — Other Ambulatory Visit (HOSPITAL_COMMUNITY): Payer: Self-pay | Admitting: Nurse Practitioner

## 2019-07-03 NOTE — Progress Notes (Signed)
ILR remote 

## 2019-07-11 ENCOUNTER — Ambulatory Visit (INDEPENDENT_AMBULATORY_CARE_PROVIDER_SITE_OTHER): Payer: Medicare Other | Admitting: *Deleted

## 2019-07-11 DIAGNOSIS — I480 Paroxysmal atrial fibrillation: Secondary | ICD-10-CM

## 2019-07-11 LAB — CUP PACEART REMOTE DEVICE CHECK
Date Time Interrogation Session: 20201230230705
Implantable Pulse Generator Implant Date: 20180312

## 2019-07-12 NOTE — Progress Notes (Signed)
ILR remote 

## 2019-07-22 ENCOUNTER — Encounter: Payer: Self-pay | Admitting: Internal Medicine

## 2019-07-22 ENCOUNTER — Telehealth (INDEPENDENT_AMBULATORY_CARE_PROVIDER_SITE_OTHER): Payer: Medicare Other | Admitting: Internal Medicine

## 2019-07-22 VITALS — BP 122/86 | HR 74 | Ht 72.0 in | Wt 220.0 lb

## 2019-07-22 DIAGNOSIS — Z03818 Encounter for observation for suspected exposure to other biological agents ruled out: Secondary | ICD-10-CM | POA: Diagnosis not present

## 2019-07-22 DIAGNOSIS — I10 Essential (primary) hypertension: Secondary | ICD-10-CM

## 2019-07-22 DIAGNOSIS — I480 Paroxysmal atrial fibrillation: Secondary | ICD-10-CM

## 2019-07-22 DIAGNOSIS — D6869 Other thrombophilia: Secondary | ICD-10-CM

## 2019-07-22 NOTE — Progress Notes (Signed)
Electrophysiology TeleHealth Note  Due to national recommendations of social distancing due to [place] 19, an audio telehealth visit is felt to be most appropriate for this patient at this time.  Verbal consent was obtained by me for the telehealth visit today.  The patient does not have capability for a virtual visit.  A phone visit is therefore required today.   Date:  07/22/2019   ID:  HILDA STOLLE, DOB 22-Jul-1945, MRN [id number]  Location: patient's home  Provider location:  [facility]  Evaluation Performed: Follow-up visit  PCP:  Unk Pinto, MD   Electrophysiologist:  Dr Rayann Heman  Chief Complaint:  palpitations  History of Present Illness:    HAYYAN ELGART is a 74 y.o. male who presents via telehealth conferencing today.  Since last being seen in our clinic, the patient reports doing reasonably well.  He is recovering from a large radical prostatectomy surgery.  He continues to have intermittent hematuria and has been off of eliquis since November.  Today, he denies symptoms of palpitations, chest pain, shortness of breath,  lower extremity edema, dizziness, presyncope, or syncope.  The patient is otherwise without complaint today.  The patient denies symptoms of fevers, chills, cough, or new SOB worrisome for COVID 19.  Past Medical History:  Diagnosis Date  . A-fib ([place])   . Arthritis   . Benign prostatic hyperplasia   . BPH (benign prostatic hypertrophy)   . Carpal tunnel syndrome, bilateral   . Early cataract   . History of SCC (squamous cell carcinoma) of skin 10/04/2016  . HNP (herniated nucleus pulposus), cervical   . Hyperlipidemia   . Hypertension 2007  . IBS (irritable bowel syndrome)   . Mild acid reflux occasional  . Obesity   . Other abnormal glucose   . Pneumonia    " walking"  . Pre-diabetes   . Vitamin D deficiency   . Wears glasses     Past Surgical History:  Procedure Laterality Date  . ANTERIOR CERVICAL DECOMP/DISCECTOMY FUSION   01-25-2002   C4 - C5  . ANTERIOR CERVICAL DECOMP/DISCECTOMY FUSION Bilateral 07/16/2018   Procedure: REMOVAL OF ANTERIOR CERVICAL PLATE, ANTERIOR CERVICAL DECOMPRESSION/DISCECTOMY FUSION CERVICAL THREE- CERVICAL FOUR;  Surgeon: Jovita Gamma, MD;  Location: [facility] [facility];  Service: Neurosurgery;  Laterality: Bilateral;  REMOVAL OF ANTERIOR CERVICAL PLATE, ANTERIOR CERVICAL DECOMPRESSION/DISCECTOMY FUSION CERVICAL THREE- CERVICAL FOUR  . ANTERIOR CRUCIATE LIGAMENT REPAIR  1990   RIGHT  . APPENDECTOMY  02-07-2007  . CARPAL TUNNEL RELEASE Right 07/16/2018   Procedure: CARPAL TUNNEL RELEASE;  Surgeon: Jovita Gamma, MD;  Location: [place];  Service: Neurosurgery;  Laterality: Right;  CARPAL TUNNEL RELEASE  . CERVICAL FUSION  2000   C5 - 6  . COLONOSCOPY  05-30-2011   HEMORRHOIDS  . HERNIA REPAIR    . HIP SURGERY  1999   LEFT HIP --  REMOVAL CALCIUM BUILD-UP  . LEFT ANKLE SURG.  1980  . LEFT INGUINAL HERNIA REPAIR  1970  . LEFT ROTATOR CUFF REPAIR  2002  . LOOP RECORDER INSERTION N/A 09/19/2016   Procedure: Loop Recorder Insertion;  Surgeon: Thompson Grayer, MD;  Location: [facility];  Service: Cardiovascular;  Laterality: N/A;  . TENDON RECONSTRUCTION Left 07/17/2013   Procedure: LEFT ELBOW LATERAL RECONSTRUCTION;  Surgeon: Schuyler Amor, MD;  Location: [place];  Service: Orthopedics;  Laterality: Left;  . TONSILLECTOMY    . TRIGGER FINGER RELEASE Right 07/17/2013   Procedure: RELEASE A-1 PULLEY RIGHT  RING FINGER;  Surgeon: Schuyler Amor, MD;  Location: [facility];  Service: Orthopedics;  Laterality: Right;  . UMBILICAL HERNIA REPAIR  2003  . XI ROBOTIC ASSISTED SIMPLE PROSTATECTOMY N/A 05/22/2019   Procedure: XI ROBOTIC ASSISTED SIMPLE PROSTATECTOMY;  Surgeon: Alexis Frock, MD;  [facility];  Service: Urology;  Laterality: N/A;  3 HRS    Current Outpatient Medications  Medication Sig Dispense Refill  . ALPRAZolam (XANAX) 1 MG tablet Take 1/2-1  tablet at Bedtime  ONLY if needed for Sleep &  limit to 5 days /week to avoid addiction (MUST LAST 90 DAYS) 90 tablet 0  . Carboxymethylcellulose Sodium (REFRESH TEARS OP) Place 1 drop into both eyes daily as needed (dry eyes).     . Cholecalciferol (VITAMIN D3) 5000 units CAPS Take 10,000 Units by mouth daily.     Marland Kitchen diltiazem (CARDIZEM) 30 MG tablet Take 1 tablet (30 mg total) by mouth 2 (two) times daily as needed (q6h PRN for palpitations.). 30 tablet 3  . finasteride (PROSCAR) 5 MG tablet Take 5 mg by mouth daily.    . Flaxseed, Linseed, (FLAX SEED OIL PO) Take 1 capsule by mouth daily.    . flecainide (TAMBOCOR) 100 MG tablet TAKE 1 TABLET BY MOUTH  TWICE DAILY KEEP SCHEDULED  APPOINTMENT WITH Amahd Morino FOR REFILLS 180 tablet 0  . losartan (COZAAR) 50 MG tablet Take 1 tablet Daily for BP 90 tablet 3  . Multiple Vitamin (MULTIVITAMIN) tablet Take 1 tablet by mouth daily.    . Omega-3 Fatty Acids (FISH OIL PO) Take 1 capsule by mouth 2 (two) times daily.    Marland Kitchen oxybutynin (DITROPAN) 5 MG tablet Take 5 mg by mouth every 8 (eight) hours as needed for bladder spasms.    Marland Kitchen oxyCODONE (OXY IR/ROXICODONE) 5 MG immediate release tablet Take 1-2 tablets (5-10 mg total) by mouth every 4 (four) hours as needed for moderate pain. 10 tablet 0  . rosuvastatin (CRESTOR) 20 MG tablet Take 1 tablet (20 mg total) by mouth daily. 90 tablet 3  . verapamil (CALAN-SR) 120 MG CR tablet Take 1 tablet Daily with food for BP & Heart Rhythm (Patient taking differently: Take 120 mg by mouth daily. ) 90 tablet 3  . vitamin C (ASCORBIC ACID) 500 MG tablet Take 500 mg by mouth daily.    . solifenacin (VESICARE) 10 MG tablet Take 10 mg by mouth daily.     No current facility-administered medications for this visit.    Allergies:   Xarelto [rivaroxaban], Atorvastatin, Clonazepam, and Tamsulosin hcl   Social History:  The patient  reports that he has never smoked. He has never used smokeless tobacco. He reports current alcohol  use of about 4.0 standard drinks of alcohol per week. He reports that he does not use drugs.   Family History:  The patient's family history includes Alcohol abuse in his father; Diabetes in his father; Heart attack (age of onset: 47) in his father; Heart disease in his father and mother; Stroke (age of onset: 75) in his mother.   ROS:  Please see the history of present illness.   All other systems are personally reviewed and negative.    Exam:    Vital Signs:  BP 122/86   Pulse 74   Ht 6' (1.829 m)   Wt 220 lb (99.8 kg)   BMI 29.84 kg/m   Well sounding, alert and conversant   Labs/Other Tests and Data Reviewed:    Recent Labs: 06/10/2019:  ALT 9; BUN 14; Creat 0.76; Hemoglobin 11.6; Magnesium 1.9; Platelets 436; Potassium 3.9; Sodium 140; TSH 0.46   Wt Readings from Last 3 Encounters:  07/22/19 220 lb (99.8 kg)  06/10/19 223 lb (101.2 kg)  05/22/19 232 lb 1 oz (105.3 kg)     Last device remote is reviewed from [facility] PDF which reveals normal device function, no recent arrhythmias   ASSESSMENT & PLAN:    1.  Paroxysmal atrial fibrillation/ atrial flutter He had afib this past summer but has done very well since ILR is reviewed and reveals no afib postoperatively.  He has been off of eliquis since November due to hematuria. His chads2vasc score is 2. Given ongoing hematuira and no afib by ILR, we will plan to keep him off of anticoagulation indefinitely.  We could reconsider anticoagulation if his AF burden increases.  He has a Chad mobile which he used to follow his AF also  2. HTN Stable No change required today   Follow-up:  Return to see me in 9 months   Patient Risk:  after full review of this patients clinical status, I feel that they are at moderate risk at this time.  Today, I have spent 15 minutes with the patient with telehealth technology discussing arrhythmia management .    Army Fossa, MD  07/22/2019 1:37 PM     [address] [place] [address] [phone] (office) [phone] (fax)

## 2019-08-08 ENCOUNTER — Ambulatory Visit: Payer: Medicare Other

## 2019-08-12 ENCOUNTER — Ambulatory Visit (INDEPENDENT_AMBULATORY_CARE_PROVIDER_SITE_OTHER): Payer: Medicare Other | Admitting: *Deleted

## 2019-08-12 DIAGNOSIS — I480 Paroxysmal atrial fibrillation: Secondary | ICD-10-CM

## 2019-08-12 LAB — CUP PACEART REMOTE DEVICE CHECK
Date Time Interrogation Session: 20210201000256
Implantable Pulse Generator Implant Date: 20180312

## 2019-08-13 NOTE — Progress Notes (Signed)
ILR Remote 

## 2019-08-15 ENCOUNTER — Telehealth (HOSPITAL_COMMUNITY): Payer: Self-pay | Admitting: *Deleted

## 2019-08-15 NOTE — Telephone Encounter (Signed)
Patient called in stating since Monday has been having issues with elevated BP to the point that he does not feel well today with some lightheadedness. BP 142-153/98-102 Only change in medication is he stopped vesicare on Monday.  Discussed with Roderic Palau NP -- will increase losartan to 50mg  a day (pt reports only taking 25mg  a day despite chart states 50mg ).  Follow up in 1 week for bp check/BMET. Pt in agreement.

## 2019-08-16 ENCOUNTER — Ambulatory Visit: Payer: Medicare Other

## 2019-08-19 ENCOUNTER — Ambulatory Visit: Payer: Medicare Other

## 2019-08-27 ENCOUNTER — Ambulatory Visit (HOSPITAL_COMMUNITY)
Admission: RE | Admit: 2019-08-27 | Discharge: 2019-08-27 | Disposition: A | Discharge status: Home / Self Care | Payer: Medicare Other | Source: Ambulatory Visit | Attending: Nurse Practitioner | Admitting: Nurse Practitioner

## 2019-08-27 ENCOUNTER — Other Ambulatory Visit: Payer: Self-pay

## 2019-08-27 VITALS — BP 146/84 | Wt 228.4 lb

## 2019-08-27 DIAGNOSIS — I10 Essential (primary) hypertension: Secondary | ICD-10-CM | POA: Diagnosis not present

## 2019-08-27 DIAGNOSIS — Z79899 Other long term (current) drug therapy: Secondary | ICD-10-CM | POA: Diagnosis not present

## 2019-08-27 LAB — BASIC METABOLIC PANEL
Anion gap: 9 (ref 5–15)
BUN: 12 mg/dL (ref 8–23)
CO2: 25 mmol/L (ref 22–32)
Calcium: 9.8 mg/dL (ref 8.9–10.3)
Chloride: 105 mmol/L (ref 98–111)
Creatinine, Ser: 0.87 mg/dL (ref 0.61–1.24)
GFR calc Af Amer: >60 mL/min (ref >60)
GFR calc non Af Amer: >60 mL/min (ref >60)
Glucose, Bld: 93 mg/dL (ref 70–99)
Potassium: 4.4 mmol/L (ref 3.5–5.1)
Sodium: 139 mmol/L (ref 135–145)

## 2019-08-27 NOTE — Progress Notes (Signed)
Pt in for nurse check  f/u after Losartan was increased to 50 mg daily after he noted increase of BP last week.  Today his BP is  146/84. Bmet is drawn with increase in ARB. I looked over BP readings at home and the majority were well controlled. No further change today.

## 2019-09-02 DIAGNOSIS — R3915 Urgency of urination: Secondary | ICD-10-CM | POA: Diagnosis not present

## 2019-09-02 DIAGNOSIS — R310 Gross hematuria: Secondary | ICD-10-CM | POA: Diagnosis not present

## 2019-09-02 DIAGNOSIS — R338 Other retention of urine: Secondary | ICD-10-CM | POA: Diagnosis not present

## 2019-09-11 NOTE — Progress Notes (Signed)
FOLLOW UP  Assessment and Plan:  Dizziness Mainly with standing or lying down- component of vertigo Normal neuro ? From medications, monitor HR and BP Wear compression socks  Essential hypertension -     CBC with Differential/Platelet -     TSH - continue medications, DASH diet, exercise and monitor at home. Call if greater than 130/80- goal is 120  Hyperlipidemia, mixed -     Lipid panel -     Liver Profile check lipids- goal is less than 70 decrease fatty foods increase activity.   Paroxysmal atrial fibrillation (HCC) Continue to follow up [facility]  Aortic atherosclerosis (HCC) Control blood pressure, cholesterol, glucose, increase exercise.   Abnormal glucose -     Hemoglobin A1c Discussed disease progression and risks Discussed diet/exercise, weight management and risk modification  Vitamin D deficiency -     defer  Medication management -     Magnesium   Continue diet and meds as discussed. Further disposition pending results of labs. Discussed med's effects and SE's.   Over 30 minutes of exam, counseling, chart review, and critical decision making was performed.   Future Appointments  Date Time Provider [place]  10/14/2019  9:15 AM [facility] DEVICE REMOTES [facility]  11/14/2019  8:40 AM [facility] DEVICE REMOTES [facility]  12/16/2019  8:40 AM [facility] DEVICE REMOTES [facility]  12/17/2019  2:30 PM Unk Pinto, MD [facility] None  01/16/2020  8:40 AM [facility] DEVICE REMOTES [facility]  02/17/2020  9:00 AM [facility] DEVICE REMOTES [facility]  03/19/2020  8:40 AM [facility] DEVICE REMOTES [facility]  04/20/2020  8:40 AM [facility] DEVICE REMOTES [facility]  05/04/2020  1:45 PM Allred, Jeneen Rinks, MD [facility]  05/21/2020  8:50 AM [facility] DEVICE REMOTES [facility]  06/22/2020  8:40 AM [facility] DEVICE REMOTES  [facility]  07/09/2020  3:00 PM Unk Pinto, MD [facility] None    ----------------------------------------------------------------------------------------------------------------------  HPI 74 y.o. male  presents for 3 month follow up on hypertension, cholesterol, glucose management, weight and vitamin D deficiency.   He is complaining of vertigo last few weeks with lying down and when he stands quickly he will feel light headed. Has had a headache in the morning for last week. He has been stressed with his wife breaking his foot and fiances paying for his MIL in a SNF. No sleep apnea symptoms. Neck pain but has had surgeries.   He is s/p robotic prosectomy with Dr. Tresa Moore, was put on oxybutrnin Patient was diagnosed with pAfib attributed to Decongestants for an Upper/lower Respiratory infection, followed by Dr Rayann Heman, has loop recorder, on elequis for CHADSVASC 2, controlled on flecainide. Had BMP 02/28 at cardio- has been off eliquis since he has been in [facility] and being monitored closely with home monitoring.   He has right knee pain, taking tylenol arthritis 4 x a day.   he has a diagnosis of insomnia and is currently on xanax 0.5 mg at night, reports symptoms are well controlled on current regimen. he is taking most nights since surgery.   BMI is Body mass index is 30.52 kg/m., he has been working on diet and exercise. Wt Readings from Last 3 Encounters:  09/12/19 225 lb (102.1 kg)  08/27/19 228 lb 6.4 oz (103.6 kg)  07/22/19 220 lb (99.8 kg)   His blood pressure has been controlled at home, today their BP is BP: 122/80  He does workout. He denies chest pain, shortness of breath, dizziness.   He is on cholesterol medication  Rosuvastatin 20 mg daily and denies myalgias. His cholesterol is at goal. The cholesterol last visit was:   Lab Results  Component Value Date   CHOL 122 06/10/2019   HDL 36 (L) 06/10/2019   LDLCALC 64 06/10/2019   TRIG 135 06/10/2019    CHOLHDL 3.4 06/10/2019    He has been working on diet and exercise for glucose management, and denies increased appetite, nausea, paresthesia of the feet, polydipsia and polyuria. Last A1C in the office was:  Lab Results  Component Value Date   HGBA1C 4.9 06/10/2019   Patient is on Vitamin D supplement.   Lab Results  Component Value Date   VD25OH 45 06/10/2019        Current Medications:  Current Outpatient Medications on File Prior to Visit  Medication Sig  . ALPRAZolam (XANAX) 1 MG tablet Take 1/2-1 tablet at Bedtime  ONLY if needed for Sleep &  limit to 5 days /week to avoid addiction (MUST LAST 90 DAYS) (Patient taking differently: Take 1/2-  tablet at Bedtime  ONLY if needed for Sleep &  limit to 5 days /week to avoid addiction (MUST LAST 90 DAYS))  . Carboxymethylcellulose Sodium (REFRESH TEARS OP) Place 1 drop into both eyes daily as needed (dry eyes).   . Cholecalciferol (VITAMIN D3) 5000 units CAPS Take 10,000 Units by mouth daily.   . finasteride (PROSCAR) 5 MG tablet Take 5 mg by mouth daily.  . Flaxseed, Linseed, (FLAX SEED OIL PO) Take 1 capsule by mouth daily.  . flecainide (TAMBOCOR) 100 MG tablet TAKE 1 TABLET BY MOUTH  TWICE DAILY KEEP SCHEDULED  APPOINTMENT WITH [facility] FOR REFILLS  . losartan (COZAAR) 50 MG tablet Take 1 tablet Daily for BP  . Multiple Vitamin (MULTIVITAMIN) tablet Take 1 tablet by mouth daily.  . Omega-3 Fatty Acids (FISH OIL PO) Take 1 capsule by mouth 2 (two) times daily.  . rosuvastatin (CRESTOR) 20 MG tablet Take 1 tablet (20 mg total) by mouth daily. (Patient taking differently: Take 10 mg by mouth daily. Taking 1/2 tablet daily)  . verapamil (CALAN-SR) 120 MG CR tablet Take 1 tablet Daily with food for BP & Heart Rhythm (Patient taking differently: Take 120 mg by mouth daily. )  . vitamin C (ASCORBIC ACID) 500 MG tablet Take 500 mg by mouth daily.  Marland Kitchen zinc gluconate 50 MG tablet Take 50 mg by mouth daily.   No current facility-administered  medications on file prior to visit.     Allergies:  Allergies  Allergen Reactions  . Xarelto [Rivaroxaban] Other (See Comments)    Severe body aches  . Atorvastatin Other (See Comments)    Myalgias  . Clonazepam Other (See Comments)    "Made me jittery"  . Tamsulosin Hcl Other (See Comments)    Hypotension     Medical History:  Past Medical History:  Diagnosis Date  . A-fib ([place])   . Arthritis   . Benign prostatic hyperplasia   . BPH (benign prostatic hypertrophy)   . Carpal tunnel syndrome, bilateral   . Early cataract   . History of SCC (squamous cell carcinoma) of skin 10/04/2016  . HNP (herniated nucleus pulposus), cervical   . Hyperlipidemia   . Hypertension 2007  . IBS (irritable bowel syndrome)   . Mild acid reflux occasional  . Obesity   . Other abnormal glucose   . Pneumonia    " walking"  . Pre-diabetes   . Vitamin D deficiency   . Wears glasses  Family history- Reviewed and unchanged Social history- Reviewed and unchanged   Review of Systems:  Review of Systems  Constitutional: Negative for malaise/fatigue and weight loss.  HENT: Negative for hearing loss, sore throat and tinnitus.        Dysphagia  Eyes: Negative for blurred vision and double vision.  Respiratory: Negative for cough, shortness of breath and wheezing.   Cardiovascular: Negative for chest pain, palpitations, orthopnea, claudication and leg swelling.  Gastrointestinal: Negative for abdominal pain, blood in stool, constipation, diarrhea, heartburn, melena, nausea and vomiting.  Genitourinary: Negative.   Musculoskeletal: Negative for joint pain and myalgias.  Skin: Negative for rash.  Neurological: Positive for dizziness (with standing quickly and lying down for several weeks). Negative for tingling, sensory change, weakness and headaches.  Endo/Heme/Allergies: Negative for polydipsia.  Psychiatric/Behavioral: Negative.   All other systems reviewed and are  negative.     Physical Exam: BP 122/80   Pulse 62   Temp (!) 97.5 F (36.4 C)   Ht 6' (1.829 m)   Wt 225 lb (102.1 kg)   SpO2 96%   BMI 30.52 kg/m  Wt Readings from Last 3 Encounters:  09/12/19 225 lb (102.1 kg)  08/27/19 228 lb 6.4 oz (103.6 kg)  07/22/19 220 lb (99.8 kg)   General Appearance: Well nourished, in no apparent distress. Eyes: PERRLA, EOMs, conjunctiva no swelling or erythema Sinuses: No Frontal/maxillary tenderness ENT/Mouth: Ext aud canals clear, TMs without erythema, bulging. No erythema, swelling, or exudate on post pharynx.  Tonsils not swollen or erythematous. Hearing normal.  Neck: Supple, thyroid normal. Surgical scar healing well.  Respiratory: Respiratory effort normal, BS equal bilaterally without rales, rhonchi, wheezing or stridor.  Cardio: RRR with no MRGs. Brisk peripheral pulses without edema.  Abdomen: Soft, + BS.  Non tender, no guarding, rebound, hernias, masses. Lymphatics: Non tender without lymphadenopathy.  Musculoskeletal: Full ROM, 5/5 strength, Normal gait Skin: Warm, dry without rashes, lesions, ecchymosis.  Neuro: Cranial nerves intact. No cerebellar symptoms.  Psych: Awake and oriented X 3, normal affect, Insight and Judgment appropriate.    Vicie Mutters, PA-C 12:04 PM [facility]

## 2019-09-12 ENCOUNTER — Other Ambulatory Visit: Payer: Self-pay

## 2019-09-12 ENCOUNTER — Encounter: Payer: Self-pay | Admitting: Physician Assistant

## 2019-09-12 ENCOUNTER — Ambulatory Visit (INDEPENDENT_AMBULATORY_CARE_PROVIDER_SITE_OTHER): Payer: Medicare Other | Admitting: *Deleted

## 2019-09-12 ENCOUNTER — Ambulatory Visit (INDEPENDENT_AMBULATORY_CARE_PROVIDER_SITE_OTHER): Payer: Medicare Other | Admitting: Physician Assistant

## 2019-09-12 VITALS — BP 122/80 | HR 62 | Temp 97.5°F | Ht 72.0 in | Wt 225.0 lb

## 2019-09-12 DIAGNOSIS — I700 Atherosclerosis of aorta: Secondary | ICD-10-CM

## 2019-09-12 DIAGNOSIS — Z79899 Other long term (current) drug therapy: Secondary | ICD-10-CM

## 2019-09-12 DIAGNOSIS — R42 Dizziness and giddiness: Secondary | ICD-10-CM

## 2019-09-12 DIAGNOSIS — I10 Essential (primary) hypertension: Secondary | ICD-10-CM

## 2019-09-12 DIAGNOSIS — I480 Paroxysmal atrial fibrillation: Secondary | ICD-10-CM | POA: Diagnosis not present

## 2019-09-12 DIAGNOSIS — E782 Mixed hyperlipidemia: Secondary | ICD-10-CM | POA: Diagnosis not present

## 2019-09-12 DIAGNOSIS — R7309 Other abnormal glucose: Secondary | ICD-10-CM

## 2019-09-12 DIAGNOSIS — E559 Vitamin D deficiency, unspecified: Secondary | ICD-10-CM | POA: Diagnosis not present

## 2019-09-12 LAB — CUP PACEART REMOTE DEVICE CHECK
Date Time Interrogation Session: 20210304025136
Implantable Pulse Generator Implant Date: 20180312

## 2019-09-12 NOTE — Progress Notes (Signed)
ILR Remote 

## 2019-09-12 NOTE — Patient Instructions (Addendum)
FIBER SUPPLEMENT  Benefiber or Citracel is good for constipation/diarrhea/irritable bowel syndrome, it helps with weight loss and can help lower your bad cholesterol. Please do 1 TBSP in the morning in water, coffee, or tea. It can take up to a month before you can see a difference with your bowel movements. It is cheapest from [employer], [employer], [employer].   Can also do miralax daily for 1-2 weeks while taking the fiber supplement   HYPERTENSION INFORMATION  Monitor your blood pressure at home, please keep a record and bring that in with you to your next office visit.   Go to the ER if any CP, SOB, nausea, dizziness, severe HA, changes vision/speech  Testing/Procedures: HOW TO TAKE YOUR BLOOD PRESSURE:  Rest 5 minutes before taking your blood pressure.  Don't smoke or drink caffeinated beverages for at least 30 minutes before.  Take your blood pressure before (not after) you eat.  Sit comfortably with your back supported and both feet on the floor (don't cross your legs).  Elevate your arm to heart level on a table or a desk.  Use the proper sized cuff. It should fit smoothly and snugly around your bare upper arm. There should be enough room to slip a fingertip under the cuff. The bottom edge of the cuff should be 1 inch above the crease of the elbow.  Due to a recent study, SPRINT, we have changed our goal for the systolic or top blood pressure number. Ideally we want your top number at 120.  In the [facility] Trial, 5000 people were randomized to a goal BP of 120 and 5000 people were randomized to a goal BP of less than 140. The patients with the goal BP at 120 had LESS DEMENTIA, LESS HEART ATTACKS, AND LESS STROKES, AS WELL AS OVERALL DECREASED MORTALITY OR DEATH RATE.   There was another study that showed taking your blood pressure medications at night decrease cardiovascular events.  However if you are on a fluid pill, please take this in the morning.   If you are willing, our goal BP is  the top number of 120.  Your most recent BP: BP: 122/80   Take your medications faithfully as instructed. Maintain a healthy weight. Get at least 150 minutes of aerobic exercise per week. Minimize salt intake. Minimize alcohol intake  DASH Eating Plan DASH stands for "Dietary Approaches to Stop Hypertension." The DASH eating plan is a healthy eating plan that has been shown to reduce high blood pressure (hypertension). Additional health benefits may include reducing the risk of type 2 diabetes mellitus, heart disease, and stroke. The DASH eating plan may also help with weight loss. WHAT DO I NEED TO KNOW ABOUT THE DASH EATING PLAN? For the DASH eating plan, you will follow these general guidelines:  Choose foods with a percent daily value for sodium of less than 5% (as listed on the food label).  Use salt-free seasonings or herbs instead of table salt or sea salt.  Check with your health care provider or pharmacist before using salt substitutes.  Eat lower-sodium products, often labeled as "lower sodium" or "no salt added."  Eat fresh foods.  Eat more vegetables, fruits, and low-fat dairy products.  Choose whole grains. Look for the word "whole" as the first word in the ingredient list.  Choose fish and skinless chicken or [place] more often than red meat. Limit fish, poultry, and meat to 6 oz (170 g) each day.  Limit sweets, desserts, sugars, and sugary drinks.  Choose heart-healthy fats.  Limit cheese to 1 oz (28 g) per day.  Eat more home-cooked food and less restaurant, buffet, and fast food.  Limit fried foods.  Cook foods using methods other than frying.  Limit canned vegetables. If you do use them, rinse them well to decrease the sodium.  When eating at a restaurant, ask that your food be prepared with less salt, or no salt if possible. WHAT FOODS CAN I EAT? Seek help from a dietitian for individual calorie needs. Grains Whole grain or whole wheat bread. Brown  rice. Whole grain or whole wheat pasta. Quinoa, bulgur, and whole grain cereals. Low-sodium cereals. Corn or whole wheat flour tortillas. Whole grain cornbread. Whole grain crackers. Low-sodium crackers. Vegetables Fresh or frozen vegetables (raw, steamed, roasted, or grilled). Low-sodium or reduced-sodium tomato and vegetable juices. Low-sodium or reduced-sodium tomato sauce and paste. Low-sodium or reduced-sodium canned vegetables.  Fruits All fresh, canned (in natural juice), or frozen fruits. Meat and Other Protein Products Ground beef (85% or leaner), grass-fed beef, or beef trimmed of fat. Skinless chicken or [place]. Ground chicken or [place]. Pork trimmed of fat. All fish and seafood. Eggs. Dried beans, peas, or lentils. Unsalted nuts and seeds. Unsalted canned beans. Dairy Low-fat dairy products, such as skim or 1% milk, 2% or reduced-fat cheeses, low-fat ricotta or cottage cheese, or plain low-fat yogurt. Low-sodium or reduced-sodium cheeses. Fats and Oils Tub margarines without trans fats. Light or reduced-fat mayonnaise and salad dressings (reduced sodium). Avocado. Safflower, olive, or canola oils. Natural peanut or almond butter. Other Unsalted popcorn and pretzels. The items listed above may not be a complete list of recommended foods or beverages. Contact your dietitian for more options. WHAT FOODS ARE NOT RECOMMENDED? Grains White bread. White pasta. White rice. Refined cornbread. Bagels and croissants. Crackers that contain trans fat. Vegetables Creamed or fried vegetables. Vegetables in a cheese sauce. Regular canned vegetables. Regular canned tomato sauce and paste. Regular tomato and vegetable juices. Fruits Dried fruits. Canned fruit in light or heavy syrup. Fruit juice. Meat and Other Protein Products Fatty cuts of meat. Ribs, chicken wings, bacon, sausage, bologna, salami, chitterlings, fatback, hot dogs, bratwurst, and packaged luncheon meats. Salted nuts and seeds.  Canned beans with salt. Dairy Whole or 2% milk, cream, half-and-half, and cream cheese. Whole-fat or sweetened yogurt. Full-fat cheeses or blue cheese. Nondairy creamers and whipped toppings. Processed cheese, cheese spreads, or cheese curds. Condiments Onion and garlic salt, seasoned salt, table salt, and sea salt. Canned and packaged gravies. Worcestershire sauce. Tartar sauce. Barbecue sauce. Teriyaki sauce. Soy sauce, including reduced sodium. Steak sauce. Fish sauce. Oyster sauce. Cocktail sauce. Horseradish. Ketchup and mustard. Meat flavorings and tenderizers. Bouillon cubes. Hot sauce. Tabasco sauce. Marinades. Taco seasonings. Relishes. Fats and Oils Butter, stick margarine, lard, shortening, ghee, and bacon fat. Coconut, palm kernel, or palm oils. Regular salad dressings. Other Pickles and olives. Salted popcorn and pretzels. The items listed above may not be a complete list of foods and beverages to avoid. Contact your dietitian for more information. WHERE CAN I FIND MORE INFORMATION? National Heart, Lung, and Blood Institute: travelstabloid.com Document Released: 06/16/2011 Document Revised: 11/11/2013 Document Reviewed: 05/01/2013 [facility] Patient Information 2015 [place], [place]. This information is not intended to replace advice given to you by your health care provider. Make sure you discuss any questions you have with your health care provider.  Benign Paroxysmal Positional Vertigo (BPPV)  General Information In Benign Paroxysmal Positional Vertigo (BPPV) dizziness is generally thought to be due  to debris which has collected within a part of the inner ear. This debris can be thought of as "ear rocks", although the formal name is "otoconia". Ear rocks are small crystals of calcium carbonate derived from a structure in the ear called the "utricle" (figure to the right ). The symptoms of BPPV include dizziness or vertigo, lightheadedness, imbalance,  and nausea. Activities which bring on symptoms will vary among persons, but symptoms are usually followed by a change of position of the head like getting out of bed or rolling over in bed are common "problem" motions .  However if you have worsening HA, changes vision/speech, weakness go to the ER     What can be done? 1) Use two or more pillows at night. Avoid sleeping on the "bad" side. In the morning, get up slowly and sit on the edge of the bed for a minute. 2) Medication prescribed by your doctor.  3) The exercises below, you can do at home to help you prevent the sensation later in the day.  4) We can refer you to physical therapy at [place] [facility], # [place] treatments: The Brandt-Daroff Exercises are a home method of treating BPPV,and are effective 95% of the time.  These exercises are performed in three sets per day for two weeks. In each set, one performs the maneuver as shown five times. Start sitting upright (position 1). Then move into the side-lying position (position 2), with the head angled upward about halfway. An easy way to remember this is to imagine someone standing about 6 feet in front of you, and just keep looking at their head at all times. Stay in the side-lying position for 30 seconds, or until the dizziness subsides if this is longer, then go back to the sitting position (position 3). Stay there for 30 seconds, and then go to the opposite side (position 4) and follow the same routine.  At home Epley Maneuver This procedure seems to be even more effective than the in-office procedure, perhaps because it is repeated every night for a week.  The method (for the left side) is performed as shown on the figure below.  1) One stays in each of the supine (lying down) positions for 30 seconds, and in the sitting upright position (top) for 1 minute.  2) Thus, once cycle takes 2 1/2 minutes.  3) Typically 3 cycles are performed just prior to going to  sleep.  4) It is best to do them at night rather than in the morning or midday, as if one becomes dizzy following the exercises, then it can resolve while one is sleeping.  The mirror image of this procedure is used for the right ear.  Cervicogenic Headache  A cervicogenic headache is a headache caused by a condition that affects the bones and tissues in your neck (cervical spine). In a cervicogenic headache, the pain moves from your neck to your head. Most cervicogenic headaches start in the upper part of the neck with the first three cervical bones (cervical vertebrae). A cervicogenic headache is diagnosed when a cause can be found in the cervical spine and other causes of headaches can be ruled out. What are the causes? The most common cause of this condition is a traumatic injury to the cervical spine, such as whiplash. Other causes include:  Arthritis.  Broken bone (fracture).  Infection.  Tumor. What are the signs or symptoms? The most common symptoms are neck and head pain. The  pain is often located on one side. In some cases, there may be head pain without neck pain. Pain may be felt in the neck, back or side of the head, face, or behind the eyes. Other symptoms include:  Limited movement in the neck.  Arm or shoulder pain. How is this diagnosed? This condition may be diagnosed based on:  Your symptoms.  A physical exam.  An injection that blocks nerve signals (nerve block).  Imaging tests, such as: ? X-rays. ? CT scan. ? MRI. How is this treated? Treatment for this condition may depend on the underlying condition. Treatment may include:  Medicines, such as: ? NSAIDs. ? Muscle relaxants.  Physical therapy.  Massage therapy.  Complementary therapies, such as: ? Biofeedback. ? Meditation. ? Acupuncture.  Nerve block injections.  Botulinum toxin injections. Your treatment plan may involve working with a pain management team that includes your primary  health care provider, a pain management specialist, a neurologist, and a physical therapist. Follow these instructions at home:  Take over-the-counter and prescription medicines only as told by your health care provider.  Do exercises at home as told by your physical therapist.  Return to your normal activities as told by your health care provider. Ask your health care provider what activities are safe for you. Avoid activities that trigger your headaches.  Maintain good neck support and posture at home and at work.  Keep all follow-up visits as told by your health care provider. This is important. Contact a health care provider if you have:  Headaches that are getting worse and happening more often.  Headaches with any of the following: ? Fever. ? Numbness. ? Weakness. ? Dizziness. ? Nausea or vomiting. Get help right away if:  You have a very sudden and severe headache. Summary  A cervicogenic headache is a headache caused by a condition that affects the bones and tissues in your cervical spine.  Your health care provider may diagnose this condition with a physical exam, a nerve block, and imaging tests.  Treatment may include medicine to reduce pain and inflammation, physical therapy, and nerve block injections.  Complementary therapies, such as acupuncture and meditation, may be added to other treatments.  Your treatment plan may involve working with a pain management team that includes your primary health care provider, a pain management specialist, a neurologist, and a physical therapist. This information is not intended to replace advice given to you by your health care provider. Make sure you discuss any questions you have with your health care provider. Document Revised: 10/17/2018 Document Reviewed: 07/07/2017 Elsevier Patient Education  [place].

## 2019-09-13 LAB — VITAMIN D 25 HYDROXY (VIT D DEFICIENCY, FRACTURES): Vit D, 25-Hydroxy: 82 ng/mL (ref 30–100)

## 2019-09-13 LAB — CBC WITH DIFFERENTIAL/PLATELET
Absolute Monocytes: 873 cells/uL (ref 200–950)
Basophils Absolute: 59 cells/uL (ref 0–200)
Basophils Relative: 0.8 %
Eosinophils Absolute: 207 cells/uL (ref 15–500)
Eosinophils Relative: 2.8 %
HCT: 48.7 % (ref 38.5–50.0)
Hemoglobin: 16.2 g/dL (ref 13.2–17.1)
Lymphs Abs: 1621 cells/uL (ref 850–3900)
MCH: 29.9 pg (ref 27.0–33.0)
MCHC: 33.3 g/dL (ref 32.0–36.0)
MCV: 90 fL (ref 80.0–100.0)
MPV: 10 fL (ref 7.5–12.5)
Monocytes Relative: 11.8 %
Neutro Abs: 4640 cells/uL (ref 1500–7800)
Neutrophils Relative %: 62.7 %
Platelets: 212 10*3/uL (ref 140–400)
RBC: 5.41 10*6/uL (ref 4.20–5.80)
RDW: 13.2 % (ref 11.0–15.0)
Total Lymphocyte: 21.9 %
WBC: 7.4 10*3/uL (ref 3.8–10.8)

## 2019-09-13 LAB — HEPATIC FUNCTION PANEL
AG Ratio: 1.6 (calc) (ref 1.0–2.5)
ALT: 25 U/L (ref 9–46)
AST: 22 U/L (ref 10–35)
Albumin: 4.4 g/dL (ref 3.6–5.1)
Alkaline phosphatase (APISO): 86 U/L (ref 35–144)
Bilirubin, Direct: 0.1 mg/dL (ref 0.0–0.2)
Globulin: 2.7 g/dL (calc) (ref 1.9–3.7)
Indirect Bilirubin: 0.2 mg/dL (calc) (ref 0.2–1.2)
Total Bilirubin: 0.3 mg/dL (ref 0.2–1.2)
Total Protein: 7.1 g/dL (ref 6.1–8.1)

## 2019-09-13 LAB — HEMOGLOBIN A1C
Hgb A1c MFr Bld: 5.6 % of total Hgb (ref <5.7)
Mean Plasma Glucose: 114 (calc)
eAG (mmol/L): 6.3 (calc)

## 2019-09-13 LAB — LIPID PANEL
Cholesterol: 145 mg/dL (ref <200)
HDL: 46 mg/dL (ref >=40)
LDL Cholesterol (Calc): 80 mg/dL (calc)
Non-HDL Cholesterol (Calc): 99 mg/dL (calc) (ref <130)
Total CHOL/HDL Ratio: 3.2 (calc) (ref <5.0)
Triglycerides: 111 mg/dL (ref <150)

## 2019-09-13 LAB — TSH: TSH: 0.89 mIU/L (ref 0.40–4.50)

## 2019-09-13 LAB — MAGNESIUM: Magnesium: 2.2 mg/dL (ref 1.5–2.5)

## 2019-09-17 ENCOUNTER — Encounter (HOSPITAL_COMMUNITY): Payer: Self-pay

## 2019-09-17 ENCOUNTER — Telehealth (HOSPITAL_COMMUNITY): Payer: Self-pay | Admitting: *Deleted

## 2019-09-17 NOTE — Telephone Encounter (Signed)
Patient called in asking if he should be taking a baby aspirin since he is now off of Eliquis. He has been off eliquis since October due to low AF burden and Chads2Vasc score of 2.

## 2019-09-18 NOTE — Telephone Encounter (Signed)
Patient notified of recommendations that Aspirin is not needed.

## 2019-09-19 DIAGNOSIS — D1801 Hemangioma of skin and subcutaneous tissue: Secondary | ICD-10-CM | POA: Diagnosis not present

## 2019-09-19 DIAGNOSIS — L820 Inflamed seborrheic keratosis: Secondary | ICD-10-CM | POA: Diagnosis not present

## 2019-09-19 DIAGNOSIS — L821 Other seborrheic keratosis: Secondary | ICD-10-CM | POA: Diagnosis not present

## 2019-09-19 DIAGNOSIS — D225 Melanocytic nevi of trunk: Secondary | ICD-10-CM | POA: Diagnosis not present

## 2019-09-19 DIAGNOSIS — L570 Actinic keratosis: Secondary | ICD-10-CM | POA: Diagnosis not present

## 2019-09-23 DIAGNOSIS — H52203 Unspecified astigmatism, bilateral: Secondary | ICD-10-CM | POA: Diagnosis not present

## 2019-09-23 DIAGNOSIS — H2513 Age-related nuclear cataract, bilateral: Secondary | ICD-10-CM | POA: Diagnosis not present

## 2019-10-01 DIAGNOSIS — M170 Bilateral primary osteoarthritis of knee: Secondary | ICD-10-CM | POA: Diagnosis not present

## 2019-10-02 NOTE — Progress Notes (Signed)
Subjective:    Patient ID: [name], male    DOB: [birth year], 74 y.o.   MRN: [id number]  HPI 74 y.o. WM with history of HTN, preDM, afib, aortic atherosclerosis presents with headache.    He has been complaining of vertigo with lying down, standing quickly for 1 month, with HA x 3 weeks, frontal HA, no light or sound sensitivity. He has had nausea, no vomiting, nausea is coming and going.  Was seen 03/04 with normal neuro.   He had moderna shot on 03/01.  He is s/p prosectomy with Dr. Tresa Moore. He is not on elequis for Afib at this time due to rhythm control with flecainde and low afib burden, has loop recorder.  Had normal eye exam on the April 15th.  He is taking 2 tylenol 3 x a day, mother died from cerebral hemorrhage so he is concerned,  He denies any associated neurological complications or symptoms, such as one-sided weakness, numbness, tingling, slurring of speech, droopy face, swallowing difficulties, diplopia, vision loss, hearing loss or tinnitus.     He has been stressed with his wife breaking his foot and fiances paying for his MIL in a SNF.  No sleep apnea symptoms. Neck pain but has had surgeries. Denies weakness in his arms, trouble with grip, worse headache ever, fever, chills.   Never had CT/MRI of brain.   Blood pressure 118/76, pulse 68, temperature (!) 97.3 F (36.3 C), weight 228 lb (103.4 kg), SpO2 98 %.  Medications   Current Outpatient Medications (Cardiovascular):  .  flecainide (TAMBOCOR) 100 MG tablet, TAKE 1 TABLET BY MOUTH  TWICE DAILY KEEP SCHEDULED  APPOINTMENT WITH [facility] FOR REFILLS .  losartan (COZAAR) 50 MG tablet, Take 1 tablet Daily for BP .  rosuvastatin (CRESTOR) 20 MG tablet, Take 1 tablet (20 mg total) by mouth daily. (Patient taking differently: Take 10 mg by mouth daily. Taking 1/2 tablet daily) .  verapamil (CALAN-SR) 120 MG CR tablet, Take 1 tablet Daily with food for BP & Heart Rhythm (Patient taking differently: Take 120 mg by  mouth daily. )     Current Outpatient Medications (Other):  Marland Kitchen  ALPRAZolam (XANAX) 1 MG tablet, Take 1/2-1 tablet at Bedtime  ONLY if needed for Sleep &  limit to 5 days /week to avoid addiction (MUST LAST 90 DAYS) (Patient taking differently: Take 1/2-  tablet at Bedtime  ONLY if needed for Sleep &  limit to 5 days /week to avoid addiction (MUST LAST 90 DAYS)) .  Carboxymethylcellulose Sodium (REFRESH TEARS OP), Place 1 drop into both eyes daily as needed (dry eyes).  .  Cholecalciferol (VITAMIN D3) 5000 units CAPS, Take 10,000 Units by mouth daily.  .  finasteride (PROSCAR) 5 MG tablet, Take 5 mg by mouth daily. .  Flaxseed, Linseed, (FLAX SEED OIL PO), Take 1 capsule by mouth daily. .  Multiple Vitamin (MULTIVITAMIN) tablet, Take 1 tablet by mouth daily. .  Omega-3 Fatty Acids (FISH OIL PO), Take 1 capsule by mouth 2 (two) times daily. .  vitamin C (ASCORBIC ACID) 500 MG tablet, Take 500 mg by mouth daily. Marland Kitchen  zinc gluconate 50 MG tablet, Take 50 mg by mouth daily.  Problem list He has Essential hypertension; Hyperlipidemia, mixed; BPH with obstruction/lower urinary tract symptoms; Abnormal glucose; IBS (irritable bowel syndrome); Vitamin D deficiency; Obesity (BMI 30.0-34.9); Atrial fibrillation ([place]); History of SCC (squamous cell carcinoma) of skin; Aortic atherosclerosis ([place]); HNP (herniated nucleus pulposus), cervical; Insomnia; Arthritis of carpometacarpal (CMC) joint  of left thumb; Chronic pain of left thumb; and BPH (benign prostatic hyperplasia) on their problem list.   Review of Systems  Constitutional: Negative.   HENT: Positive for sore throat and voice change. Negative for congestion, dental problem, drooling, ear discharge, ear pain, facial swelling, hearing loss, postnasal drip, rhinorrhea, sinus pressure, sinus pain, sneezing, tinnitus and trouble swallowing.   Eyes: Negative.  Negative for photophobia and visual disturbance.  Respiratory: Negative.  Negative for cough.    Cardiovascular: Negative.  Negative for chest pain, palpitations and leg swelling.  Gastrointestinal: Positive for nausea. Negative for abdominal distention, abdominal pain, anal bleeding, blood in stool, constipation, diarrhea, rectal pain and vomiting.  Genitourinary: Negative.   Musculoskeletal: Negative.   Neurological: Positive for dizziness and headaches. Negative for tremors, seizures, syncope, facial asymmetry, speech difficulty, weakness, light-headedness and numbness.  Hematological: Negative.   Psychiatric/Behavioral: Negative.        Objective:   Physical Exam Constitutional:      Appearance: He is well-developed. He is not ill-appearing.  HENT:     Head: Normocephalic and atraumatic.     Mouth/Throat:     Comments: + TMJ tenderness bilaterally Eyes:     General: No visual field deficit.    Extraocular Movements: Extraocular movements intact.     Right eye: Normal extraocular motion.     Left eye: Normal extraocular motion.     Pupils: Pupils are equal, round, and reactive to light.  Cardiovascular:     Rate and Rhythm: Normal rate and regular rhythm.     Heart sounds: Normal heart sounds.  Pulmonary:     Effort: Pulmonary effort is normal.     Breath sounds: Normal breath sounds.  Abdominal:     General: Bowel sounds are normal.     Palpations: Abdomen is soft.     Tenderness: There is no abdominal tenderness.  Musculoskeletal:        General: Normal range of motion.     Cervical back: Normal range of motion and neck supple.  Skin:    General: Skin is warm and dry.     Findings: No rash.  Neurological:     Mental Status: He is alert and oriented to person, place, and time.     Cranial Nerves: No cranial nerve deficit, dysarthria or facial asymmetry.     Sensory: No sensory deficit.     Motor: No weakness.     Coordination: Romberg sign negative. Coordination normal.     Gait: Gait normal.     Deep Tendon Reflexes: Reflexes normal.  Psychiatric:         Mood and Affect: Mood normal.        Assessment & Plan:  [place] was seen today for headache.  Acute nonintractable headache, unspecified headache type -     CT Head Wo Contrast; Future - patient with normal neuro on exam - with family history and symptoms will get Stat CT head to rule out SAH - if negative may start on blood thinner and refer to neuro - possible TMJ- will given information -     cyclobenzaprine (FLEXERIL) 10 MG tablet; Take 1 tablet (10 mg total) by mouth 3 (three) times daily as needed for muscle spasms.

## 2019-10-03 ENCOUNTER — Ambulatory Visit (INDEPENDENT_AMBULATORY_CARE_PROVIDER_SITE_OTHER): Payer: Medicare Other | Admitting: Physician Assistant

## 2019-10-03 ENCOUNTER — Other Ambulatory Visit: Payer: Self-pay

## 2019-10-03 ENCOUNTER — Encounter: Payer: Self-pay | Admitting: Physician Assistant

## 2019-10-03 DIAGNOSIS — R519 Headache, unspecified: Secondary | ICD-10-CM | POA: Diagnosis not present

## 2019-10-03 MED ORDER — CYCLOBENZAPRINE HCL 10 MG PO TABS: 10.0000 mg | ORAL_TABLET | Freq: Three times a day (TID) | ORAL | 1 refills | Status: DC | PRN

## 2019-10-03 NOTE — Patient Instructions (Addendum)
Will go to the ER if worsening headache, changes vision/speech, imbalance, weakness.  Will get CT head without contrast  What is the TMJ? The temporomandibular (tem-PUH-ro-man-DIB-yoo-ler) joint, or the TMJ, connects the upper and lower jawbones. This joint allows the jaw to open wide and move back and forth when you chew, talk, or yawn.There are also several muscles that help this joint move. There can be muscle tightness and pain in the muscle that can cause several symptoms.  What causes TMJ pain? There are many causes of TMJ pain. Repeated chewing (for example, chewing gum) and clenching your teeth can cause pain in the joint. Some TMJ pain has no obvious cause. What can I do to ease the pain? There are many things you can do to help your pain get better. When you have pain:  Eat soft foods and stay away from chewy foods (for example, taffy) Try to use both sides of your mouth to chew Don't chew gum Massage Don't open your mouth wide (for example, during yawning or singing) Don't bite your cheeks or fingernails Lower your amount of stress and worry Applying a warm, damp washcloth to the joint may help. Over-the-counter pain medicines such as ibuprofen (one brand: Advil) or acetaminophen (one brand: Tylenol) might also help. Do not use these medicines if you are allergic to them or if your doctor told you not to use them. How can I stop the pain from coming back? When your pain is better, you can do these exercises to make your muscles stronger and to keep the pain from coming back:  Resisted mouth opening: Place your thumb or two fingers under your chin and open your mouth slowly, pushing up lightly on your chin with your thumb. Hold for three to six seconds. Close your mouth slowly. Resisted mouth closing: Place your thumbs under your chin and your two index fingers on the ridge between your mouth and the bottom of your chin. Push down lightly on your chin as you close your mouth. Tongue  up: Slowly open and close your mouth while keeping the tongue touching the roof of the mouth. Side-to-side jaw movement: Place an object about one fourth of an inch thick (for example, two tongue depressors) between your front teeth. Slowly move your jaw from side to side. Increase the thickness of the object as the exercise becomes easier Forward jaw movement: Place an object about one fourth of an inch thick between your front teeth and move the bottom jaw forward so that the bottom teeth are in front of the top teeth. Increase the thickness of the object as the exercise becomes easier. These exercises should not be painful. If it hurts to do these exercises, stop doing them and talk to your family doctor.

## 2019-10-04 ENCOUNTER — Ambulatory Visit
Admission: RE | Admit: 2019-10-04 | Discharge: 2019-10-04 | Disposition: A | Discharge status: Home / Self Care | Payer: Medicare Other | Source: Ambulatory Visit | Attending: Physician Assistant | Admitting: Physician Assistant

## 2019-10-04 DIAGNOSIS — G3281 Cerebellar ataxia in diseases classified elsewhere: Secondary | ICD-10-CM

## 2019-10-04 DIAGNOSIS — R519 Headache, unspecified: Secondary | ICD-10-CM

## 2019-10-04 DIAGNOSIS — G8929 Other chronic pain: Secondary | ICD-10-CM

## 2019-10-07 ENCOUNTER — Telehealth: Payer: Self-pay | Admitting: Internal Medicine

## 2019-10-07 NOTE — Telephone Encounter (Signed)
Patient calling in reference to his mychart message yesterday.

## 2019-10-09 NOTE — Telephone Encounter (Signed)
See mychart thread

## 2019-10-10 DIAGNOSIS — M1812 Unilateral primary osteoarthritis of first carpometacarpal joint, left hand: Secondary | ICD-10-CM | POA: Diagnosis not present

## 2019-10-14 ENCOUNTER — Ambulatory Visit (INDEPENDENT_AMBULATORY_CARE_PROVIDER_SITE_OTHER): Payer: Medicare Other | Admitting: *Deleted

## 2019-10-14 DIAGNOSIS — I480 Paroxysmal atrial fibrillation: Secondary | ICD-10-CM

## 2019-10-14 LAB — CUP PACEART REMOTE DEVICE CHECK
Date Time Interrogation Session: 20210404035410
Implantable Pulse Generator Implant Date: 20180312

## 2019-10-15 NOTE — Progress Notes (Signed)
ILR Remote 

## 2019-10-16 ENCOUNTER — Other Ambulatory Visit: Payer: Self-pay

## 2019-10-28 ENCOUNTER — Other Ambulatory Visit: Payer: Self-pay

## 2019-10-28 ENCOUNTER — Ambulatory Visit
Admission: RE | Admit: 2019-10-28 | Discharge: 2019-10-28 | Disposition: A | Discharge status: Home / Self Care | Payer: Medicare Other | Source: Ambulatory Visit | Attending: Physician Assistant | Admitting: Physician Assistant

## 2019-10-28 DIAGNOSIS — R42 Dizziness and giddiness: Secondary | ICD-10-CM | POA: Diagnosis not present

## 2019-10-28 DIAGNOSIS — G3281 Cerebellar ataxia in diseases classified elsewhere: Secondary | ICD-10-CM

## 2019-10-28 DIAGNOSIS — G8929 Other chronic pain: Secondary | ICD-10-CM

## 2019-10-30 ENCOUNTER — Other Ambulatory Visit: Payer: Self-pay | Admitting: Internal Medicine

## 2019-11-05 ENCOUNTER — Other Ambulatory Visit: Payer: Self-pay | Admitting: Internal Medicine

## 2019-11-05 DIAGNOSIS — Z981 Arthrodesis status: Secondary | ICD-10-CM | POA: Diagnosis not present

## 2019-11-05 DIAGNOSIS — M5030 Other cervical disc degeneration, unspecified cervical region: Secondary | ICD-10-CM | POA: Diagnosis not present

## 2019-11-05 DIAGNOSIS — G5603 Carpal tunnel syndrome, bilateral upper limbs: Secondary | ICD-10-CM | POA: Diagnosis not present

## 2019-11-05 DIAGNOSIS — R200 Anesthesia of skin: Secondary | ICD-10-CM | POA: Diagnosis not present

## 2019-11-05 DIAGNOSIS — M47812 Spondylosis without myelopathy or radiculopathy, cervical region: Secondary | ICD-10-CM | POA: Diagnosis not present

## 2019-11-08 ENCOUNTER — Other Ambulatory Visit: Payer: Self-pay | Admitting: Physician Assistant

## 2019-11-08 DIAGNOSIS — G4452 New daily persistent headache (NDPH): Secondary | ICD-10-CM

## 2019-11-11 NOTE — Progress Notes (Signed)
Subjective:    Patient ID: [name], male    DOB: [birth year], 74 y.o.   MRN: [id number]  HPI 74 y.o. WM with history of HTN, preDM, afib, aortic atherosclerosis, history of cervical spine surgeries presents with headache.    Patient was complaining of vertigo with lying down and standing for 1 month and HA for 3 weeks prior to the 09/12/19 visit.  He go this first Moderna shot 08/10/19 and second shot 09/09/19.  He denied light or sound sensitivity.  He denied vomiting but did have nausea.  The vertigo has gone away but he has daily headaches Frontal headaches 2-8.  Still has rare nausea with it.  Later in the day he will feel that his voice gets lower key/lower tone.  He has some dropping eye lids, no double vision.  He has decreased concentration, feels decreased desire to do things due to pain.   Some trouble swallowing but states since surgery and not worse later in the day.  No unusual joint or muscle aches.   Had Normal CT head on 10/04/2019 Normal MRI brain on 10/28/2019 He is s/p prosectomy with Dr. Tresa Moore.  He is not on elequis for Afib at this time due to rhythm control with flecainde and low afib burden on loop recorder.  Had normal eye exam on the 09/23/2019  He denies any associated neurological complications or symptoms, such as one-sided weakness, numbness, tingling, slurring of speech, droopy face, swallowing difficulties, diplopia, vision loss, hearing loss or tinnitus.    BMI is Body mass index is 30.24 kg/m., patient reports weight loss.  Wt Readings from Last 3 Encounters:  11/13/19 223 lb (101.2 kg)  10/03/19 228 lb (103.4 kg)  09/12/19 225 lb (102.1 kg)    Xray Cervical 07/2018 IMPRESSION: Interval C3-C4 ACDF and removal of C4-C5 ACDF hardware.  Blood pressure 120/66, pulse 63, temperature (!) 97.3 F (36.3 C), weight 223 lb (101.2 kg), SpO2 97 %.  Medications   Current Outpatient Medications (Cardiovascular):  .  flecainide (TAMBOCOR)  100 MG tablet, TAKE 1 TABLET BY MOUTH  TWICE DAILY .  losartan (COZAAR) 50 MG tablet, Take 1 tablet Daily for BP .  rosuvastatin (CRESTOR) 20 MG tablet, Take 1 tablet (20 mg total) by mouth daily. (Patient taking differently: Take 10 mg by mouth daily. Taking 1/2 tablet daily) .  verapamil (CALAN-SR) 120 MG CR tablet, Take 1 tablet Daily with food for BP & Heart Rhythm (Patient taking differently: Take 120 mg by mouth daily. )    Current Outpatient Medications (Hematological):  .  apixaban (ELIQUIS) 5 MG TABS tablet, Take 5 mg by mouth in the morning and at bedtime.  Current Outpatient Medications (Other):  Marland Kitchen  ALPRAZolam (XANAX) 1 MG tablet, TAKE 1/2 TO 1 TABLET AT  BEDTIME ONLY IF NEEDED FOR  SLEEP; LIMIT TO 5 DAYS PER  WEEK TO AVOID ADDICTION  (MUST LAST 90 DAYS). .  Carboxymethylcellulose Sodium (REFRESH TEARS OP), Place 1 drop into both eyes daily as needed (dry eyes).  .  Cholecalciferol (VITAMIN D3) 5000 units CAPS, Take 10,000 Units by mouth daily.  .  cyclobenzaprine (FLEXERIL) 10 MG tablet, Take 1 tablet (10 mg total) by mouth 3 (three) times daily as needed for muscle spasms. .  finasteride (PROSCAR) 5 MG tablet, Take 5 mg by mouth daily. .  Flaxseed, Linseed, (FLAX SEED OIL PO), Take 1 capsule by mouth daily. .  Multiple Vitamin (MULTIVITAMIN) tablet, Take 1 tablet by mouth daily. Marland Kitchen  Omega-3 Fatty Acids (FISH OIL PO), Take 1 capsule by mouth 2 (two) times daily. .  vitamin C (ASCORBIC ACID) 500 MG tablet, Take 500 mg by mouth daily. Marland Kitchen  zinc gluconate 50 MG tablet, Take 50 mg by mouth daily.  Problem list He has Essential hypertension; Hyperlipidemia, mixed; BPH with obstruction/lower urinary tract symptoms; Abnormal glucose; IBS (irritable bowel syndrome); Vitamin D deficiency; Obesity (BMI 30.0-34.9); Atrial fibrillation ([place]); History of SCC (squamous cell carcinoma) of skin; Aortic atherosclerosis ([place]); HNP (herniated nucleus pulposus), cervical; Insomnia; Arthritis of  carpometacarpal (CMC) joint of left thumb; Chronic pain of left thumb; and BPH (benign prostatic hyperplasia) on their problem list.   Review of Systems  Constitutional: Negative.   HENT: Positive for sore throat and voice change. Negative for congestion, dental problem, drooling, ear discharge, ear pain, facial swelling, hearing loss, postnasal drip, rhinorrhea, sinus pressure, sinus pain, sneezing, tinnitus and trouble swallowing.   Eyes: Negative.  Negative for photophobia and visual disturbance.  Respiratory: Negative.  Negative for cough.   Cardiovascular: Negative.  Negative for chest pain, palpitations and leg swelling.  Gastrointestinal: Positive for nausea. Negative for abdominal distention, abdominal pain, anal bleeding, blood in stool, constipation, diarrhea, rectal pain and vomiting.  Genitourinary: Negative.   Musculoskeletal: Negative.   Neurological: Positive for dizziness and headaches. Negative for tremors, seizures, syncope, facial asymmetry, speech difficulty, weakness, light-headedness and numbness.  Hematological: Negative.   Psychiatric/Behavioral: Negative.        Objective:   Physical Exam Constitutional:      Appearance: He is well-developed. He is not ill-appearing.  HENT:     Head: Normocephalic and atraumatic.     Mouth/Throat:     Comments: + TMJ tenderness bilaterally Eyes:     General: No visual field deficit.    Extraocular Movements: Extraocular movements intact.     Right eye: Normal extraocular motion.     Left eye: Normal extraocular motion.     Pupils: Pupils are equal, round, and reactive to light.  Cardiovascular:     Rate and Rhythm: Normal rate and regular rhythm.     Heart sounds: Normal heart sounds.  Pulmonary:     Effort: Pulmonary effort is normal.     Breath sounds: Normal breath sounds.  Abdominal:     General: Bowel sounds are normal.     Palpations: Abdomen is soft.     Tenderness: There is no abdominal tenderness.   Musculoskeletal:        General: Normal range of motion.     Cervical back: Normal range of motion and neck supple.  Skin:    General: Skin is warm and dry.     Findings: No rash.  Neurological:     Mental Status: He is alert and oriented to person, place, and time.     Cranial Nerves: No cranial nerve deficit, dysarthria or facial asymmetry.     Sensory: No sensory deficit.     Motor: No weakness.     Coordination: Romberg sign negative. Coordination normal.     Gait: Gait normal.     Deep Tendon Reflexes: Reflexes normal.  Psychiatric:        Mood and Affect: Mood normal.        Assessment & Plan:   Daily persistent  Negative CT head Negative MRI head Check inflammatory labs-? From vaccine ? Cluster headache, some watery of eye with it but no other nasal symptoms and he has dry eye history Going to see Dr. Domingo Cocking  Some decrease in his voice, some symptoms of muscle fatigue- rule out MG Suggest trial of prednisone- patient declines until after labs/referral Normal CBC  Future Appointments  Date Time Provider [place]  11/18/2019 11:30 AM [facility] DEVICE REMOTES [facility]  12/17/2019  2:30 PM Unk Pinto, MD [facility] None  12/23/2019 11:30 AM [facility] DEVICE REMOTES [facility]  01/27/2020 11:30 AM [facility] DEVICE REMOTES [facility]  03/02/2020 11:30 AM [facility] DEVICE REMOTES [facility]  04/06/2020 11:30 AM [facility] DEVICE REMOTES [facility]  05/04/2020  1:45 PM Allred, Jeneen Rinks, MD [facility]  05/11/2020 11:30 AM [facility] DEVICE REMOTES [facility]  06/15/2020 11:30 AM [facility] DEVICE REMOTES [facility]  07/09/2020  3:00 PM Unk Pinto, MD [facility] None  07/20/2020 11:30 AM [facility] DEVICE REMOTES [facility]  10/08/2020 11:15 AM Vicie Mutters, PA-C [facility] None

## 2019-11-13 ENCOUNTER — Encounter: Payer: Self-pay | Admitting: Physician Assistant

## 2019-11-13 ENCOUNTER — Other Ambulatory Visit: Payer: Self-pay

## 2019-11-13 ENCOUNTER — Ambulatory Visit (INDEPENDENT_AMBULATORY_CARE_PROVIDER_SITE_OTHER): Payer: Medicare Other | Admitting: Physician Assistant

## 2019-11-13 VITALS — BP 120/66 | HR 63 | Temp 97.3°F | Wt 223.0 lb

## 2019-11-13 DIAGNOSIS — R498 Other voice and resonance disorders: Secondary | ICD-10-CM

## 2019-11-13 DIAGNOSIS — G4452 New daily persistent headache (NDPH): Secondary | ICD-10-CM

## 2019-11-13 NOTE — Patient Instructions (Addendum)
General Headache Without Cause A headache is pain or discomfort felt around the head or neck area. The specific cause of a headache may not be found. There are many causes and types of headaches. A few common ones are:  Tension headaches.  Migraine headaches.  Cluster headaches.  Chronic daily headaches. Follow these instructions at home: Watch your condition for any changes. Let your health care provider know about them. Take these steps to help with your condition: Managing pain      Take over-the-counter and prescription medicines only as told by your health care provider.  Lie down in a dark, quiet room when you have a headache.  If directed, put ice on your head and neck area: ? Put ice in a plastic bag. ? Place a towel between your skin and the bag. ? Leave the ice on for 20 minutes, 2-3 times per day.  If directed, apply heat to the affected area. Use the heat source that your health care provider recommends, such as a moist heat pack or a heating pad. ? Place a towel between your skin and the heat source. ? Leave the heat on for 20-30 minutes. ? Remove the heat if your skin turns bright red. This is especially important if you are unable to feel pain, heat, or cold. You may have a greater risk of getting burned.  Keep lights dim if bright lights bother you or make your headaches worse. Eating and drinking  Eat meals on a regular schedule.  If you drink alcohol: ? Limit how much you use to:  0-1 drink a day for women.  0-2 drinks a day for men. ? Be aware of how much alcohol is in your drink. In the [place] one drink equals one 12 oz bottle of beer (355 mL), one 5 oz glass of wine (148 mL), or one 1 oz glass of hard liquor (44 mL).  Stop drinking caffeine, or decrease the amount of caffeine you drink. General instructions   Keep a headache journal to help find out what may trigger your headaches. For example, write down: ? What you eat and drink. ? How much  sleep you get. ? Any change to your diet or medicines.  Try massage or other relaxation techniques.  Limit stress.  Sit up straight, and do not tense your muscles.  Do not use any products that contain nicotine or tobacco, such as cigarettes, e-cigarettes, and chewing tobacco. If you need help quitting, ask your health care provider.  Exercise regularly as told by your health care provider.  Sleep on a regular schedule. Get 7-9 hours of sleep each night, or the amount recommended by your health care provider.  Keep all follow-up visits as told by your health care provider. This is important. Contact a health care provider if:  Your symptoms are not helped by medicine.  You have a headache that is different from the usual headache.  You have nausea or you vomit.  You have a fever. Get help right away if:  Your headache becomes severe quickly.  Your headache gets worse after moderate to intense physical activity.  You have repeated vomiting.  You have a stiff neck.  You have a loss of vision.  You have problems with speech.  You have pain in the eye or ear.  You have muscular weakness or loss of muscle control.  You lose your balance or have trouble walking.  You feel faint or pass out.  You have confusion.  You have a seizure. Summary  A headache is pain or discomfort felt around the head or neck area.  There are many causes and types of headaches. In some cases, the cause may not be found.  Keep a headache journal to help find out what may trigger your headaches. Watch your condition for any changes. Let your health care provider know about them.  Contact a health care provider if you have a headache that is different from the usual headache, or if your symptoms are not helped by medicine.  Get help right away if your headache becomes severe, you vomit, you have a loss of vision, you lose your balance, or you have a seizure. This information is not  intended to replace advice given to you by your health care provider. Make sure you discuss any questions you have with your health care provider. Document Revised: 01/15/2018 Document Reviewed: 01/15/2018 Elsevier Patient Education  [employer].   Myasthenia Gravis Myasthenia gravis (MG) is a long-term (chronic) condition that causes weakness in the muscles you can control (voluntary muscles). MG can affect any voluntary muscle. The muscles most often affected are the ones that control:  Eye movement.  Facial movements.   Swallowing. MG is a disease in which the body's disease-fighting system (immune system) attacks its own healthy tissues (autoimmune disease). When you have MG, your immune system makes proteins (antibodies) that block the chemical (acetylcholine) that your body needs to send nerve signals to your muscles. This causes muscle weakness. What are the causes? The exact cause of MG is not known. What increases the risk? The following factors may make you more likely to develop this condition:  Having an enlarged thymus gland. The thymus gland is located under the breastbone. It makes certain cells for the immune system.  Having a family history of MG. What are the signs or symptoms? Symptoms of MG may include:  Drooping eyelids.  Double vision.  Muscle weakness that gets worse with activity and gets better after rest.  Difficulty walking.  Trouble chewing and swallowing.  Trouble making facial expressions.  Slurred speech.  Weakness of the arms, hands, and legs. Sudden, severe difficulty breathing (myasthenic crisis) may develop after having:  An infection.  A fever.  A bad reaction to a medicine. Myasthenic crisis requires emergency breathing support. Sometimes symptoms of MG go away for a while (remission) and then come back later. How is this diagnosed? This condition may be diagnosed based on:  Your symptoms and medical history.  A  physical exam.  Blood tests.  Tests of your muscle strength and function.  Imaging tests, such as a CT scan or an MRI. How is this treated? The goal of treatment is to improve muscle strength. Treatment may include:  Taking medicine.  Making lifestyle changes that focus on saving your energy.  Doing physical therapy to gain strength.  Having surgery to remove the thymus gland (thymectomy). This may result in a long remission for some people.  Having a procedure to remove the acetylcholine antibodies (plasmapheresis).  Getting emergency breathing support, if you experience myasthenic crisis. If you experience remission, you may be able to stop treatment and then resume treatment when your symptoms return. Follow these instructions at home:   Take over-the-counter and prescription medicines only as told by your health care provider.  Get plenty of rest and sleep. Take frequent breaks to rest your eyes, especially when in bright light or working on a computer.  Maintain a healthy  diet and a healthy weight. Work with your health care provider or a diet and nutrition specialist (dietitian) if you need help.  Do exercises as told by your health care provider or physical therapist.  Do not use any products that contain nicotine or tobacco, such as cigarettes and e-cigarettes. If you need help quitting, ask your health care provider.  Prevent infections by: ? Washing your hands often with soap and water. If soap and water are not available, use hand sanitizer. ? Avoiding contact with other people who are sick. ? Avoiding touching your eyes, nose, and mouth. ? Cleaning surfaces in your home that are touched often using a disinfectant.  Keep all follow-up visits as told by your health care provider. This is important. Contact a health care provider if:  Your symptoms change or get worse, especially after having a fever or infection. Get help right away if:  You have trouble  breathing. Summary  Myasthenia gravis (MG) is a long-term (chronic) condition that causes weakness in the muscles you can control (voluntary muscles).  A symptom of MG is muscle weakness that gets worse with activity and gets better after rest.  Sudden, severe difficulty breathing (myasthenic crisis) may develop after having an infection, a fever, or a bad reaction to a medicine.  The goal of treatment is to improve muscle strength. Treatment may include medicines, lifestyle changes, physical therapy, surgery, plasmapheresis, or emergency breathing support. This information is not intended to replace advice given to you by your health care provider. Make sure you discuss any questions you have with your health care provider. Document Revised: 07/10/2017 Document Reviewed: 07/10/2017 Elsevier Patient Education  2020 [employer].

## 2019-11-14 LAB — CUP PACEART REMOTE DEVICE CHECK
Date Time Interrogation Session: 20210505035822
Implantable Pulse Generator Implant Date: 20180312

## 2019-11-15 MED ORDER — PREDNISONE 20 MG PO TABS: ORAL_TABLET | ORAL | 0 refills | Status: DC

## 2019-11-18 ENCOUNTER — Ambulatory Visit (INDEPENDENT_AMBULATORY_CARE_PROVIDER_SITE_OTHER): Payer: Medicare Other | Admitting: *Deleted

## 2019-11-18 DIAGNOSIS — I480 Paroxysmal atrial fibrillation: Secondary | ICD-10-CM | POA: Diagnosis not present

## 2019-11-19 LAB — SEDIMENTATION RATE: Sed Rate: 2 mm/h (ref 0–20)

## 2019-11-19 LAB — MYASTHENIA GRAVIS PANEL 2
A CHR BINDING ABS: <0.3 nmol/L
ACHR Blocking Abs: <15 % Inhibition (ref <15)
Acetylchol Modul Ab: 22 % Inhibition

## 2019-11-19 LAB — C-REACTIVE PROTEIN: CRP: 1.5 mg/L (ref <8.0)

## 2019-11-19 NOTE — Progress Notes (Signed)
[facility] Summary Report / Loop Recorder 

## 2019-11-21 DIAGNOSIS — G4452 New daily persistent headache (NDPH): Secondary | ICD-10-CM | POA: Diagnosis not present

## 2019-11-21 DIAGNOSIS — Z049 Encounter for examination and observation for unspecified reason: Secondary | ICD-10-CM | POA: Diagnosis not present

## 2019-12-03 DIAGNOSIS — G518 Other disorders of facial nerve: Secondary | ICD-10-CM | POA: Diagnosis not present

## 2019-12-03 DIAGNOSIS — M791 Myalgia, unspecified site: Secondary | ICD-10-CM | POA: Diagnosis not present

## 2019-12-03 DIAGNOSIS — G4452 New daily persistent headache (NDPH): Secondary | ICD-10-CM | POA: Diagnosis not present

## 2019-12-03 DIAGNOSIS — M542 Cervicalgia: Secondary | ICD-10-CM | POA: Diagnosis not present

## 2019-12-03 DIAGNOSIS — R42 Dizziness and giddiness: Secondary | ICD-10-CM | POA: Diagnosis not present

## 2019-12-17 ENCOUNTER — Ambulatory Visit: Payer: Medicare Other | Admitting: Internal Medicine

## 2019-12-23 ENCOUNTER — Ambulatory Visit (INDEPENDENT_AMBULATORY_CARE_PROVIDER_SITE_OTHER): Payer: Medicare Other | Admitting: *Deleted

## 2019-12-23 DIAGNOSIS — I480 Paroxysmal atrial fibrillation: Secondary | ICD-10-CM

## 2019-12-23 DIAGNOSIS — R002 Palpitations: Secondary | ICD-10-CM

## 2019-12-23 LAB — CUP PACEART REMOTE DEVICE CHECK
Date Time Interrogation Session: 20210613235825
Implantable Pulse Generator Implant Date: 20180312

## 2019-12-24 NOTE — Progress Notes (Signed)
[facility] Summary Report / Loop Recorder 

## 2019-12-31 ENCOUNTER — Telehealth: Payer: Self-pay | Admitting: Internal Medicine

## 2019-12-31 NOTE — Telephone Encounter (Signed)
I s/w the pt and advised him that our office will need to have a clearance form faxed over from the office that will be performing his procedure. Pt states the surgeon is Dr. Katy Apo with [facility] [place] [facility]. Pt gave the phone number to Dr. Prudencio Burly office [phone]. I assured the pt what I will do I will call Dr. Prudencio Burly office for the information for his procedure. Pt thanked me for the help.   I tried to reach Dr. Prudencio Burly office to obtain clearance information. I left a message for surgery scheduler to please fax over a clearance form to [phone].

## 2019-12-31 NOTE — Telephone Encounter (Signed)
Patient called stating he is having cataract surgery on 01/28/20 he needs to know if he needs to stop his Eliquis, and how far in advance.

## 2020-01-01 NOTE — Telephone Encounter (Signed)
I s/w Angie, surgery scheduler for Dr. Tyrone Schimke. Angie stated pt does not need to hold his Eliquis for cataract surgery. Angie states she had d/w the pt that he does need to hold his Eliquis. Angie states they were not requiring cardiac clearance. Angie thanked me for call. I will note the chart that cardiac clearance was not being requested by Dr. Prudencio Burly office, that the call came in from the pt asking about his Eliquis. I will close out this note.

## 2020-01-01 NOTE — Telephone Encounter (Signed)
I returned call and left message for Dr. Prudencio Burly surgery scheduler.

## 2020-01-01 NOTE — Telephone Encounter (Signed)
I tried to reach out to Dr. Marthe Patch surgery scheduler if they would please fax over a clearance form as the pt has called our office in regards to his surgery with Dr. Tyrone Schimke. Left message fax number [phone].

## 2020-01-01 NOTE — Telephone Encounter (Signed)
Angie called from [place] returning Carol's call.

## 2020-01-09 HISTORY — PX: CATARACT EXTRACTION: SUR2

## 2020-01-20 ENCOUNTER — Other Ambulatory Visit: Payer: Self-pay | Admitting: Internal Medicine

## 2020-01-20 NOTE — Telephone Encounter (Signed)
Prescription refill request for Eliquis received.  Last office visit: 07/22/2019 Scr: 0.87, 08/27/2019 Age: 74 y.o. Weight: 101.2 kg  Prescription refill sent.

## 2020-01-27 ENCOUNTER — Ambulatory Visit (INDEPENDENT_AMBULATORY_CARE_PROVIDER_SITE_OTHER): Payer: Medicare Other | Admitting: *Deleted

## 2020-01-27 DIAGNOSIS — I480 Paroxysmal atrial fibrillation: Secondary | ICD-10-CM | POA: Diagnosis not present

## 2020-01-27 LAB — CUP PACEART REMOTE DEVICE CHECK
Date Time Interrogation Session: 20210718230604
Implantable Pulse Generator Implant Date: 20180312

## 2020-01-28 DIAGNOSIS — H25811 Combined forms of age-related cataract, right eye: Secondary | ICD-10-CM | POA: Diagnosis not present

## 2020-01-28 DIAGNOSIS — H2511 Age-related nuclear cataract, right eye: Secondary | ICD-10-CM | POA: Diagnosis not present

## 2020-01-28 NOTE — Progress Notes (Signed)
[facility] Summary Report / Loop Recorder 

## 2020-02-18 ENCOUNTER — Telehealth: Payer: Self-pay

## 2020-02-18 NOTE — Telephone Encounter (Signed)
Carelink alert received Device at RRT as of 02/14/20.  Spoke with pt advised may unplug bedside monitor.  Sending return kit Paceart and Carelink updated, future remote checks cancelled.    Patient v/u to notify office if he would like to have device removed at next office appt so that insurance approval could be acquired.

## 2020-02-26 NOTE — Progress Notes (Signed)
MEDICARE ANNUAL WELLNESS VISIT AND FOLLOW UP Assessment:   Encounter for annual wellness medicare visit 1 year- declined labs- will get at CPE in Dec  Abscess ? Superficial thrombophlebitis versus seb cyst versus abscess Warm compresses, elevate, if any worsening symptoms start on bactrim No fluctuance, mild punctate mark at center.   Essential hypertension - continue medications, DASH diet, exercise and monitor at home. Call if greater than 130/80.  -     CBC with Differential/Platelet -     CMP/GFR -     TSH  Paroxysmal atrial fibrillation (HCC) NSR at this time, following Dr. Rayann Heman, continue elequis  Mixed hyperlipidemia -continue medications, check lipids, decrease fatty foods, increase activity.  -     Lipid panel  Other abnormal glucose Discussed general issues about diabetes pathophysiology and management., Educational material distributed., Suggested low cholesterol diet., Encouraged aerobic exercise., Discussed foot care., Reminded to get yearly retinal exam.  Medication management -     Magnesium  Vitamin D deficiency Continue supplement  Benign prostatic hyperplasia with urinary frequency Continue follow up urology - Dr. Tresa Moore  Irritable bowel syndrome, unspecified type Diet discussed  Class 1 obesity due to excess calories with serious comorbidity and body mass index (BMI) of 32.0 to 32.9 in adult - long discussion about weight loss, diet, and exercise  Obesity - long discussion about weight loss, diet, and exercise  History of SCC (squamous cell carcinoma) of skin Follow up DERM  Aortic atherosclerosis (HCC) Control blood pressure, cholesterol, glucose, increase exercise.    Future Appointments  Date Time Provider [place]  05/04/2020  1:45 PM Thompson Grayer, MD [facility]  07/09/2020  3:00 PM Unk Pinto, MD [facility] None  10/08/2020 11:15 AM Vicie Mutters, PA-C [facility] None     Plan:   During the course of  the visit the patient was educated and counseled about appropriate screening and preventive services including:    Pneumococcal vaccine   Influenza vaccine  Td vaccine  Screening electrocardiogram  Colorectal cancer screening  Diabetes screening  Glaucoma screening  Nutrition counseling    Subjective:  [name] is a 74 y.o. male who presents for Medicare Annual Wellness Visit and 3 month follow up for HTN, hyperlipidemia, and vitamin D Def.   He go this first Moderna shot 08/10/19 and second shot 09/09/19, with subsquent vertigo and irtractable headache. He had normal CT head on 10/04/2019 and MRI brain on 10/28/2019.  He followed up with Dr. Domingo Cocking and has been doing better.   He has history of Afib with decongestants, had loop recorder but the battery has died, and has not had afib. He is on tambocor, he did go back on the Eliquis due to his vertigo and headaches.   He had right cataract surgery last month, going to go back for left month.   He has rash where he shaves.   He has lump on his leg that he wants looked at.   Follows with Dr. Tresa Moore for BPH.   He is in with Dr. Archie Endo office for left knee PT.   BMI is Body mass index is 31.74 kg/m., he has not been working on diet.  Wt Readings from Last 3 Encounters:  02/27/20 234 lb (106.1 kg)  11/13/19 223 lb (101.2 kg)  10/03/19 228 lb (103.4 kg)   His blood pressure has been controlled at home, today their BP is BP: 116/80 He does workout. He denies chest pain, shortness of breath, dizziness.   He is  on cholesterol medication, on crestor 20 mg daily, has increased this and denies myalgias. His cholesterol is at goal. The cholesterol last visit was:   Lab Results  Component Value Date   CHOL 145 09/12/2019   HDL 46 09/12/2019   LDLCALC 80 09/12/2019   TRIG 111 09/12/2019   CHOLHDL 3.2 09/12/2019    Had elevated A1C in 2013, has done well with diet/exercise. Last A1C in the office was:  Lab Results   Component Value Date   HGBA1C 5.6 09/12/2019   Patient is on Vitamin D supplement.   Lab Results  Component Value Date   VD25OH 82 09/12/2019        Medication Review:  Current Outpatient Medications (Endocrine & Metabolic):    predniSONE (DELTASONE) 20 MG tablet, 2 tablets daily for 3 days, 1 tablet daily for 4 days.  Current Outpatient Medications (Cardiovascular):    flecainide (TAMBOCOR) 100 MG tablet, TAKE 1 TABLET BY MOUTH  TWICE DAILY   losartan (COZAAR) 50 MG tablet, Take 1 tablet Daily for BP   rosuvastatin (CRESTOR) 20 MG tablet, Take 1 tablet (20 mg total) by mouth daily. (Patient taking differently: Take 10 mg by mouth daily. Taking 1/2 tablet daily)   verapamil (CALAN-SR) 120 MG CR tablet, Take 1 tablet Daily with food for BP & Heart Rhythm (Patient taking differently: Take 120 mg by mouth daily. )    Current Outpatient Medications (Hematological):    ELIQUIS 5 MG TABS tablet, TAKE 1 TABLET BY MOUTH  TWICE DAILY  Current Outpatient Medications (Other):    ALPRAZolam (XANAX) 1 MG tablet, TAKE 1/2 TO 1 TABLET AT  BEDTIME ONLY IF NEEDED FOR  SLEEP; LIMIT TO 5 DAYS PER  WEEK TO AVOID ADDICTION  (MUST LAST 90 DAYS).   Carboxymethylcellulose Sodium (REFRESH TEARS OP), Place 1 drop into both eyes daily as needed (dry eyes).    Cholecalciferol (VITAMIN D3) 5000 units CAPS, Take 10,000 Units by mouth daily.    cyclobenzaprine (FLEXERIL) 10 MG tablet, Take 1 tablet (10 mg total) by mouth 3 (three) times daily as needed for muscle spasms.   finasteride (PROSCAR) 5 MG tablet, Take 5 mg by mouth daily.   Flaxseed, Linseed, (FLAX SEED OIL PO), Take 1 capsule by mouth daily.   Multiple Vitamin (MULTIVITAMIN) tablet, Take 1 tablet by mouth daily.   Omega-3 Fatty Acids (FISH OIL PO), Take 1 capsule by mouth 2 (two) times daily.   vitamin C (ASCORBIC ACID) 500 MG tablet, Take 500 mg by mouth daily.   zinc gluconate 50 MG tablet, Take 50 mg by mouth daily.  Current  Problems (verified) Patient Active Problem List   Diagnosis Date Noted   BPH (benign prostatic hyperplasia) 05/22/2019   Insomnia 08/09/2018   HNP (herniated nucleus pulposus), cervical 07/16/2018   Aortic atherosclerosis ([place]) 10/11/2017   Arthritis of carpometacarpal ([place]) joint of left thumb 07/27/2017   Chronic pain of left thumb 07/27/2017   History of SCC (squamous cell carcinoma) of skin 10/04/2016   Atrial fibrillation ([place]) 08/01/2016   Obesity (BMI 30.0-34.9) 09/30/2013   Essential hypertension    Hyperlipidemia, mixed    BPH with obstruction/lower urinary tract symptoms    Abnormal glucose    IBS (irritable bowel syndrome)    Vitamin D deficiency     Screening Tests Immunization History  Administered Date(s) Administered   DTaP 07/11/2005   Influenza Whole 04/02/2013   Influenza, High Dose Seasonal PF 05/01/2014, 05/21/2015, 03/29/2016, 05/31/2017, 05/05/2018, 05/01/2019   Moderna SARS-COVID-2  Vaccination 08/10/2019, 09/09/2019   Pneumococcal Conjugate-13 05/01/2014   Pneumococcal Polysaccharide-23 09/08/2008, 03/29/2016   Td 12/16/2015   Zoster 12/09/2008   Health Maintenance  Topic Date Due   INFLUENZA VACCINE  02/09/2020   Fecal DNA (Cologuard)  05/21/2021   TETANUS/TDAP  12/15/2025   COVID-19 Vaccine  Completed   Hepatitis C Screening  Completed   PNA vac Low Risk Adult  Completed    Preventative care: Last colonoscopy: 2008 Dr. Deatra Ina  Cologuard 05/2018 due 2022 Stress test: 2018 Echo 08/2016  CXR 09/2016  Prior vaccinations: TD or Tdap: 2017 Influenza: 2018 Pneumococcal: 2010, 2017 Prevnar 13: 2015 Shingles/Zostavax: 2010  Names of Other Physician/Practitioners you currently use: 1.  [facility] here for primary care 2. Dr. Satira Sark, eye doctor, visit yearly 2019 3. Dr. Jolayne Panther, dentist, q 6 months 2019  Patient Care Team: Unk Pinto, MD as PCP - General (Internal  Medicine) Thompson Grayer, MD as PCP - Cardiology (Cardiology) Charlotte Crumb, MD as Consulting Physician (Orthopedic Surgery) Inda Castle, MD (Inactive) as Consulting Physician (Gastroenterology) Alexis Frock, MD as Consulting Physician (Urology) Thompson Grayer, MD as Consulting Physician (Cardiology)   History reviewed: allergies, current medications, past family history, past medical history, past social history, past surgical history and problem list  Allergies Allergies  Allergen Reactions   Xarelto [Rivaroxaban] Other (See Comments)    Severe body aches   Atorvastatin Other (See Comments)    Myalgias   Clonazepam Other (See Comments)    "Made me jittery"   Tamsulosin Hcl Other (See Comments)    Hypotension    SURGICAL HISTORY He  has a past surgical history that includes Appendectomy (02-07-2007); Anterior cervical decomp/discectomy fusion (01-25-2002); LEFT INGUINAL HERNIA REPAIR (1970); Umbilical hernia repair (2003); LEFT ROTATOR CUFF REPAIR (2002); Anterior cruciate ligament repair (1990); Cervical fusion (2000); LEFT ANKLE SURG. (1980); Hip surgery (1999); Colonoscopy (05-30-2011); Tonsillectomy; Trigger finger release (Right, 07/17/2013); Tendon reconstruction (Left, 07/17/2013); LOOP RECORDER INSERTION (N/A, 09/19/2016); Hernia repair; Anterior cervical decomp/discectomy fusion (Bilateral, 07/16/2018); Carpal tunnel release (Right, 07/16/2018); and XI robotic assisted simple prostatectomy (N/A, 05/22/2019). FAMILY HISTORY His family history includes Alcohol abuse in his father; Diabetes in his father; Heart attack (age of onset: 45) in his father; Heart disease in his father and mother; Stroke (age of onset: 72) in his mother. SOCIAL HISTORY He  reports that he has never smoked. He has never used smokeless tobacco. He reports current alcohol use of about 4.0 standard drinks of alcohol per week. He reports that he does not use drugs.  MEDICARE WELLNESS  OBJECTIVES: Physical activity:   Cardiac risk factors:   Depression/mood screen:   Depression screen [facility] 2/9 06/09/2019  Decreased Interest 0  Down, Depressed, Hopeless 0  PHQ - 2 Score 0  Some recent data might be hidden    ADLs:  In your present state of health, do you have any difficulty performing the following activities: 06/09/2019 06/09/2019  Hearing? N N  Vision? N N  Difficulty concentrating or making decisions? N N  Walking or climbing stairs? N N  Dressing or bathing? N N  Doing errands, shopping? N N  Some recent data might be hidden     Cognitive Testing  Alert? Yes  Normal Appearance?Yes  Oriented to person? Yes  Place? Yes   Time? Yes  Recall of three objects?  Yes  Can perform simple calculations? Yes  Displays appropriate judgment?Yes  Can read the correct time from a watch face?Yes  EOL planning:  Does Patient Have a Medical Advance Directive?: Yes Type of Advance Directive: Healthcare Power of Attorney, Living will [place] in Chart?: No - copy requested   Objective:   Blood pressure 116/80, pulse 61, temperature 97.7 F (36.5 C), height 6' (1.829 m), weight 234 lb (106.1 kg), SpO2 95 %. Body mass index is 31.74 kg/m.  General appearance: alert, no distress, WD/WN, male HEENT: normocephalic, sclerae anicteric, TMs pearly, nares patent, no discharge or erythema, pharynx normal Oral cavity: MMM, no lesions Neck: supple, no lymphadenopathy, no thyromegaly, no masses Heart: RRR, normal S1, S2, no murmurs Lungs: CTA bilaterally, no wheezes, rhonchi, or rales Abdomen: +bs, soft, non tender, non distended, no masses, no hepatomegaly, no splenomegaly Musculoskeletal: nontender, right knee with obvious bony enlargement and effusion without injection or heat Extremities: no edema, no cyanosis, no clubbing Pulses: 2+ symmetric, upper and lower extremities, normal cap refill Neurological: alert, oriented x 3, CN2-12 intact,  strength normal upper extremities and lower extremities, sensation normal throughout, DTRs 2+ throughout, no cerebellar signs, gait antalgic Skin: left anterior shin with 3 cm erythematous, warm nodule with punctate on it.  Psychiatric: normal affect, behavior normal, pleasant      Medicare Attestation I have personally reviewed: The patient's medical and social history Their use of alcohol, tobacco or illicit drugs Their current medications and supplements The patient's functional ability including ADLs,fall risks, home safety risks, cognitive, and hearing and visual impairment Diet and physical activities Evidence for depression or mood disorders  The patient's weight, height, BMI, and visual acuity have been recorded in the chart.  I have made referrals, counseling, and provided education to the patient based on review of the above and I have provided the patient with a written personalized care plan for preventive services.     Vicie Mutters, PA-C   02/27/2020

## 2020-02-27 ENCOUNTER — Other Ambulatory Visit: Payer: Self-pay

## 2020-02-27 ENCOUNTER — Encounter: Payer: Self-pay | Admitting: Physician Assistant

## 2020-02-27 ENCOUNTER — Ambulatory Visit (INDEPENDENT_AMBULATORY_CARE_PROVIDER_SITE_OTHER): Payer: Medicare Other | Admitting: Physician Assistant

## 2020-02-27 VITALS — BP 116/80 | HR 61 | Temp 97.7°F | Ht 72.0 in | Wt 234.0 lb

## 2020-02-27 DIAGNOSIS — N401 Enlarged prostate with lower urinary tract symptoms: Secondary | ICD-10-CM

## 2020-02-27 DIAGNOSIS — N138 Other obstructive and reflux uropathy: Secondary | ICD-10-CM

## 2020-02-27 DIAGNOSIS — I10 Essential (primary) hypertension: Secondary | ICD-10-CM

## 2020-02-27 DIAGNOSIS — I700 Atherosclerosis of aorta: Secondary | ICD-10-CM

## 2020-02-27 DIAGNOSIS — I480 Paroxysmal atrial fibrillation: Secondary | ICD-10-CM | POA: Diagnosis not present

## 2020-02-27 DIAGNOSIS — R6889 Other general symptoms and signs: Secondary | ICD-10-CM | POA: Diagnosis not present

## 2020-02-27 DIAGNOSIS — Z79899 Other long term (current) drug therapy: Secondary | ICD-10-CM

## 2020-02-27 DIAGNOSIS — G4700 Insomnia, unspecified: Secondary | ICD-10-CM

## 2020-02-27 DIAGNOSIS — E669 Obesity, unspecified: Secondary | ICD-10-CM

## 2020-02-27 DIAGNOSIS — R7309 Other abnormal glucose: Secondary | ICD-10-CM | POA: Diagnosis not present

## 2020-02-27 DIAGNOSIS — K589 Irritable bowel syndrome without diarrhea: Secondary | ICD-10-CM | POA: Diagnosis not present

## 2020-02-27 DIAGNOSIS — E559 Vitamin D deficiency, unspecified: Secondary | ICD-10-CM | POA: Diagnosis not present

## 2020-02-27 DIAGNOSIS — E782 Mixed hyperlipidemia: Secondary | ICD-10-CM | POA: Diagnosis not present

## 2020-02-27 DIAGNOSIS — Z0000 Encounter for general adult medical examination without abnormal findings: Secondary | ICD-10-CM

## 2020-02-27 DIAGNOSIS — R3914 Feeling of incomplete bladder emptying: Secondary | ICD-10-CM

## 2020-02-27 DIAGNOSIS — Z0001 Encounter for general adult medical examination with abnormal findings: Secondary | ICD-10-CM | POA: Diagnosis not present

## 2020-02-27 DIAGNOSIS — L0291 Cutaneous abscess, unspecified: Secondary | ICD-10-CM | POA: Diagnosis not present

## 2020-02-27 MED ORDER — SULFAMETHOXAZOLE-TRIMETHOPRIM 800-160 MG PO TABS: 1.0000 | ORAL_TABLET | Freq: Two times a day (BID) | ORAL | 0 refills | Status: DC

## 2020-02-27 NOTE — Patient Instructions (Addendum)
Elevate your leg Put heating pad on your leg  If any worsening redness, warmth, spreading, or discharge from the area please start for ABX  VARICOSE VEINS Varicose veins are veins that have become enlarged and twisted. CAUSES This condition is the result of valves in the veins not working properly. Valves in the veins help return blood from the leg to the heart. When your calf muscles squeeze, the blood moves up your leg then the valves close and this continues until the blood gets back to your heart.  If these valves are damaged, blood flows backwards and backs up into the veins in the leg near the skin OR if your are sitting/standing for a long time without using your calf muscles the blood will back up into the veins in your legs. This causes the veins to become larger. People who are on their feet a lot, sit a lot without walking (like on a plane, at a desk, or in a car), who are pregnant, or who are overweight are more likely to develop varicose veins. SYMPTOMS   Bulging, twisted-appearing, bluish veins, most commonly found on the legs.  Leg pain or a feeling of heaviness. These symptoms may be worse at the end of the day.  Leg swelling.  Skin color changes. DIAGNOSIS  Varicose veins can usually be diagnosed with an exam of your legs by your caregiver. He or she may recommend an ultrasound of your leg veins. TREATMENT  Most varicose veins can be treated at home. However, other treatments are available for people who have persistent symptoms or who want to treat the cosmetic appearance of the varicose veins. But this is only cosmetic and they will return if not properly treated. These include:  Laser treatment of very small varicose veins.  Medicine that is shot (injected) into the vein. This medicine hardens the walls of the vein and closes off the vein. This treatment is called sclerotherapy. Afterwards, you may need to wear clothing or bandages that apply pressure.  Surgery. HOME  CARE INSTRUCTIONS   Do not stand or sit in one position for long periods of time. Do not sit with your legs crossed. Rest with your legs raised during the day.  Your legs have to be higher than your heart so that gravity will force the valves to open, so please really elevate your legs.   Wear elastic stockings or support hose. Do not wear other tight, encircling garments around the legs, pelvis, or waist.  [employer]  has a wide variety of well priced compression stockings. [place], [place] [id number] [phone]  OR THERE ARE COPPER INFUSED COMPRESSION SOCKS AT [facility] OR [facility]  [employer] also has great cheap/afforable stockings or socks- the socks are easier to get on your feet  - can also get a jacob's donner that helps you put on the sock  Walk as much as possible to increase blood flow.  Raise the foot of your bed at night with 2-inch blocks.  If you get a cut in the skin over the vein and the vein bleeds, lie down with your leg raised and press on it with a clean cloth until the bleeding stops. Then place a bandage (dressing) on the cut. See your caregiver if it continues to bleed or needs stitches. SEEK MEDICAL CARE IF:   The skin around your ankle starts to break down.  You have pain, redness, tenderness, or hard swelling developing in your leg over a vein.  You are uncomfortable due to leg pain. Document Released: 04/06/2005 Document Revised: 09/19/2011 Document Reviewed: 08/23/2010 [facility] Patient Information 2014 [place].   Folliculitis  Folliculitis is inflammation of the hair follicles. Folliculitis most commonly occurs on the scalp, thighs, legs, back, and buttocks. However, it can occur anywhere on the body. What are the causes? This condition may be caused by:  A bacterial infection (common).  A fungal infection.  A viral infection.  Contact with certain chemicals, especially oils and tars.  Shaving or waxing.  Greasy  ointments or creams applied to the skin. Long-lasting folliculitis and folliculitis that keeps coming back may be caused by bacteria. This bacteria can live anywhere on your skin and is often found in the nostrils. What increases the risk? You are more likely to develop this condition if you have:  A weakened immune system.  Diabetes.  Obesity. What are the signs or symptoms? Symptoms of this condition include:  Redness.  Soreness.  Swelling.  Itching.  Small white or yellow, pus-filled, itchy spots (pustules) that appear over a reddened area. If there is an infection that goes deep into the follicle, these may develop into a boil (furuncle).  A group of closely packed boils (carbuncle). These tend to form in hairy, sweaty areas of the body. How is this diagnosed? This condition is diagnosed with a skin exam. To find what is causing the condition, your health care provider may take a sample of one of the pustules or boils for testing in a lab. How is this treated? This condition may be treated by:  Applying warm compresses to the affected areas.  Taking an antibiotic medicine or applying an antibiotic medicine to the skin.  Applying or bathing with an antiseptic solution.  Taking an over-the-counter medicine to help with itching.  Having a procedure to drain any pustules or boils. This may be done if a pustule or boil contains a lot of pus or fluid.  Having laser hair removal. This may be done to treat long-lasting folliculitis. Follow these instructions at home: Managing pain and swelling   If directed, apply heat to the affected area as often as told by your health care provider. Use the heat source that your health care provider recommends, such as a moist heat pack or a heating pad. ? Place a towel between your skin and the heat source. ? Leave the heat on for 20-30 minutes. ? Remove the heat if your skin turns bright red. This is especially important if you are  unable to feel pain, heat, or cold. You may have a greater risk of getting burned. General instructions  If you were prescribed an antibiotic medicine, take it or apply it as told by your health care provider. Do not stop using the antibiotic even if your condition improves.  Check the irritated area every day for signs of infection. Check for: ? Redness, swelling, or pain. ? Fluid or blood. ? Warmth. ? Pus or a bad smell.  Do not shave irritated skin.  Take over-the-counter and prescription medicines only as told by your health care provider.  Keep all follow-up visits as told by your health care provider. This is important. Get help right away if:  You have more redness, swelling, or pain in the affected area.  Red streaks are spreading from the affected area.  You have a fever. Summary  Folliculitis is inflammation of the hair follicles. Folliculitis most commonly occurs on the scalp, thighs, legs, back, and buttocks.  This condition may be treated by taking an antibiotic medicine or applying an antibiotic medicine to the skin, and applying or bathing with an antiseptic solution.  If you were prescribed an antibiotic medicine, take it or apply it as told by your health care provider. Do not stop using the antibiotic even if your condition improves.  Get help right away if you have new or worsening symptoms.  Keep all follow-up visits as told by your health care provider. This is important. This information is not intended to replace advice given to you by your health care provider. Make sure you discuss any questions you have with your health care provider. Document Revised: 02/03/2018 Document Reviewed: 02/03/2018 Elsevier Patient Education  2020 [employer] insurance and pharmacy about shingrix - it is a 2 part shot that we will not be getting in the office.   Suggest getting AFTER covid vaccines, have to wait at least a month This shot can make you feel bad  due to such good immune response it can trigger some inflammation so take tylenol or aleve day of or day after and plan on resting.   Can go to [email or web address] for more information  Shingrix Vaccination  Two vaccines are licensed and recommended to prevent shingles in the [place] Zoster vaccine live (ZVL, Zostavax) has been in use since 2006. Recombinant zoster vaccine (RZV, Shingrix), has been in use since 2017 and is recommended by ACIP as the preferred shingles vaccine.  What Everyone Should Know about Shingles Vaccine (Shingrix) One of the Recommended Vaccines by Disease Shingles vaccination is the only way to protect against shingles and postherpetic neuralgia (PHN), the most common complication from shingles. CDC recommends that healthy adults 50 years and older get two doses of the shingles vaccine called Shingrix (recombinant zoster vaccine), separated by 2 to 6 months, to prevent shingles and the complications from the disease. Your doctor or pharmacist can give you Shingrix as a shot in your upper arm. Shingrix provides strong protection against shingles and PHN. Two doses of Shingrix is more than 90% effective at preventing shingles and PHN. Protection stays above 85% for at least the first four years after you get vaccinated. Shingrix is the preferred vaccine, over Zostavax (zoster vaccine live), a shingles vaccine in use since 2006. Zostavax may still be used to prevent shingles in healthy adults 60 years and older. For example, you could use Zostavax if a person is allergic to Shingrix, prefers Zostavax, or requests immediate vaccination and Shingrix is unavailable. Who Should Get Shingrix? Healthy adults 50 years and older should get two doses of Shingrix, separated by 2 to 6 months. You should get Shingrix even if in the past you . had shingles  . received Zostavax  . are not sure if you had chickenpox There is no maximum age for  getting Shingrix. If you had shingles in the past, you can get Shingrix to help prevent future occurrences of the disease. There is no specific length of time that you need to wait after having shingles before you can receive Shingrix, but generally you should make sure the shingles rash has gone away before getting vaccinated. You can get Shingrix whether or not you remember having had chickenpox in the past. Studies show that more than 99% of Americans 40 years and older have had chickenpox, even if they don't remember having the disease. Chickenpox and shingles are related because they are caused by the same virus (varicella zoster virus). After  a person recovers from chickenpox, the virus stays dormant (inactive) in the body. It can reactivate years later and cause shingles. If you had Zostavax in the recent past, you should wait at least eight weeks before getting Shingrix. Talk to your healthcare provider to determine the best time to get Shingrix. Shingrix is available in [employer] [facility] and pharmacies. To find doctor's offices or pharmacies near you that offer the vaccine, visit HealthMap Vaccine FinderExternal. If you have questions about Shingrix, talk with your healthcare provider. Vaccine for Those 57 Years and Older  Shingrix reduces the risk of shingles and PHN by more than 90% in people 87 and older. CDC recommends the vaccine for healthy adults 34 and older.  Who Should Not Get Shingrix? You should not get Shingrix if you: . have ever had a severe allergic reaction to any component of the vaccine or after a dose of Shingrix  . tested negative for immunity to varicella zoster virus. If you test negative, you should get chickenpox vaccine.  . currently have shingles  . currently are pregnant or breastfeeding. Women who are pregnant or breastfeeding should wait to get Shingrix.  Marland Kitchen receive specific antiviral drugs (acyclovir, famciclovir, or valacyclovir) 24 hours before vaccination  (avoid use of these antiviral drugs for 14 days after vaccination)- zoster vaccine live only If you have a minor acute (starts suddenly) illness, such as a cold, you may get Shingrix. But if you have a moderate or severe acute illness, you should usually wait until you recover before getting the vaccine. This includes anyone with a temperature of 101.23F or higher. The side effects of the Shingrix are temporary, and usually last 2 to 3 days. While you may experience pain for a few days after getting Shingrix, the pain will be less severe than having shingles and the complications from the disease. How Well Does Shingrix Work? Two doses of Shingrix provides strong protection against shingles and postherpetic neuralgia (PHN), the most common complication of shingles. . In adults 57 to 74 years old who got two doses, Shingrix was 97% effective in preventing shingles; among adults 70 years and older, Shingrix was 91% effective.  . In adults 29 to 74 years old who got two doses, Shingrix was 91% effective in preventing PHN; among adults 70 years and older, Shingrix was 89% effective. Shingrix protection remained high (more than 85%) in people 70 years and older throughout the four years following vaccination. Since your risk of shingles and PHN increases as you get older, it is important to have strong protection against shingles in your older years. Top of Page  What Are the Possible Side Effects of Shingrix? Studies show that Shingrix is safe. The vaccine helps your body create a strong defense against shingles. As a result, you are likely to have temporary side effects from getting the shots. The side effects may affect your ability to do normal daily activities for 2 to 3 days. Most people got a sore arm with mild or moderate pain after getting Shingrix, and some also had redness and swelling where they got the shot. Some people felt tired, had muscle pain, a headache, shivering, fever, stomach pain, or  nausea. About 1 out of 6 people who got Shingrix experienced side effects that prevented them from doing regular activities. Symptoms went away on their own in about 2 to 3 days. Side effects were more common in younger people. You might have a reaction to the first or second dose of Shingrix, or  both doses. If you experience side effects, you may choose to take over-the-counter pain medicine such as ibuprofen or acetaminophen. If you experience side effects from Shingrix, you should report them to the Vaccine Adverse Event Reporting System (VAERS). Your doctor might file this report, or you can do it yourself through the VAERS websiteExternal, or by calling [phone]. If you have any questions about side effects from Shingrix, talk with your doctor. The shingles vaccine does not contain thimerosal (a preservative containing mercury). Top of Page  When Should I See a Doctor Because of the Side Effects I Experience From Shingrix? In clinical trials, Shingrix was not associated with serious adverse events. In fact, serious side effects from vaccines are extremely rare. For example, for every 1 million doses of a vaccine given, only one or two people may have a severe allergic reaction. Signs of an allergic reaction happen within minutes or hours after vaccination and include hives, swelling of the face and throat, difficulty breathing, a fast heartbeat, dizziness, or weakness. If you experience these or any other life-threatening symptoms, see a doctor right away. Shingrix causes a strong response in your immune system, so it may produce short-term side effects more intense than you are used to from other vaccines. These side effects can be uncomfortable, but they are expected and usually go away on their own in 2 or 3 days. Top of Page  How Can I Pay For Shingrix? There are several ways shingles vaccine may be paid for: Medicare . Medicare Part D plans cover the shingles vaccine, but there may be a  cost to you depending on your plan. There may be a copay for the vaccine, or you may need to pay in full then get reimbursed for a certain amount.  . Medicare Part B does not cover the shingles vaccine. Medicaid . Medicaid may or may not cover the vaccine. Contact your insurer to find out. Private health insurance . Many private health insurance plans will cover the vaccine, but there may be a cost to you depending on your plan. Contact your insurer to find out. Vaccine assistance programs . Some pharmaceutical companies provide vaccines to eligible adults who cannot afford them. You may want to check with the vaccine manufacturer, [employer], about Shingrix. If you do not currently have health insurance, learn more about affordable health coverage optionsExternal. To find doctor's offices or pharmacies near you that offer the vaccine, visit HealthMap Vaccine FinderExternal.

## 2020-03-03 DIAGNOSIS — H25812 Combined forms of age-related cataract, left eye: Secondary | ICD-10-CM | POA: Diagnosis not present

## 2020-03-03 DIAGNOSIS — H2512 Age-related nuclear cataract, left eye: Secondary | ICD-10-CM | POA: Diagnosis not present

## 2020-03-12 DIAGNOSIS — M1812 Unilateral primary osteoarthritis of first carpometacarpal joint, left hand: Secondary | ICD-10-CM | POA: Diagnosis not present

## 2020-03-12 DIAGNOSIS — M19021 Primary osteoarthritis, right elbow: Secondary | ICD-10-CM | POA: Diagnosis not present

## 2020-03-22 ENCOUNTER — Other Ambulatory Visit: Payer: Self-pay | Admitting: Internal Medicine

## 2020-04-16 ENCOUNTER — Other Ambulatory Visit: Payer: Self-pay | Admitting: Physician Assistant

## 2020-05-04 ENCOUNTER — Ambulatory Visit (INDEPENDENT_AMBULATORY_CARE_PROVIDER_SITE_OTHER): Payer: Medicare Other | Admitting: Internal Medicine

## 2020-05-04 ENCOUNTER — Other Ambulatory Visit: Payer: Self-pay

## 2020-05-04 VITALS — BP 146/86 | HR 75 | Ht 72.0 in | Wt 231.0 lb

## 2020-05-04 DIAGNOSIS — I10 Essential (primary) hypertension: Secondary | ICD-10-CM

## 2020-05-04 DIAGNOSIS — I480 Paroxysmal atrial fibrillation: Secondary | ICD-10-CM

## 2020-05-04 NOTE — Progress Notes (Signed)
PCP: Johnathan Pinto, Perez  Primary EP: [name] is a 74 y.o. male who presents today for routine electrophysiology followup.  Since last being seen in our clinic, the patient reports doing very well.  No recent afib.  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  He has not been very active.  He also reports headaches which began after his [place] Vaccine but have subsequently improved.  Today, he denies symptoms of palpitations, chest pain, shortness of breath,  lower extremity edema, dizziness, presyncope, or syncope.  The patient is otherwise without complaint today.   Past Medical History:  Diagnosis Date  . A-fib ([place])   . Arthritis   . Benign prostatic hyperplasia   . BPH (benign prostatic hypertrophy)   . Carpal tunnel syndrome, bilateral   . Early cataract   . History of SCC (squamous cell carcinoma) of skin 10/04/2016  . HNP (herniated nucleus pulposus), cervical   . Hyperlipidemia   . Hypertension 2007  . IBS (irritable bowel syndrome)   . Mild acid reflux occasional  . Obesity   . Other abnormal glucose   . Pneumonia    " walking"  . Pre-diabetes   . Vitamin D deficiency   . Wears glasses    Past Surgical History:  Procedure Laterality Date  . ANTERIOR CERVICAL DECOMP/DISCECTOMY FUSION  01-25-2002   C4 - C5  . ANTERIOR CERVICAL DECOMP/DISCECTOMY FUSION Bilateral 07/16/2018   Procedure: REMOVAL OF ANTERIOR CERVICAL PLATE, ANTERIOR CERVICAL DECOMPRESSION/DISCECTOMY FUSION CERVICAL THREE- CERVICAL FOUR;  Surgeon: [name];  Location: [place];  Service: Neurosurgery;  Laterality: Bilateral;  REMOVAL OF ANTERIOR CERVICAL PLATE, ANTERIOR CERVICAL DECOMPRESSION/DISCECTOMY FUSION CERVICAL THREE- CERVICAL FOUR  . ANTERIOR CRUCIATE LIGAMENT REPAIR  1990   RIGHT  . APPENDECTOMY  02-07-2007  . CARPAL TUNNEL RELEASE Right 07/16/2018   Procedure: CARPAL TUNNEL RELEASE;  Surgeon: [name];  Location: [place];  Service: Neurosurgery;   Laterality: Right;  CARPAL TUNNEL RELEASE  . CATARACT EXTRACTION Right 01/2020  . CERVICAL FUSION  2000   C5 - 6  . COLONOSCOPY  05-30-2011   HEMORRHOIDS  . HERNIA REPAIR    . HIP SURGERY  1999   LEFT HIP --  REMOVAL CALCIUM BUILD-UP  . LEFT ANKLE SURG.  1980  . LEFT INGUINAL HERNIA REPAIR  1970  . LEFT ROTATOR CUFF REPAIR  2002  . LOOP RECORDER INSERTION N/A 09/19/2016   Procedure: Loop Recorder Insertion;  Surgeon: [name];  Location: [place] [facility];  Service: Cardiovascular;  Laterality: N/A;  . TENDON RECONSTRUCTION Left 07/17/2013   Procedure: LEFT ELBOW LATERAL RECONSTRUCTION;  Surgeon: Schuyler Amor, Perez;  Location: [place];  Service: Orthopedics;  Laterality: Left;  . TONSILLECTOMY    . TRIGGER FINGER RELEASE Right 07/17/2013   Procedure: RELEASE A-1 PULLEY RIGHT RING FINGER;  Surgeon: Schuyler Amor, Perez;  Location: [place];  Service: Orthopedics;  Laterality: Right;  . UMBILICAL HERNIA REPAIR  2003  . XI ROBOTIC ASSISTED SIMPLE PROSTATECTOMY N/A 05/22/2019   Procedure: XI ROBOTIC ASSISTED SIMPLE PROSTATECTOMY;  Surgeon: Alexis Frock, Perez;  Location: [facility];  Service: Urology;  Laterality: N/A;  3 HRS    ROS- all systems are reviewed and negatives except as per HPI above  Current Outpatient Medications  Medication Sig Dispense Refill  . ALPRAZolam (XANAX) 1 MG tablet Take    1/2 to 1 tablet     at Bedtime  ONLY if needed for Sleep  & please try to limit to 5 days /week to avoid Addiction & Dementia 90 tablet 0  . Carboxymethylcellulose Sodium (REFRESH TEARS OP) Place 1 drop into both eyes daily as needed (dry eyes).     . Cholecalciferol (VITAMIN D3) 5000 units CAPS Take 10,000 Units by mouth daily.     Marland Kitchen ELIQUIS 5 MG TABS tablet TAKE 1 TABLET BY MOUTH  TWICE DAILY 180 tablet 1  . finasteride (PROSCAR) 5 MG tablet Take 5 mg by mouth daily.    . Flaxseed, Linseed, (FLAX SEED OIL PO) Take 1 capsule by mouth daily.    .  flecainide (TAMBOCOR) 100 MG tablet TAKE 1 TABLET BY MOUTH  TWICE DAILY 180 tablet 3  . losartan (COZAAR) 50 MG tablet Take    1 tablet    Daily     For BP 90 tablet 0  . Multiple Vitamin (MULTIVITAMIN) tablet Take 1 tablet by mouth daily.    . Omega-3 Fatty Acids (FISH OIL PO) Take 1 capsule by mouth 2 (two) times daily.    . rosuvastatin (CRESTOR) 20 MG tablet Take 1 tablet (20 mg total) by mouth daily. (Patient taking differently: Take 10 mg by mouth daily. Taking 1/2 tablet daily) 90 tablet 3  . verapamil (CALAN-SR) 120 MG CR tablet Take    1 tablet     Daily     with Food    for BP & Heart Rhythm 90 tablet 3  . vitamin C (ASCORBIC ACID) 500 MG tablet Take 500 mg by mouth daily.    Marland Kitchen zinc gluconate 50 MG tablet Take 50 mg by mouth daily.     No current facility-administered medications for this visit.    Physical Exam: Vitals:   05/04/20 1353  BP: (!) 146/86  Pulse: 75  SpO2: 93%  Weight: 231 lb (104.8 kg)  Height: 6' (1.829 m)    GEN- The patient is well appearing, alert and oriented x 3 today.   Head- normocephalic, atraumatic Eyes-  Sclera clear, conjunctiva pink Ears- hearing intact Oropharynx- clear Lungs-  normal work of breathing Heart- Regular rate and rhythm  GI- soft,  Extremities- no clubbing, cyanosis, or edema  Wt Readings from Last 3 Encounters:  05/04/20 231 lb (104.8 kg)  02/27/20 234 lb (106.1 kg)  11/13/19 223 lb (101.2 kg)    EKG tracing ordered today is personally reviewed and shows sinus rhythm  Assessment and Plan:  1. Paroxysmal atrial fibrillation/ atrial flutter Doing very well with very low burden chads2vasc score is 2 (will be 3 with birthday in July).  He is on eliquis His ILR is at RRT.  We discussed at length today.  He does not wish to have this removed presently.  He will contact my office if he decides for me to remove the device.  2. HTN Stable No change required today  3. HL Continue crestor  Return in a year  Risks,  benefits and potential toxicities for medications prescribed and/or refilled reviewed with patient today.   Johnathan Grayer Perez, [facility] 05/04/2020 1:56 PM

## 2020-05-04 NOTE — Patient Instructions (Addendum)
Medication Instructions:  Your physician recommends that you continue on your current medications as directed. Please refer to the Current Medication list given to you today.  *If you need a refill on your cardiac medications before your next appointment, please call your pharmacy*  Lab Work: None ordered.  If you have labs (blood work) drawn today and your tests are completely normal, you will receive your results only by: Marland Kitchen MyChart Message (if you have MyChart) OR . A paper copy in the mail If you have any lab test that is abnormal or we need to change your treatment, we will call you to review the results.  Testing/Procedures: None ordered.  Follow-Up: At [facility], you and your health needs are our priority.  As part of our continuing mission to provide you with exceptional heart care, we have created designated Provider Care Teams.  These Care Teams include your primary Cardiologist (physician) and Advanced Practice Providers (APPs -  Physician Assistants and Nurse Practitioners) who all work together to provide you with the care you need, when you need it.  We recommend signing up for the patient portal called "MyChart".  Sign up information is provided on this After Visit Summary.  MyChart is used to connect with patients for Virtual Visits (Telemedicine).  Patients are able to view lab/test results, encounter notes, upcoming appointments, etc.  Non-urgent messages can be sent to your provider as well.   To learn more about what you can do with MyChart, go to [email or web address].    Your next appointment:   Your physician wants you to follow-up in: 05/03/21 at 12:15 with Dr. Rayann Heman.    Other Instructions:

## 2020-05-17 IMAGING — CT CT HEAD W/O CM
1 series · 16 of 30 positions shown, 20 images · non-contrast
Comparison: None.

CLINICAL DATA: Three-week history of headaches.

EXAM:
CT HEAD WITHOUT CONTRAST
TECHNIQUE: Contiguous axial images were obtained from the base of the skull
through the vertex without intravenous contrast.

[Series 2: head w/(date) · axial · 0.42mm/px · z∈[-192,-52]mm · 16 of 32 slices shown, 20 images]
[im 2/32  brain]
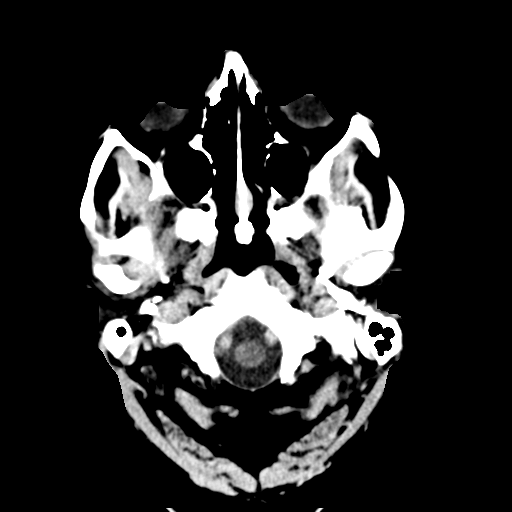
[im 2/32  bone]
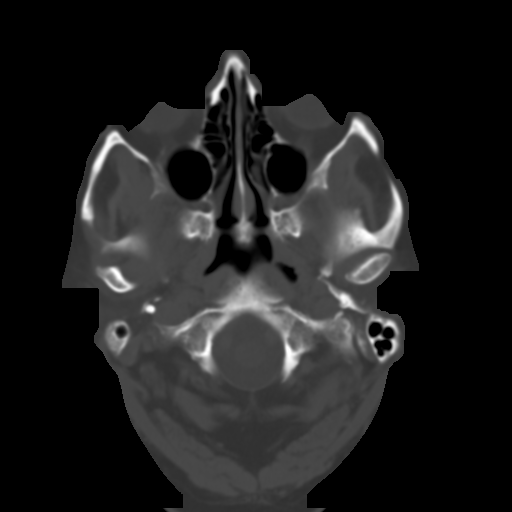
[im 4/32  brain]
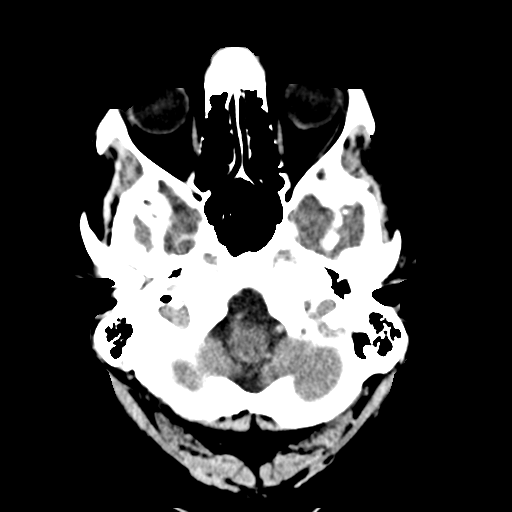
[im 6/32  brain]
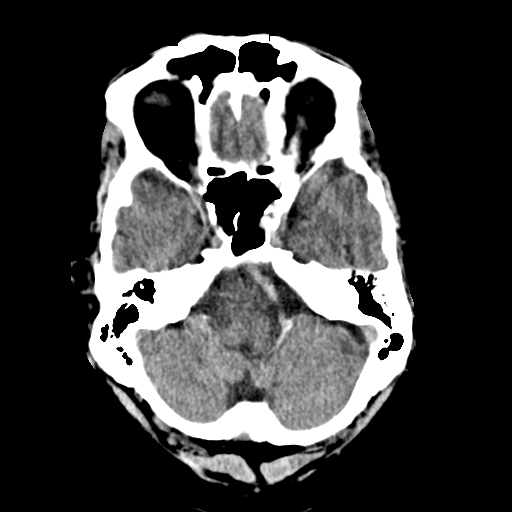
[im 8/32  brain]
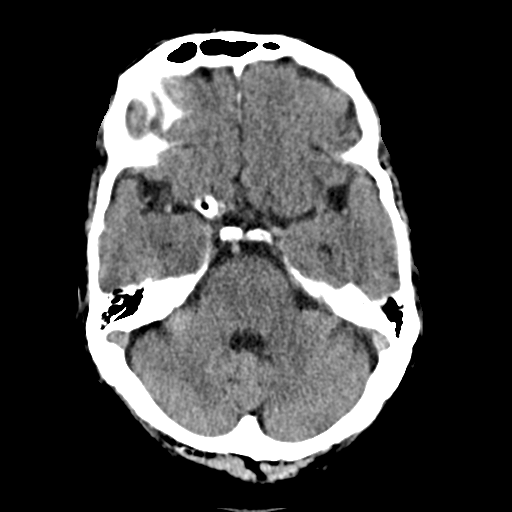
[im 9/32  brain]
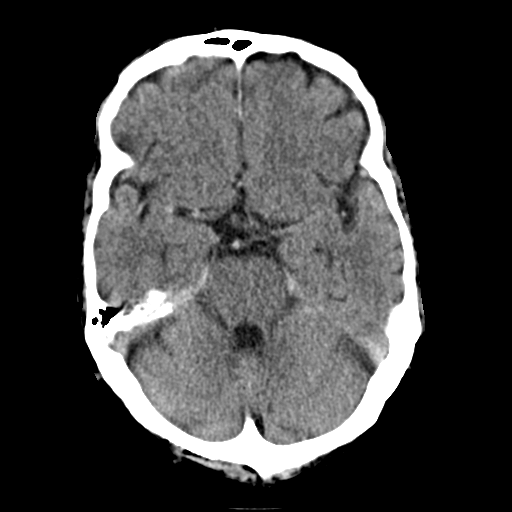
[im 9/32  bone]
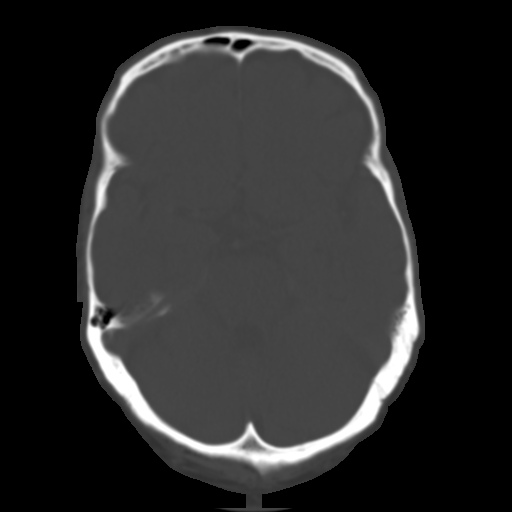
[im 11/32  brain]
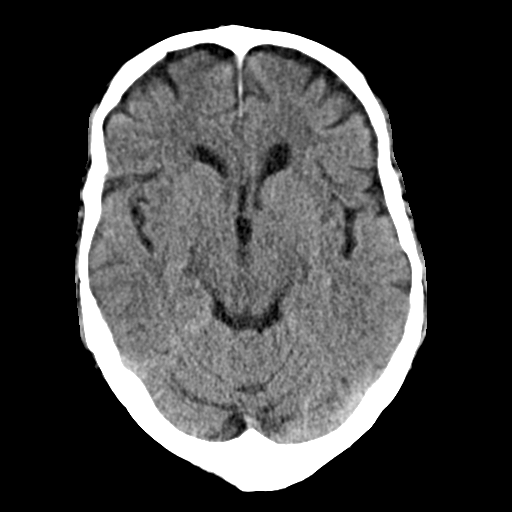
[im 13/32  brain]
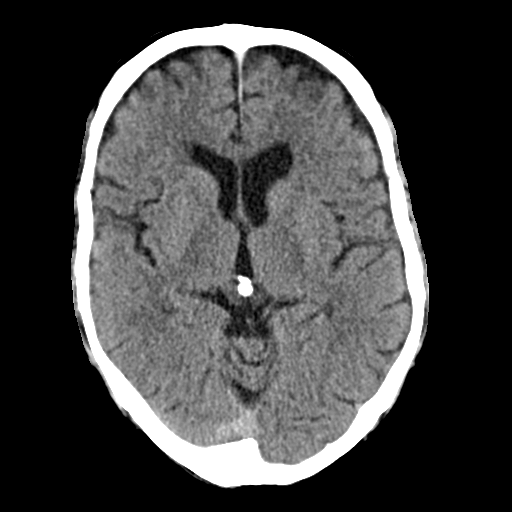
[im 15/32  brain]
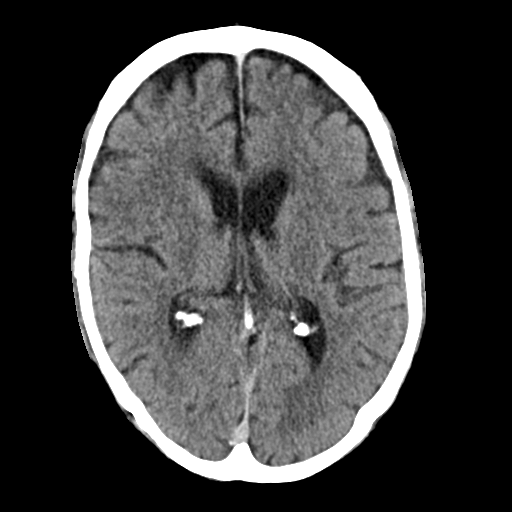
[im 17/32  brain]
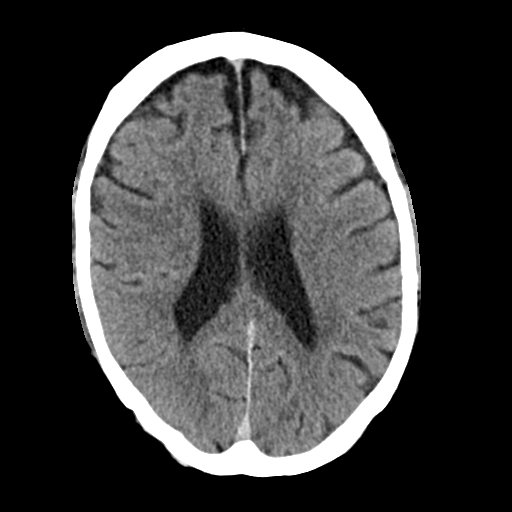
[im 17/32  bone]
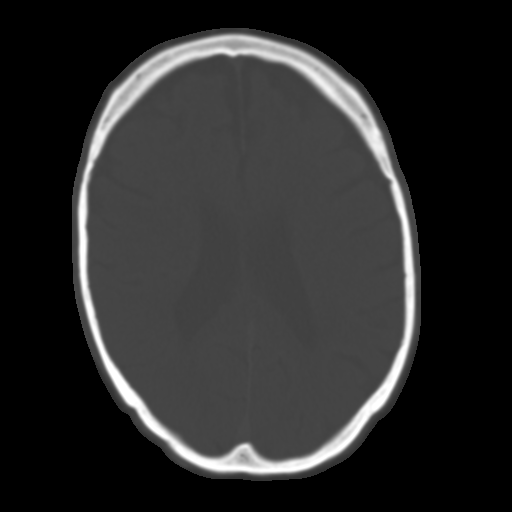
[im 19/32  brain]
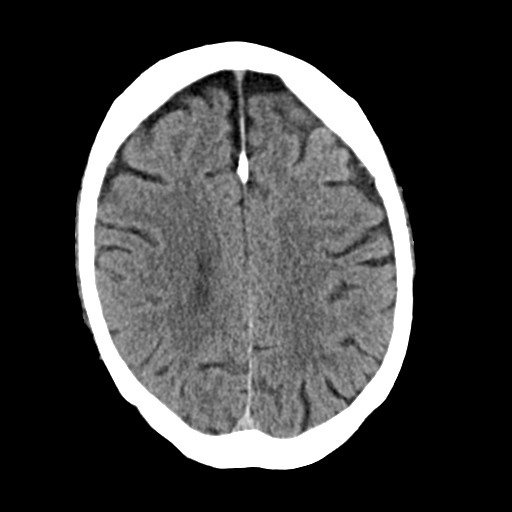
[im 21/32  brain]
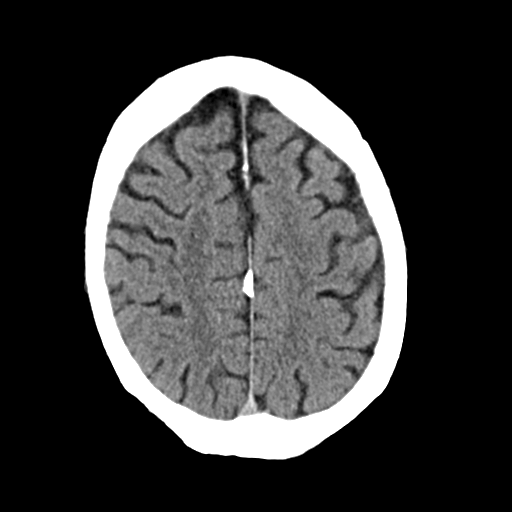
[im 23/32  brain]
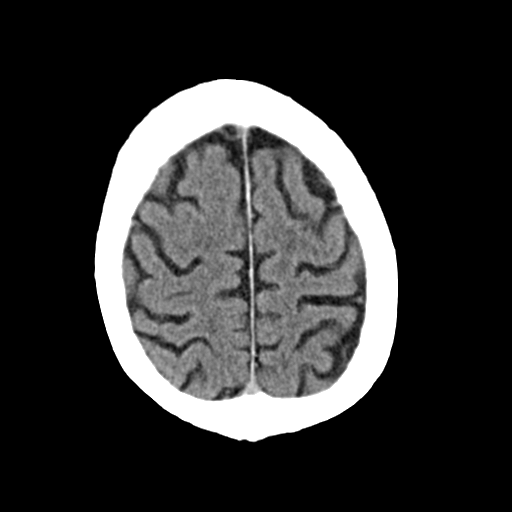
[im 24/32  brain]
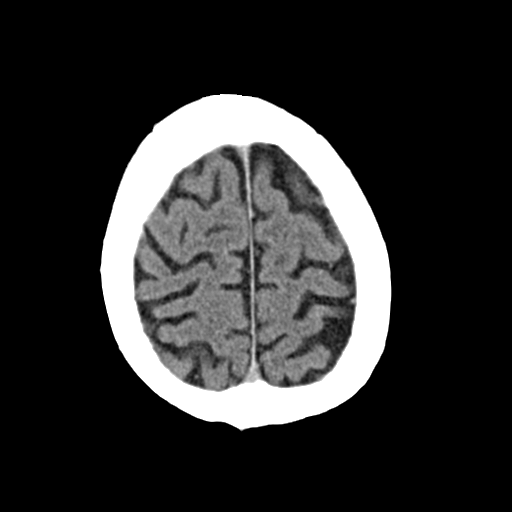
[im 24/32  bone]
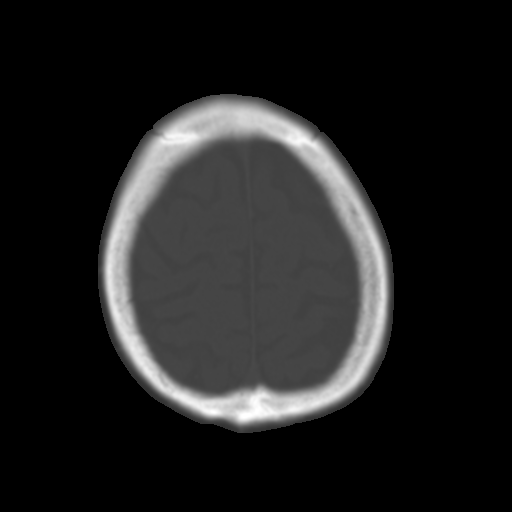
[im 26/32  brain]
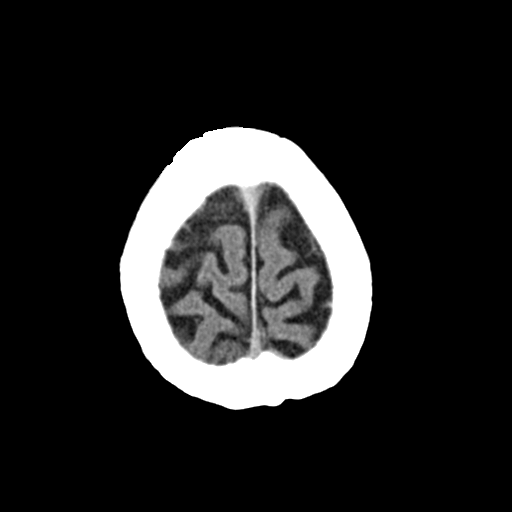
[im 28/32  brain]
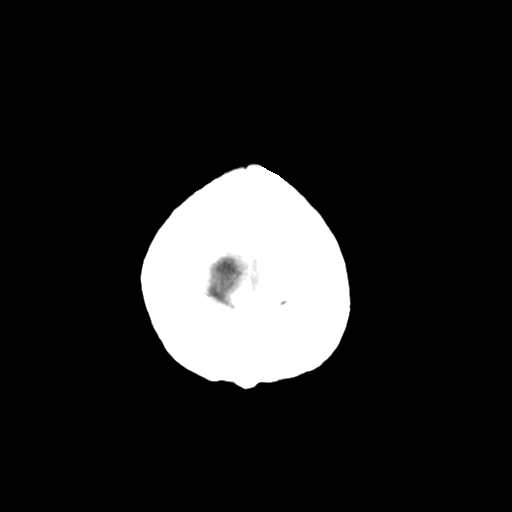
[im 30/32  brain]
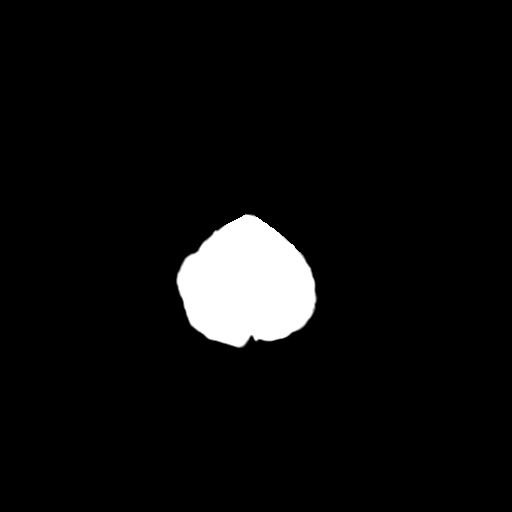

[16 of 30 positions shown; findings below may reference images not displayed]

FINDINGS: Brain: Age related cerebral atrophy, ventriculomegaly and
periventricular white matter disease. No extra-axial fluid
collections are identified. No CT findings for acute hemispheric
infarction or intracranial hemorrhage. No mass lesions. The
brainstem and cerebellum are normal.

Vascular: Minimal vascular calcifications. No aneurysm or hyperdense
vessels.

Skull: No skull fractures or bone lesions.

Sinuses/Orbits: The paranasal sinuses and mastoid air cells are
clear. The globes are intact.

Other: No scalp lesions or hematoma.
IMPRESSION: 1. Age related cerebral atrophy, ventriculomegaly and
periventricular white matter disease.
2. No acute intracranial findings or mass lesions.

## 2020-05-29 ENCOUNTER — Other Ambulatory Visit (HOSPITAL_COMMUNITY): Payer: Self-pay | Admitting: *Deleted

## 2020-05-29 MED ORDER — DILTIAZEM HCL 30 MG PO TABS
ORAL_TABLET | ORAL | 1 refills | Status: DC
Start: 2020-05-29 — End: 2022-08-07

## 2020-06-03 DIAGNOSIS — M545 Low back pain, unspecified: Secondary | ICD-10-CM | POA: Diagnosis not present

## 2020-06-03 DIAGNOSIS — M47816 Spondylosis without myelopathy or radiculopathy, lumbar region: Secondary | ICD-10-CM | POA: Diagnosis not present

## 2020-06-17 ENCOUNTER — Other Ambulatory Visit: Payer: Self-pay | Admitting: Internal Medicine

## 2020-06-17 DIAGNOSIS — M47816 Spondylosis without myelopathy or radiculopathy, lumbar region: Secondary | ICD-10-CM | POA: Diagnosis not present

## 2020-07-08 DIAGNOSIS — M5030 Other cervical disc degeneration, unspecified cervical region: Secondary | ICD-10-CM | POA: Diagnosis not present

## 2020-07-08 DIAGNOSIS — Z6831 Body mass index (BMI) 31.0-31.9, adult: Secondary | ICD-10-CM | POA: Diagnosis not present

## 2020-07-08 DIAGNOSIS — M542 Cervicalgia: Secondary | ICD-10-CM | POA: Diagnosis not present

## 2020-07-08 DIAGNOSIS — I10 Essential (primary) hypertension: Secondary | ICD-10-CM | POA: Diagnosis not present

## 2020-07-08 NOTE — Progress Notes (Signed)
Annual  Screening/Preventative Visit  & Comprehensive Evaluation & Examination      This very nice 74 y.o.  MWM presents for a Screening /Preventative Visit & comprehensive evaluation and management of multiple medical co-morbidities.  Patient has been followed for HTN, HLD, Prediabetes and Vitamin D Deficiency. Patient was noted to have Aortic atherosclerosis  by Chest CT scan on 10/11/2017.  Aortic atherosclerosis ([place]) by Chest CT scan on 10/11/2017.  In Nov 2020,  patient had TURP by Dr Carrie Mew.      HTN predates circa 2007. Patient's BP has been controlled at home.  Today's BP is at goal - 112/72. In 2018, he was dx'd with pAfib and started on Eliquis for CHADsVasc 2 by Dr Rayann Heman.  Patient denies any cardiac symptoms as chest pain, palpitations, shortness of breath, dizziness or ankle swelling.      Patient's hyperlipidemia is controlled with diet and Rosuvastatin. Patient denies myalgias or other medication SE's. Last lipids were at goal:  Lab Results  Component Value Date   CHOL 145 09/12/2019   HDL 46 09/12/2019   LDLCALC 80 09/12/2019   TRIG 111 09/12/2019   CHOLHDL 3.2 09/12/2019       Patient has hx/o prediabetes (A1c 5.7% /2013) and patient denies reactive hypoglycemic symptoms, visual blurring, diabetic polys or paresthesias. Last A1c was normal  & at goal:   Lab Results  Component Value Date   HGBA1C 5.6 09/12/2019        Finally, patient has history of Vitamin D Deficiency ("33" /2008) and last vitamin D was at goal   Lab Results  Component Value Date   VD25OH 82 09/12/2019    Current Outpatient Medications on File Prior to Visit  Medication Sig  . ALPRAZolam  1 MG tablet Take1/2 to 1 tablet at Bedtime  . REFRESH TEARS Place 1 drop into both eyes daily as needed    . VITAMIN D 5000 units  Take 10,000 Units  daily.   Marland Kitchen diltiazem  30 MG tablet Take 1 tablet every 4 hours AS NEEDED for AFIB   . ELIQUIS 5 MG TABS TAKE 1 TABLET TWICE DAILY  . finasteride  5 MG  tablet Take daily.  Marland Kitchen FLAX SEED OIL Take 1 capsule daily.  . flecainide  100 MG tablet TAKE 1 TABLET   TWICE DAILY  . losartan  50 MG tablet Take     1 tablet     Daily      for BP  . Multi-Vit Take 1 tablet  daily.  . Omega-3 FISH OIL Take 1 capsule  2  times daily.  . rosuvastatin  20 MG tablet Taking 1/2 tablet daily)  . verapamil-SR) 120 MG CR  Take  1 tablet  Daily   . vitamin C  500 MG tablet Take daily.  Marland Kitchen zinc 50 MG tablet Take  daily.    Allergies  Allergen Reactions  . Xarelto [Rivaroxaban] Other (See Comments)    Severe body aches  . Atorvastatin Other (See Comments)    Myalgias  . Clonazepam Other (See Comments)    "Made me jittery"  . Tamsulosin Hcl Other (See Comments)    Hypotension    Past Medical History:  Diagnosis Date  . A-fib ([place])   . Arthritis   . Benign prostatic hyperplasia   . BPH (benign prostatic hypertrophy)   . Carpal tunnel syndrome, bilateral   . Early cataract   . History of SCC (squamous cell carcinoma) of skin 10/04/2016  .  HNP (herniated nucleus pulposus), cervical   . Hyperlipidemia   . Hypertension 2007  . IBS (irritable bowel syndrome)   . Mild acid reflux occasional  . Obesity   . Other abnormal glucose   . Pneumonia    " walking"  . Pre-diabetes   . Vitamin D deficiency   . Wears glasses    Health Maintenance  Topic Date Due  . COVID-19 Vaccine (3 - Moderna risk 4-dose series) 10/07/2019  . INFLUENZA VACCINE  02/09/2020  . Fecal DNA (Cologuard)  05/21/2021  . TETANUS/TDAP  12/15/2025  . Hepatitis C Screening  Completed  . PNA vac Low Risk Adult  Completed   Immunization History  Administered Date(s) Administered  . DTaP 07/11/2005  . Influenza Whole 04/02/2013  . Influenza, High Dose Seasonal PF 05/01/2014, 05/21/2015, 03/29/2016, 05/31/2017, 05/05/2018, 05/01/2019  . Moderna Sars-Covid-2 Vaccination 08/10/2019, 09/09/2019  . Pneumococcal Conjugate-13 05/01/2014  . Pneumococcal Polysaccharide-23 09/08/2008,  03/29/2016  . Td 12/16/2015  . Zoster 12/09/2008   Last Colon - 08/08/2006 -   Cologard - 05/21/2018 - Negative - recc 3 year f/u due Nov 2022  Past Surgical History:  Procedure Laterality Date  . ANTERIOR CERVICAL DECOMP/DISCECTOMY FUSION  01-25-2002   C4 - C5  . ANTERIOR CERVICAL DECOMP/DISCECTOMY FUSION Bilateral 07/16/2018   Procedure: REMOVAL OF ANTERIOR CERVICAL PLATE, ANTERIOR CERVICAL DECOMPRESSION/DISCECTOMY FUSION CERVICAL THREE- CERVICAL FOUR;  Surgeon: Jovita Gamma, MD;  Location: [facility];  Service: Neurosurgery;  Laterality: Bilateral;  REMOVAL OF ANTERIOR CERVICAL PLATE, ANTERIOR CERVICAL DECOMPRESSION/DISCECTOMY FUSION CERVICAL THREE- CERVICAL FOUR  . ANTERIOR CRUCIATE LIGAMENT REPAIR  1990   RIGHT  . APPENDECTOMY  02-07-2007  . CARPAL TUNNEL RELEASE Right 07/16/2018   Procedure: CARPAL TUNNEL RELEASE;  Surgeon: Jovita Gamma, MD;  Location: [facility];  Service: Neurosurgery;  Laterality: Right;  CARPAL TUNNEL RELEASE  . CATARACT EXTRACTION Right 01/2020  . CERVICAL FUSION  2000   C5 - 6  . COLONOSCOPY  05-30-2011   HEMORRHOIDS  . HERNIA REPAIR    . HIP SURGERY  1999   LEFT HIP --  REMOVAL CALCIUM BUILD-UP  . LEFT ANKLE SURG.  1980  . LEFT INGUINAL HERNIA REPAIR  1970  . LEFT ROTATOR CUFF REPAIR  2002   Procedure: Loop Recorder Insertion - Thompson Grayer, MD  -  09/19/2016  . TENDON RECONSTRUCTION Left 07/17/2013   Procedure: LEFT ELBOW LATERAL RECONSTRUCTION; Schuyler Amor, MD  . TONSILLECTOMY    . TRIGGER FINGER RELEASE Right 07/17/2013   Procedure: RELEASE A-1 PULLEY RT RING FINGER;Matthew A Weingold, MD  . UMBILICAL HERNIA REPAIR  2003  . ROBOTIC ASSISTED SIMPLE PROSTATECTOMY Alexis Frock 05/22/2019   Family History  Problem Relation Age of Onset  . Heart disease Mother   . Stroke Mother 51  . Heart disease Father   . Diabetes Father   . Heart attack Father 67  . Alcohol abuse Father    Social History   Socioeconomic History  . Marital status:  Married    Spouse name: REbecca  . Number of children: 1  Occupational History  . Occupation: Personnel officer  Tobacco Use  . Smoking status: Never Smoker  . Smokeless tobacco: Never Used  Vaping Use  . Vaping Use: Never used  Substance and Sexual Activity  . Alcohol use: Yes    Alcohol/week: 4.0 standard drinks    Types: 4 Standard drinks or equivalent per week  . Drug use: No  . Sexual activity: Not on file    ROS  Constitutional: Denies fever, chills, weight loss/gain, headaches, insomnia,  night sweats or change in appetite. Does c/o fatigue. Eyes: Denies redness, blurred vision, diplopia, discharge, itchy or watery eyes.  ENT: Denies discharge, congestion, post nasal drip, epistaxis, sore throat, earache, hearing loss, dental pain, Tinnitus, Vertigo, Sinus pain or snoring.  Cardio: Denies chest pain, palpitations, irregular heartbeat, syncope, dyspnea, diaphoresis, orthopnea, PND, claudication or edema Respiratory: denies cough, dyspnea, DOE, pleurisy, hoarseness, laryngitis or wheezing.  Gastrointestinal: Denies dysphagia, heartburn, reflux, water brash, pain, cramps, nausea, vomiting, bloating, diarrhea, constipation, hematemesis, melena, hematochezia, jaundice or hemorrhoids Genitourinary: Denies dysuria, frequency, urgency, nocturia, hesitancy, discharge, hematuria or flank pain Musculoskeletal: Denies arthralgia, myalgia, stiffness, Jt. Swelling, pain, limp or strain/sprain. Denies Falls. Skin: Denies puritis, rash, hives, warts, acne, eczema or change in skin lesion Neuro: No weakness, tremor, incoordination, spasms, paresthesia or pain Psychiatric: Denies confusion, memory loss or sensory loss. Denies Depression. Endocrine: Denies change in weight, skin, hair change, nocturia, and paresthesia, diabetic polys, visual blurring or hyper / hypo glycemic episodes.  Heme/Lymph: No excessive bleeding, bruising or enlarged lymph nodes.  Physical Exam  BP 112/72   Pulse 70    Temp (!) 97.1 F (36.2 C)   Resp 16   Ht 6' (1.829 m)   Wt 238 lb 3.2 oz (108 kg)   SpO2 97%   BMI 32.31 kg/m   General Appearance: Well nourished and well groomed and in no apparent distress.  Eyes: PERRLA, EOMs, conjunctiva no swelling or erythema, normal fundi and vessels. Sinuses: No frontal/maxillary tenderness ENT/Mouth: EACs patent / TMs  nl. Nares clear without erythema, swelling, mucoid exudates. Oral hygiene is good. No erythema, swelling, or exudate. Tongue normal, non-obstructing. Tonsils not swollen or erythematous. Hearing normal.  Neck: Supple, thyroid not palpable. No bruits, nodes or JVD. Respiratory: Respiratory effort normal.  BS equal and clear bilateral without rales, rhonci, wheezing or stridor. Cardio: Heart sounds are normal with regular rate and rhythm and no murmurs, rubs or gallops. Peripheral pulses are normal and equal bilaterally without edema. No aortic or femoral bruits. Chest: symmetric with normal excursions and percussion.  Abdomen: Soft, with Nl bowel sounds. Nontender, no guarding, rebound, hernias, masses, or organomegaly.  Lymphatics: Non tender without lymphadenopathy.  Musculoskeletal: Full ROM all peripheral extremities, joint stability, 5/5 strength, and normal gait. Skin: Warm and dry without rashes, lesions, cyanosis, clubbing or  ecchymosis.  Neuro: Cranial nerves intact, reflexes equal bilaterally. Normal muscle tone, no cerebellar symptoms. Sensation intact.  Pysch: Alert and oriented X 3 with normal affect, insight and judgment appropriate.   Assessment and Plan  1. Essential hypertension  - EKG 12-Lead - [place], RETROPERITNL ABD,  LTD - Urinalysis, Routine w reflex microscopic - Microalbumin / creatinine urine ratio - CBC with Differential/Platelet - COMPLETE METABOLIC PANEL WITH GFR - Magnesium - TSH  2. Hyperlipidemia, mixed  - EKG 12-Lead - [place], RETROPERITNL ABD,  LTD - Lipid panel - TSH  3. Abnormal glucose  - EKG  12-Lead - [place], RETROPERITNL ABD,  LTD - Hemoglobin A1c - Insulin, random  4. Vitamin D deficiency  - VITAMIN D 25 Hydroxy   5. Paroxysmal atrial fibrillation ([facility])  - EKG 12-Lead - TSH  6. Aortic atherosclerosis ([facility]) by Chest CT scan on 10/11/2017  - EKG 12-Lead - [place], RETROPERITNL ABD,  LTD  7. Screening for ischemic heart disease  - EKG 12-Lead  8. FHx: heart disease  - EKG 12-Lead - [place], RETROPERITNL ABD,  LTD  9. Screening for AAA (aortic  abdominal aneurysm)  - [place], RETROPERITNL ABD,  LTD  10. Medication management  - Urinalysis, Routine w reflex microscopic - Microalbumin / creatinine urine ratio - CBC with Differential/Platelet - COMPLETE METABOLIC PANEL WITH GFR - Magnesium - Lipid panel - TSH - Hemoglobin A1c - Insulin, random - VITAMIN D 25 Hydroxy  11. Screening for colorectal cancer  - POC Hemoccult Bld/Stl           Patient was counseled in prudent diet, weight control to achieve/maintain BMI less than 25, BP monitoring, regular exercise and medications as discussed.  Discussed med effects and SE's. Routine screening labs and tests as requested with regular follow-up as recommended. Over 40 minutes of exam, counseling, chart review and high complex critical decision making was performed   Kirtland Bouchard, MD

## 2020-07-09 ENCOUNTER — Other Ambulatory Visit: Payer: Self-pay

## 2020-07-09 ENCOUNTER — Ambulatory Visit (INDEPENDENT_AMBULATORY_CARE_PROVIDER_SITE_OTHER): Payer: Medicare Other | Admitting: Internal Medicine

## 2020-07-09 VITALS — BP 112/72 | HR 70 | Temp 97.1°F | Resp 16 | Ht 72.0 in | Wt 238.2 lb

## 2020-07-09 DIAGNOSIS — I480 Paroxysmal atrial fibrillation: Secondary | ICD-10-CM

## 2020-07-09 DIAGNOSIS — I700 Atherosclerosis of aorta: Secondary | ICD-10-CM

## 2020-07-09 DIAGNOSIS — Z1211 Encounter for screening for malignant neoplasm of colon: Secondary | ICD-10-CM

## 2020-07-09 DIAGNOSIS — E559 Vitamin D deficiency, unspecified: Secondary | ICD-10-CM

## 2020-07-09 DIAGNOSIS — Z8249 Family history of ischemic heart disease and other diseases of the circulatory system: Secondary | ICD-10-CM | POA: Diagnosis not present

## 2020-07-09 DIAGNOSIS — R7309 Other abnormal glucose: Secondary | ICD-10-CM | POA: Diagnosis not present

## 2020-07-09 DIAGNOSIS — I10 Essential (primary) hypertension: Secondary | ICD-10-CM | POA: Diagnosis not present

## 2020-07-09 DIAGNOSIS — Z79899 Other long term (current) drug therapy: Secondary | ICD-10-CM | POA: Diagnosis not present

## 2020-07-09 DIAGNOSIS — E782 Mixed hyperlipidemia: Secondary | ICD-10-CM | POA: Diagnosis not present

## 2020-07-09 DIAGNOSIS — Z136 Encounter for screening for cardiovascular disorders: Secondary | ICD-10-CM

## 2020-07-09 NOTE — Patient Instructions (Signed)

## 2020-07-11 ENCOUNTER — Encounter: Payer: Self-pay | Admitting: Internal Medicine

## 2020-07-11 NOTE — Progress Notes (Signed)
========================================================== -   Test results slightly outside the reference range are not unusual. If there is anything important, I will review this with you,  otherwise it is considered normal test values.  If you have further questions,  please do not hesitate to contact me at the office or via My Chart.  ==========================================================  -  Total Chol = 145 and LDL Chol = 74 - Both  Excellent  ! ! !  - Very low risk for Heart Attack  / Stroke ========================================================  -  Triglycerides (   209    ) or fats in blood are too high  (goal is less than 150)    - Recommend avoid fried & greasy foods,  sweets / candy,   - Avoid white rice  (brown or wild rice or Quinoa is OK),   - Avoid white potatoes  (sweet potatoes are OK)   - Avoid anything made from white flour  - bagels, doughnuts, rolls, buns, biscuits, white and   wheat breads, pizza crust and traditional  pasta made of white flour & egg white  - (vegetarian pasta or spinach or wheat pasta is OK).    - Multi-grain bread is OK - like multi-grain flat bread or  sandwich thins.   - Avoid alcohol in excess.   - Exercise is also important. ==========================================================  -  A1c - Normal - Great  - No Diabetes ! ==========================================================  -  Vitamin D = 65 - Excellent   - Vitamin D goal is between 60-110.   - It is very important as a natural anti-inflammatory and helping the  immune system protect against viral infections, like the Covid-19    helping hair, skin, and nails, as well as reducing stroke and  heart attack risk.   - It helps your bones and helps with mood.  - It also decreases numerous cancer risks so please  take it as directed.   - Low Vit D is associated with a 200-300% higher risk for  CANCER   and 200-300% higher risk for HEART   ATTACK  &   STROKE.    - It is also associated with higher death rate at younger ages,   autoimmune diseases like Rheumatoid arthritis, Lupus,  Multiple Sclerosis.     - Also many other serious conditions, like depression, Alzheimer's  Dementia, infertility, muscle aches, fatigue, fibromyalgia   - just to name a few. ==========================================================  - All Else - CBC - Kidneys - Electrolytes - Liver - Magnesium & Thyroid    - all  Normal / OK ===========================================================   - Keep up the [place] Work ! ===========================================================

## 2020-07-13 LAB — CBC WITH DIFFERENTIAL/PLATELET
Absolute Monocytes: 641 cells/uL (ref 200–950)
Basophils Absolute: 79 cells/uL (ref 0–200)
Basophils Relative: 1.1 %
Eosinophils Absolute: 223 cells/uL (ref 15–500)
Eosinophils Relative: 3.1 %
HCT: 48.4 % (ref 38.5–50.0)
Hemoglobin: 16.5 g/dL (ref 13.2–17.1)
Lymphs Abs: 1627 cells/uL (ref 850–3900)
MCH: 31.8 pg (ref 27.0–33.0)
MCHC: 34.1 g/dL (ref 32.0–36.0)
MCV: 93.3 fL (ref 80.0–100.0)
MPV: 10 fL (ref 7.5–12.5)
Monocytes Relative: 8.9 %
Neutro Abs: 4630 cells/uL (ref 1500–7800)
Neutrophils Relative %: 64.3 %
Platelets: 235 10*3/uL (ref 140–400)
RBC: 5.19 10*6/uL (ref 4.20–5.80)
RDW: 12.6 % (ref 11.0–15.0)
Total Lymphocyte: 22.6 %
WBC: 7.2 10*3/uL (ref 3.8–10.8)

## 2020-07-13 LAB — COMPLETE METABOLIC PANEL WITH GFR
AG Ratio: 2.1 (calc) (ref 1.0–2.5)
ALT: 35 U/L (ref 9–46)
AST: 26 U/L (ref 10–35)
Albumin: 4.4 g/dL (ref 3.6–5.1)
Alkaline phosphatase (APISO): 81 U/L (ref 35–144)
BUN: 17 mg/dL (ref 7–25)
CO2: 28 mmol/L (ref 20–32)
Calcium: 9.9 mg/dL (ref 8.6–10.3)
Chloride: 105 mmol/L (ref 98–110)
Creat: 0.8 mg/dL (ref 0.70–1.18)
GFR, Est African American: 102 mL/min/{1.73_m2} (ref >=60)
GFR, Est Non African American: 88 mL/min/{1.73_m2} (ref >=60)
Globulin: 2.1 g/dL (calc) (ref 1.9–3.7)
Glucose, Bld: 98 mg/dL (ref 65–99)
Potassium: 4.4 mmol/L (ref 3.5–5.3)
Sodium: 141 mmol/L (ref 135–146)
Total Bilirubin: 0.5 mg/dL (ref 0.2–1.2)
Total Protein: 6.5 g/dL (ref 6.1–8.1)

## 2020-07-13 LAB — MICROALBUMIN / CREATININE URINE RATIO
Creatinine, Urine: 118 mg/dL (ref 20–320)
Microalb Creat Ratio: 28 mcg/mg creat (ref <30)
Microalb, Ur: 3.3 mg/dL

## 2020-07-13 LAB — URINALYSIS, ROUTINE W REFLEX MICROSCOPIC
Bacteria, UA: NONE SEEN /HPF
Bilirubin Urine: NEGATIVE
Glucose, UA: NEGATIVE
Hgb urine dipstick: NEGATIVE
Hyaline Cast: NONE SEEN /LPF
Ketones, ur: NEGATIVE
Nitrite: NEGATIVE
Protein, ur: NEGATIVE
Specific Gravity, Urine: 1.02 (ref 1.001–1.03)
pH: 5.5 (ref 5.0–8.0)

## 2020-07-13 LAB — LIPID PANEL
Cholesterol: 145 mg/dL (ref <200)
HDL: 41 mg/dL (ref >=40)
LDL Cholesterol (Calc): 74 mg/dL (calc)
Non-HDL Cholesterol (Calc): 104 mg/dL (calc) (ref <130)
Total CHOL/HDL Ratio: 3.5 (calc) (ref <5.0)
Triglycerides: 209 mg/dL — ABNORMAL HIGH (ref <150)

## 2020-07-13 LAB — VITAMIN D 25 HYDROXY (VIT D DEFICIENCY, FRACTURES): Vit D, 25-Hydroxy: 65 ng/mL (ref 30–100)

## 2020-07-13 LAB — MAGNESIUM: Magnesium: 2.2 mg/dL (ref 1.5–2.5)

## 2020-07-13 LAB — HEMOGLOBIN A1C
Hgb A1c MFr Bld: 5.5 % of total Hgb (ref <5.7)
Mean Plasma Glucose: 111 mg/dL
eAG (mmol/L): 6.2 mmol/L

## 2020-07-13 LAB — TSH: TSH: 0.81 mIU/L (ref 0.40–4.50)

## 2020-07-13 LAB — INSULIN, RANDOM: Insulin: 36.2 u[IU]/mL — ABNORMAL HIGH

## 2020-07-16 DIAGNOSIS — Z23 Encounter for immunization: Secondary | ICD-10-CM | POA: Diagnosis not present

## 2020-07-20 ENCOUNTER — Other Ambulatory Visit: Payer: Self-pay | Admitting: Internal Medicine

## 2020-07-20 NOTE — Telephone Encounter (Signed)
Prescription refill request for Eliquis received.  Indication: Afib Last office visit: [facility], 05/04/2020 Scr: 0.80, 07/09/2020 Age: 75 yo Weight: 108 kg   Prescription refill sent.

## 2020-07-23 DIAGNOSIS — M5030 Other cervical disc degeneration, unspecified cervical region: Secondary | ICD-10-CM | POA: Diagnosis not present

## 2020-07-23 DIAGNOSIS — M4802 Spinal stenosis, cervical region: Secondary | ICD-10-CM | POA: Diagnosis not present

## 2020-08-05 DIAGNOSIS — M4722 Other spondylosis with radiculopathy, cervical region: Secondary | ICD-10-CM | POA: Diagnosis not present

## 2020-08-05 DIAGNOSIS — I10 Essential (primary) hypertension: Secondary | ICD-10-CM | POA: Diagnosis not present

## 2020-08-05 DIAGNOSIS — M542 Cervicalgia: Secondary | ICD-10-CM | POA: Diagnosis not present

## 2020-08-05 DIAGNOSIS — Z6831 Body mass index (BMI) 31.0-31.9, adult: Secondary | ICD-10-CM | POA: Diagnosis not present

## 2020-08-12 DIAGNOSIS — L821 Other seborrheic keratosis: Secondary | ICD-10-CM | POA: Diagnosis not present

## 2020-08-12 DIAGNOSIS — C44629 Squamous cell carcinoma of skin of left upper limb, including shoulder: Secondary | ICD-10-CM | POA: Diagnosis not present

## 2020-08-12 DIAGNOSIS — L570 Actinic keratosis: Secondary | ICD-10-CM | POA: Diagnosis not present

## 2020-08-12 DIAGNOSIS — D1801 Hemangioma of skin and subcutaneous tissue: Secondary | ICD-10-CM | POA: Diagnosis not present

## 2020-08-12 DIAGNOSIS — D485 Neoplasm of uncertain behavior of skin: Secondary | ICD-10-CM | POA: Diagnosis not present

## 2020-08-12 DIAGNOSIS — D225 Melanocytic nevi of trunk: Secondary | ICD-10-CM | POA: Diagnosis not present

## 2020-08-26 ENCOUNTER — Other Ambulatory Visit: Payer: Self-pay | Admitting: Internal Medicine

## 2020-08-26 ENCOUNTER — Other Ambulatory Visit: Payer: Self-pay | Admitting: Adult Health Nurse Practitioner

## 2020-09-01 DIAGNOSIS — R3915 Urgency of urination: Secondary | ICD-10-CM | POA: Diagnosis not present

## 2020-09-01 DIAGNOSIS — N5201 Erectile dysfunction due to arterial insufficiency: Secondary | ICD-10-CM | POA: Diagnosis not present

## 2020-09-01 DIAGNOSIS — R310 Gross hematuria: Secondary | ICD-10-CM | POA: Diagnosis not present

## 2020-10-08 ENCOUNTER — Encounter: Payer: Self-pay | Admitting: Adult Health Nurse Practitioner

## 2020-10-08 ENCOUNTER — Ambulatory Visit (INDEPENDENT_AMBULATORY_CARE_PROVIDER_SITE_OTHER): Payer: Medicare Other | Admitting: Adult Health Nurse Practitioner

## 2020-10-08 ENCOUNTER — Other Ambulatory Visit: Payer: Self-pay

## 2020-10-08 VITALS — BP 122/80 | HR 61 | Temp 97.5°F | Ht 72.0 in | Wt 237.0 lb

## 2020-10-08 DIAGNOSIS — K589 Irritable bowel syndrome without diarrhea: Secondary | ICD-10-CM

## 2020-10-08 DIAGNOSIS — E559 Vitamin D deficiency, unspecified: Secondary | ICD-10-CM

## 2020-10-08 DIAGNOSIS — Z0001 Encounter for general adult medical examination with abnormal findings: Secondary | ICD-10-CM | POA: Diagnosis not present

## 2020-10-08 DIAGNOSIS — R6889 Other general symptoms and signs: Secondary | ICD-10-CM

## 2020-10-08 DIAGNOSIS — I700 Atherosclerosis of aorta: Secondary | ICD-10-CM | POA: Diagnosis not present

## 2020-10-08 DIAGNOSIS — I480 Paroxysmal atrial fibrillation: Secondary | ICD-10-CM

## 2020-10-08 DIAGNOSIS — E669 Obesity, unspecified: Secondary | ICD-10-CM | POA: Diagnosis not present

## 2020-10-08 DIAGNOSIS — R7309 Other abnormal glucose: Secondary | ICD-10-CM

## 2020-10-08 DIAGNOSIS — R3914 Feeling of incomplete bladder emptying: Secondary | ICD-10-CM

## 2020-10-08 DIAGNOSIS — E66811 Obesity, class 1: Secondary | ICD-10-CM

## 2020-10-08 DIAGNOSIS — Z79899 Other long term (current) drug therapy: Secondary | ICD-10-CM | POA: Diagnosis not present

## 2020-10-08 DIAGNOSIS — N401 Enlarged prostate with lower urinary tract symptoms: Secondary | ICD-10-CM

## 2020-10-08 DIAGNOSIS — I10 Essential (primary) hypertension: Secondary | ICD-10-CM | POA: Diagnosis not present

## 2020-10-08 DIAGNOSIS — Z0000 Encounter for general adult medical examination without abnormal findings: Secondary | ICD-10-CM

## 2020-10-08 DIAGNOSIS — E782 Mixed hyperlipidemia: Secondary | ICD-10-CM | POA: Diagnosis not present

## 2020-10-08 DIAGNOSIS — Z85828 Personal history of other malignant neoplasm of skin: Secondary | ICD-10-CM | POA: Diagnosis not present

## 2020-10-08 NOTE — Progress Notes (Signed)
MEDICARE ANNUAL WELLNESS VISIT AND FOLLOW UP  Assessment:   Encounter for annual wellness medicare visit Yearly  Essential hypertension - continue medications, DASH diet, exercise and monitor at home. Call if greater than 130/80.  -     CBC with Differential/Platelet -     CMP/GFR -     TSH  Paroxysmal atrial fibrillation (HCC) NSR at this time, following Dr. Rayann Heman, continue elequis 5mg  BID  Mixed hyperlipidemia Continue medications: rosuvastatin20mg  Discussed dietary and exercise modifications Low fat diet -     Lipid panel   Aortic atherosclerosis ([place]) Per CT 10/11/17 Control blood pressure, cholesterol, glucose, increase exercise.  Other abnormal glucose Discussed general issues about diabetes pathophysiology and management., Educational material distributed., Suggested low cholesterol diet., Encouraged aerobic exercise., Discussed foot care., Reminded to get yearly retinal exam.  Vitamin D deficiency Continue supplement  Benign prostatic hyperplasia with urinary frequency Continue follow up urology - Dr. Tresa Moore  Irritable bowel syndrome, unspecified type Diet discussed  Obesity BMI - long discussion about weight loss, diet, and exercise  History of SCC (squamous cell carcinoma) of skin Follow up DERM   Medication management -     Magnesium   Further disposition pending results if labs check today. Discussed med's effects and SE's.   Over 30 minutes of face to face interview, exam, counseling, chart review, and critical decision making was performed.    Future Appointments  Date Time Provider [place]  05/03/2021 12:15 PM Thompson Grayer, MD [facility]  07/28/2021 11:00 AM Unk Pinto, MD [facility] None  10/08/2021 11:00 AM Liane Comber, NP [facility] None     Plan:   During the course of the visit the patient was educated and counseled about appropriate screening and preventive services including:    Pneumococcal vaccine    Influenza vaccine  Td vaccine  Screening electrocardiogram  Colorectal cancer screening  Diabetes screening  Glaucoma screening  Nutrition counseling    Subjective:  [name] is a 75 y.o. male who presents for Medicare Annual Wellness Visit and 3 month follow up for HTN, HLD, and vitamin D Def.     He go this first Moderna shot 08/10/19 and second shot 09/09/19, with subsquent vertigo and irtractable headache. He had normal CT head on 10/04/2019 and MRI brain on 10/28/2019.  He followed up with Dr. Domingo Cocking and has been doing better.   He has history of Afib He is on tambocor, he did go back on the Eliquis due to his vertigo and headaches.   He had right cataract surgery , Due for follow up He has rash where he shaves.   He has lump on his leg that he wants looked at.   Follows with Dr. Tresa Moore for BPH just had OV last week, no changes.  He is in with Dr. Archie Endo office for left knee PT.  Riding bike 30-66min. Reports replacement is inevitable, managing at this time.    BMI is Body mass index is 32.14 kg/m., he has not been working on diet.  Wt Readings from Last 3 Encounters:  10/08/20 237 lb (107.5 kg)  07/09/20 238 lb 3.2 oz (108 kg)  05/04/20 231 lb (104.8 kg)   His blood pressure has been controlled at home, today their BP is BP: 122/80 He does workout. He denies chest pain, shortness of breath, dizziness.   He is on cholesterol medication, on rosuvastatin 20 mg daily, has increased this and denies myalgias. His cholesterol is at goal. The cholesterol last visit  was:   Lab Results  Component Value Date   CHOL 145 07/09/2020   HDL 41 07/09/2020   LDLCALC 74 07/09/2020   TRIG 209 (H) 07/09/2020   CHOLHDL 3.5 07/09/2020    Had elevated A1C in 2013, has done well with diet/exercise. Last A1C in the office was:  Lab Results  Component Value Date   HGBA1C 5.5 07/09/2020   Patient is on Vitamin D supplement.   Lab Results  Component Value Date    VD25OH 65 07/09/2020      Medication Review:   Current Outpatient Medications (Cardiovascular):  .  diltiazem (CARDIZEM) 30 MG tablet, Take 1 tablet every 4 hours AS NEEDED for AFIB heart rate >100 as long as blood pressure >100. .  flecainide (TAMBOCOR) 100 MG tablet, TAKE 1 TABLET BY MOUTH  TWICE DAILY .  losartan (COZAAR) 50 MG tablet, Take     1 tablet     Daily      for BP .  rosuvastatin (CRESTOR) 20 MG tablet, Take 1 tablet (20 mg total) by mouth daily. (Patient taking differently: Take 10 mg by mouth daily. Taking 1/2 tablet daily) .  verapamil (CALAN-SR) 120 MG CR tablet, Take    1 tablet     Daily     with Food    for BP & Heart Rhythm    Current Outpatient Medications (Hematological):  Marland Kitchen  ELIQUIS 5 MG TABS tablet, TAKE 1 TABLET BY MOUTH  TWICE DAILY  Current Outpatient Medications (Other):  Marland Kitchen  ALPRAZolam (XANAX) 1 MG tablet, TAKE 1/2 TO 1 TABLET BY  MOUTH AT BEDTIME IF NEED  FOR SLEEP (TRY TO LIMIT TO  5 DAYS/WEEK TO AVOID  ADDICTION AND DEMENTIA) .  Carboxymethylcellulose Sodium (REFRESH TEARS OP), Place 1 drop into both eyes daily as needed (dry eyes).  .  Cholecalciferol (VITAMIN D3) 5000 units CAPS, Take 10,000 Units by mouth daily.  .  finasteride (PROSCAR) 5 MG tablet, Take 5 mg by mouth daily. .  Flaxseed, Linseed, (FLAX SEED OIL PO), Take 1 capsule by mouth daily. .  Multiple Vitamin (MULTIVITAMIN) tablet, Take 1 tablet by mouth daily. .  Omega-3 Fatty Acids (FISH OIL PO), Take 1 capsule by mouth 2 (two) times daily. .  vitamin C (ASCORBIC ACID) 500 MG tablet, Take 500 mg by mouth daily. Marland Kitchen  zinc gluconate 50 MG tablet, Take 50 mg by mouth daily.  Current Problems (verified) Patient Active Problem List   Diagnosis Date Noted  . BPH (benign prostatic hyperplasia) 05/22/2019  . Insomnia 08/09/2018  . HNP (herniated nucleus pulposus), cervical 07/16/2018  . Aortic atherosclerosis ([place]) by Chest CT scan on 10/11/2017 10/11/2017  . Arthritis of carpometacarpal [facility])  joint of left thumb 07/27/2017  . Chronic pain of left thumb 07/27/2017  . History of SCC (squamous cell carcinoma) of skin 10/04/2016  . Atrial fibrillation ([place]) 08/01/2016  . Obesity (BMI 30.0-34.9) 09/30/2013  . Essential hypertension   . Hyperlipidemia, mixed   . BPH with obstruction/lower urinary tract symptoms   . Abnormal glucose   . IBS (irritable bowel syndrome)   . Vitamin D deficiency     Screening Tests Immunization History  Administered Date(s) Administered  . DTaP 07/11/2005  . Influenza Whole 04/02/2013  . Influenza, High Dose Seasonal PF 05/01/2014, 05/21/2015, 03/29/2016, 05/31/2017, 05/05/2018, 05/01/2019  . Moderna Sars-Covid-2 Vaccination 08/10/2019, 09/09/2019  . Pneumococcal Conjugate-13 05/01/2014  . Pneumococcal Polysaccharide-23 09/08/2008, 03/29/2016  . Td 12/16/2015  . Zoster 12/09/2008   Health Maintenance  Topic Date Due  . INFLUENZA VACCINE  02/09/2020  . COVID-19 Vaccine (3 - Booster for Moderna series) 03/11/2020  . Fecal DNA (Cologuard)  05/21/2021  . TETANUS/TDAP  12/15/2025  . Hepatitis C Screening  Completed  . PNA vac Low Risk Adult  Completed  . HPV VACCINES  Aged Out    Preventative care: Last colonoscopy: 2008 Dr. Deatra Ina  Cologuard 05/2018 due 2022 Stress test: 2018 Echo 08/2016  CXR 09/2016  Prior vaccinations: TD or Tdap: 2017 Influenza: 2018 Pneumococcal: 2010, 2017 Prevnar 13: 2015 Shingles/Zostavax: 2010  Names of Other Physician/Practitioners you currently use: 1. [facility] here for primary care 2. Dr. Satira Sark, eye doctor, visit yearly 2021, Due 3. Dr. Jolayne Panther, dentist, q 6 months 2022  Patient Care Team: Unk Pinto, MD as PCP - General (Internal Medicine) Thompson Grayer, MD as PCP - Cardiology (Cardiology) Charlotte Crumb, MD as Consulting Physician (Orthopedic Surgery) Inda Castle, MD (Inactive) as Consulting Physician (Gastroenterology) Alexis Frock, MD as  Consulting Physician (Urology) Thompson Grayer, MD as Consulting Physician (Cardiology)   History reviewed: allergies, current medications, past family history, past medical history, past social history, past surgical history and problem list  Allergies Allergies  Allergen Reactions  . Xarelto [Rivaroxaban] Other (See Comments)    Severe body aches  . Atorvastatin Other (See Comments)    Myalgias  . Clonazepam Other (See Comments)    "Made me jittery"  . Tamsulosin Hcl Other (See Comments)    Hypotension    SURGICAL HISTORY He  has a past surgical history that includes Appendectomy (02-07-2007); Anterior cervical decomp/discectomy fusion (01-25-2002); LEFT INGUINAL HERNIA REPAIR (1970); Umbilical hernia repair (2003); LEFT ROTATOR CUFF REPAIR (2002); Anterior cruciate ligament repair (1990); Cervical fusion (2000); LEFT ANKLE SURG. (1980); Hip surgery (1999); Colonoscopy (05-30-2011); Tonsillectomy; Trigger finger release (Right, 07/17/2013); Tendon reconstruction (Left, 07/17/2013); LOOP RECORDER INSERTION (N/A, 09/19/2016); Hernia repair; Anterior cervical decomp/discectomy fusion (Bilateral, 07/16/2018); Carpal tunnel release (Right, 07/16/2018); XI robotic assisted simple prostatectomy (N/A, 05/22/2019); and Cataract extraction (Right, 01/2020). FAMILY HISTORY His family history includes Alcohol abuse in his father; Diabetes in his father; Heart attack (age of onset: 66) in his father; Heart disease in his father and mother; Stroke (age of onset: 72) in his mother. SOCIAL HISTORY He  reports that he has never smoked. He has never used smokeless tobacco. He reports current alcohol use of about 4.0 standard drinks of alcohol per week. He reports that he does not use drugs.  MEDICARE WELLNESS OBJECTIVES: Physical activity: Current Exercise Habits: Home exercise routine, Time (Minutes): 30, Frequency (Times/Week): 7, Weekly Exercise (Minutes/Week): 210, Intensity: Moderate Cardiac risk factors:  Cardiac Risk Factors include: advanced age (>66men, >81 women);hypertension;male gender;obesity (BMI >30kg/m2) Depression/mood screen:   Depression screen [facility] 2/9 10/08/2020  Decreased Interest 0  Down, Depressed, Hopeless 0  PHQ - 2 Score 0  Some recent data might be hidden    ADLs:  In your present state of health, do you have any difficulty performing the following activities: 10/08/2020 07/09/2020  Hearing? N N  Vision? N N  Comment - -  Difficulty concentrating or making decisions? - N  Walking or climbing stairs? N N  Dressing or bathing? N N  Doing errands, shopping? N N  Preparing Food and eating ? N -  Using the Toilet? N -  In the past six months, have you accidently leaked urine? N -  Do you have problems with loss of bowel control? N -  Managing  your Medications? N -  Managing your Finances? N -  Housekeeping or managing your Housekeeping? N -  Some recent data might be hidden     Cognitive Testing  Alert? Yes  Normal Appearance?Yes  Oriented to person? Yes  Place? Yes   Time? Yes  Recall of three objects?  Yes  Can perform simple calculations? Yes  Displays appropriate judgment?Yes  Can read the correct time from a watch face?Yes  EOL planning: Does Patient Have a Medical Advance Directive?: Yes Type of Advance Directive: [place] will Does patient want to make changes to medical advance directive?: No - Patient declined Copy of [place] in Chart?: No - copy requested   Objective:   Blood pressure 122/80, pulse 61, temperature (!) 97.5 F (36.4 C), height 6' (1.829 m), weight 237 lb (107.5 kg), SpO2 96 %. Body mass index is 32.14 kg/m.  General appearance: alert, no distress, WD/WN, male HEENT: normocephalic, sclerae anicteric, TMs pearly, nares patent, no discharge or erythema, pharynx normal Oral cavity: MMM, no lesions Neck: supple, no lymphadenopathy, no thyromegaly, no masses Heart: RRR, normal S1, S2,  no murmurs Lungs: CTA bilaterally, no wheezes, rhonchi, or rales Abdomen: +bs, soft, non tender, non distended, no masses, no hepatomegaly, no splenomegaly Musculoskeletal: nontender, right knee with obvious bony enlargement no effusion, no erythema. Extremities: no edema, no cyanosis, no clubbing Pulses: 2+ symmetric, upper and lower extremities, normal cap refill Neurological: alert, oriented x 3, CN2-12 intact, strength normal upper extremities and lower extremities, sensation normal throughout, DTRs 2+ throughout, no cerebellar signs, gait antalgic Skin: left anterior shin with 3 cm erythematous, warm nodule with punctate on it.  Psychiatric: normal affect, behavior normal, pleasant    Medicare Attestation I have personally reviewed: The patient's medical and social history Their use of alcohol, tobacco or illicit drugs Their current medications and supplements The patient's functional ability including ADLs,fall risks, home safety risks, cognitive, and hearing and visual impairment Diet and physical activities Evidence for depression or mood disorders  The patient's weight, height, BMI, and visual acuity have been recorded in the chart.  I have made referrals, counseling, and provided education to the patient based on review of the above and I have provided the patient with a written personalized care plan for preventive services.     Garnet Sierras, NP   10/08/2020

## 2020-10-09 LAB — CBC WITH DIFFERENTIAL/PLATELET
Absolute Monocytes: 718 cells/uL (ref 200–950)
Basophils Absolute: 86 cells/uL (ref 0–200)
Basophils Relative: 1.1 %
Eosinophils Absolute: 281 cells/uL (ref 15–500)
Eosinophils Relative: 3.6 %
HCT: 48 % (ref 38.5–50.0)
Hemoglobin: 16.3 g/dL (ref 13.2–17.1)
Lymphs Abs: 1981 cells/uL (ref 850–3900)
MCH: 32 pg (ref 27.0–33.0)
MCHC: 34 g/dL (ref 32.0–36.0)
MCV: 94.3 fL (ref 80.0–100.0)
MPV: 10.1 fL (ref 7.5–12.5)
Monocytes Relative: 9.2 %
Neutro Abs: 4735 cells/uL (ref 1500–7800)
Neutrophils Relative %: 60.7 %
Platelets: 238 10*3/uL (ref 140–400)
RBC: 5.09 10*6/uL (ref 4.20–5.80)
RDW: 13 % (ref 11.0–15.0)
Total Lymphocyte: 25.4 %
WBC: 7.8 10*3/uL (ref 3.8–10.8)

## 2020-10-09 LAB — LIPID PANEL
Cholesterol: 152 mg/dL (ref <200)
HDL: 46 mg/dL (ref >=40)
LDL Cholesterol (Calc): 84 mg/dL (calc)
Non-HDL Cholesterol (Calc): 106 mg/dL (calc) (ref <130)
Total CHOL/HDL Ratio: 3.3 (calc) (ref <5.0)
Triglycerides: 122 mg/dL (ref <150)

## 2020-10-09 LAB — COMPLETE METABOLIC PANEL WITH GFR
AG Ratio: 2.1 (calc) (ref 1.0–2.5)
ALT: 31 U/L (ref 9–46)
AST: 24 U/L (ref 10–35)
Albumin: 4.6 g/dL (ref 3.6–5.1)
Alkaline phosphatase (APISO): 77 U/L (ref 35–144)
BUN: 14 mg/dL (ref 7–25)
CO2: 26 mmol/L (ref 20–32)
Calcium: 10.1 mg/dL (ref 8.6–10.3)
Chloride: 103 mmol/L (ref 98–110)
Creat: 0.83 mg/dL (ref 0.70–1.18)
GFR, Est African American: 100 mL/min/{1.73_m2} (ref >=60)
GFR, Est Non African American: 87 mL/min/{1.73_m2} (ref >=60)
Globulin: 2.2 g/dL (calc) (ref 1.9–3.7)
Glucose, Bld: 84 mg/dL (ref 65–99)
Potassium: 4.4 mmol/L (ref 3.5–5.3)
Sodium: 141 mmol/L (ref 135–146)
Total Bilirubin: 0.5 mg/dL (ref 0.2–1.2)
Total Protein: 6.8 g/dL (ref 6.1–8.1)

## 2020-10-27 ENCOUNTER — Other Ambulatory Visit: Payer: Self-pay | Admitting: Internal Medicine

## 2020-11-05 ENCOUNTER — Telehealth: Payer: Self-pay | Admitting: *Deleted

## 2020-11-05 DIAGNOSIS — M25562 Pain in left knee: Secondary | ICD-10-CM | POA: Diagnosis not present

## 2020-11-05 DIAGNOSIS — M1711 Unilateral primary osteoarthritis, right knee: Secondary | ICD-10-CM | POA: Diagnosis not present

## 2020-11-05 NOTE — Telephone Encounter (Signed)
   [facility] Pre-operative Risk Assessment    Patient Name: [name]  DOB: Jun 10, 1946  MRN: [id number]   HEARTCARE STAFF: - Please ensure there is not already an duplicate clearance open for this procedure. - Under Visit Info/Reason for Call, type in Other and utilize the format Clearance MM/DD/YY or Clearance TBD. Do not use dashes or single digits. - If request is for dental extraction, please clarify the # of teeth to be extracted.  Request for surgical clearance:  1. What type of surgery is being performed? RIGHT TOTAL KNEE REPLACEMENT w/HARDWARE REMOVAL  2. When is this surgery scheduled? TBD (SEPT OR OCT)  3. What type of clearance is required (medical clearance vs. Pharmacy clearance to hold med vs. Both)? BOTH  4. Are there any medications that need to be held prior to surgery and how long? ELIQUIS   5. Practice name and name of physician performing surgery? [name]; DR. Elsie Saas   6. What is the office phone number? [phone] EXT [phone]   7.   What is the office fax number? [place].   Anesthesia type (None, local, MAC, general) ? CHOICE   Julaine Hua 11/05/2020, 5:10 PM  _________________________________________________________________   (provider comments below)

## 2020-11-06 NOTE — Telephone Encounter (Signed)
Patient with diagnosis of afib on Eliquis for anticoagulation.    Procedure: RIGHT TOTAL KNEE REPLACEMENT w/HARDWARE REMOVAL Date of procedure: TBD  CHA2DS2-VASc Score = 2  This indicates a 2.2% annual risk of stroke. The patient's score is based upon: CHF History: No HTN History: Yes Diabetes History: No Stroke History: No Vascular Disease History: No Age Score: 1 Gender Score: 0     CrCl 98 ml/min Platelet count 238  Per office protocol, patient can hold Eliquis for 3 days prior to procedure.    Patient's procedure is several months away. If his clinical picture changes, then clearance will need to reviewed.

## 2020-11-06 NOTE — Telephone Encounter (Signed)
Primary Cardiologist:James Allred, MD  Chart reviewed as part of pre-operative protocol coverage. Because of [name]'s past medical history and time since last visit, he/she will require a follow-up visit in order to better assess preoperative cardiovascular risk.  Pre-op covering staff: - Please schedule appointment and call patient to inform them. - Please contact requesting surgeon's office via preferred method (i.e, phone, fax) to inform them of need for appointment prior to surgery.  If applicable, this message will also be routed to pharmacy pool and/or primary cardiologist for input on holding anticoagulant/antiplatelet agent as requested below so that this information is available at time of patient's appointment.   Deberah Pelton, NP  11/06/2020, 8:04 AM

## 2020-11-06 NOTE — Telephone Encounter (Signed)
Message sent to EP scheduler to see if we could move appt a little sooner for pre op clearance, maybe sometime in August 2022.

## 2020-11-06 NOTE — Telephone Encounter (Signed)
Per Doylene Canning she has left a message for the pt to call back to schedule a sooner appt for pre op. See previous note.

## 2020-11-09 DIAGNOSIS — M47816 Spondylosis without myelopathy or radiculopathy, lumbar region: Secondary | ICD-10-CM | POA: Diagnosis not present

## 2020-11-10 NOTE — Telephone Encounter (Signed)
Pt has appt with Dr. Rayann Heman 02/22/21 , will assess for pre op clearance at that time. I will send note to MD for upcoming appt. Will send FYI to requesting office pt has appt 02/22/21.

## 2020-11-12 DIAGNOSIS — M25521 Pain in right elbow: Secondary | ICD-10-CM | POA: Diagnosis not present

## 2020-11-12 DIAGNOSIS — S53411D Radiohumeral (joint) sprain of right elbow, subsequent encounter: Secondary | ICD-10-CM | POA: Diagnosis not present

## 2020-11-16 DIAGNOSIS — M25521 Pain in right elbow: Secondary | ICD-10-CM | POA: Diagnosis not present

## 2020-11-16 DIAGNOSIS — S53411D Radiohumeral (joint) sprain of right elbow, subsequent encounter: Secondary | ICD-10-CM | POA: Diagnosis not present

## 2020-11-18 DIAGNOSIS — S53411D Radiohumeral (joint) sprain of right elbow, subsequent encounter: Secondary | ICD-10-CM | POA: Diagnosis not present

## 2020-11-18 DIAGNOSIS — M25521 Pain in right elbow: Secondary | ICD-10-CM | POA: Diagnosis not present

## 2020-11-24 DIAGNOSIS — M25521 Pain in right elbow: Secondary | ICD-10-CM | POA: Diagnosis not present

## 2020-11-24 DIAGNOSIS — S53411D Radiohumeral (joint) sprain of right elbow, subsequent encounter: Secondary | ICD-10-CM | POA: Diagnosis not present

## 2020-11-27 DIAGNOSIS — S53411D Radiohumeral (joint) sprain of right elbow, subsequent encounter: Secondary | ICD-10-CM | POA: Diagnosis not present

## 2020-11-27 DIAGNOSIS — M25521 Pain in right elbow: Secondary | ICD-10-CM | POA: Diagnosis not present

## 2020-12-29 NOTE — Telephone Encounter (Signed)
OUR OFFICE RECEIVED A DUPLICATE REQUEST FOR PT'S SURGERY WITH DR. Noemi Chapel IN EITHER SEPT OR October 2022 PER CLEARANCE REQUEST. NOTES WERE FAXED TO SURGEON'S OFFICE BACK IN April THAT THE PT HAS APPT WITH HIS CARDIOLOGIST IN 02/2021 WHICH AT THAT TIME MD WILL ASSESS PT FOR PRE OP  CLEARANCE. ONCE THE PT HAS BEEN CLEARED WE WILL SEND CLEARANCE TO DR. Noemi Chapel. THANK YOU FOR YOUR TIME.

## 2021-01-10 ENCOUNTER — Other Ambulatory Visit: Payer: Self-pay | Admitting: Internal Medicine

## 2021-01-10 DIAGNOSIS — I480 Paroxysmal atrial fibrillation: Secondary | ICD-10-CM

## 2021-01-12 NOTE — Telephone Encounter (Signed)
Eliquis 5mg  refill request received. Patient is 75 years old, weight-107.5kg, Crea-0.83 on 10/08/20, Diagnosis-Afib, and last seen by Dr. Rayann Heman on 05/04/2020. Dose is appropriate based on dosing criteria. Will send in refill to requested pharmacy.

## 2021-01-23 ENCOUNTER — Other Ambulatory Visit: Payer: Self-pay | Admitting: Internal Medicine

## 2021-01-23 DIAGNOSIS — E782 Mixed hyperlipidemia: Secondary | ICD-10-CM

## 2021-01-23 MED ORDER — ROSUVASTATIN CALCIUM 10 MG PO TABS: ORAL_TABLET | ORAL | 3 refills | Status: DC

## 2021-01-25 ENCOUNTER — Other Ambulatory Visit: Payer: Self-pay

## 2021-01-25 DIAGNOSIS — E782 Mixed hyperlipidemia: Secondary | ICD-10-CM

## 2021-01-25 MED ORDER — ROSUVASTATIN CALCIUM 10 MG PO TABS: ORAL_TABLET | ORAL | 3 refills | Status: DC

## 2021-01-28 ENCOUNTER — Other Ambulatory Visit: Payer: Self-pay | Admitting: Internal Medicine

## 2021-01-28 DIAGNOSIS — E782 Mixed hyperlipidemia: Secondary | ICD-10-CM

## 2021-01-28 MED ORDER — ROSUVASTATIN CALCIUM 10 MG PO TABS: ORAL_TABLET | ORAL | 3 refills | Status: DC

## 2021-02-04 DIAGNOSIS — M47816 Spondylosis without myelopathy or radiculopathy, lumbar region: Secondary | ICD-10-CM | POA: Diagnosis not present

## 2021-02-08 DIAGNOSIS — Z85828 Personal history of other malignant neoplasm of skin: Secondary | ICD-10-CM | POA: Diagnosis not present

## 2021-02-08 DIAGNOSIS — L245 Irritant contact dermatitis due to other chemical products: Secondary | ICD-10-CM | POA: Diagnosis not present

## 2021-02-08 DIAGNOSIS — L570 Actinic keratosis: Secondary | ICD-10-CM | POA: Diagnosis not present

## 2021-02-17 DIAGNOSIS — M47816 Spondylosis without myelopathy or radiculopathy, lumbar region: Secondary | ICD-10-CM | POA: Diagnosis not present

## 2021-02-22 ENCOUNTER — Ambulatory Visit (INDEPENDENT_AMBULATORY_CARE_PROVIDER_SITE_OTHER): Payer: Medicare Other | Admitting: Internal Medicine

## 2021-02-22 ENCOUNTER — Other Ambulatory Visit: Payer: Self-pay | Admitting: Internal Medicine

## 2021-02-22 VITALS — BP 124/76 | HR 70 | Ht 72.0 in | Wt 236.0 lb

## 2021-02-22 DIAGNOSIS — I4892 Unspecified atrial flutter: Secondary | ICD-10-CM | POA: Diagnosis not present

## 2021-02-22 DIAGNOSIS — I10 Essential (primary) hypertension: Secondary | ICD-10-CM | POA: Diagnosis not present

## 2021-02-22 DIAGNOSIS — I480 Paroxysmal atrial fibrillation: Secondary | ICD-10-CM | POA: Diagnosis not present

## 2021-02-22 DIAGNOSIS — E785 Hyperlipidemia, unspecified: Secondary | ICD-10-CM | POA: Diagnosis not present

## 2021-02-22 MED ORDER — ALPRAZOLAM 1 MG PO TABS: ORAL_TABLET | ORAL | 0 refills | Status: DC

## 2021-02-22 NOTE — Progress Notes (Signed)
PCP: Unk Pinto, MD   Primary EP: Dr Coolidge Breeze is a 75 y.o. male who presents today for routine electrophysiology followup.  Since last being seen in our clinic, the patient reports doing very well.  His primary concern is with knee pain.  He is considering knee surgery. He denies afib.  He exercises on a recumbent bike twice per week for 40 minutes without limitation. Today, he denies symptoms of palpitations, chest pain, shortness of breath,  lower extremity edema, dizziness, presyncope, or syncope.  The patient is otherwise without complaint today.   Past Medical History:  Diagnosis Date   A-fib [facility])    Arthritis    Benign prostatic hyperplasia    BPH (benign prostatic hypertrophy)    Carpal tunnel syndrome, bilateral    Early cataract    History of SCC (squamous cell carcinoma) of skin 10/04/2016   HNP (herniated nucleus pulposus), cervical    Hyperlipidemia    Hypertension 2007   IBS (irritable bowel syndrome)    Mild acid reflux occasional   Obesity    Other abnormal glucose    Pneumonia    " walking"   Pre-diabetes    Vitamin D deficiency    Wears glasses    Past Surgical History:  Procedure Laterality Date   ANTERIOR CERVICAL DECOMP/DISCECTOMY FUSION  01-25-2002   C4 - C5   ANTERIOR CERVICAL DECOMP/DISCECTOMY FUSION Bilateral 07/16/2018   Procedure: REMOVAL OF ANTERIOR CERVICAL PLATE, ANTERIOR CERVICAL DECOMPRESSION/DISCECTOMY FUSION CERVICAL THREE- CERVICAL FOUR;  Surgeon: Jovita Gamma, MD;  Location: [place];  Service: Neurosurgery;  Laterality: Bilateral;  REMOVAL OF ANTERIOR CERVICAL PLATE, ANTERIOR CERVICAL DECOMPRESSION/DISCECTOMY FUSION CERVICAL THREE- CERVICAL FOUR   ANTERIOR CRUCIATE LIGAMENT REPAIR  1990   RIGHT   APPENDECTOMY  02-07-2007   CARPAL TUNNEL RELEASE Right 07/16/2018   Procedure: CARPAL TUNNEL RELEASE;  Surgeon: Jovita Gamma, MD;  Location: [place];  Service: Neurosurgery;  Laterality: Right;  CARPAL TUNNEL RELEASE    CATARACT EXTRACTION Right 01/2020   CERVICAL FUSION  2000   C5 - 6   COLONOSCOPY  05-30-2011   HEMORRHOIDS   HERNIA REPAIR     HIP SURGERY  1999   LEFT HIP --  REMOVAL CALCIUM BUILD-UP   LEFT ANKLE SURG.  [place]   LEFT ROTATOR CUFF REPAIR  2002   LOOP RECORDER INSERTION N/A 09/19/2016   Procedure: Loop Recorder Insertion;  Surgeon: Thompson Grayer, MD;  Location: [facility] [facility];  Service: Cardiovascular;  Laterality: N/A;   TENDON RECONSTRUCTION Left 07/17/2013   Procedure: LEFT ELBOW LATERAL RECONSTRUCTION;  Surgeon: Schuyler Amor, MD;  Location: [place];  Service: Orthopedics;  Laterality: Left;   TONSILLECTOMY     TRIGGER FINGER RELEASE Right 07/17/2013   Procedure: RELEASE A-1 PULLEY RIGHT RING FINGER;  Surgeon: Schuyler Amor, MD;  Location: [place] [place];  Service: Orthopedics;  Laterality: Right;   UMBILICAL HERNIA REPAIR  2003   XI ROBOTIC ASSISTED SIMPLE PROSTATECTOMY N/A 05/22/2019   Procedure: XI ROBOTIC ASSISTED SIMPLE PROSTATECTOMY;  Surgeon: Alexis Frock, MD;  Location: [facility];  Service: Urology;  Laterality: N/A;  3 HRS    ROS- all systems are reviewed and negatives except as per HPI above  Current Outpatient Medications  Medication Sig Dispense Refill   ALPRAZolam (XANAX) 1 MG tablet TAKE 1/2 TO 1 TABLET BY  MOUTH AT BEDTIME IF NEED  FOR SLEEP (TRY TO LIMIT TO  5 DAYS/WEEK TO AVOID  ADDICTION AND DEMENTIA) 90 tablet 0   apixaban (ELIQUIS) 5 MG TABS tablet TAKE 1 TABLET BY MOUTH  TWICE DAILY 180 tablet 1   Carboxymethylcellulose Sodium (REFRESH TEARS OP) Place 1 drop into both eyes daily as needed (dry eyes).      Cholecalciferol (VITAMIN D3) 5000 units CAPS Take 10,000 Units by mouth daily.      diltiazem (CARDIZEM) 30 MG tablet Take 1 tablet every 4 hours AS NEEDED for AFIB heart rate >100 as long as blood pressure >100. 45 tablet 1   finasteride (PROSCAR) 5 MG tablet Take 5 mg by mouth daily.      Flaxseed, Linseed, (FLAX SEED OIL PO) Take 1 capsule by mouth daily.     flecainide (TAMBOCOR) 100 MG tablet TAKE 1 TABLET BY MOUTH  TWICE DAILY 180 tablet 2   losartan (COZAAR) 50 MG tablet TAKE 1 TABLET BY MOUTH  DAILY FOR BLOOD PRESSURE 90 tablet 3   Multiple Vitamin (MULTIVITAMIN) tablet Take 1 tablet by mouth daily.     Omega-3 Fatty Acids (FISH OIL PO) Take 1 capsule by mouth 2 (two) times daily.     rosuvastatin (CRESTOR) 10 MG tablet Take  1 tablet  Daily  for Cholesterol 90 tablet 3   verapamil (CALAN-SR) 120 MG CR tablet Take    1 tablet     Daily     with Food    for BP & Heart Rhythm 90 tablet 3   vitamin C (ASCORBIC ACID) 500 MG tablet Take 500 mg by mouth daily.     zinc gluconate 50 MG tablet Take 50 mg by mouth daily.     No current facility-administered medications for this visit.    Physical Exam: There were no vitals filed for this visit.  GEN- The patient is well appearing, alert and oriented x 3 today.   Head- normocephalic, atraumatic Eyes-  Sclera clear, conjunctiva pink Ears- hearing intact Oropharynx- clear Lungs- Clear to ausculation bilaterally, normal work of breathing Heart- Regular rate and rhythm, no murmurs, rubs or gallops, PMI not laterally displaced GI- soft, NT, ND, + BS Extremities- no clubbing, cyanosis, or edema  Wt Readings from Last 3 Encounters:  10/08/20 237 lb (107.5 kg)  07/09/20 238 lb 3.2 oz (108 kg)  05/04/20 231 lb (104.8 kg)    EKG tracing ordered today is personally reviewed and shows sinus  Assessment and Plan:  Paroxysmal atrial fibrillation/ atrial flutter Doing well ILR no longer functional.  We discussed today.  He does not wish to have his device removed at this time.  He will contact my office if he changes his mind. Chads2vasc score is 3.  Continue eliquis  2. HTN Stable No change required today Continue losartan '50mg'$  daily  3. HL Continue crestor '10mg'$  daily  Risks, benefits and potential toxicities for  medications prescribed and/or refilled reviewed with patient today.   4. Preop He is considering knee surgery Ok to proceed without further CV testing. Hold eliquis 24-48 hours prior to surgery and resume as soon as able post operatively.  Return to see EP APP in a year  Thompson Grayer MD, [facility] 02/22/2021 11:06 AM

## 2021-02-22 NOTE — Patient Instructions (Addendum)
Medication Instructions:  Your physician recommends that you continue on your current medications as directed. Please refer to the Current Medication list given to you today.  Labwork: None ordered.  Testing/Procedures: None ordered.  Follow-Up: Your physician wants you to follow-up in: 12 months with  James Allred, MD or one of the following Advanced Practice Providers on your designated Care Team:    Renee Ursuy, PA-C    You will receive a reminder letter in the mail two months in advance. If you don't receive a letter, please call our office to schedule the follow-up appointment.   Any Other Special Instructions Will Be Listed Below (If Applicable).  If you need a refill on your cardiac medications before your next appointment, please call your pharmacy.        

## 2021-03-10 ENCOUNTER — Ambulatory Visit: Payer: Medicare Other | Admitting: Adult Health Nurse Practitioner

## 2021-03-10 ENCOUNTER — Ambulatory Visit: Payer: Medicare Other | Admitting: Nurse Practitioner

## 2021-03-18 DIAGNOSIS — M5416 Radiculopathy, lumbar region: Secondary | ICD-10-CM | POA: Diagnosis not present

## 2021-03-18 DIAGNOSIS — M47816 Spondylosis without myelopathy or radiculopathy, lumbar region: Secondary | ICD-10-CM | POA: Diagnosis not present

## 2021-03-22 NOTE — Progress Notes (Signed)
FOLLOW UP  Assessment and Plan:   Hypertension Well controlled with current medications  Monitor blood pressure at home; patient to call if consistently greater than 130/80 Continue DASH diet.   Reminder to go to the ER if any CP, SOB, nausea, dizziness, severe HA, changes vision/speech, left arm numbness and tingling and jaw pain.  Cholesterol Currently at goal;  Continue low cholesterol diet and exercise.  Check lipid panel.   Abnormal Glucose Continue medication: Continue diet and exercise.  Check A1C  Paroxysmal Atrial Fibrillation NSR at this time, following with Dr. Rayann Heman, continue on eliquis '5mg'$  BID  Aortic Atherosclerosis by CT scan 10/11/2017 Control blood pressure, cholesterol, glucose, increase exercise.  Left hip pain and right knee pain Continue to follow with Dr. Ron Agee and continue prednisone  Continue diet and meds as discussed. Further disposition pending results of labs. Discussed med's effects and SE's.   Over 30 minutes of exam, counseling, chart review, and critical decision making was performed.   Future Appointments  Date Time Provider [place]  07/28/2021 11:00 AM Unk Pinto, MD [facility] None  10/08/2021 11:00 AM Liane Comber, NP [facility] None    ----------------------------------------------------------------------------------------------------------------------  HPI 75 y.o. male  presents for 3 month follow up on hypertension, cholesterol, diabetes, weight and vitamin D deficiency.   Pt is to have a knee replacement of right knee, will be taking care of mother in law. Started Prednisone and has been getting cortisone injections in left hip due to pain from compensation for right knee.  Follows with Dr Ron Agee  BMI is Body mass index is 32.41 kg/m., he has been working on diet and exercise. Wt Readings from Last 3 Encounters:  03/23/21 239 lb (108.4 kg)  02/22/21 236 lb (107 kg)  10/08/20 237 lb (107.5 kg)    His  blood pressure has been controlled at home, today their BP is BP: 130/80 BP Readings from Last 3 Encounters:  03/23/21 130/80  02/22/21 124/76  10/08/20 122/80     He does workout. He denies chest pain, shortness of breath, dizziness.   He is on cholesterol medication Rosuvastatin and denies myalgias. His cholesterol is at goal. The cholesterol last visit was:   Lab Results  Component Value Date   CHOL 152 10/08/2020   HDL 46 10/08/2020   LDLCALC 84 10/08/2020   TRIG 122 10/08/2020   CHOLHDL 3.3 10/08/2020    He has been working on diet and exercise for prediabetes.  Last A1C in the office was:  Lab Results  Component Value Date   HGBA1C 5.5 07/09/2020   Patient is on Vitamin D supplement.   Lab Results  Component Value Date   VD25OH 61 07/09/2020        Current Medications:  Current Outpatient Medications on File Prior to Visit  Medication Sig   ALPRAZolam (XANAX) 1 MG tablet Take 1/2 - 1 tablet at Bedtime  ONLY if needed for Sleep &  limit to 5 days /week to avoid Addiction & Dementia / Patient knows to take by mouth   apixaban (ELIQUIS) 5 MG TABS tablet TAKE 1 TABLET BY MOUTH  TWICE DAILY   Carboxymethylcellulose Sodium (REFRESH TEARS OP) Place 1 drop into both eyes daily as needed (dry eyes).    Cholecalciferol (VITAMIN D3) 5000 units CAPS Take 10,000 Units by mouth daily.    diltiazem (CARDIZEM) 30 MG tablet Take 1 tablet every 4 hours AS NEEDED for AFIB heart rate >100 as long as blood pressure >100.   finasteride (  PROSCAR) 5 MG tablet Take 5 mg by mouth daily.   flecainide (TAMBOCOR) 100 MG tablet TAKE 1 TABLET BY MOUTH  TWICE DAILY   losartan (COZAAR) 50 MG tablet TAKE 1 TABLET BY MOUTH  DAILY FOR BLOOD PRESSURE   Multiple Vitamin (MULTIVITAMIN) tablet Take 1 tablet by mouth daily.   Omega-3 Fatty Acids (FISH OIL PO) Take 1 capsule by mouth 2 (two) times daily.   predniSONE (DELTASONE) 20 MG tablet Take 20 mg by mouth daily with breakfast. 3 tablets for 3 days, 2  tablets for 3 days, then 1 tablet for 5 days   rosuvastatin (CRESTOR) 10 MG tablet Take  1 tablet  Daily  for Cholesterol   verapamil (CALAN-SR) 120 MG CR tablet Take    1 tablet     Daily     with Food    for BP & Heart Rhythm   vitamin C (ASCORBIC ACID) 500 MG tablet Take 500 mg by mouth daily.   zinc gluconate 50 MG tablet Take 50 mg by mouth daily.   Flaxseed, Linseed, (FLAX SEED OIL PO) Take 1 capsule by mouth daily. (Patient not taking: Reported on 03/23/2021)   No current facility-administered medications on file prior to visit.     Allergies:  Allergies  Allergen Reactions   Xarelto [Rivaroxaban] Other (See Comments)    Severe body aches   Atorvastatin Other (See Comments)    Myalgias   Clonazepam Other (See Comments)    "Made me jittery"   Tamsulosin Hcl Other (See Comments)    Hypotension     Medical History:  Past Medical History:  Diagnosis Date   A-fib ([place])    Arthritis    Benign prostatic hyperplasia    BPH (benign prostatic hypertrophy)    Carpal tunnel syndrome, bilateral    Early cataract    History of SCC (squamous cell carcinoma) of skin 10/04/2016   HNP (herniated nucleus pulposus), cervical    Hyperlipidemia    Hypertension 2007   IBS (irritable bowel syndrome)    Mild acid reflux occasional   Obesity    Other abnormal glucose    Pneumonia    " walking"   Pre-diabetes    Vitamin D deficiency    Wears glasses    Family history- Reviewed and unchanged Social history- Reviewed and unchanged   Review of Systems:  Review of Systems  Constitutional:  Negative for chills and fever.  HENT:  Negative for congestion, hearing loss and sore throat.   Eyes:  Negative for blurred vision and double vision.  Respiratory:  Negative for cough, shortness of breath and wheezing.   Cardiovascular:  Negative for palpitations and leg swelling.  Gastrointestinal:  Negative for abdominal pain, constipation, diarrhea, heartburn, nausea and vomiting.   Musculoskeletal:  Positive for back pain and joint pain.  Skin:  Negative for rash.  Neurological:  Negative for dizziness and headaches.  Psychiatric/Behavioral:  Negative for depression. The patient has insomnia (difficulty on prednisone).      Physical Exam: BP 130/80   Pulse 73   Temp (!) 97.5 F (36.4 C)   Wt 239 lb (108.4 kg)   SpO2 97%   BMI 32.41 kg/m  Wt Readings from Last 3 Encounters:  03/23/21 239 lb (108.4 kg)  02/22/21 236 lb (107 kg)  10/08/20 237 lb (107.5 kg)   General Appearance: Well nourished, in no apparent distress. Eyes: PERRLA, EOMs, conjunctiva no swelling or erythema Sinuses: No Frontal/maxillary tenderness ENT/Mouth: Ext aud canals clear, TMs  without erythema, bulging. No erythema, swelling, or exudate on post pharynx.  Tonsils not swollen or erythematous. Hearing normal.  Neck: Supple, thyroid normal.  Respiratory: Respiratory effort normal, BS equal bilaterally without rales, rhonchi, wheezing or stridor.  Cardio: RRR with no MRGs. Brisk peripheral pulses without edema.  Abdomen: Soft, + BS.  Non tender, no guarding, rebound, hernias, masses. Lymphatics: Non tender without lymphadenopathy.  Musculoskeletal: Full ROM, 5/5 strength, Ataxic gait Skin: Warm, dry without rashes, lesions, ecchymosis.  Neuro: Cranial nerves intact. No cerebellar symptoms.  Psych: Awake and oriented X 3, normal affect, Insight and Judgment appropriate.    Magda Bernheim, NP 11:19 AM [facility]

## 2021-03-23 ENCOUNTER — Ambulatory Visit (INDEPENDENT_AMBULATORY_CARE_PROVIDER_SITE_OTHER): Payer: Medicare Other | Admitting: Nurse Practitioner

## 2021-03-23 ENCOUNTER — Encounter: Payer: Self-pay | Admitting: Nurse Practitioner

## 2021-03-23 ENCOUNTER — Other Ambulatory Visit: Payer: Self-pay

## 2021-03-23 VITALS — BP 130/80 | HR 73 | Temp 97.5°F | Wt 239.0 lb

## 2021-03-23 DIAGNOSIS — E782 Mixed hyperlipidemia: Secondary | ICD-10-CM

## 2021-03-23 DIAGNOSIS — I10 Essential (primary) hypertension: Secondary | ICD-10-CM | POA: Diagnosis not present

## 2021-03-23 DIAGNOSIS — I700 Atherosclerosis of aorta: Secondary | ICD-10-CM

## 2021-03-23 DIAGNOSIS — I480 Paroxysmal atrial fibrillation: Secondary | ICD-10-CM

## 2021-03-23 DIAGNOSIS — R7309 Other abnormal glucose: Secondary | ICD-10-CM | POA: Diagnosis not present

## 2021-03-23 NOTE — Patient Instructions (Signed)

## 2021-03-24 ENCOUNTER — Other Ambulatory Visit: Payer: Self-pay | Admitting: Nurse Practitioner

## 2021-03-24 DIAGNOSIS — D72829 Elevated white blood cell count, unspecified: Secondary | ICD-10-CM

## 2021-03-24 DIAGNOSIS — R7401 Elevation of levels of liver transaminase levels: Secondary | ICD-10-CM

## 2021-03-24 LAB — CBC WITH DIFFERENTIAL/PLATELET
Absolute Monocytes: 1255 cells/uL — ABNORMAL HIGH (ref 200–950)
Basophils Absolute: 56 cells/uL (ref 0–200)
Basophils Relative: 0.4 %
Eosinophils Absolute: 0 cells/uL — ABNORMAL LOW (ref 15–500)
Eosinophils Relative: 0 %
HCT: 52.4 % — ABNORMAL HIGH (ref 38.5–50.0)
Hemoglobin: 17.6 g/dL — ABNORMAL HIGH (ref 13.2–17.1)
Lymphs Abs: 1015 cells/uL (ref 850–3900)
MCH: 31.9 pg (ref 27.0–33.0)
MCHC: 33.6 g/dL (ref 32.0–36.0)
MCV: 95.1 fL (ref 80.0–100.0)
MPV: 10.2 fL (ref 7.5–12.5)
Monocytes Relative: 8.9 %
Neutro Abs: 11774 cells/uL — ABNORMAL HIGH (ref 1500–7800)
Neutrophils Relative %: 83.5 %
Platelets: 288 10*3/uL (ref 140–400)
RBC: 5.51 10*6/uL (ref 4.20–5.80)
RDW: 12.7 % (ref 11.0–15.0)
Total Lymphocyte: 7.2 %
WBC: 14.1 10*3/uL — ABNORMAL HIGH (ref 3.8–10.8)

## 2021-03-24 LAB — COMPLETE METABOLIC PANEL WITH GFR
AG Ratio: 1.9 (calc) (ref 1.0–2.5)
ALT: 63 U/L — ABNORMAL HIGH (ref 9–46)
AST: 39 U/L — ABNORMAL HIGH (ref 10–35)
Albumin: 4.6 g/dL (ref 3.6–5.1)
Alkaline phosphatase (APISO): 68 U/L (ref 35–144)
BUN: 24 mg/dL (ref 7–25)
CO2: 26 mmol/L (ref 20–32)
Calcium: 9.8 mg/dL (ref 8.6–10.3)
Chloride: 102 mmol/L (ref 98–110)
Creat: 0.78 mg/dL (ref 0.70–1.28)
Globulin: 2.4 g/dL (calc) (ref 1.9–3.7)
Glucose, Bld: 94 mg/dL (ref 65–99)
Potassium: 3.9 mmol/L (ref 3.5–5.3)
Sodium: 138 mmol/L (ref 135–146)
Total Bilirubin: 0.4 mg/dL (ref 0.2–1.2)
Total Protein: 7 g/dL (ref 6.1–8.1)
eGFR: 93 mL/min/{1.73_m2} (ref >=60)

## 2021-03-24 LAB — LIPID PANEL
Cholesterol: 161 mg/dL (ref <200)
HDL: 62 mg/dL (ref >=40)
LDL Cholesterol (Calc): 81 mg/dL (calc)
Non-HDL Cholesterol (Calc): 99 mg/dL (calc) (ref <130)
Total CHOL/HDL Ratio: 2.6 (calc) (ref <5.0)
Triglycerides: 99 mg/dL (ref <150)

## 2021-03-24 LAB — HEMOGLOBIN A1C
Hgb A1c MFr Bld: 5.4 % of total Hgb (ref <5.7)
Mean Plasma Glucose: 108 mg/dL
eAG (mmol/L): 6 mmol/L

## 2021-03-24 NOTE — Progress Notes (Signed)
Repeat CBC and hepatic panel in 4 weeks due to leukocytosis and elevated liver enzymes

## 2021-04-15 ENCOUNTER — Other Ambulatory Visit: Payer: Self-pay | Admitting: Internal Medicine

## 2021-04-15 DIAGNOSIS — I480 Paroxysmal atrial fibrillation: Secondary | ICD-10-CM

## 2021-04-15 DIAGNOSIS — I10 Essential (primary) hypertension: Secondary | ICD-10-CM

## 2021-04-15 MED ORDER — VERAPAMIL HCL ER 120 MG PO TBCR: EXTENDED_RELEASE_TABLET | ORAL | 3 refills | Status: DC

## 2021-04-19 ENCOUNTER — Other Ambulatory Visit: Payer: Self-pay

## 2021-04-19 ENCOUNTER — Ambulatory Visit (INDEPENDENT_AMBULATORY_CARE_PROVIDER_SITE_OTHER): Payer: Medicare Other

## 2021-04-19 VITALS — BP 118/70 | HR 71 | Temp 97.9°F | Wt 239.0 lb

## 2021-04-19 DIAGNOSIS — Z23 Encounter for immunization: Secondary | ICD-10-CM | POA: Diagnosis not present

## 2021-04-19 DIAGNOSIS — D72829 Elevated white blood cell count, unspecified: Secondary | ICD-10-CM | POA: Diagnosis not present

## 2021-04-19 DIAGNOSIS — R7401 Elevation of levels of liver transaminase levels: Secondary | ICD-10-CM

## 2021-04-19 NOTE — Progress Notes (Signed)
Patient presents to the office for a nurse visit to have labs done and receive a flu vaccine. No changes to medications and no questions or concerns. Vitals taken and recorded.

## 2021-04-20 LAB — HEPATIC FUNCTION PANEL
AG Ratio: 2.1 (calc) (ref 1.0–2.5)
ALT: 33 U/L (ref 9–46)
AST: 24 U/L (ref 10–35)
Albumin: 4.5 g/dL (ref 3.6–5.1)
Alkaline phosphatase (APISO): 74 U/L (ref 35–144)
Bilirubin, Direct: 0.1 mg/dL (ref 0.0–0.2)
Globulin: 2.1 g/dL (calc) (ref 1.9–3.7)
Indirect Bilirubin: 0.4 mg/dL (calc) (ref 0.2–1.2)
Total Bilirubin: 0.5 mg/dL (ref 0.2–1.2)
Total Protein: 6.6 g/dL (ref 6.1–8.1)

## 2021-04-20 LAB — CBC WITH DIFFERENTIAL/PLATELET
Absolute Monocytes: 737 cells/uL (ref 200–950)
Basophils Absolute: 76 cells/uL (ref 0–200)
Basophils Relative: 1 %
Eosinophils Absolute: 312 cells/uL (ref 15–500)
Eosinophils Relative: 4.1 %
HCT: 48 % (ref 38.5–50.0)
Hemoglobin: 16 g/dL (ref 13.2–17.1)
Lymphs Abs: 1604 cells/uL (ref 850–3900)
MCH: 32 pg (ref 27.0–33.0)
MCHC: 33.3 g/dL (ref 32.0–36.0)
MCV: 96 fL (ref 80.0–100.0)
MPV: 9.9 fL (ref 7.5–12.5)
Monocytes Relative: 9.7 %
Neutro Abs: 4872 cells/uL (ref 1500–7800)
Neutrophils Relative %: 64.1 %
Platelets: 244 10*3/uL (ref 140–400)
RBC: 5 10*6/uL (ref 4.20–5.80)
RDW: 12.7 % (ref 11.0–15.0)
Total Lymphocyte: 21.1 %
WBC: 7.6 10*3/uL (ref 3.8–10.8)

## 2021-05-03 ENCOUNTER — Ambulatory Visit: Payer: Medicare Other | Admitting: Internal Medicine

## 2021-05-05 DIAGNOSIS — M545 Low back pain, unspecified: Secondary | ICD-10-CM | POA: Diagnosis not present

## 2021-05-10 DIAGNOSIS — M47816 Spondylosis without myelopathy or radiculopathy, lumbar region: Secondary | ICD-10-CM | POA: Diagnosis not present

## 2021-05-20 DIAGNOSIS — M1712 Unilateral primary osteoarthritis, left knee: Secondary | ICD-10-CM | POA: Diagnosis not present

## 2021-05-31 DIAGNOSIS — M25562 Pain in left knee: Secondary | ICD-10-CM | POA: Diagnosis not present

## 2021-06-14 DIAGNOSIS — M25562 Pain in left knee: Secondary | ICD-10-CM | POA: Diagnosis not present

## 2021-06-15 DIAGNOSIS — M1712 Unilateral primary osteoarthritis, left knee: Secondary | ICD-10-CM | POA: Diagnosis not present

## 2021-06-15 DIAGNOSIS — M1711 Unilateral primary osteoarthritis, right knee: Secondary | ICD-10-CM | POA: Diagnosis not present

## 2021-06-15 DIAGNOSIS — M6281 Muscle weakness (generalized): Secondary | ICD-10-CM | POA: Diagnosis not present

## 2021-06-15 DIAGNOSIS — R2689 Other abnormalities of gait and mobility: Secondary | ICD-10-CM | POA: Diagnosis not present

## 2021-06-16 ENCOUNTER — Telehealth: Payer: Self-pay | Admitting: Internal Medicine

## 2021-06-16 NOTE — Chronic Care Management (AMB) (Signed)
  Chronic Care Management   Outreach Note  06/16/2021 Name: [name] MRN: [id number] DOB: 1946-07-10  Referred by: Unk Pinto, MD Reason for referral : No chief complaint on file.   An unsuccessful telephone outreach was attempted today. The patient was referred to the pharmacist for assistance with care management and care coordination.   Follow Up Plan:   Tatjana Dellinger Upstream Scheduler

## 2021-06-18 DIAGNOSIS — M1711 Unilateral primary osteoarthritis, right knee: Secondary | ICD-10-CM | POA: Diagnosis not present

## 2021-06-18 DIAGNOSIS — R2689 Other abnormalities of gait and mobility: Secondary | ICD-10-CM | POA: Diagnosis not present

## 2021-06-18 DIAGNOSIS — M6281 Muscle weakness (generalized): Secondary | ICD-10-CM | POA: Diagnosis not present

## 2021-06-18 DIAGNOSIS — M1712 Unilateral primary osteoarthritis, left knee: Secondary | ICD-10-CM | POA: Diagnosis not present

## 2021-06-21 DIAGNOSIS — M1711 Unilateral primary osteoarthritis, right knee: Secondary | ICD-10-CM | POA: Diagnosis not present

## 2021-06-21 DIAGNOSIS — R2689 Other abnormalities of gait and mobility: Secondary | ICD-10-CM | POA: Diagnosis not present

## 2021-06-21 DIAGNOSIS — M1712 Unilateral primary osteoarthritis, left knee: Secondary | ICD-10-CM | POA: Diagnosis not present

## 2021-06-21 DIAGNOSIS — M6281 Muscle weakness (generalized): Secondary | ICD-10-CM | POA: Diagnosis not present

## 2021-06-23 DIAGNOSIS — M6281 Muscle weakness (generalized): Secondary | ICD-10-CM | POA: Diagnosis not present

## 2021-06-23 DIAGNOSIS — R2689 Other abnormalities of gait and mobility: Secondary | ICD-10-CM | POA: Diagnosis not present

## 2021-06-23 DIAGNOSIS — M1712 Unilateral primary osteoarthritis, left knee: Secondary | ICD-10-CM | POA: Diagnosis not present

## 2021-06-23 DIAGNOSIS — M1711 Unilateral primary osteoarthritis, right knee: Secondary | ICD-10-CM | POA: Diagnosis not present

## 2021-06-24 DIAGNOSIS — M47816 Spondylosis without myelopathy or radiculopathy, lumbar region: Secondary | ICD-10-CM | POA: Diagnosis not present

## 2021-06-25 ENCOUNTER — Telehealth: Payer: Self-pay | Admitting: Internal Medicine

## 2021-06-25 NOTE — Chronic Care Management (AMB) (Signed)
°  Chronic Care Management   Outreach Note  06/25/2021 Name: EZEQUIAS LARD MRN: [id number] DOB: 08/14/1945  Referred by: Unk Pinto, MD Reason for referral : No chief complaint on file.   A second unsuccessful telephone outreach was attempted today. The patient was referred to pharmacist for assistance with care management and care coordination.  Follow Up Plan:   Tatjana Dellinger Upstream Scheduler

## 2021-06-27 ENCOUNTER — Other Ambulatory Visit: Payer: Self-pay | Admitting: Internal Medicine

## 2021-06-28 ENCOUNTER — Telehealth: Payer: Self-pay | Admitting: Internal Medicine

## 2021-06-28 NOTE — Chronic Care Management (AMB) (Signed)
°  Chronic Care Management   Outreach Note  06/28/2021 Name: [name] MRN: [id number] DOB: 08-03-1945  Referred by: Unk Pinto, MD Reason for referral : No chief complaint on file.   Third unsuccessful telephone outreach was attempted today. The patient was referred to the pharmacist for assistance with care management and care coordination.   Follow Up Plan:   Tatjana Dellinger Upstream Scheduler

## 2021-07-01 DIAGNOSIS — M1711 Unilateral primary osteoarthritis, right knee: Secondary | ICD-10-CM | POA: Diagnosis not present

## 2021-07-01 DIAGNOSIS — R2689 Other abnormalities of gait and mobility: Secondary | ICD-10-CM | POA: Diagnosis not present

## 2021-07-01 DIAGNOSIS — M6281 Muscle weakness (generalized): Secondary | ICD-10-CM | POA: Diagnosis not present

## 2021-07-01 DIAGNOSIS — M1712 Unilateral primary osteoarthritis, left knee: Secondary | ICD-10-CM | POA: Diagnosis not present

## 2021-07-05 ENCOUNTER — Other Ambulatory Visit: Payer: Self-pay | Admitting: Internal Medicine

## 2021-07-05 DIAGNOSIS — Z1211 Encounter for screening for malignant neoplasm of colon: Secondary | ICD-10-CM

## 2021-07-05 DIAGNOSIS — Z1212 Encounter for screening for malignant neoplasm of rectum: Secondary | ICD-10-CM

## 2021-07-06 DIAGNOSIS — M6281 Muscle weakness (generalized): Secondary | ICD-10-CM | POA: Diagnosis not present

## 2021-07-06 DIAGNOSIS — M1711 Unilateral primary osteoarthritis, right knee: Secondary | ICD-10-CM | POA: Diagnosis not present

## 2021-07-06 DIAGNOSIS — M1712 Unilateral primary osteoarthritis, left knee: Secondary | ICD-10-CM | POA: Diagnosis not present

## 2021-07-06 DIAGNOSIS — R2689 Other abnormalities of gait and mobility: Secondary | ICD-10-CM | POA: Diagnosis not present

## 2021-07-07 ENCOUNTER — Other Ambulatory Visit (HOSPITAL_COMMUNITY): Payer: Medicare Other

## 2021-07-08 DIAGNOSIS — M1711 Unilateral primary osteoarthritis, right knee: Secondary | ICD-10-CM | POA: Diagnosis not present

## 2021-07-08 DIAGNOSIS — R2689 Other abnormalities of gait and mobility: Secondary | ICD-10-CM | POA: Diagnosis not present

## 2021-07-08 DIAGNOSIS — M1712 Unilateral primary osteoarthritis, left knee: Secondary | ICD-10-CM | POA: Diagnosis not present

## 2021-07-08 DIAGNOSIS — M6281 Muscle weakness (generalized): Secondary | ICD-10-CM | POA: Diagnosis not present

## 2021-07-13 DIAGNOSIS — M1711 Unilateral primary osteoarthritis, right knee: Secondary | ICD-10-CM | POA: Diagnosis not present

## 2021-07-15 DIAGNOSIS — M1712 Unilateral primary osteoarthritis, left knee: Secondary | ICD-10-CM | POA: Diagnosis not present

## 2021-07-15 DIAGNOSIS — M1711 Unilateral primary osteoarthritis, right knee: Secondary | ICD-10-CM | POA: Diagnosis not present

## 2021-07-15 DIAGNOSIS — M6281 Muscle weakness (generalized): Secondary | ICD-10-CM | POA: Diagnosis not present

## 2021-07-15 DIAGNOSIS — R2689 Other abnormalities of gait and mobility: Secondary | ICD-10-CM | POA: Diagnosis not present

## 2021-07-19 ENCOUNTER — Ambulatory Visit: Admit: 2021-07-19 | Payer: Medicare Other | Admitting: Orthopedic Surgery

## 2021-07-19 SURGERY — ARTHROPLASTY, KNEE, TOTAL
Anesthesia: Choice | Site: Knee | Laterality: Right

## 2021-07-26 DIAGNOSIS — H524 Presbyopia: Secondary | ICD-10-CM | POA: Diagnosis not present

## 2021-07-26 DIAGNOSIS — Z961 Presence of intraocular lens: Secondary | ICD-10-CM | POA: Diagnosis not present

## 2021-07-26 DIAGNOSIS — H52203 Unspecified astigmatism, bilateral: Secondary | ICD-10-CM | POA: Diagnosis not present

## 2021-07-26 DIAGNOSIS — H53001 Unspecified amblyopia, right eye: Secondary | ICD-10-CM | POA: Diagnosis not present

## 2021-07-26 DIAGNOSIS — M47816 Spondylosis without myelopathy or radiculopathy, lumbar region: Secondary | ICD-10-CM | POA: Diagnosis not present

## 2021-07-27 ENCOUNTER — Encounter: Payer: Self-pay | Admitting: Internal Medicine

## 2021-07-27 NOTE — Progress Notes (Signed)
Annual  Screening/Preventative Visit  & Comprehensive Evaluation & Examination  Future Appointments  Date Time Provider Department  07/28/2021         CPE 11:00 AM Unk Pinto, MD [facility]  10/08/2021      Wellness 11:00 AM Liane Comber, NP [facility]  08/02/2022       CPE 11:00 AM Unk Pinto, MD [facility]            This very nice 76 y.o. MWM presents for a Screening /Preventative Visit & comprehensive evaluation and management of multiple medical co-morbidities.  Patient has been followed for HTN, HLD, Prediabetes and Vitamin D Deficiency.  Chest CT scan in April 2019 showed Aortic atherosclerosis.         HTN predates since  2007. Patient's BP has been controlled at home.  Today's BP is at goal - 128/76. In 2018, patient was dx'd with pAfib & started on Eliquis for CHADsVASC2by Dr Rayann Heman. Patient denies any cardiac symptoms as chest pain, palpitations, shortness of breath, dizziness or ankle swelling.       Patient's hyperlipidemia is controlled with diet and Rosuvas=tatin.. Patient denies myalgias or other medication SE's. Last lipids were at goal :  Lab Results  Component Value Date   CHOL 161 03/23/2021   HDL 62 03/23/2021   LDLCALC 81 03/23/2021   TRIG 99 03/23/2021   CHOLHDL 2.6 03/23/2021         Patient has hx/o prediabetes (A1c 5.7% /2013).  Patient denies reactive hypoglycemic symptoms, visual blurring, diabetic polys or paresthesias. Last A1c was normal & at goal :   Lab Results  Component Value Date   HGBA1C 5.4 03/23/2021          Finally, patient has history of Vitamin D Deficiency  ("33" /2008) and last vitamin D was at goal :   Lab Results  Component Value Date   VD25OH 65 07/09/2020     Current Outpatient Medications on File Prior to Visit  Medication Sig   ALPRAZolam 1 MG tablet Take 1/2 - 1 tablet at Bedtime  ONLY if needed    apixaban (ELIQUIS) 5 MG  TAKE 1 TABLET  TWICE DAILY   REFRESH TEARS  Place 1 drop into both eyes daily as  needed (dry eyes).    VITAMIN D 5000 units Take 10,000 Units b daily.    diltiazem 30 MG tablet Take 1 tablet every 4 hours AS NEEDED for AFIB heart rate >100 as long as blood pressure >100.   finasteride 5 MG tablet Take 5 mg  daily.   flecainide 100 MG tablet TAKE 1 TABLET  TWICE DAILY   losartan 50 MG tablet TAKE 1 TABLET  DAILY    Multiple Vitamin  Take 1 tablet daily.   Omega-3 FISH OIL Take 1 capsule 2  times daily.   rosuvastatin  10 MG tablet Take  1 tablet  Daily  for Cholesterol   verapamil -SR  120 MG CR  Take  1 tablet  Daily     vitamin C  500 MG tablet Take 500 mg by mouth daily.   zinc gluconate 50 MG tablet Take 50 mg by mouth daily.      Allergies  Allergen Reactions   Xarelto [Rivaroxaban] Other (See Comments)    Severe body aches   Atorvastatin Other (See Comments)    Myalgias   Clonazepam Other (See Comments)    "Made me jittery"   Tamsulosin Hcl Other (See Comments)    Hypotension  Past Medical History:  Diagnosis Date   A-fib [facility])    Arthritis    Benign prostatic hyperplasia    BPH (benign prostatic hypertrophy)    Carpal tunnel syndrome, bilateral    Early cataract    History of SCC (squamous cell carcinoma) of skin 10/04/2016   HNP (herniated nucleus pulposus), cervical    Hyperlipidemia    Hypertension 2007   IBS (irritable bowel syndrome)    Mild acid reflux occasional   Obesity    Other abnormal glucose    Pneumonia    " walking"   Pre-diabetes    Vitamin D deficiency    Wears glasses      Health Maintenance  Topic Date Due   Zoster Vaccines- Shingrix (1 of 2) Never done   COVID-19 Vaccine (3 - Moderna risk series) 10/07/2019   Fecal DNA (Cologuard)  05/21/2021   TETANUS/TDAP  12/15/2025   Pneumonia Vaccine 48+ Years old  Completed   INFLUENZA VACCINE  Completed   Hepatitis C Screening  Completed   HPV VACCINES  Aged Out     Immunization History  Administered Date(s) Administered   DTaP 07/11/2005   Influenza Whole  04/02/2013   Influenza, High Dose Seasonal PF 05/01/2014, 05/21/2015, 03/29/2016, 05/31/2017, 05/05/2018, 05/01/2019, 04/19/2021   Moderna Sars-Covid-2 Vaccination 08/10/2019, 09/09/2019   Pneumococcal Conjugate-13 05/01/2014   Pneumococcal Polysaccharide-23 09/08/2008, 03/29/2016   Td 12/16/2015   Zoster, Live 12/09/2008    Last Colon -  08/08/2006  Cologard - 05/21/2018 - Negative - recc 3 year f/u due Nov 2022 - Overdue   Past Surgical History:  Procedure Laterality Date   ANTERIOR CERVICAL DECOMP/DISCECTOMY FUSION  01-25-2002   C4 - C5   ANTERIOR CERVICAL DECOMP/DISCECTOMY FUSION Bilateral 07/16/2018   Procedure: REMOVAL OF ANTERIOR CERVICAL PLATE, ANTERIOR CERVICAL DECOMPRESSION/DISCECTOMY FUSION CERVICAL THREE- CERVICAL FOUR;  Surgeon: Jovita Gamma, MD;  Location: [place];  Service: Neurosurgery;  Laterality: Bilateral;  REMOVAL OF ANTERIOR CERVICAL PLATE, ANTERIOR CERVICAL DECOMPRESSION/DISCECTOMY FUSION CERVICAL THREE- CERVICAL FOUR   ANTERIOR CRUCIATE LIGAMENT REPAIR  1990   RIGHT   APPENDECTOMY  02-07-2007   CARPAL TUNNEL RELEASE Right 07/16/2018   Procedure: CARPAL TUNNEL RELEASE;  Surgeon: Jovita Gamma, MD;  Location: [place];  Service: Neurosurgery;  Laterality: Right;  CARPAL TUNNEL RELEASE   CATARACT EXTRACTION Right 01/2020   CERVICAL FUSION  2000   C5 - 6   COLONOSCOPY  05-30-2011   HEMORRHOIDS   HERNIA REPAIR     HIP SURGERY  1999   LEFT HIP --  REMOVAL CALCIUM BUILD-UP   LEFT ANKLE SURG.  [place]   LEFT ROTATOR CUFF REPAIR  2002   LOOP RECORDER INSERTION N/A 09/19/2016   Procedure: Loop Recorder Insertion;  Surgeon: Thompson Grayer, MD;  Location: [place] [facility];  Service: Cardiovascular;  Laterality: N/A;   TENDON RECONSTRUCTION Left 07/17/2013   Procedure: LEFT ELBOW LATERAL RECONSTRUCTION;  Surgeon: Schuyler Amor, MD;  Location: [place];  Service: Orthopedics;  Laterality: Left;   TONSILLECTOMY      TRIGGER FINGER RELEASE Right 07/17/2013   Procedure: RELEASE A-1 PULLEY RIGHT RING FINGER;  Surgeon: Schuyler Amor, MD;  Location: [place];  Service: Orthopedics;  Laterality: Right;   UMBILICAL HERNIA REPAIR  2003   XI ROBOTIC ASSISTED SIMPLE PROSTATECTOMY N/A 05/22/2019   Procedure: XI ROBOTIC ASSISTED SIMPLE PROSTATECTOMY;  Surgeon: Alexis Frock, MD;  Location: [facility];  Service: Urology;  Laterality:  N/A;  3 HRS     Family History  Problem Relation Age of Onset   Heart disease Mother    Stroke Mother 89   Heart disease Father    Diabetes Father    Heart attack Father 33   Alcohol abuse Father      Social History   Tobacco Use   Smoking status: Never   Smokeless tobacco: Never  Vaping Use   Vaping Use: Never used  Substance Use Topics   Alcohol use: Yes    Alcohol/week: 4.0 standard drinks    Types: 4 Standard drinks or equivalent per week    Comment: OCCASIONAL   Drug use: No      ROS Constitutional: Denies fever, chills, weight loss/gain, headaches, insomnia,  night sweats or change in appetite. Does c/o fatigue. Eyes: Denies redness, blurred vision, diplopia, discharge, itchy or watery eyes.  ENT: Denies discharge, congestion, post nasal drip, epistaxis, sore throat, earache, hearing loss, dental pain, Tinnitus, Vertigo, Sinus pain or snoring.  Cardio: Denies chest pain, palpitations, irregular heartbeat, syncope, dyspnea, diaphoresis, orthopnea, PND, claudication or edema Respiratory: denies cough, dyspnea, DOE, pleurisy, hoarseness, laryngitis or wheezing.  Gastrointestinal: Denies dysphagia, heartburn, reflux, water brash, pain, cramps, nausea, vomiting, bloating, diarrhea, constipation, hematemesis, melena, hematochezia, jaundice or hemorrhoids Genitourinary: Denies dysuria, frequency, urgency, nocturia, hesitancy, discharge, hematuria or flank pain Musculoskeletal: Denies arthralgia, myalgia, stiffness, Jt. Swelling, pain, limp or  strain/sprain. Denies Falls. Skin: Denies puritis, rash, hives, warts, acne, eczema or change in skin lesion Neuro: No weakness, tremor, incoordination, spasms, paresthesia or pain Psychiatric: Denies confusion, memory loss or sensory loss. Denies Depression. Endocrine: Denies change in weight, skin, hair change, nocturia, and paresthesia, diabetic polys, visual blurring or hyper / hypo glycemic episodes.  Heme/Lymph: No excessive bleeding, bruising or enlarged lymph nodes.   Physical Exam  BP 128/76    Pulse 100    Temp 97.9 F (36.6 C)    Resp 16    Ht 6' (1.829 m)    Wt 236 lb 12.8 oz (107.4 kg)    SpO2 96%    BMI 32.12 kg/m   General Appearance: Well nourished and well groomed and in no apparent distress.  Eyes: PERRLA, EOMs, conjunctiva no swelling or erythema, normal fundi and vessels. Sinuses: No frontal/maxillary tenderness ENT/Mouth: EACs patent / TMs  nl. Nares clear without erythema, swelling, mucoid exudates. Oral hygiene is good. No erythema, swelling, or exudate. Tongue normal, non-obstructing. Tonsils not swollen or erythematous. Hearing normal.  Neck: Supple, thyroid not palpable. No bruits, nodes or JVD. Respiratory: Respiratory effort normal.  BS equal and clear bilateral without rales, rhonci, wheezing or stridor. Cardio: Heart sounds are normal with regular rate and rhythm and no murmurs, rubs or gallops. Peripheral pulses are normal and equal bilaterally without edema. No aortic or femoral bruits. Chest: symmetric with normal excursions and percussion.  Abdomen: Soft, with Nl bowel sounds. Nontender, no guarding, rebound, hernias, masses, or organomegaly.  Lymphatics: Non tender without lymphadenopathy.  Musculoskeletal: Full ROM all peripheral extremities, joint stability, 5/5 strength, and normal gait. Skin: Warm and dry without rashes, lesions, cyanosis, clubbing or  ecchymosis.  Neuro: Cranial nerves intact, reflexes equal bilaterally. Normal muscle tone, no  cerebellar symptoms. Sensation intact.  Pysch: Alert and oriented X 3 with normal affect, insight and judgment appropriate.   Assessment and Plan    1. Encounter for general adult medical examination with abnormal findings   2. Essential hypertension  - EKG 12-Lead - Urinalysis, Routine w reflex  microscopic - Microalbumin / creatinine urine ratio - CBC with Differential/Platelet - COMPLETE METABOLIC PANEL WITH GFR - Magnesium - TSH  3. Hyperlipidemia, mixed  - EKG 12-Lead - Lipid panel - TSH  4. Abnormal glucose  - EKG 12-Lead - Hemoglobin A1c - Insulin, random  5. Vitamin D deficiency  - VITAMIN D 25 Hydroxy  6. Aortic atherosclerosis ([place]) by Chest CT scan on 10/11/2017  - EKG 12-Lead  7. Paroxysmal atrial fibrillation ([facility])  - EKG 12-Lead - TSH  8. Benign prostatic hyperplasia with lower urinary tract symptoms  - PSA  9. Screening for colorectal cancer  - Cologuard  10. Screening for heart disease  - EKG 12-Lead  11. FHx: heart disease  - EKG 12-Lead  12. Screening for AAA (aortic abdominal aneurysm)   13. Prostate cancer screening  - PSA  14. Medication management  - Urinalysis, Routine w reflex microscopic - Microalbumin / creatinine urine ratio - CBC with Differential/Platelet - COMPLETE METABOLIC PANEL WITH GFR - Magnesium - Lipid panel - TSH - Hemoglobin A1c - Insulin, random - VITAMIN D 25 Hydroxy          Patient was counseled in prudent diet, weight control to achieve/maintain BMI less than 25, BP monitoring, regular exercise and medications as discussed.  Discussed med effects and SE's. Routine screening labs and tests as requested with regular follow-up as recommended. Over 40 minutes of exam, counseling, chart review and high complex critical decision making was performed   Kirtland Bouchard, MD

## 2021-07-27 NOTE — Patient Instructions (Signed)

## 2021-07-28 ENCOUNTER — Other Ambulatory Visit: Payer: Self-pay

## 2021-07-28 ENCOUNTER — Ambulatory Visit (INDEPENDENT_AMBULATORY_CARE_PROVIDER_SITE_OTHER): Payer: Medicare Other | Admitting: Internal Medicine

## 2021-07-28 ENCOUNTER — Encounter: Payer: Self-pay | Admitting: Internal Medicine

## 2021-07-28 VITALS — BP 128/76 | HR 100 | Temp 97.9°F | Resp 16 | Ht 72.0 in | Wt 236.8 lb

## 2021-07-28 DIAGNOSIS — Z1211 Encounter for screening for malignant neoplasm of colon: Secondary | ICD-10-CM

## 2021-07-28 DIAGNOSIS — Z136 Encounter for screening for cardiovascular disorders: Secondary | ICD-10-CM

## 2021-07-28 DIAGNOSIS — Z125 Encounter for screening for malignant neoplasm of prostate: Secondary | ICD-10-CM

## 2021-07-28 DIAGNOSIS — I700 Atherosclerosis of aorta: Secondary | ICD-10-CM | POA: Diagnosis not present

## 2021-07-28 DIAGNOSIS — N401 Enlarged prostate with lower urinary tract symptoms: Secondary | ICD-10-CM | POA: Diagnosis not present

## 2021-07-28 DIAGNOSIS — I10 Essential (primary) hypertension: Secondary | ICD-10-CM

## 2021-07-28 DIAGNOSIS — I480 Paroxysmal atrial fibrillation: Secondary | ICD-10-CM

## 2021-07-28 DIAGNOSIS — Z79899 Other long term (current) drug therapy: Secondary | ICD-10-CM

## 2021-07-28 DIAGNOSIS — R7309 Other abnormal glucose: Secondary | ICD-10-CM | POA: Diagnosis not present

## 2021-07-28 DIAGNOSIS — Z0001 Encounter for general adult medical examination with abnormal findings: Secondary | ICD-10-CM

## 2021-07-28 DIAGNOSIS — Z8249 Family history of ischemic heart disease and other diseases of the circulatory system: Secondary | ICD-10-CM | POA: Diagnosis not present

## 2021-07-28 DIAGNOSIS — E782 Mixed hyperlipidemia: Secondary | ICD-10-CM | POA: Diagnosis not present

## 2021-07-28 DIAGNOSIS — E559 Vitamin D deficiency, unspecified: Secondary | ICD-10-CM | POA: Diagnosis not present

## 2021-07-28 MED ORDER — VERAPAMIL HCL ER 120 MG PO TBCR: EXTENDED_RELEASE_TABLET | ORAL | 3 refills | Status: DC

## 2021-07-28 MED ORDER — TRAZODONE HCL 150 MG PO TABS: ORAL_TABLET | ORAL | 1 refills | Status: DC

## 2021-07-29 NOTE — Progress Notes (Signed)
============================================================ °-   Test results slightly outside the reference range are not unusual. If there is anything important, I will review this with you,  otherwise it is considered normal test values.  If you have further questions,  please do not hesitate to contact me at the office or via My Chart.  ============================================================ ============================================================  -  Total  Chol =   141  - Excellent            (  Ideal  or  Goal is less than 180  !  )   - and   -  Bad / Dangerous LDL  Chol =   70  - also Great              (  Ideal  or  Goal is less than 70  !  )  ============================================================ ============================================================  -  PSA - 0.17 Very Low - Great ! ============================================================ ============================================================  -  A1c - Normal - No Diabetes  - Great ! ============================================================ ============================================================  -  Vitamin D = 82 - Excellent - Please Keep dose same  ============================================================ ============================================================  -  All Else - CBC - Kidneys - Electrolytes - Liver - Magnesium & Thyroid    - all  Normal / OK ============================================================ ============================================================  -  Keep up the [place] Work  !  ============================================================ ============================================================

## 2021-07-30 LAB — LIPID PANEL
Cholesterol: 141 mg/dL (ref <200)
HDL: 45 mg/dL (ref >=40)
LDL Cholesterol (Calc): 70 mg/dL (calc)
Non-HDL Cholesterol (Calc): 96 mg/dL (calc) (ref <130)
Total CHOL/HDL Ratio: 3.1 (calc) (ref <5.0)
Triglycerides: 188 mg/dL — ABNORMAL HIGH (ref <150)

## 2021-07-30 LAB — INSULIN, RANDOM: Insulin: 23.1 u[IU]/mL — ABNORMAL HIGH

## 2021-07-30 LAB — CBC WITH DIFFERENTIAL/PLATELET
Absolute Monocytes: 718 cells/uL (ref 200–950)
Basophils Absolute: 67 cells/uL (ref 0–200)
Basophils Relative: 0.9 %
Eosinophils Absolute: 259 cells/uL (ref 15–500)
Eosinophils Relative: 3.5 %
HCT: 48.4 % (ref 38.5–50.0)
Hemoglobin: 15.9 g/dL (ref 13.2–17.1)
Lymphs Abs: 1621 cells/uL (ref 850–3900)
MCH: 31.5 pg (ref 27.0–33.0)
MCHC: 32.9 g/dL (ref 32.0–36.0)
MCV: 96 fL (ref 80.0–100.0)
MPV: 9.8 fL (ref 7.5–12.5)
Monocytes Relative: 9.7 %
Neutro Abs: 4736 cells/uL (ref 1500–7800)
Neutrophils Relative %: 64 %
Platelets: 281 10*3/uL (ref 140–400)
RBC: 5.04 10*6/uL (ref 4.20–5.80)
RDW: 12.6 % (ref 11.0–15.0)
Total Lymphocyte: 21.9 %
WBC: 7.4 10*3/uL (ref 3.8–10.8)

## 2021-07-30 LAB — COMPLETE METABOLIC PANEL WITH GFR
AG Ratio: 2 (calc) (ref 1.0–2.5)
ALT: 25 U/L (ref 9–46)
AST: 20 U/L (ref 10–35)
Albumin: 4.5 g/dL (ref 3.6–5.1)
Alkaline phosphatase (APISO): 77 U/L (ref 35–144)
BUN: 18 mg/dL (ref 7–25)
CO2: 27 mmol/L (ref 20–32)
Calcium: 9.7 mg/dL (ref 8.6–10.3)
Chloride: 104 mmol/L (ref 98–110)
Creat: 0.87 mg/dL (ref 0.70–1.28)
Globulin: 2.3 g/dL (calc) (ref 1.9–3.7)
Glucose, Bld: 82 mg/dL (ref 65–99)
Potassium: 4.2 mmol/L (ref 3.5–5.3)
Sodium: 139 mmol/L (ref 135–146)
Total Bilirubin: 0.4 mg/dL (ref 0.2–1.2)
Total Protein: 6.8 g/dL (ref 6.1–8.1)
eGFR: 90 mL/min/{1.73_m2} (ref >=60)

## 2021-07-30 LAB — MAGNESIUM: Magnesium: 2 mg/dL (ref 1.5–2.5)

## 2021-07-30 LAB — HEMOGLOBIN A1C
Hgb A1c MFr Bld: 5.3 % of total Hgb (ref <5.7)
Mean Plasma Glucose: 105 mg/dL
eAG (mmol/L): 5.8 mmol/L

## 2021-07-30 LAB — TSH: TSH: 1.3 mIU/L (ref 0.40–4.50)

## 2021-07-30 LAB — VITAMIN D 25 HYDROXY (VIT D DEFICIENCY, FRACTURES): Vit D, 25-Hydroxy: 82 ng/mL (ref 30–100)

## 2021-07-30 LAB — PSA: PSA: 0.17 ng/mL (ref <=4.00)

## 2021-08-04 DIAGNOSIS — S83262A Peripheral tear of lateral meniscus, current injury, left knee, initial encounter: Secondary | ICD-10-CM | POA: Diagnosis not present

## 2021-08-04 DIAGNOSIS — X58XXXA Exposure to other specified factors, initial encounter: Secondary | ICD-10-CM | POA: Diagnosis not present

## 2021-08-04 DIAGNOSIS — Y999 Unspecified external cause status: Secondary | ICD-10-CM | POA: Diagnosis not present

## 2021-08-04 DIAGNOSIS — S83242A Other tear of medial meniscus, current injury, left knee, initial encounter: Secondary | ICD-10-CM | POA: Diagnosis not present

## 2021-08-04 DIAGNOSIS — G8918 Other acute postprocedural pain: Secondary | ICD-10-CM | POA: Diagnosis not present

## 2021-08-04 DIAGNOSIS — M94262 Chondromalacia, left knee: Secondary | ICD-10-CM | POA: Diagnosis not present

## 2021-08-06 DIAGNOSIS — S83207S Unspecified tear of unspecified meniscus, current injury, left knee, sequela: Secondary | ICD-10-CM | POA: Diagnosis not present

## 2021-08-06 DIAGNOSIS — M25561 Pain in right knee: Secondary | ICD-10-CM | POA: Diagnosis not present

## 2021-08-06 DIAGNOSIS — R2689 Other abnormalities of gait and mobility: Secondary | ICD-10-CM | POA: Diagnosis not present

## 2021-08-06 DIAGNOSIS — M6281 Muscle weakness (generalized): Secondary | ICD-10-CM | POA: Diagnosis not present

## 2021-08-09 ENCOUNTER — Other Ambulatory Visit: Payer: Self-pay | Admitting: Internal Medicine

## 2021-08-09 ENCOUNTER — Other Ambulatory Visit: Payer: Self-pay

## 2021-08-09 DIAGNOSIS — E782 Mixed hyperlipidemia: Secondary | ICD-10-CM

## 2021-08-09 MED ORDER — ROSUVASTATIN CALCIUM 10 MG PO TABS: ORAL_TABLET | ORAL | 3 refills | Status: DC

## 2021-08-10 DIAGNOSIS — R2689 Other abnormalities of gait and mobility: Secondary | ICD-10-CM | POA: Diagnosis not present

## 2021-08-10 DIAGNOSIS — M6281 Muscle weakness (generalized): Secondary | ICD-10-CM | POA: Diagnosis not present

## 2021-08-10 DIAGNOSIS — M25561 Pain in right knee: Secondary | ICD-10-CM | POA: Diagnosis not present

## 2021-08-10 DIAGNOSIS — S83207S Unspecified tear of unspecified meniscus, current injury, left knee, sequela: Secondary | ICD-10-CM | POA: Diagnosis not present

## 2021-08-13 DIAGNOSIS — M25561 Pain in right knee: Secondary | ICD-10-CM | POA: Diagnosis not present

## 2021-08-13 DIAGNOSIS — S83207S Unspecified tear of unspecified meniscus, current injury, left knee, sequela: Secondary | ICD-10-CM | POA: Diagnosis not present

## 2021-08-13 DIAGNOSIS — M6281 Muscle weakness (generalized): Secondary | ICD-10-CM | POA: Diagnosis not present

## 2021-08-13 DIAGNOSIS — R2689 Other abnormalities of gait and mobility: Secondary | ICD-10-CM | POA: Diagnosis not present

## 2021-08-16 DIAGNOSIS — M25561 Pain in right knee: Secondary | ICD-10-CM | POA: Diagnosis not present

## 2021-08-17 DIAGNOSIS — M6281 Muscle weakness (generalized): Secondary | ICD-10-CM | POA: Diagnosis not present

## 2021-08-17 DIAGNOSIS — D1801 Hemangioma of skin and subcutaneous tissue: Secondary | ICD-10-CM | POA: Diagnosis not present

## 2021-08-17 DIAGNOSIS — Z85828 Personal history of other malignant neoplasm of skin: Secondary | ICD-10-CM | POA: Diagnosis not present

## 2021-08-17 DIAGNOSIS — L821 Other seborrheic keratosis: Secondary | ICD-10-CM | POA: Diagnosis not present

## 2021-08-17 DIAGNOSIS — M25561 Pain in right knee: Secondary | ICD-10-CM | POA: Diagnosis not present

## 2021-08-17 DIAGNOSIS — S83207S Unspecified tear of unspecified meniscus, current injury, left knee, sequela: Secondary | ICD-10-CM | POA: Diagnosis not present

## 2021-08-17 DIAGNOSIS — R2689 Other abnormalities of gait and mobility: Secondary | ICD-10-CM | POA: Diagnosis not present

## 2021-08-17 DIAGNOSIS — L570 Actinic keratosis: Secondary | ICD-10-CM | POA: Diagnosis not present

## 2021-08-19 DIAGNOSIS — R2689 Other abnormalities of gait and mobility: Secondary | ICD-10-CM | POA: Diagnosis not present

## 2021-08-19 DIAGNOSIS — S83207S Unspecified tear of unspecified meniscus, current injury, left knee, sequela: Secondary | ICD-10-CM | POA: Diagnosis not present

## 2021-08-19 DIAGNOSIS — M6281 Muscle weakness (generalized): Secondary | ICD-10-CM | POA: Diagnosis not present

## 2021-08-19 DIAGNOSIS — M25561 Pain in right knee: Secondary | ICD-10-CM | POA: Diagnosis not present

## 2021-08-23 DIAGNOSIS — M25561 Pain in right knee: Secondary | ICD-10-CM | POA: Diagnosis not present

## 2021-08-23 DIAGNOSIS — M6281 Muscle weakness (generalized): Secondary | ICD-10-CM | POA: Diagnosis not present

## 2021-08-23 DIAGNOSIS — R2689 Other abnormalities of gait and mobility: Secondary | ICD-10-CM | POA: Diagnosis not present

## 2021-08-23 DIAGNOSIS — S83207S Unspecified tear of unspecified meniscus, current injury, left knee, sequela: Secondary | ICD-10-CM | POA: Diagnosis not present

## 2021-08-25 ENCOUNTER — Telehealth: Payer: Self-pay

## 2021-08-25 NOTE — Telephone Encounter (Signed)
Called pt back and left message informing pt that his medication flecainide 100 mg tablet was sent to [facility] mail order pharmacy on 12/22 with a year supply and that I called the pharmacy and they are getting his medication ready to be sent out. I advise pt that if he has any other problems, questions or concerns, to give our office a call back.

## 2021-08-26 DIAGNOSIS — M25561 Pain in right knee: Secondary | ICD-10-CM | POA: Diagnosis not present

## 2021-08-26 DIAGNOSIS — R2689 Other abnormalities of gait and mobility: Secondary | ICD-10-CM | POA: Diagnosis not present

## 2021-08-26 DIAGNOSIS — M6281 Muscle weakness (generalized): Secondary | ICD-10-CM | POA: Diagnosis not present

## 2021-08-26 DIAGNOSIS — S83207S Unspecified tear of unspecified meniscus, current injury, left knee, sequela: Secondary | ICD-10-CM | POA: Diagnosis not present

## 2021-09-01 DIAGNOSIS — M25561 Pain in right knee: Secondary | ICD-10-CM | POA: Diagnosis not present

## 2021-09-01 DIAGNOSIS — S83207S Unspecified tear of unspecified meniscus, current injury, left knee, sequela: Secondary | ICD-10-CM | POA: Diagnosis not present

## 2021-09-01 DIAGNOSIS — M6281 Muscle weakness (generalized): Secondary | ICD-10-CM | POA: Diagnosis not present

## 2021-09-01 DIAGNOSIS — R2689 Other abnormalities of gait and mobility: Secondary | ICD-10-CM | POA: Diagnosis not present

## 2021-09-02 DIAGNOSIS — M47816 Spondylosis without myelopathy or radiculopathy, lumbar region: Secondary | ICD-10-CM | POA: Diagnosis not present

## 2021-09-03 ENCOUNTER — Other Ambulatory Visit: Payer: Self-pay | Admitting: Internal Medicine

## 2021-09-03 ENCOUNTER — Encounter: Payer: Self-pay | Admitting: Internal Medicine

## 2021-09-03 DIAGNOSIS — S83207S Unspecified tear of unspecified meniscus, current injury, left knee, sequela: Secondary | ICD-10-CM | POA: Diagnosis not present

## 2021-09-03 DIAGNOSIS — M25561 Pain in right knee: Secondary | ICD-10-CM | POA: Diagnosis not present

## 2021-09-03 DIAGNOSIS — R2689 Other abnormalities of gait and mobility: Secondary | ICD-10-CM | POA: Diagnosis not present

## 2021-09-03 DIAGNOSIS — G4700 Insomnia, unspecified: Secondary | ICD-10-CM

## 2021-09-03 DIAGNOSIS — M6281 Muscle weakness (generalized): Secondary | ICD-10-CM | POA: Diagnosis not present

## 2021-09-03 MED ORDER — HYDROXYZINE HCL 50 MG PO TABS: ORAL_TABLET | ORAL | 0 refills | Status: DC

## 2021-09-06 DIAGNOSIS — R310 Gross hematuria: Secondary | ICD-10-CM | POA: Diagnosis not present

## 2021-09-06 DIAGNOSIS — R3915 Urgency of urination: Secondary | ICD-10-CM | POA: Diagnosis not present

## 2021-09-07 DIAGNOSIS — S83207S Unspecified tear of unspecified meniscus, current injury, left knee, sequela: Secondary | ICD-10-CM | POA: Diagnosis not present

## 2021-09-07 DIAGNOSIS — M6281 Muscle weakness (generalized): Secondary | ICD-10-CM | POA: Diagnosis not present

## 2021-09-07 DIAGNOSIS — M25561 Pain in right knee: Secondary | ICD-10-CM | POA: Diagnosis not present

## 2021-09-07 DIAGNOSIS — R2689 Other abnormalities of gait and mobility: Secondary | ICD-10-CM | POA: Diagnosis not present

## 2021-09-10 DIAGNOSIS — R2689 Other abnormalities of gait and mobility: Secondary | ICD-10-CM | POA: Diagnosis not present

## 2021-09-10 DIAGNOSIS — S83207S Unspecified tear of unspecified meniscus, current injury, left knee, sequela: Secondary | ICD-10-CM | POA: Diagnosis not present

## 2021-09-10 DIAGNOSIS — M6281 Muscle weakness (generalized): Secondary | ICD-10-CM | POA: Diagnosis not present

## 2021-09-10 DIAGNOSIS — M25561 Pain in right knee: Secondary | ICD-10-CM | POA: Diagnosis not present

## 2021-09-14 DIAGNOSIS — S83207S Unspecified tear of unspecified meniscus, current injury, left knee, sequela: Secondary | ICD-10-CM | POA: Diagnosis not present

## 2021-09-14 DIAGNOSIS — M6281 Muscle weakness (generalized): Secondary | ICD-10-CM | POA: Diagnosis not present

## 2021-09-14 DIAGNOSIS — M25561 Pain in right knee: Secondary | ICD-10-CM | POA: Diagnosis not present

## 2021-09-14 DIAGNOSIS — R2689 Other abnormalities of gait and mobility: Secondary | ICD-10-CM | POA: Diagnosis not present

## 2021-09-16 ENCOUNTER — Other Ambulatory Visit: Payer: Self-pay | Admitting: Internal Medicine

## 2021-09-16 DIAGNOSIS — I480 Paroxysmal atrial fibrillation: Secondary | ICD-10-CM

## 2021-09-16 DIAGNOSIS — F419 Anxiety disorder, unspecified: Secondary | ICD-10-CM

## 2021-09-16 MED ORDER — ESCITALOPRAM OXALATE 20 MG PO TABS: ORAL_TABLET | ORAL | 1 refills | Status: DC

## 2021-09-16 NOTE — Telephone Encounter (Signed)
Prescription refill request for Eliquis received. ? ?Indication: afib  ?Last office visit: [place], 02/22/2021 ?Scr: 0.87, 07/28/2021 ?Age: 76 yo  ?Weight: 107.4 kg  ? ?Refill sent.  ?

## 2021-09-17 DIAGNOSIS — M47816 Spondylosis without myelopathy or radiculopathy, lumbar region: Secondary | ICD-10-CM | POA: Diagnosis not present

## 2021-09-20 DIAGNOSIS — M25561 Pain in right knee: Secondary | ICD-10-CM | POA: Diagnosis not present

## 2021-09-20 DIAGNOSIS — S83207S Unspecified tear of unspecified meniscus, current injury, left knee, sequela: Secondary | ICD-10-CM | POA: Diagnosis not present

## 2021-09-20 DIAGNOSIS — R2689 Other abnormalities of gait and mobility: Secondary | ICD-10-CM | POA: Diagnosis not present

## 2021-09-20 DIAGNOSIS — M6281 Muscle weakness (generalized): Secondary | ICD-10-CM | POA: Diagnosis not present

## 2021-10-01 ENCOUNTER — Other Ambulatory Visit: Payer: Self-pay | Admitting: Nurse Practitioner

## 2021-10-01 ENCOUNTER — Other Ambulatory Visit: Payer: Self-pay | Admitting: Internal Medicine

## 2021-10-01 DIAGNOSIS — G4700 Insomnia, unspecified: Secondary | ICD-10-CM

## 2021-10-08 ENCOUNTER — Ambulatory Visit: Payer: Medicare Other | Admitting: Adult Health

## 2021-10-11 NOTE — Progress Notes (Signed)
MEDICARE ANNUAL WELLNESS VISIT AND FOLLOW UP ? ?Assessment:  ? ?Encounter for annual wellness medicare visit ?Yearly ? ?Essential hypertension ?- continue medications, DASH diet, exercise and monitor at home. Call if greater than 130/80.  ?-     CBC with Differential/Platelet ?-     CMP/GFR ? ?Paroxysmal atrial fibrillation (HCC) ?NSR at this time, following Dr. Rayann Heman, continue elequis 44m BID ? ?Mixed hyperlipidemia ?Continue medications: rosuvastatin279m?Discussed dietary and exercise modifications ?Low fat diet ?-     Lipid panel ?  ?Anxiety ?He has discontinued Xanax and was started on Lexapro 20 mg QD ?Dose changed to Lexapro 5 mg daily ? ?Insomnia ?He is off Xanax and is currently on Hydroxyzine 50 mg 1/2 tab QHS ? ?[place] 10/11/17 ?Control blood pressure, cholesterol, glucose, increase exercise. ? ?Other abnormal glucose ?Discussed general issues about diabetes pathophysiology and management., Educational material distributed., Suggested low cholesterol diet., Encouraged aerobic exercise., Discussed foot care., Reminded to get yearly retinal exam. ? ?Vitamin D deficiency ?Continue supplement ? ?Benign prostatic hyperplasia with urinary frequency ?Continue follow up urology - Dr. MaTresa Moore ?Irritable bowel syndrome, unspecified type ?Diet discussed ? ?Obesity BMI ?- long discussion about weight loss, diet, and exercise ? ?History of SCC (squamous cell carcinoma) of skin ?Follow up DERM ?  ?Medication management ?-     Magnesium ? ?Colon Cancer Screening ?- Cologuard is still at home, had some rectal bleeding so could not due but will do in upcoming month ? ? ? ? ?Further disposition pending results if labs check today. Discussed med's effects and SE's.   ?Over 30 minutes of face to face interview, exam, counseling, chart review, and critical decision making was performed.  ? ? ?Future Appointments  ?Date Time Provider [place]?01/13/2022 10:30 AM McUnk PintoMD  [facility] None  ?08/02/2022 11:00 AM McUnk PintoMD [facility] None  ?10/12/2022 11:00 AM CoLiane ComberNP [facility] None  ? ? ? ?Plan:  ? ?During the course of the visit the patient was educated and counseled about appropriate screening and preventive services including:  ? ?Pneumococcal vaccine  ?Influenza vaccine ?Td vaccine ?Screening electrocardiogram ?Colorectal cancer screening ?Diabetes screening ?Glaucoma screening ?Nutrition counseling  ? ? ?Subjective:  ?Johnathan HANNERSs a [age].o. male who presents for Medicare Annual Wellness Visit and 3 month follow up for HTN, HLD, and vitamin D Def.  ? ?He was started on Lexapro and Hydroxyzine to stop use of Xanax. He has been taking only a half of the hydroxyzine and doing well. He was feeling more hungover in the morning, having more dry mouth and gas.  ? ?He has history of Afib He is on tambocor, verapamil he did go back on the Eliquis due to his vertigo and headaches.  ? ?He had right cataract surgery , Due for follow up ?He has rash where he shaves.  ? ?He has lump on his leg that he wants looked at.  ? ?Follows with Dr. MaTresa Mooreor BPH just had OV last week, no changes. ? ?Left knee is doing well after surgery. He is doing stationary bike for exercise. ?  ?BMI is Body mass index is 31.36 kg/m?., he has not been working on diet.  ?Wt Readings from Last 3 Encounters:  ?10/13/21 231 lb 3.2 oz (104.9 kg)  ?07/28/21 236 lb 12.8 oz (107.4 kg)  ?04/19/21 239 lb (108.4 kg)  ? ?His blood pressure has been controlled at home, today their BP is BP: 132/74  ?  BP Readings from Last 3 Encounters:  ?10/13/21 132/74  ?07/28/21 128/76  ?04/19/21 118/70  ?He does workout. He denies chest pain, shortness of breath, dizziness.  ? ? ?He is on cholesterol medication, on rosuvastatin 20 mg daily, has increased this and denies myalgias. His cholesterol is at goal. The cholesterol last visit was:   ?Lab Results  ?Component Value Date  ? CHOL 141 07/28/2021  ? HDL 45 07/28/2021   ? [place] 70 07/28/2021  ? TRIG 188 (H) 07/28/2021  ? CHOLHDL 3.1 07/28/2021  ? ? Had elevated A1C in 2013, has done well with diet/exercise. Last A1C in the office was:  ?Lab Results  ?Component Value Date  ? HGBA1C 5.3 07/28/2021  ? ?Patient is on Vitamin D supplement.   ?Lab Results  ?Component Value Date  ? VD25OH 82 07/28/2021  ?  ? ? ?Medication Review: ? ? ?Current Outpatient Medications (Cardiovascular):  ?  diltiazem (CARDIZEM) 30 MG tablet, Take 1 tablet every 4 hours AS NEEDED for AFIB heart rate >100 as long as blood pressure >100. ?  flecainide (TAMBOCOR) 100 MG tablet, TAKE 1 TABLET BY MOUTH  TWICE DAILY ?  losartan (COZAAR) 50 MG tablet, TAKE 1 TABLET BY MOUTH  DAILY FOR BLOOD PRESSURE ?  rosuvastatin (CRESTOR) 10 MG tablet, Take  1 tablet  Daily  for Cholesterol ?  verapamil (CALAN-SR) 120 MG CR tablet, Take  1 tablet  Daily  with Food  for BP & Heart Rhythm ? ? ? ?Current Outpatient Medications (Hematological):  ?  apixaban (ELIQUIS) 5 MG TABS tablet, TAKE 1 TABLET BY MOUTH  TWICE DAILY ? ?Current Outpatient Medications (Other):  ?  Carboxymethylcellulose Sodium (REFRESH TEARS OP), Place 1 drop into both eyes daily as needed (dry eyes).  ?  Cholecalciferol (VITAMIN D3) 5000 units CAPS, Take 10,000 Units by mouth daily.  ?  finasteride (PROSCAR) 5 MG tablet, Take 5 mg by mouth daily. ?  Flaxseed, Linseed, (FLAX SEED OIL PO), Take 1 capsule by mouth daily. ?  hydrOXYzine (ATARAX) 50 MG tablet, TAKE 1 TO 2 TABLETS 1 TO 2 HOURS BEFORE BEDTIME IF NEEDED FOR SLEEP ?  Multiple Vitamin (MULTIVITAMIN) tablet, Take 1 tablet by mouth daily. ?  Omega-3 Fatty Acids (FISH OIL PO), Take 1 capsule by mouth 2 (two) times daily. ?  vitamin C (ASCORBIC ACID) 500 MG tablet, Take 500 mg by mouth daily. ?  zinc gluconate 50 MG tablet, Take 50 mg by mouth daily. ? ?Current Problems (verified) ?Patient Active Problem List  ? Diagnosis Date Noted  ? BPH (benign prostatic hyperplasia) 05/22/2019  ? Insomnia 08/09/2018  ?  HNP (herniated nucleus pulposus), cervical 07/16/2018  ? Aortic atherosclerosis ([place]) by Chest CT scan on 10/11/2017 10/11/2017  ? Arthritis of carpometacarpal [facility]) joint of left thumb 07/27/2017  ? Chronic pain of left thumb 07/27/2017  ? History of SCC (squamous cell carcinoma) of skin 10/04/2016  ? Atrial fibrillation ([place]) 08/01/2016  ? Obesity (BMI 30.0-34.9) 09/30/2013  ? Essential hypertension   ? Hyperlipidemia, mixed   ? BPH with obstruction/lower urinary tract symptoms   ? Abnormal glucose   ? IBS (irritable bowel syndrome)   ? Vitamin D deficiency   ? ? ?Screening Tests ?Immunization History  ?Administered Date(s) Administered  ? DTaP 07/11/2005  ? Influenza Whole 04/02/2013  ? Influenza, High Dose Seasonal PF 05/01/2014, 05/21/2015, 03/29/2016, 05/31/2017, 05/05/2018, 05/01/2019, 04/19/2021  ? Moderna Sars-Covid-2 Vaccination 08/10/2019, 09/09/2019  ? Pneumococcal Conjugate-13 05/01/2014  ? Pneumococcal Polysaccharide-23 09/08/2008, 03/29/2016  ?  Td 12/16/2015  ? Zoster, Live 12/09/2008  ? ?Health Maintenance  ?Topic Date Due  ? COVID-19 Vaccine (3 - Moderna risk series) 10/29/2021 (Originally 10/07/2019)  ? Fecal DNA (Cologuard)  12/13/2021 (Originally 05/21/2021)  ? Zoster Vaccines- Shingrix (1 of 2) 01/12/2022 (Originally 01/28/1965)  ? INFLUENZA VACCINE  02/08/2022  ? TETANUS/TDAP  12/15/2025  ? Pneumonia Vaccine 109+ Years old  Completed  ? Hepatitis C Screening  Completed  ? HPV VACCINES  Aged Out  ? ? ?Preventative care: ?Last colonoscopy: 2008 Dr. Deatra Ina  ?Cologuard 05/2018 has the kit. ?Stress test: 2018 ?Echo 08/2016  ?CXR 09/2016 ? ?Prior vaccinations: ?TD or Tdap: 2017 ?Influenza: 2018 ?Pneumococcal: 2010, 2017 ?Prevnar 13: 2015 ?Shingles/Zostavax: 2010 ? ?Names of Other Physician/Practitioners you currently use: ?1. [facility] here for primary care ?2. Dr. Satira Sark, eye doctor, visit yearly 08/2021 ?3. Dr. Jolayne Panther, dentist, q 6 months 2022 ? ?Patient Care  Team: ?Unk Pinto, MD as PCP - General (Internal Medicine) ?Thompson Grayer, MD as PCP - Cardiology (Cardiology) ?Charlotte Crumb, MD as Consulting Physician (Orthopedic Surgery) ?Inda Castle, MD ([place]

## 2021-10-13 ENCOUNTER — Encounter: Payer: Self-pay | Admitting: Nurse Practitioner

## 2021-10-13 ENCOUNTER — Ambulatory Visit (INDEPENDENT_AMBULATORY_CARE_PROVIDER_SITE_OTHER): Payer: Medicare Other | Admitting: Nurse Practitioner

## 2021-10-13 VITALS — BP 132/74 | HR 74 | Temp 97.9°F | Wt 231.2 lb

## 2021-10-13 DIAGNOSIS — Z0001 Encounter for general adult medical examination with abnormal findings: Secondary | ICD-10-CM

## 2021-10-13 DIAGNOSIS — N401 Enlarged prostate with lower urinary tract symptoms: Secondary | ICD-10-CM | POA: Diagnosis not present

## 2021-10-13 DIAGNOSIS — K589 Irritable bowel syndrome without diarrhea: Secondary | ICD-10-CM

## 2021-10-13 DIAGNOSIS — I480 Paroxysmal atrial fibrillation: Secondary | ICD-10-CM

## 2021-10-13 DIAGNOSIS — E669 Obesity, unspecified: Secondary | ICD-10-CM | POA: Diagnosis not present

## 2021-10-13 DIAGNOSIS — Z1211 Encounter for screening for malignant neoplasm of colon: Secondary | ICD-10-CM

## 2021-10-13 DIAGNOSIS — E782 Mixed hyperlipidemia: Secondary | ICD-10-CM

## 2021-10-13 DIAGNOSIS — F419 Anxiety disorder, unspecified: Secondary | ICD-10-CM

## 2021-10-13 DIAGNOSIS — I700 Atherosclerosis of aorta: Secondary | ICD-10-CM | POA: Diagnosis not present

## 2021-10-13 DIAGNOSIS — R7309 Other abnormal glucose: Secondary | ICD-10-CM | POA: Diagnosis not present

## 2021-10-13 DIAGNOSIS — E559 Vitamin D deficiency, unspecified: Secondary | ICD-10-CM | POA: Diagnosis not present

## 2021-10-13 DIAGNOSIS — G4700 Insomnia, unspecified: Secondary | ICD-10-CM | POA: Diagnosis not present

## 2021-10-13 DIAGNOSIS — Z79899 Other long term (current) drug therapy: Secondary | ICD-10-CM

## 2021-10-13 DIAGNOSIS — R6889 Other general symptoms and signs: Secondary | ICD-10-CM | POA: Diagnosis not present

## 2021-10-13 DIAGNOSIS — Z0000 Encounter for general adult medical examination without abnormal findings: Secondary | ICD-10-CM

## 2021-10-13 DIAGNOSIS — I10 Essential (primary) hypertension: Secondary | ICD-10-CM

## 2021-10-13 DIAGNOSIS — Z85828 Personal history of other malignant neoplasm of skin: Secondary | ICD-10-CM | POA: Diagnosis not present

## 2021-10-13 LAB — COMPLETE METABOLIC PANEL WITH GFR
AG Ratio: 1.8 (calc) (ref 1.0–2.5)
ALT: 24 U/L (ref 9–46)
AST: 18 U/L (ref 10–35)
Albumin: 4.3 g/dL (ref 3.6–5.1)
Alkaline phosphatase (APISO): 75 U/L (ref 35–144)
BUN: 14 mg/dL (ref 7–25)
CO2: 27 mmol/L (ref 20–32)
Calcium: 10.1 mg/dL (ref 8.6–10.3)
Chloride: 105 mmol/L (ref 98–110)
Creat: 0.83 mg/dL (ref 0.70–1.28)
Globulin: 2.4 g/dL (calc) (ref 1.9–3.7)
Glucose, Bld: 87 mg/dL (ref 65–99)
Potassium: 4.2 mmol/L (ref 3.5–5.3)
Sodium: 141 mmol/L (ref 135–146)
Total Bilirubin: 0.5 mg/dL (ref 0.2–1.2)
Total Protein: 6.7 g/dL (ref 6.1–8.1)
eGFR: 91 mL/min/{1.73_m2} (ref >=60)

## 2021-10-13 LAB — LIPID PANEL
Cholesterol: 177 mg/dL (ref <200)
HDL: 51 mg/dL (ref >=40)
LDL Cholesterol (Calc): 103 mg/dL (calc) — ABNORMAL HIGH
Non-HDL Cholesterol (Calc): 126 mg/dL (calc) (ref <130)
Total CHOL/HDL Ratio: 3.5 (calc) (ref <5.0)
Triglycerides: 130 mg/dL (ref <150)

## 2021-10-13 LAB — CBC WITH DIFFERENTIAL/PLATELET
Absolute Monocytes: 611 cells/uL (ref 200–950)
Basophils Absolute: 57 cells/uL (ref 0–200)
Basophils Relative: 0.8 %
Eosinophils Absolute: 220 cells/uL (ref 15–500)
Eosinophils Relative: 3.1 %
HCT: 46.8 % (ref 38.5–50.0)
Hemoglobin: 15.6 g/dL (ref 13.2–17.1)
Lymphs Abs: 1548 cells/uL (ref 850–3900)
MCH: 31.5 pg (ref 27.0–33.0)
MCHC: 33.3 g/dL (ref 32.0–36.0)
MCV: 94.4 fL (ref 80.0–100.0)
MPV: 9.8 fL (ref 7.5–12.5)
Monocytes Relative: 8.6 %
Neutro Abs: 4665 cells/uL (ref 1500–7800)
Neutrophils Relative %: 65.7 %
Platelets: 254 10*3/uL (ref 140–400)
RBC: 4.96 10*6/uL (ref 4.20–5.80)
RDW: 13 % (ref 11.0–15.0)
Total Lymphocyte: 21.8 %
WBC: 7.1 10*3/uL (ref 3.8–10.8)

## 2021-10-13 LAB — MAGNESIUM: Magnesium: 2 mg/dL (ref 1.5–2.5)

## 2021-10-13 MED ORDER — ESCITALOPRAM OXALATE 5 MG PO TABS: 5.0000 mg | ORAL_TABLET | Freq: Every day | ORAL | 3 refills | Status: DC

## 2021-10-25 DIAGNOSIS — Z20822 Contact with and (suspected) exposure to covid-19: Secondary | ICD-10-CM | POA: Diagnosis not present

## 2021-11-01 DIAGNOSIS — S46011A Strain of muscle(s) and tendon(s) of the rotator cuff of right shoulder, initial encounter: Secondary | ICD-10-CM | POA: Diagnosis not present

## 2021-11-01 DIAGNOSIS — M25561 Pain in right knee: Secondary | ICD-10-CM | POA: Diagnosis not present

## 2021-11-02 DIAGNOSIS — M25511 Pain in right shoulder: Secondary | ICD-10-CM | POA: Diagnosis not present

## 2021-11-09 DIAGNOSIS — M25511 Pain in right shoulder: Secondary | ICD-10-CM | POA: Diagnosis not present

## 2021-11-12 DIAGNOSIS — M25661 Stiffness of right knee, not elsewhere classified: Secondary | ICD-10-CM | POA: Diagnosis not present

## 2021-11-12 DIAGNOSIS — S8002XD Contusion of left knee, subsequent encounter: Secondary | ICD-10-CM | POA: Diagnosis not present

## 2021-11-12 DIAGNOSIS — M1711 Unilateral primary osteoarthritis, right knee: Secondary | ICD-10-CM | POA: Diagnosis not present

## 2021-11-12 DIAGNOSIS — M6281 Muscle weakness (generalized): Secondary | ICD-10-CM | POA: Diagnosis not present

## 2021-11-12 DIAGNOSIS — R262 Difficulty in walking, not elsewhere classified: Secondary | ICD-10-CM | POA: Diagnosis not present

## 2021-11-17 DIAGNOSIS — M6281 Muscle weakness (generalized): Secondary | ICD-10-CM | POA: Diagnosis not present

## 2021-11-17 DIAGNOSIS — M1711 Unilateral primary osteoarthritis, right knee: Secondary | ICD-10-CM | POA: Diagnosis not present

## 2021-11-17 DIAGNOSIS — R262 Difficulty in walking, not elsewhere classified: Secondary | ICD-10-CM | POA: Diagnosis not present

## 2021-11-17 DIAGNOSIS — S8002XD Contusion of left knee, subsequent encounter: Secondary | ICD-10-CM | POA: Diagnosis not present

## 2021-11-17 DIAGNOSIS — M25661 Stiffness of right knee, not elsewhere classified: Secondary | ICD-10-CM | POA: Diagnosis not present

## 2021-11-29 ENCOUNTER — Encounter: Payer: Self-pay | Admitting: Nurse Practitioner

## 2021-11-29 ENCOUNTER — Ambulatory Visit (INDEPENDENT_AMBULATORY_CARE_PROVIDER_SITE_OTHER): Payer: Medicare Other | Admitting: Nurse Practitioner

## 2021-11-29 VITALS — BP 122/68 | HR 80 | Temp 97.9°F | Wt 229.8 lb

## 2021-11-29 DIAGNOSIS — I10 Essential (primary) hypertension: Secondary | ICD-10-CM | POA: Diagnosis not present

## 2021-11-29 DIAGNOSIS — J029 Acute pharyngitis, unspecified: Secondary | ICD-10-CM

## 2021-11-29 MED ORDER — AMOXICILLIN-POT CLAVULANATE 875-125 MG PO TABS
1.0000 | ORAL_TABLET | Freq: Two times a day (BID) | ORAL | 0 refills | Status: DC
Start: 2021-11-29 — End: 2021-12-15

## 2021-11-29 MED ORDER — DEXAMETHASONE 1 MG PO TABS
ORAL_TABLET | ORAL | 0 refills | Status: DC
Start: 2021-11-29 — End: 2021-12-15

## 2021-11-29 NOTE — Progress Notes (Signed)
Assessment and Plan:  [name] was seen today for acute visit.  Diagnoses and all orders for this visit:  Sore throat Sleep with humidifier Warm salt water gargles at least 3 times a day Push fluids If no improvement in the next 5 days notify the office -     dexamethasone (DECADRON) 1 MG tablet; Take 3 tabs for 3 days, 2 tabs for 3 days 1 tab for 5 days. Take with food. -     amoxicillin-clavulanate (AUGMENTIN) 875-125 MG tablet; Take 1 tablet by mouth 2 (two) times daily.  Essential hypertension - continue medications, DASH diet, exercise and monitor at home. Call if greater than 130/80.        Further disposition pending results of labs. Discussed med's effects and SE's.   Over 30 minutes of exam, counseling, chart review, and critical decision making was performed.   Future Appointments  Date Time Provider [place]  01/13/2022 10:30 AM Unk Pinto, MD [facility] None  08/02/2022 11:00 AM Unk Pinto, MD [facility] None  10/12/2022 11:00 AM Liane Comber, NP [facility] None    ------------------------------------------------------------------------------------------------------------------   HPI BP 122/68   Pulse 80   Temp 97.9 F (36.6 C)   Wt 229 lb 12.8 oz (104.2 kg)   SpO2 96%   BMI 31.17 kg/m   75 y.o.male presents for sore throat, worse on right side x 6 days. Hurts to swallow anything.  He has also been noticing hoarseness that comes and goes x 3 weeks. Denies congestion, body aches, nausea, vomiting and diarrhea.    He is currently on dilitiazem 30 mg and verapamil 120 mg. BP is currently well controlled BP Readings from Last 3 Encounters:  11/29/21 122/68  10/13/21 132/74  07/28/21 128/76    Past Medical History:  Diagnosis Date   A-fib [facility])    Arthritis    Benign prostatic hyperplasia    BPH (benign prostatic hypertrophy)    Carpal tunnel syndrome, bilateral    Early cataract    History of SCC (squamous cell carcinoma) of skin  10/04/2016   HNP (herniated nucleus pulposus), cervical    Hyperlipidemia    Hypertension 2007   IBS (irritable bowel syndrome)    Mild acid reflux occasional   Obesity    Other abnormal glucose    Pneumonia    " walking"   Pre-diabetes    Vitamin D deficiency    Wears glasses      Allergies  Allergen Reactions   Xarelto [Rivaroxaban] Other (See Comments)    Severe body aches   Atorvastatin Other (See Comments)    Myalgias   Clonazepam Other (See Comments)    "Made me jittery"   Tamsulosin Hcl Other (See Comments)    Hypotension    Current Outpatient Medications on File Prior to Visit  Medication Sig   apixaban (ELIQUIS) 5 MG TABS tablet TAKE 1 TABLET BY MOUTH  TWICE DAILY   Carboxymethylcellulose Sodium (REFRESH TEARS OP) Place 1 drop into both eyes daily as needed (dry eyes).    Cholecalciferol (VITAMIN D3) 5000 units CAPS Take 10,000 Units by mouth daily.    diltiazem (CARDIZEM) 30 MG tablet Take 1 tablet every 4 hours AS NEEDED for AFIB heart rate >100 as long as blood pressure >100.   escitalopram (LEXAPRO) 5 MG tablet Take 1 tablet (5 mg total) by mouth daily.   finasteride (PROSCAR) 5 MG tablet Take 5 mg by mouth daily.   Flaxseed, Linseed, (FLAX SEED OIL PO) Take 1 capsule by  mouth daily.   flecainide (TAMBOCOR) 100 MG tablet TAKE 1 TABLET BY MOUTH  TWICE DAILY   hydrOXYzine (ATARAX) 50 MG tablet TAKE 1 TO 2 TABLETS 1 TO 2 HOURS BEFORE BEDTIME IF NEEDED FOR SLEEP   losartan (COZAAR) 50 MG tablet TAKE 1 TABLET BY MOUTH  DAILY FOR BLOOD PRESSURE   Multiple Vitamin (MULTIVITAMIN) tablet Take 1 tablet by mouth daily.   Omega-3 Fatty Acids (FISH OIL PO) Take 1 capsule by mouth 2 (two) times daily.   rosuvastatin (CRESTOR) 10 MG tablet Take  1 tablet  Daily  for Cholesterol   verapamil (CALAN-SR) 120 MG CR tablet Take  1 tablet  Daily  with Food  for BP & Heart Rhythm   vitamin C (ASCORBIC ACID) 500 MG tablet Take 500 mg by mouth daily.   zinc gluconate 50 MG tablet Take  50 mg by mouth daily.   No current facility-administered medications on file prior to visit.    ROS: all negative except above.   Physical Exam:  BP 122/68   Pulse 80   Temp 97.9 F (36.6 C)   Wt 229 lb 12.8 oz (104.2 kg)   SpO2 96%   BMI 31.17 kg/m   General Appearance: Well nourished, in no apparent distress. Eyes: PERRLA, EOMs, conjunctiva no swelling or erythema Sinuses: No Frontal/maxillary tenderness ENT/Mouth: Ext aud canals clear, TMs without erythema, bulging. Redness of post pharynx - more prominent on the right. No exudate noted. Hearing normal.  Neck: Supple, thyroid normal.  Respiratory: Respiratory effort normal, BS equal bilaterally without rales, rhonchi, wheezing or stridor.  Cardio: RRR with no MRGs. Brisk peripheral pulses without edema.  Abdomen: Soft, + BS.  Non tender, no guarding, rebound, hernias, masses. Lymphatics:Positive right submandibular adenopathy  Musculoskeletal: Full ROM, 5/5 strength, normal gait.  Skin: Warm, dry without rashes, lesions, ecchymosis.  Neuro: Cranial nerves intact. Normal muscle tone, no cerebellar symptoms. Sensation intact.  Psych: Awake and oriented X 3, normal affect, Insight and Judgment appropriate.     Magda Bernheim, NP 3:38 PM [facility]

## 2021-12-14 ENCOUNTER — Other Ambulatory Visit: Payer: Self-pay | Admitting: Orthopaedic Surgery

## 2021-12-14 ENCOUNTER — Telehealth: Payer: Self-pay | Admitting: Internal Medicine

## 2021-12-14 DIAGNOSIS — M25511 Pain in right shoulder: Secondary | ICD-10-CM | POA: Diagnosis not present

## 2021-12-14 DIAGNOSIS — Z01818 Encounter for other preprocedural examination: Secondary | ICD-10-CM

## 2021-12-14 NOTE — Telephone Encounter (Signed)
Patient states he is having shoulder replacement on 6/29, we should be receiving a clearance from [place].  Patient states he will be out of town the week of 6/19.  If you need to reach him it has to be the week of 6/12.

## 2021-12-15 ENCOUNTER — Telehealth: Payer: Self-pay

## 2021-12-15 ENCOUNTER — Other Ambulatory Visit: Payer: Self-pay

## 2021-12-15 ENCOUNTER — Ambulatory Visit
Admission: RE | Admit: 2021-12-15 | Discharge: 2021-12-15 | Disposition: A | Discharge status: Home / Self Care | Payer: Medicare Other | Source: Ambulatory Visit | Attending: Orthopaedic Surgery | Admitting: Orthopaedic Surgery

## 2021-12-15 ENCOUNTER — Encounter (HOSPITAL_BASED_OUTPATIENT_CLINIC_OR_DEPARTMENT_OTHER): Payer: Self-pay | Admitting: Orthopaedic Surgery

## 2021-12-15 DIAGNOSIS — Z01818 Encounter for other preprocedural examination: Secondary | ICD-10-CM

## 2021-12-15 DIAGNOSIS — M25511 Pain in right shoulder: Secondary | ICD-10-CM | POA: Diagnosis not present

## 2021-12-15 NOTE — Telephone Encounter (Signed)
   Pre-operative Risk Assessment    Patient Name: [name]  DOB: [birth year] MRN: [id number]      Request for Surgical Clearance    Procedure:   Right Reverse Total Shoulder Replacement  Date of Surgery:  Clearance 01/06/22                                 Surgeon:  Ophelia Charter, MD Surgeon's Group or Practice Name:  [facility] Phone number:  [phone] ext: [phone] Fax number:  [phone]   Type of Clearance Requested:   - Medical  - Pharmacy:  Hold Apixaban (Eliquis)     Type of Anesthesia:  General with interscalene block   Additional requests/questions:    Signed, Antawan Mchugh   12/15/2021, 12:42 PM

## 2021-12-16 ENCOUNTER — Telehealth: Payer: Self-pay | Admitting: *Deleted

## 2021-12-16 NOTE — Telephone Encounter (Signed)
Patient with diagnosis of Afib on Elquis for anticoagulation.    Procedure:  Right Reverse Total Shoulder Replacement Date of procedure: 01/06/22   CHA2DS2-VASc Score = 4  This indicates a 4.8% annual risk of stroke. The patient's score is based upon: CHF History: 0 HTN History: 1 Diabetes History: 0 Stroke History: 0 Vascular Disease History: 1 Age Score: 2 Gender Score: 0  CrCl 96.4 using AdjBW Platelet count 254K  Most recently cleared on 11/05/20 to hold for 3 days prior to R TKA. Okay to hold Eliquis for 3 days prior to procedure.

## 2021-12-16 NOTE — Telephone Encounter (Signed)
Pt agreeable to plan of care for tele pre op appt 12/22/21 @ 1 pm. Med rec and consent are done.     Patient Consent for Virtual Visit        [name] has provided verbal consent on 12/16/2021 for a virtual visit (video or telephone).   CONSENT FOR VIRTUAL VISIT FOR:  [name]  By participating in this virtual visit I agree to the following:  I hereby voluntarily request, consent and authorize [facility] and its employed or contracted physicians, physician assistants, nurse practitioners or other licensed health care professionals (the Practitioner), to provide me with telemedicine health care services (the "Services") as deemed necessary by the treating Practitioner. I acknowledge and consent to receive the Services by the Practitioner via telemedicine. I understand that the telemedicine visit will involve communicating with the Practitioner through live audiovisual communication technology and the disclosure of certain medical information by electronic transmission. I acknowledge that I have been given the opportunity to request an in-person assessment or other available alternative prior to the telemedicine visit and am voluntarily participating in the telemedicine visit.  I understand that I have the right to withhold or withdraw my consent to the use of telemedicine in the course of my care at any time, without affecting my right to future care or treatment, and that the Practitioner or I may terminate the telemedicine visit at any time. I understand that I have the right to inspect all information obtained and/or recorded in the course of the telemedicine visit and may receive copies of available information for a reasonable fee.  I understand that some of the potential risks of receiving the Services via telemedicine include:  Delay or interruption in medical evaluation due to technological equipment failure or disruption; Information transmitted may not be sufficient  (e.g. poor resolution of images) to allow for appropriate medical decision making by the Practitioner; and/or  In rare instances, security protocols could fail, causing a breach of personal health information.  Furthermore, I acknowledge that it is my responsibility to provide information about my medical history, conditions and care that is complete and accurate to the best of my ability. I acknowledge that Practitioner's advice, recommendations, and/or decision may be based on factors not within their control, such as incomplete or inaccurate data provided by me or distortions of diagnostic images or specimens that may result from electronic transmissions. I understand that the practice of medicine is not an exact science and that Practitioner makes no warranties or guarantees regarding treatment outcomes. I acknowledge that a copy of this consent can be made available to me via my patient portal ([facility]), or I can request a printed copy by calling the office of [facility].    I understand that my insurance will be billed for this visit.   I have read or had this consent read to me. I understand the contents of this consent, which adequately explains the benefits and risks of the Services being provided via telemedicine.  I have been provided ample opportunity to ask questions regarding this consent and the Services and have had my questions answered to my satisfaction. I give my informed consent for the services to be provided through the use of telemedicine in my medical care

## 2021-12-16 NOTE — Telephone Encounter (Signed)
Pt agreeable to plan of care for tele pre op appt 12/22/21 @ 1 pm. Med rec and consent are done.

## 2021-12-16 NOTE — Telephone Encounter (Signed)
    Name: [name]  DOB: 1946-06-22  MRN: [id number]  Primary Cardiologist: Thompson Grayer, MD   Preoperative team, please contact this patient and set up a phone call appointment for further preoperative risk assessment. Please obtain consent and complete medication review. Thank you for your help.  I confirm that guidance regarding antiplatelet and oral anticoagulation therapy has been completed and, if necessary, noted below.  Patient with diagnosis of Afib on Elquis for anticoagulation.     Procedure:  Right Reverse Total Shoulder Replacement Date of procedure: 01/06/22     CHA2DS2-VASc Score = 4  This indicates a 4.8% annual risk of stroke. The patient's score is based upon: CHF History: 0 HTN History: 1 Diabetes History: 0 Stroke History: 0 Vascular Disease History: 1 Age Score: 2 Gender Score: 0   CrCl 96.4 using AdjBW Platelet count 254K   Most recently cleared on 11/05/20 to hold for 3 days prior to R TKA. Okay to hold Eliquis for 3 days prior to procedure.  Lenna Sciara, NP 12/16/2021, 12:27 PM [facility] [address] [place], [place] [address]

## 2021-12-22 ENCOUNTER — Ambulatory Visit (INDEPENDENT_AMBULATORY_CARE_PROVIDER_SITE_OTHER): Payer: Medicare Other | Admitting: Nurse Practitioner

## 2021-12-22 ENCOUNTER — Encounter: Payer: Self-pay | Admitting: Nurse Practitioner

## 2021-12-22 DIAGNOSIS — Z01810 Encounter for preprocedural cardiovascular examination: Secondary | ICD-10-CM

## 2021-12-22 NOTE — Progress Notes (Deleted)
Assessment and Plan:  There are no diagnoses linked to this encounter.    Further disposition pending results of labs. Discussed med's effects and SE's.   Over 30 minutes of exam, counseling, chart review, and critical decision making was performed.   Future Appointments  Date Time Provider [place]  12/22/2021  1:00 PM [facility] PRE OP CLEARANCE APP [facility]  12/23/2021  9:00 AM Alycia Rossetti, NP [facility] None  08/02/2022 11:00 AM Unk Pinto, MD [facility] None  10/12/2022 11:00 AM Darrol Jump, NP [facility] None    ------------------------------------------------------------------------------------------------------------------   HPI There were no vitals taken for this visit. 76 y.o.male presents for  Past Medical History:  Diagnosis Date   A-fib ([place]) 2018   on eliquis   Anxiety    Arthritis    Benign prostatic hyperplasia    BPH (benign prostatic hypertrophy)    Carpal tunnel syndrome, bilateral    Early cataract    GERD (gastroesophageal reflux disease)    History of SCC (squamous cell carcinoma) of skin 10/04/2016   HNP (herniated nucleus pulposus), cervical    Hyperlipidemia    Hypertension 2007   IBS (irritable bowel syndrome)    Obesity    Other abnormal glucose    Pneumonia    " walking"   Pre-diabetes    Vitamin D deficiency    Wears glasses      Allergies  Allergen Reactions   Xarelto [Rivaroxaban] Other (See Comments)    Severe body aches   Atorvastatin Other (See Comments)    Myalgias   Clonazepam Other (See Comments)    "Made me jittery"   Tamsulosin Hcl Other (See Comments)    Hypotension    Current Outpatient Medications on File Prior to Visit  Medication Sig   apixaban (ELIQUIS) 5 MG TABS tablet TAKE 1 TABLET BY MOUTH  TWICE DAILY   Cholecalciferol (VITAMIN D3) 5000 units CAPS Take 10,000 Units by mouth daily.    diltiazem (CARDIZEM) 30 MG tablet Take 1 tablet every 4 hours AS NEEDED for AFIB  heart rate >100 as long as blood pressure >100.   escitalopram (LEXAPRO) 5 MG tablet Take 1 tablet (5 mg total) by mouth daily.   finasteride (PROSCAR) 5 MG tablet Take 5 mg by mouth daily.   Flaxseed, Linseed, (FLAX SEED OIL PO) Take 1 capsule by mouth daily.   flecainide (TAMBOCOR) 100 MG tablet TAKE 1 TABLET BY MOUTH  TWICE DAILY   hydrOXYzine (ATARAX) 50 MG tablet TAKE 1 TO 2 TABLETS 1 TO 2 HOURS BEFORE BEDTIME IF NEEDED FOR SLEEP   losartan (COZAAR) 50 MG tablet TAKE 1 TABLET BY MOUTH  DAILY FOR BLOOD PRESSURE   Multiple Vitamin (MULTIVITAMIN) tablet Take 1 tablet by mouth daily.   Omega-3 Fatty Acids (FISH OIL PO) Take 1 capsule by mouth 2 (two) times daily.   rosuvastatin (CRESTOR) 10 MG tablet Take  1 tablet  Daily  for Cholesterol   verapamil (CALAN-SR) 120 MG CR tablet Take  1 tablet  Daily  with Food  for BP & Heart Rhythm   vitamin C (ASCORBIC ACID) 500 MG tablet Take 500 mg by mouth daily.   zinc gluconate 50 MG tablet Take 50 mg by mouth daily.   No current facility-administered medications on file prior to visit.    ROS: all negative except above.   Physical Exam:  There were no vitals taken for this visit.  General Appearance: Well nourished, in no apparent distress. Eyes: PERRLA, EOMs, conjunctiva no  swelling or erythema Sinuses: No Frontal/maxillary tenderness ENT/Mouth: Ext aud canals clear, TMs without erythema, bulging. No erythema, swelling, or exudate on post pharynx.  Tonsils not swollen or erythematous. Hearing normal.  Neck: Supple, thyroid normal.  Respiratory: Respiratory effort normal, BS equal bilaterally without rales, rhonchi, wheezing or stridor.  Cardio: RRR with no MRGs. Brisk peripheral pulses without edema.  Abdomen: Soft, + BS.  Non tender, no guarding, rebound, hernias, masses. Lymphatics: Non tender without lymphadenopathy.  Musculoskeletal: Full ROM, 5/5 strength, normal gait.  Skin: Warm, dry without rashes, lesions, ecchymosis.  Neuro:  Cranial nerves intact. Normal muscle tone, no cerebellar symptoms. Sensation intact.  Psych: Awake and oriented X 3, normal affect, Insight and Judgment appropriate.     Alycia Rossetti, NP 10:09 AM [facility]

## 2021-12-22 NOTE — Progress Notes (Signed)
Virtual Visit via Telephone Note   Because of [name]'s co-morbid illnesses, he is at least at moderate risk for complications without adequate follow up.  This format is felt to be most appropriate for this patient at this time.  The patient did not have access to video technology/had technical difficulties with video requiring transitioning to audio format only (telephone).  All issues noted in this document were discussed and addressed.  No physical exam could be performed with this format.  Please refer to the patient's chart for his consent to telehealth for [facility].  Evaluation Performed:  Preoperative cardiovascular risk assessment _____________   Date:  12/22/2021   Patient ID:  [name], [name] 09/10/1945, MRN [id number] Patient Location:  Home Provider location:   Office  Primary Care Provider:  Unk Pinto, MD Primary Cardiologist:  Thompson Grayer, MD  Chief Complaint / Patient Profile   76 y.o. y/o male with a h/o atrial fibrillation, hypertension, hyperlipidemia,  who is pending right reverse total shoulder replacement and presents today for telephonic preoperative cardiovascular risk assessment.  Past Medical History    Past Medical History:  Diagnosis Date   A-fib ([place]) 2018   on eliquis   Anxiety    Arthritis    Benign prostatic hyperplasia    BPH (benign prostatic hypertrophy)    Carpal tunnel syndrome, bilateral    Early cataract    GERD (gastroesophageal reflux disease)    History of SCC (squamous cell carcinoma) of skin 10/04/2016   HNP (herniated nucleus pulposus), cervical    Hyperlipidemia    Hypertension 2007   IBS (irritable bowel syndrome)    Obesity    Other abnormal glucose    Pneumonia    " walking"   Pre-diabetes    Vitamin D deficiency    Wears glasses    Past Surgical History:  Procedure Laterality Date   ANTERIOR CERVICAL DECOMP/DISCECTOMY FUSION  01-25-2002   C4 - C5   ANTERIOR CERVICAL DECOMP/DISCECTOMY  FUSION Bilateral 07/16/2018   Procedure: REMOVAL OF ANTERIOR CERVICAL PLATE, ANTERIOR CERVICAL DECOMPRESSION/DISCECTOMY FUSION CERVICAL THREE- CERVICAL FOUR;  Surgeon: Jovita Gamma, MD;  Location: [place];  Service: Neurosurgery;  Laterality: Bilateral;  REMOVAL OF ANTERIOR CERVICAL PLATE, ANTERIOR CERVICAL DECOMPRESSION/DISCECTOMY FUSION CERVICAL THREE- CERVICAL FOUR   ANTERIOR CRUCIATE LIGAMENT REPAIR  1990   RIGHT   APPENDECTOMY  02-07-2007   CARPAL TUNNEL RELEASE Right 07/16/2018   Procedure: CARPAL TUNNEL RELEASE;  Surgeon: Jovita Gamma, MD;  Location: [place];  Service: Neurosurgery;  Laterality: Right;  CARPAL TUNNEL RELEASE   CATARACT EXTRACTION Right 01/2020   CERVICAL FUSION  2000   C5 - 6   COLONOSCOPY  05-30-2011   HEMORRHOIDS   HERNIA REPAIR     HIP SURGERY  1999   LEFT HIP --  REMOVAL CALCIUM BUILD-UP   LEFT ANKLE SURG.  [place]   LEFT ROTATOR CUFF REPAIR  2002   LOOP RECORDER INSERTION N/A 09/19/2016   Procedure: Loop Recorder Insertion;  Surgeon: Thompson Grayer, MD;  Location: [place] [facility];  Service: Cardiovascular;  Laterality: N/A;   TENDON RECONSTRUCTION Left 07/17/2013   Procedure: LEFT ELBOW LATERAL RECONSTRUCTION;  Surgeon: Schuyler Amor, MD;  Location: [place];  Service: Orthopedics;  Laterality: Left;   TONSILLECTOMY     TRIGGER FINGER RELEASE Right 07/17/2013   Procedure: RELEASE A-1 PULLEY RIGHT RING FINGER;  Surgeon: Schuyler Amor, MD;  [facility];  Service: Orthopedics;  Laterality: Right;   UMBILICAL HERNIA REPAIR  2003   XI ROBOTIC ASSISTED SIMPLE PROSTATECTOMY N/A 05/22/2019   Procedure: XI ROBOTIC ASSISTED SIMPLE PROSTATECTOMY;  Surgeon: Alexis Frock, MD;  Location: [facility];  Service: Urology;  Laterality: N/A;  3 HRS    Allergies  Allergies  Allergen Reactions   Xarelto [Rivaroxaban] Other (See Comments)    Severe body aches   Atorvastatin Other (See Comments)     Myalgias   Clonazepam Other (See Comments)    "Made me jittery"   Tamsulosin Hcl Other (See Comments)    Hypotension    History of Present Illness    [name] is a 76 y.o. male who presents via audio/video conferencing for a telehealth visit today.  Pt was last seen in cardiology clinic on 02/22/2021 by Dr. Rayann Heman.  At that time [name] was doing well.  The patient is now pending procedure as outlined above. Since his last visit, he denies chest pain, shortness of breath, lower extremity edema, fatigue, palpitations, melena, hematuria, hemoptysis, diaphoresis, weakness, presyncope, syncope, orthopnea, and PND.  He continues to ride his stationary bike and has been working with a therapist for knee and shoulder issues.   Home Medications    Prior to Admission medications   Medication Sig Start Date End Date Taking? Authorizing Provider  apixaban (ELIQUIS) 5 MG TABS tablet TAKE 1 TABLET BY MOUTH  TWICE DAILY 09/16/21   Allred, Jeneen Rinks, MD  Cholecalciferol (VITAMIN D3) 5000 units CAPS Take 10,000 Units by mouth daily.     [provider]  diltiazem (CARDIZEM) 30 MG tablet Take 1 tablet every 4 hours AS NEEDED for AFIB heart rate >100 as long as blood pressure >100. 05/29/20   Sherran Needs, NP  escitalopram (LEXAPRO) 5 MG tablet Take 1 tablet (5 mg total) by mouth daily. 10/13/21   Alycia Rossetti, NP  finasteride (PROSCAR) 5 MG tablet Take 5 mg by mouth daily.    [provider]  Flaxseed, Linseed, (FLAX SEED OIL PO) Take 1 capsule by mouth daily.    [provider]  flecainide (TAMBOCOR) 100 MG tablet TAKE 1 TABLET BY MOUTH  TWICE DAILY 06/28/21   Allred, Jeneen Rinks, MD  hydrOXYzine (ATARAX) 50 MG tablet TAKE 1 TO 2 TABLETS 1 TO 2 HOURS BEFORE BEDTIME IF NEEDED FOR SLEEP 10/01/21   Liane Comber, NP  losartan (COZAAR) 50 MG tablet TAKE 1 TABLET BY MOUTH  DAILY FOR BLOOD PRESSURE 10/27/20   Liane Comber, NP  Multiple Vitamin (MULTIVITAMIN) tablet Take  1 tablet by mouth daily.    [provider]  Omega-3 Fatty Acids (FISH OIL PO) Take 1 capsule by mouth 2 (two) times daily.    [provider]  rosuvastatin (CRESTOR) 10 MG tablet Take  1 tablet  Daily  for Cholesterol 08/09/21   Unk Pinto, MD  verapamil (CALAN-SR) 120 MG CR tablet Take  1 tablet  Daily  with Food  for BP & Heart Rhythm 07/28/21   Unk Pinto, MD  vitamin C (ASCORBIC ACID) 500 MG tablet Take 500 mg by mouth daily.    [provider]  zinc gluconate 50 MG tablet Take 50 mg by mouth daily.    [provider]    Physical Exam    Vital Signs:  [name] does not have vital signs available for review today.  Given telephonic nature of communication, physical exam is limited. AAOx3. NAD. Normal affect.  Speech and respirations are unlabored.  Accessory Clinical Findings    None  Assessment & Plan    1.  Preoperative Cardiovascular Risk Assessment: He is doing well from a cardiac perspective and may proceed to surgery without further testing. According to the Revised Cardiac Risk Index (RCRI), his Perioperative Risk of Major Cardiac Event is (%): 0.9. His Functional Capacity in METs is: 5.62 according to the Duke Activity Status Index (DASI).  Most recently cleared on 11/05/20 to hold for 3 days prior to R TKA. Okay to hold Eliquis for 3 days prior to procedure   A copy of this note will be routed to requesting surgeon.  Time:   Today, I have spent 10 minutes with the patient with telehealth technology discussing medical history, symptoms, and management plan.     Emmaline Life, NP-C    12/22/2021, 12:14 PM [facility] [address] Office [phone] Fax [phone]

## 2021-12-23 ENCOUNTER — Ambulatory Visit: Payer: Medicare Other | Admitting: Nurse Practitioner

## 2021-12-23 ENCOUNTER — Encounter: Payer: Self-pay | Admitting: Nurse Practitioner

## 2021-12-23 ENCOUNTER — Ambulatory Visit (INDEPENDENT_AMBULATORY_CARE_PROVIDER_SITE_OTHER): Payer: Medicare Other | Admitting: Nurse Practitioner

## 2021-12-23 VITALS — BP 142/94 | HR 64 | Temp 96.9°F | Wt 232.4 lb

## 2021-12-23 DIAGNOSIS — I700 Atherosclerosis of aorta: Secondary | ICD-10-CM | POA: Diagnosis not present

## 2021-12-23 DIAGNOSIS — F419 Anxiety disorder, unspecified: Secondary | ICD-10-CM | POA: Diagnosis not present

## 2021-12-23 DIAGNOSIS — I10 Essential (primary) hypertension: Secondary | ICD-10-CM | POA: Diagnosis not present

## 2021-12-23 DIAGNOSIS — I480 Paroxysmal atrial fibrillation: Secondary | ICD-10-CM

## 2021-12-23 DIAGNOSIS — N401 Enlarged prostate with lower urinary tract symptoms: Secondary | ICD-10-CM | POA: Diagnosis not present

## 2021-12-23 DIAGNOSIS — R3914 Feeling of incomplete bladder emptying: Secondary | ICD-10-CM | POA: Diagnosis not present

## 2021-12-23 DIAGNOSIS — E782 Mixed hyperlipidemia: Secondary | ICD-10-CM | POA: Diagnosis not present

## 2021-12-23 DIAGNOSIS — Z01818 Encounter for other preprocedural examination: Secondary | ICD-10-CM

## 2021-12-23 MED ORDER — ROSUVASTATIN CALCIUM 20 MG PO TABS: ORAL_TABLET | ORAL | 3 refills | Status: DC

## 2021-12-23 MED ORDER — ESCITALOPRAM OXALATE 20 MG PO TABS: ORAL_TABLET | ORAL | 3 refills | Status: DC

## 2021-12-23 NOTE — Progress Notes (Signed)
Pre-operative Risk Assessment   Assessment and Plan:    1. Encounter for other preprocedural examination NSR  - EKG 12-Lead  2. Paroxysmal atrial fibrillation (HCC) Continue Apixaban until 48 hours p/t surgery unless otherwise indicated by surgeon.  3. Aortic atherosclerosis (HCC) by Chest CT scan on 10/11/2017 Continue Rosuvastatin, Omega 3.  4. Essential hypertension Continue Verapamil, Losartan, Diltiazem.  5. Benign prostatic hyperplasia with incomplete bladder emptying Continue Finasteride.  6. Hyperlipidemia, mixed Continue Rosuvastatin, Omega 3.  - rosuvastatin (CRESTOR) 20 MG tablet; Take  1/2 tablet (10 mg) daily  for Cholesterol  Dispense: 90 tablet; Refill: 3  7. Anxiety Controlled.  - escitalopram (LEXAPRO) 20 MG tablet; Take 1/2 tablet (10 mg) daily.  Dispense: 90 tablet; Refill: 3   Future Appointments  Date Time Provider [place]  03/03/2022  1:30 PM Baldwin Jamaica, PA-C [facility]  03/31/2022 11:00 AM Darrol Jump, NP [facility] None  08/02/2022 11:00 AM Unk Pinto, MD [facility] None  10/12/2022 11:00 AM Darrol Jump, NP [facility] None    ----------------------------------------------------------------------------------------------------------------------  HPI  Patient Name: [name]  DOB: December 26, 1945 MRN: [id number]{   Request for Surgical Clearance{    Procedure:  Right Reverse Total Shoulder Replacement  Date of Surgery:  Clearance for 01/06/22                             {   Surgeon:  Ophelia Charter, MD Surgeon's Group or Practice Name:  [facility] Phone number:  [phone] Fax number:  [phone]   Type of Clearance Requested:   - Pharmacy:  Hold Apixaban (Eliquis) At least 48 hours prior to surgery unless otherwise indicated by surgeon.  Type of Anesthesia:  General with Interscalene Block (ISB)  His blood pressure has not been controlled at home, today their BP is  BP: (!) 142/94  Patient is on Vitamin D supplement.   Lab Results  Component Value Date   VD25OH 82 07/28/2021     Current Medications:  Current Outpatient Medications on File Prior to Visit  Medication Sig   apixaban (ELIQUIS) 5 MG TABS tablet TAKE 1 TABLET BY MOUTH  TWICE DAILY   Cholecalciferol (VITAMIN D3) 5000 units CAPS Take 10,000 Units by mouth daily.    diltiazem (CARDIZEM) 30 MG tablet Take 1 tablet every 4 hours AS NEEDED for AFIB heart rate >100 as long as blood pressure >100.   finasteride (PROSCAR) 5 MG tablet Take 5 mg by mouth daily.   Flaxseed, Linseed, (FLAX SEED OIL PO) Take 1 capsule by mouth daily.   flecainide (TAMBOCOR) 100 MG tablet TAKE 1 TABLET BY MOUTH  TWICE DAILY   hydrOXYzine (ATARAX) 50 MG tablet TAKE 1 TO 2 TABLETS 1 TO 2 HOURS BEFORE BEDTIME IF NEEDED FOR SLEEP   losartan (COZAAR) 50 MG tablet TAKE 1 TABLET BY MOUTH  DAILY FOR BLOOD PRESSURE   Multiple Vitamin (MULTIVITAMIN) tablet Take 1 tablet by mouth daily.   Omega-3 Fatty Acids (FISH OIL PO) Take 1 capsule by mouth 2 (two) times daily.   verapamil (CALAN-SR) 120 MG CR tablet Take  1 tablet  Daily  with Food  for BP & Heart Rhythm   vitamin C (ASCORBIC ACID) 500 MG tablet Take 500 mg by mouth daily.   zinc gluconate 50 MG tablet Take 50 mg by mouth daily.   No current facility-administered medications on file prior to visit.     Allergies:  Allergies  Allergen Reactions   Xarelto [Rivaroxaban] Other (See Comments)    Severe body aches   Atorvastatin Other (See Comments)    Myalgias   Clonazepam Other (See Comments)    "Made me jittery"   Tamsulosin Hcl Other (See Comments)    Hypotension     Medical History:  Past Medical History:  Diagnosis Date   A-fib ([place]) 2018   on eliquis   Anxiety    Arthritis    Benign prostatic hyperplasia    BPH (benign prostatic hypertrophy)    Carpal tunnel syndrome, bilateral    Early cataract    GERD (gastroesophageal reflux disease)    History of  SCC (squamous cell carcinoma) of skin 10/04/2016   HNP (herniated nucleus pulposus), cervical    Hyperlipidemia    Hypertension 2007   IBS (irritable bowel syndrome)    Obesity    Other abnormal glucose    Pneumonia    " walking"   Pre-diabetes    Vitamin D deficiency    Wears glasses    Family history- Reviewed and unchanged Social history- Reviewed and unchanged   Review of Systems:  Review of Systems  Constitutional:  Negative for chills, diaphoresis, fever and malaise/fatigue.  HENT:  Negative for congestion, ear discharge, ear pain, nosebleeds, sinus pain, sore throat and tinnitus.   Eyes:  Negative for blurred vision, pain, discharge and redness.  Respiratory:  Negative for cough, hemoptysis, sputum production, shortness of breath and wheezing.   Cardiovascular:  Negative for chest pain, palpitations, orthopnea, leg swelling and PND.  Gastrointestinal:  Positive for constipation (occassional). Negative for abdominal pain, blood in stool, diarrhea, heartburn, melena, nausea and vomiting.  Genitourinary:  Negative for dysuria, flank pain, frequency, hematuria and urgency.  Musculoskeletal:  Positive for joint pain (right shoulder). Negative for back pain, falls, myalgias and neck pain.  Skin:  Negative for itching and rash.  Neurological:  Negative for dizziness, tingling, tremors, sensory change, speech change, focal weakness, seizures, loss of consciousness, weakness and headaches.  Endo/Heme/Allergies:  Negative for polydipsia. Does not bruise/bleed easily.  Psychiatric/Behavioral:  Negative for depression and hallucinations. The patient is not nervous/anxious and does not have insomnia.    Physical Exam: BP (!) 142/94   Pulse 64   Temp (!) 96.9 F (36.1 C)   Wt 232 lb 6.4 oz (105.4 kg)   SpO2 98%   BMI 31.52 kg/m  Wt Readings from Last 3 Encounters:  12/23/21 232 lb 6.4 oz (105.4 kg)  11/29/21 229 lb 12.8 oz (104.2 kg)  10/13/21 231 lb 3.2 oz (104.9 kg)   General  Appearance: Well nourished, in no apparent distress. Eyes: PERRLA, EOMs, conjunctiva no swelling or erythema Sinuses: No Frontal/maxillary tenderness ENT/Mouth: Ext aud canals clear, TMs without erythema, bulging. No erythema, swelling, or exudate on post pharynx.  Tonsils not swollen or erythematous. Hearing normal.  Neck: Supple, thyroid normal.  Respiratory: Respiratory effort normal, BS equal bilaterally without rales, rhonchi, wheezing or stridor.  Cardio: RRR with no MRGs. Brisk peripheral pulses without edema.  Abdomen: Soft, + BS.  Non tender, no guarding, rebound, hernias, masses. Lymphatics: Non tender without lymphadenopathy.  Musculoskeletal:  LROM RUE. 5/5 strength, Normal gait Skin: Warm, dry without rashes, lesions, ecchymosis.  Neuro: Cranial nerves intact. No cerebellar symptoms.  Psych: Awake and oriented X 3, normal affect, Insight and Judgment appropriate.   Francesco Sor Deandria Klute   12/23/2021, 8:58 AM

## 2021-12-23 NOTE — Patient Instructions (Signed)
Pain Relief Before and After Surgery  Pain relief is an important part of your overall care before, during, and after surgery. You and your health care provider will work together to make a plan to manage any pain that you have before surgery (preoperative) and after surgery (postoperative). Addressing pain before surgery lessens the pain that you will have after surgery. Make sure that you fully understand and agree with your pain relief plan. If you have questions or concerns, it is important to discuss them with your health care provider. If you have pain that is not controlled by medicine, tell your health care provider. Severe pain after surgery may: Prevent sleep. Decrease your ability to breathe deeply and to cough. This can result in pneumonia or upper airway infections. Cause your heart to beat more quickly. Cause your blood pressure to be higher. Increase your risk for stomach and digestive problems. Slow down wound healing. Lead to depression, anxiety, and feelings of helplessness. Your health care provider may use more than one method at a time to help relieve your pain. Using this approach may allow you to eat, move around, and possibly leave the hospital sooner. What are options for managing pain before and after surgery? Oral pain medicines Pain medicines taken by mouth (orally) include: Non-narcotic medicines: Acetaminophen. NSAIDs, such as ibuprofen and naproxen. Muscle relaxants. These may relieve pain caused by muscle spasms. Anticonvulsants. These medicines are usually used to treat seizures. They may help to lessen nerve pain. Opioids. These medicines relieve pain by binding to pain receptors in the brain and spinal cord (narcotic pain medicines). Opioids may help relieve short-term (acute) postoperative pain that is moderate to moderately severe. Opioids are often combined with non-narcotic medicines to improve pain relief, lower the risk of side effects, and lower the  chance of addiction. Some common side effects of opioids include constipation, nausea, and excessive sleepiness. To help prevent addiction, opioids are given for short periods of time in careful doses. If you follow instructions from your health care provider and you do not have a history of substance abuse, your risk of becoming addicted to opioids is low. Some of these medicines may be available in injectable form. They may be given through an IV if you are unable to eat or drink. As-needed pain control You can receive pain medicine when you need it, through an IV or as a pill or liquid. When you tell your health care provider that you are having pain, he or she will give you the proper pain medicine. Medicine that numbs an area You may be given pain medicine that numbs an area. This is called a local anesthetic. It may be given: As an injection near your painful area (local infiltration). As an injection near the nerve that provides feeling to a specific part of your body (peripheral nerve block). As an injection in your spine (spinal block). Through a local anesthetic reservoir pump. For this method, one or more small, thin tubes (catheters) are inserted into your incision at the end of your procedure. These catheters are connected to a device that is filled with a non-narcotic pain medicine. Medicine gradually empties into your incision area over the next several days. Continuous epidural pain control With this method, you receive pain medicine through a catheter that is inserted into your back, near your spinal cord. Medicine flows through the catheter to lessen pain in areas of your body that are below the catheter. The catheter is usually put into the back  shortly before surgery. It may be left in until you can eat, take medicine by mouth, pass urine, and have a bowel movement. This method may be recommended if you are having surgery on your abdomen, hip area, or legs. This method of pain  relief may help you heal faster because you may be able to do these things sooner: Regain normal bowel and bladder function. Return to eating. Get up and walk. IV patient-controlled analgesia (PCA) pump With this method, you receive pain medicine through an IV that is connected to a PCA pump. The PCA pump gives you a specific amount of medicine when you push a button. This lets you control how much medicine you receive. You are the only person who should push this button. The pump is set up so that you cannot accidentally give yourself too much medicine. You will be able to start using your PCA pump in the recovery room after your procedure. Tell your health care provider: If you are having too much pain. If you cannot push the button. If you are feeling too sleepy or nauseous. Other pain control methods Other methods of pain relief after surgery include: Heat and cold therapy. Massage. Topical analgesics. These are patches, creams, and gels that can be applied on the skin. Steroid medicines. These medicines may be given to lessen swelling. Physical therapy. A physical therapist will work with you to meet goals, such as feeling and functioning better. Physical therapy usually includes specific exercises that are tailored to your needs. Transcutaneous electrical nerve stimulation (TENS). This method sends electrical signals through the skin to interrupt pain signals. Cognitive behavioral therapy (CBT). This therapy helps you learn coping skills for dealing with pain. What are some questions to ask my health care provider? What pain relief options would be best for me? What are the risks of each option? What are the benefits of each option? How long will I need pain relief after surgery? Summary A plan to manage pain that you may have before surgery (preoperative) and after surgery (postoperative) is an important part of your overall care. Pain management options include medicines and  nonmedical therapies, such as physical therapy, massage, and heat or cold therapy. Pain management medicines include opioids and non-narcotic medicines such as NSAIDs, steroids, or local anesthetics. Pain medicines can have side effects. Side effects of opioids include constipation, nausea, excessive sleepiness, and risk of addiction. Your health care provider will work with you to prevent or manage these side effects and risks. This information is not intended to replace advice given to you by your health care provider. Make sure you discuss any questions you have with your health care provider. Document Revised: 09/24/2020 Document Reviewed: 09/24/2020 Elsevier Patient Education  [place].

## 2021-12-26 ENCOUNTER — Other Ambulatory Visit: Payer: Self-pay | Admitting: Nurse Practitioner

## 2021-12-26 DIAGNOSIS — E782 Mixed hyperlipidemia: Secondary | ICD-10-CM

## 2021-12-28 ENCOUNTER — Other Ambulatory Visit: Payer: Self-pay | Admitting: Adult Health

## 2021-12-28 NOTE — Care Plan (Signed)
Ortho Bundle Case Management Note  Patient Details  Name: [name] MRN: [id number] Date of Birth: [birth year]    Spoke with patient prior to surgery. Will discharge to home with family. Will get sling and CTU at the surgery center. OPPT set up with [facility]. Patient and MD in agreement with plan. Choice offered                  DME Arranged:    DME Agency:     HH Arranged:    HH Agency:     Additional Comments: Please contact me with any questions of if this plan should need to change.  Ladell Heads,  [place] [facility]  [phone] 12/28/2021, 6:07 PM

## 2022-01-03 NOTE — H&P (Signed)
PREOPERATIVE H&P  Chief Complaint: djd right shoulder  HPI: [name] is a 76 y.o. male who is scheduled for Procedure(s): REVERSE SHOULDER ARTHROPLASTY.   Patient has a past medical history significant for GERD, HTN, HLD, HNP, IBS.   The patient has a massive cuff tear and was trying to do motion on his own.  He has had multiple dislocations since his last visit.  He is unhappy with the shoulder. He is frustrated by his right shoulder function.    Symptoms are rated as moderate to severe, and have been worsening.  This is significantly impairing activities of daily living.    Please see clinic note for further details on this patient's care.    He has elected for surgical management.   Past Medical History:  Diagnosis Date   A-fib ([place]) 2018   on eliquis   Anxiety    Arthritis    Benign prostatic hyperplasia    BPH (benign prostatic hypertrophy)    Carpal tunnel syndrome, bilateral    Early cataract    GERD (gastroesophageal reflux disease)    History of SCC (squamous cell carcinoma) of skin 10/04/2016   HNP (herniated nucleus pulposus), cervical    Hyperlipidemia    Hypertension 2007   IBS (irritable bowel syndrome)    Obesity    Other abnormal glucose    Pneumonia    " walking"   Pre-diabetes    Vitamin D deficiency    Wears glasses    Past Surgical History:  Procedure Laterality Date   ANTERIOR CERVICAL DECOMP/DISCECTOMY FUSION  01-25-2002   C4 - C5   ANTERIOR CERVICAL DECOMP/DISCECTOMY FUSION Bilateral 07/16/2018   Procedure: REMOVAL OF ANTERIOR CERVICAL PLATE, ANTERIOR CERVICAL DECOMPRESSION/DISCECTOMY FUSION CERVICAL THREE- CERVICAL FOUR;  Surgeon: Jovita Gamma, MD;  Location: [facility];  Service: Neurosurgery;  Laterality: Bilateral;  REMOVAL OF ANTERIOR CERVICAL PLATE, ANTERIOR CERVICAL DECOMPRESSION/DISCECTOMY FUSION CERVICAL THREE- CERVICAL FOUR   ANTERIOR CRUCIATE LIGAMENT REPAIR  1990   RIGHT   APPENDECTOMY  02-07-2007   CARPAL TUNNEL  RELEASE Right 07/16/2018   Procedure: CARPAL TUNNEL RELEASE;  Surgeon: Jovita Gamma, MD;  Location: [facility];  Service: Neurosurgery;  Laterality: Right;  CARPAL TUNNEL RELEASE   CATARACT EXTRACTION Right 01/2020   CERVICAL FUSION  2000   C5 - 6   COLONOSCOPY  05-30-2011   HEMORRHOIDS   HERNIA REPAIR     HIP SURGERY  1999   LEFT HIP --  REMOVAL CALCIUM BUILD-UP   LEFT ANKLE SURG.  [place]   LEFT ROTATOR CUFF REPAIR  2002   LOOP RECORDER INSERTION N/A 09/19/2016   Procedure: Loop Recorder Insertion;  Surgeon: Thompson Grayer, MD;  Location: [facility];  Service: Cardiovascular;  Laterality: N/A;   TENDON RECONSTRUCTION Left 07/17/2013   Procedure: LEFT ELBOW LATERAL RECONSTRUCTION;  Surgeon: Schuyler Amor, MD;  Location: [facility];  Service: Orthopedics;  Laterality: Left;   TONSILLECTOMY     TRIGGER FINGER RELEASE Right 07/17/2013   Procedure: RELEASE A-1 PULLEY RIGHT RING FINGER;  Surgeon: Schuyler Amor, MD;  Location: [facility];  Service: Orthopedics;  Laterality: Right;   UMBILICAL HERNIA REPAIR  2003   XI ROBOTIC ASSISTED SIMPLE PROSTATECTOMY N/A 05/22/2019   Procedure: XI ROBOTIC ASSISTED SIMPLE PROSTATECTOMY;  Surgeon: Alexis Frock, MD;  Location: [facility];  Service: Urology;  Laterality: N/A;  3 HRS   Social History   Socioeconomic History  Marital status: Married    Spouse name: Not on file   Number of children: 1   Years of education: Not on file   Highest education level: Not on file  Occupational History   Occupation: Personnel officer  Tobacco Use   Smoking status: Never   Smokeless tobacco: Never  Vaping Use   Vaping Use: Never used  Substance and Sexual Activity   Alcohol use: Yes    Alcohol/week: 4.0 standard drinks of alcohol    Types: 4 Standard drinks or equivalent per week    Comment: OCCASIONAL   Drug use: No   Sexual activity: Not on file  Other Topics Concern   Not on  file  Social History Narrative   Not on file   Social Determinants of Health   Financial Resource Strain: Low Risk  (05/22/2019)   Overall Financial Resource Strain (CARDIA)    Difficulty of Paying Living Expenses: Not hard at all  Food Insecurity: No Food Insecurity (05/22/2019)   Hunger Vital Sign    Worried About Running Out of Food in the Last Year: Never true    Ran Out of Food in the Last Year: Never true  Transportation Needs: No Transportation Needs (05/22/2019)   PRAPARE - Hydrologist (Medical): No    Lack of Transportation (Non-Medical): No  Physical Activity: Inactive (05/22/2019)   Exercise Vital Sign    Days of Exercise per Week: 0 days    Minutes of Exercise per Session: 0 min  Stress: No Stress Concern Present (05/22/2019)   [place]    Feeling of Stress : Only a little  Social Connections: Socially Integrated (05/22/2019)   Social Connection and Isolation Panel [NHANES]    Frequency of Communication with Friends and Family: More than three times a week    Frequency of Social Gatherings with Friends and Family: Once a week    Attends Religious Services: More than 4 times per year    Active Member of [employer] or Organizations: Yes    Attends Music therapist: More than 4 times per year    Marital Status: Married   Family History  Problem Relation Age of Onset   Heart disease Mother    Stroke Mother 71   Heart disease Father    Diabetes Father    Heart attack Father 98   Alcohol abuse Father    Allergies  Allergen Reactions   Xarelto [Rivaroxaban] Other (See Comments)    Severe body aches   Atorvastatin Other (See Comments)    Myalgias   Clonazepam Other (See Comments)    "Made me jittery"   Tamsulosin Hcl Other (See Comments)    Hypotension   Prior to Admission medications   Medication Sig Start Date End Date Taking? Authorizing Provider   apixaban (ELIQUIS) 5 MG TABS tablet TAKE 1 TABLET BY MOUTH  TWICE DAILY 09/16/21  Yes Allred, Jeneen Rinks, MD  Cholecalciferol (VITAMIN D3) 5000 units CAPS Take 10,000 Units by mouth daily.    Yes [provider]  finasteride (PROSCAR) 5 MG tablet Take 5 mg by mouth daily.   Yes [provider]  Flaxseed, Linseed, (FLAX SEED OIL PO) Take 1 capsule by mouth daily.   Yes [provider]  flecainide (TAMBOCOR) 100 MG tablet TAKE 1 TABLET BY MOUTH  TWICE DAILY 06/28/21  Yes Allred, Jeneen Rinks, MD  hydrOXYzine (ATARAX) 50 MG tablet TAKE 1 TO 2 TABLETS 1 TO  2 HOURS BEFORE BEDTIME IF NEEDED FOR SLEEP 10/01/21  Yes Liane Comber, NP  Multiple Vitamin (MULTIVITAMIN) tablet Take 1 tablet by mouth daily.   Yes [provider]  Omega-3 Fatty Acids (FISH OIL PO) Take 1 capsule by mouth 2 (two) times daily.   Yes [provider]  verapamil (CALAN-SR) 120 MG CR tablet Take  1 tablet  Daily  with Food  for BP & Heart Rhythm 07/28/21  Yes Unk Pinto, MD  vitamin C (ASCORBIC ACID) 500 MG tablet Take 500 mg by mouth daily.   Yes [provider]  zinc gluconate 50 MG tablet Take 50 mg by mouth daily.   Yes [provider]  diltiazem (CARDIZEM) 30 MG tablet Take 1 tablet every 4 hours AS NEEDED for AFIB heart rate >100 as long as blood pressure >100. 05/29/20   Sherran Needs, NP  escitalopram (LEXAPRO) 20 MG tablet Take 1/2 tablet (10 mg) daily. 12/23/21   Darrol Jump, NP  losartan (COZAAR) 50 MG tablet TAKE 1 TABLET BY MOUTH  DAILY FOR BLOOD PRESSURE 12/28/21   Alycia Rossetti, NP  rosuvastatin (CRESTOR) 20 MG tablet Take  1/2 tablet (10 mg) daily  for Cholesterol 12/23/21   Darrol Jump, NP    ROS: All other systems have been reviewed and were otherwise negative with the exception of those mentioned in the HPI and as above.  Physical Exam: General: Alert, no acute distress Cardiovascular: No pedal edema Respiratory: No cyanosis, no use of  accessory musculature GI: No organomegaly, abdomen is soft and non-tender Skin: No lesions in the area of chief complaint Neurologic: Sensation intact distally Psychiatric: Patient is competent for consent with normal mood and affect Lymphatic: No axillary or cervical lymphadenopathy  MUSCULOSKELETAL:  Right shoulder range of motion is limited in testing secondary to apprehension and pain.  Further testing is deferred in the setting of his known dislocations.    Imaging: MRI demonstrates a massive irreparable cuff tear.  BMI: Estimated body mass index is 31.46 kg/m as calculated from the following:   Height as of this encounter: 6' (1.829 m).   Weight as of this encounter: 105.2 kg.  Lab Results  Component Value Date   ALBUMIN 4.2 02/21/2018   Diabetes: Patient does not have a diagnosis of diabetes. Lab Results  Component Value Date   HGBA1C 5.3 07/28/2021     Smoking Status:   reports that he has never smoked. He has never used smokeless tobacco.     Assessment: djd right shoulder  Plan: Plan for Procedure(s): REVERSE SHOULDER ARTHROPLASTY  The risks benefits and alternatives were discussed with the patient including but not limited to the risks of nonoperative treatment, versus surgical intervention including infection, bleeding, nerve injury,  blood clots, cardiopulmonary complications, morbidity, mortality, among others, and they were willing to proceed.   We additionally specifically discussed risks of axillary nerve injury, infection, periprosthetic fracture, continued pain and longevity of implants prior to beginning procedure.    Patient will be closely monitored in PACU for medical stabilization and pain control. If found stable in PACU, patient may be discharged home with outpatient follow-up. If any concerns regarding patient's stabilization patient will be admitted for observation after surgery. The patient is planning to be discharged home with outpatient  PT.   The patient acknowledged the explanation, agreed to proceed with the plan and consent was signed.   He received operative clearance from his PCP, Dr. Melford Aase, and cardiology, Dr. Rayann Heman.   Operative  Plan: Right reverse total shoulder arthroplasty Discharge Medications: Standard DVT Prophylaxis: Resume Eliquis Physical Therapy: Outpatient PT - 7/5 '@1'$ :78 W/RG Special Discharge needs: Sling. Pyatt, PA-C  01/03/2022 5:12 PM

## 2022-01-03 NOTE — Discharge Instructions (Addendum)
Ophelia Charter MD, MPH Noemi Chapel, PA-C [facility] [address], [address] [phone] (tel)   [phone] (fax)   [place] may leave the operative dressing in place until your follow-up appointment. KEEP THE INCISIONS CLEAN AND DRY. There may be a small amount of fluid/bleeding leaking at the surgical site. This is normal after surgery.  If it fills with liquid or blood please call us immediately to change it for you. Use the provided ice machine or Ice packs as often as possible for the first 3-4 days, then as needed for pain relief.   Keep a layer of cloth or a shirt between your skin and the cooling unit to prevent frost bite as it can get very cold.  SHOWERING: - You may shower on Post-Op Day #2.  - The dressing is water resistant but do not scrub it as it may start to peel up.   - You may remove the sling for showering - Gently pat the area dry.  - Do not soak the shoulder in water.  - Do not go swimming in the pool or ocean until your incision has completely healed (about 4-6 weeks after surgery) - KEEP THE INCISIONS CLEAN AND DRY.  EXERCISES Wear the sling at all times  You may remove the sling for showering, but keep the arm across the chest or in a secondary sling.    Accidental/Purposeful External Rotation and shoulder flexion (reaching behind you) is to be avoided at all costs for the first month. It is ok to come out of your sling if your are sitting and have assistance for eating.   Do not lift anything heavier than 1 pound until we discuss it further in clinic.  It is normal for your fingers/hand to become more swollen after surgery and discolored from bruising.   This will resolve over the first few weeks usually after surgery. Please continue to ambulate and do not stay sitting or lying for too long.  Perform foot and wrist pumps to assist in circulation.  PHYSICAL  THERAPY - You will begin physical therapy soon after surgery (unless otherwise specified) - Please call to set up an appointment, if you do not already have one  - Let our office if there are any issues with scheduling your therapy  - You have a physical therapy appointment scheduled at [facility] (across the hall from our office) on Wednesday, July 5th at 1:45 pm   [place] ([place]) The anesthesia team may have performed a nerve block for you this is a great tool used to minimize pain.   The block may start wearing off overnight (between 8-24 hours postop) When the block wears off, your pain may go from nearly zero to the pain you would have had postop without the block. This is an abrupt transition but nothing dangerous is happening.   This can be a challenging period but utilize your as needed pain medications to try and manage this period. We suggest you use the pain medication the first night prior to going to bed, to ease this transition.  You may take an extra dose of narcotic when this happens if needed   POST-OP MEDICATIONS- Multimodal approach to pain control In general your pain will be controlled with a combination of substances.  Prescriptions unless otherwise discussed are electronically sent to your pharmacy.  This is a carefully made plan we use to minimize  narcotic use.     Acetaminophen - Non-narcotic pain medicine taken on a scheduled basis  Oxycodone - This is a strong narcotic, to be used only on an "as needed" basis for SEVERE pain. Zofran -  take as needed for nausea  You may resume Eliquis 24 hours after surgery  FOLLOW-UP If you develop a Fever (>101.5), Redness or Drainage from the surgical incision site, please call our office to arrange for an evaluation. Please call the office to schedule a follow-up appointment for a wound check, 7-10 days post-operatively.  IF YOU HAVE ANY QUESTIONS, PLEASE FEEL FREE TO CALL OUR OFFICE.  HELPFUL  INFORMATION  Your arm will be in a sling following surgery. You will be in this sling for the next 4 weeks.   You may be more comfortable sleeping in a semi-seated position the first few nights following surgery.  Keep a pillow propped under the elbow and forearm for comfort.  If you have a recliner type of chair it might be beneficial.  If not that is fine too, but it would be helpful to sleep propped up with pillows behind your operated shoulder as well under your elbow and forearm.  This will reduce pulling on the suture lines.  When dressing, put your operative arm in the sleeve first.  When getting undressed, take your operative arm out last.  Loose fitting, button-down shirts are recommended.  In most states it is against the law to drive while your arm is in a sling. And certainly against the law to drive while taking narcotics.  You may return to work/school in the next couple of days when you feel up to it. Desk work and typing in the sling is fine.  We suggest you use the pain medication the first night prior to going to bed, in order to ease any pain when the anesthesia wears off. You should avoid taking pain medications on an empty stomach as it will make you nauseous.  You should wean off your narcotic medicines as soon as you are able.     Most patients will be off or using minimal narcotics before their first postop appointment.   Do not drink alcoholic beverages or take illicit drugs when taking pain medications.  Pain medication may make you constipated.  Below are a few solutions to try in this order: Decrease the amount of pain medication if you aren't having pain. Drink lots of decaffeinated fluids. Drink prune juice and/or each dried prunes  If the first 3 don't work start with additional solutions Take Colace - an over-the-counter stool softener Take Senokot - an over-the-counter laxative Take Miralax - a stronger over-the-counter laxative   Dental  Antibiotics:  In most cases prophylactic antibiotics for Dental procdeures after total joint surgery are not necessary.  Exceptions are as follows:  1. History of prior total joint infection  2. Severely immunocompromised (Organ Transplant, cancer chemotherapy, Rheumatoid biologic meds such as [place])  3. Poorly controlled diabetes (A1C &gt; 8.0, blood glucose over 200)  If you have one of these conditions, contact your surgeon for an antibiotic prescription, prior to your dental procedure.   For more information including helpful videos and documents visit our website:   [email or web address]     Post Anesthesia Home Care Instructions  Activity: Get plenty of rest for the remainder of the day. A responsible individual must stay with you for 24 hours following the procedure.  For the next 24 hours, DO NOT: -Drive a car -Operate  machinery -Drink alcoholic beverages -Take any medication unless instructed by your physician -Make any legal decisions or sign important papers.  Meals: Start with liquid foods such as gelatin or soup. Progress to regular foods as tolerated. Avoid greasy, spicy, heavy foods. If nausea and/or vomiting occur, drink only clear liquids until the nausea and/or vomiting subsides. Call your physician if vomiting continues.  Special Instructions/Symptoms: Your throat may feel dry or sore from the anesthesia or the breathing tube placed in your throat during surgery. If this causes discomfort, gargle with warm salt water. The discomfort should disappear within 24 hours.  Regional Anesthesia Blocks  1. Numbness or the inability to move the "blocked" extremity may last from 3-48 hours after placement. The length of time depends on the medication injected and your individual response to the medication. If the numbness is not going away after 48 hours, call your surgeon.  2. The extremity that is blocked will need to be protected  until the numbness is gone and the  Strength has returned. Because you cannot feel it, you will need to take extra care to avoid injury. Because it may be weak, you may have difficulty moving it or using it. You may not know what position it is in without looking at it while the block is in effect.  3. For blocks in the legs and feet, returning to weight bearing and walking needs to be done carefully. You will need to wait until the numbness is entirely gone and the strength has returned. You should be able to move your leg and foot normally before you try and bear weight or walk. You will need someone to be with you when you first try to ensure you do not fall and possibly risk injury.  4. Bruising and tenderness at the needle site are common side effects and will resolve in a few days.  5. Persistent numbness or new problems with movement should be communicated to the surgeon or the [facility] [phone] [place] [phone]).   Information for Discharge Teaching: EXPAREL (bupivacaine liposome injectable suspension)   Your surgeon or anesthesiologist gave you EXPAREL(bupivacaine) to help control your pain after surgery.  EXPAREL is a local anesthetic that provides pain relief by numbing the tissue around the surgical site. EXPAREL is designed to release pain medication over time and can control pain for up to 72 hours. Depending on how you respond to EXPAREL, you may require less pain medication during your recovery.  Possible side effects: Temporary loss of sensation or ability to move in the area where bupivacaine was injected. Nausea, vomiting, constipation Rarely, numbness and tingling in your mouth or lips, lightheadedness, or anxiety may occur. Call your doctor right away if you think you may be experiencing any of these sensations, or if you have other questions regarding possible side effects.  Follow all other discharge instructions given to you by  your surgeon or nurse. Eat a healthy diet and drink plenty of water or other fluids.  If you return to the hospital for any reason within 96 hours following the administration of EXPAREL, it is important for health care providers to know that you have received this anesthetic. A teal colored band has been placed on your arm with the date, time and amount of EXPAREL you have received in order to alert and inform your health care providers. Please leave this armband in place for the full 96 hours following administration, and then you may remove the  band.     [facility] Ultrasling III (Red ball):  Please contact your surgeon if you have questions or concerns about your sling.

## 2022-01-04 ENCOUNTER — Encounter (HOSPITAL_BASED_OUTPATIENT_CLINIC_OR_DEPARTMENT_OTHER)
Admission: RE | Admit: 2022-01-04 | Discharge: 2022-01-04 | Disposition: A | Discharge status: Home / Self Care | Payer: Medicare Other | Source: Ambulatory Visit | Attending: Orthopaedic Surgery | Admitting: Orthopaedic Surgery

## 2022-01-04 DIAGNOSIS — X58XXXA Exposure to other specified factors, initial encounter: Secondary | ICD-10-CM | POA: Diagnosis not present

## 2022-01-04 DIAGNOSIS — Z01818 Encounter for other preprocedural examination: Secondary | ICD-10-CM | POA: Insufficient documentation

## 2022-01-04 DIAGNOSIS — I10 Essential (primary) hypertension: Secondary | ICD-10-CM | POA: Diagnosis not present

## 2022-01-04 DIAGNOSIS — M25311 Other instability, right shoulder: Secondary | ICD-10-CM | POA: Diagnosis not present

## 2022-01-04 DIAGNOSIS — E785 Hyperlipidemia, unspecified: Secondary | ICD-10-CM | POA: Diagnosis not present

## 2022-01-04 DIAGNOSIS — E669 Obesity, unspecified: Secondary | ICD-10-CM | POA: Diagnosis not present

## 2022-01-04 DIAGNOSIS — Z79899 Other long term (current) drug therapy: Secondary | ICD-10-CM | POA: Diagnosis not present

## 2022-01-04 DIAGNOSIS — M75101 Unspecified rotator cuff tear or rupture of right shoulder, not specified as traumatic: Secondary | ICD-10-CM | POA: Diagnosis not present

## 2022-01-04 DIAGNOSIS — I4891 Unspecified atrial fibrillation: Secondary | ICD-10-CM | POA: Diagnosis not present

## 2022-01-04 DIAGNOSIS — Z6831 Body mass index (BMI) 31.0-31.9, adult: Secondary | ICD-10-CM | POA: Diagnosis not present

## 2022-01-04 DIAGNOSIS — F419 Anxiety disorder, unspecified: Secondary | ICD-10-CM | POA: Diagnosis not present

## 2022-01-04 LAB — SURGICAL PCR SCREEN
MRSA, PCR: NEGATIVE
Staphylococcus aureus: NEGATIVE

## 2022-01-06 ENCOUNTER — Other Ambulatory Visit: Payer: Self-pay

## 2022-01-06 ENCOUNTER — Encounter (HOSPITAL_BASED_OUTPATIENT_CLINIC_OR_DEPARTMENT_OTHER)
Admission: RE | Disposition: A | Discharge status: Home / Self Care | Payer: Self-pay | Source: Home / Self Care | Attending: Orthopaedic Surgery

## 2022-01-06 ENCOUNTER — Ambulatory Visit (HOSPITAL_COMMUNITY): Payer: Medicare Other

## 2022-01-06 ENCOUNTER — Ambulatory Visit (HOSPITAL_BASED_OUTPATIENT_CLINIC_OR_DEPARTMENT_OTHER): Payer: Medicare Other | Admitting: Certified Registered"

## 2022-01-06 ENCOUNTER — Encounter (HOSPITAL_BASED_OUTPATIENT_CLINIC_OR_DEPARTMENT_OTHER): Payer: Self-pay | Admitting: Orthopaedic Surgery

## 2022-01-06 ENCOUNTER — Ambulatory Visit (HOSPITAL_BASED_OUTPATIENT_CLINIC_OR_DEPARTMENT_OTHER)
Admission: RE | Admit: 2022-01-06 | Discharge: 2022-01-06 | Disposition: A | Discharge status: Home / Self Care | Payer: Medicare Other | Attending: Orthopaedic Surgery | Admitting: Orthopaedic Surgery

## 2022-01-06 DIAGNOSIS — E785 Hyperlipidemia, unspecified: Secondary | ICD-10-CM | POA: Diagnosis not present

## 2022-01-06 DIAGNOSIS — Z79899 Other long term (current) drug therapy: Secondary | ICD-10-CM | POA: Insufficient documentation

## 2022-01-06 DIAGNOSIS — S43004A Unspecified dislocation of right shoulder joint, initial encounter: Secondary | ICD-10-CM | POA: Diagnosis not present

## 2022-01-06 DIAGNOSIS — M75101 Unspecified rotator cuff tear or rupture of right shoulder, not specified as traumatic: Secondary | ICD-10-CM | POA: Diagnosis not present

## 2022-01-06 DIAGNOSIS — Z01818 Encounter for other preprocedural examination: Secondary | ICD-10-CM

## 2022-01-06 DIAGNOSIS — X58XXXA Exposure to other specified factors, initial encounter: Secondary | ICD-10-CM | POA: Insufficient documentation

## 2022-01-06 DIAGNOSIS — M25311 Other instability, right shoulder: Secondary | ICD-10-CM | POA: Diagnosis not present

## 2022-01-06 DIAGNOSIS — M19011 Primary osteoarthritis, right shoulder: Secondary | ICD-10-CM | POA: Diagnosis not present

## 2022-01-06 DIAGNOSIS — Z6831 Body mass index (BMI) 31.0-31.9, adult: Secondary | ICD-10-CM | POA: Insufficient documentation

## 2022-01-06 DIAGNOSIS — E669 Obesity, unspecified: Secondary | ICD-10-CM | POA: Insufficient documentation

## 2022-01-06 DIAGNOSIS — F419 Anxiety disorder, unspecified: Secondary | ICD-10-CM | POA: Diagnosis not present

## 2022-01-06 DIAGNOSIS — I10 Essential (primary) hypertension: Secondary | ICD-10-CM | POA: Insufficient documentation

## 2022-01-06 DIAGNOSIS — Z96611 Presence of right artificial shoulder joint: Secondary | ICD-10-CM | POA: Diagnosis not present

## 2022-01-06 DIAGNOSIS — G8918 Other acute postprocedural pain: Secondary | ICD-10-CM | POA: Diagnosis not present

## 2022-01-06 DIAGNOSIS — I4891 Unspecified atrial fibrillation: Secondary | ICD-10-CM | POA: Insufficient documentation

## 2022-01-06 DIAGNOSIS — Z471 Aftercare following joint replacement surgery: Secondary | ICD-10-CM | POA: Diagnosis not present

## 2022-01-06 HISTORY — DX: Anxiety disorder, unspecified: F41.9

## 2022-01-06 HISTORY — DX: Gastro-esophageal reflux disease without esophagitis: K21.9

## 2022-01-06 HISTORY — PX: REVERSE SHOULDER ARTHROPLASTY: SHX5054

## 2022-01-06 SURGERY — ARTHROPLASTY, SHOULDER, TOTAL, REVERSE
Anesthesia: General | Site: Shoulder | Laterality: Right

## 2022-01-06 MED ORDER — FENTANYL CITRATE (PF) 100 MCG/2ML IJ SOLN
25.0000 ug | INTRAMUSCULAR | Status: DC | PRN
  Administered 2022-01-06: 50 ug via INTRAVENOUS

## 2022-01-06 MED ORDER — TRANEXAMIC ACID-NACL 1000-0.7 MG/100ML-% IV SOLN
1000.0000 mg | INTRAVENOUS | Status: AC
  Administered 2022-01-06: 1000 mg via INTRAVENOUS

## 2022-01-06 MED ORDER — PHENYLEPHRINE HCL-NACL 20-0.9 MG/250ML-% IV SOLN
INTRAVENOUS | Status: DC | PRN
  Administered 2022-01-06: 25 ug/min via INTRAVENOUS

## 2022-01-06 MED ORDER — OXYCODONE HCL 5 MG/5ML PO SOLN: 5.0000 mg | Freq: Once | ORAL | Status: DC | PRN

## 2022-01-06 MED ORDER — ONDANSETRON HCL 4 MG/2ML IJ SOLN
INTRAMUSCULAR | Status: DC | PRN
  Administered 2022-01-06: 4 mg via INTRAVENOUS

## 2022-01-06 MED ORDER — LIDOCAINE 2% (20 MG/ML) 5 ML SYRINGE
INTRAMUSCULAR | Status: AC
  Filled 2022-01-06: qty 5

## 2022-01-06 MED ORDER — MIDAZOLAM HCL 2 MG/2ML IJ SOLN
INTRAMUSCULAR | Status: AC
  Filled 2022-01-06: qty 2

## 2022-01-06 MED ORDER — OXYCODONE HCL 5 MG PO TABS: ORAL_TABLET | ORAL | 0 refills | Status: AC

## 2022-01-06 MED ORDER — FENTANYL CITRATE (PF) 100 MCG/2ML IJ SOLN
INTRAMUSCULAR | Status: AC
  Filled 2022-01-06: qty 2

## 2022-01-06 MED ORDER — ACETAMINOPHEN 500 MG PO TABS
ORAL_TABLET | ORAL | Status: AC
  Filled 2022-01-06: qty 2

## 2022-01-06 MED ORDER — FENTANYL CITRATE (PF) 100 MCG/2ML IJ SOLN: 50.0000 ug | Freq: Once | INTRAMUSCULAR | Status: DC

## 2022-01-06 MED ORDER — ONDANSETRON HCL 4 MG PO TABS: 4.0000 mg | ORAL_TABLET | Freq: Three times a day (TID) | ORAL | 0 refills | Status: AC | PRN

## 2022-01-06 MED ORDER — ROCURONIUM BROMIDE 10 MG/ML (PF) SYRINGE
PREFILLED_SYRINGE | INTRAVENOUS | Status: AC
  Filled 2022-01-06: qty 10

## 2022-01-06 MED ORDER — DEXAMETHASONE SODIUM PHOSPHATE 10 MG/ML IJ SOLN
INTRAMUSCULAR | Status: AC
  Filled 2022-01-06: qty 1

## 2022-01-06 MED ORDER — ACETAMINOPHEN 500 MG PO TABS: 1000.0000 mg | ORAL_TABLET | Freq: Once | ORAL | Status: DC

## 2022-01-06 MED ORDER — MIDAZOLAM HCL 2 MG/2ML IJ SOLN: 2.0000 mg | Freq: Once | INTRAMUSCULAR | Status: DC

## 2022-01-06 MED ORDER — EPHEDRINE SULFATE (PRESSORS) 50 MG/ML IJ SOLN
INTRAMUSCULAR | Status: DC | PRN
  Administered 2022-01-06 (×2): 10 mg via INTRAVENOUS
  Administered 2022-01-06: 15 mg via INTRAVENOUS
  Administered 2022-01-06: 10 mg via INTRAVENOUS

## 2022-01-06 MED ORDER — GABAPENTIN 300 MG PO CAPS
300.0000 mg | ORAL_CAPSULE | Freq: Once | ORAL | Status: AC
  Administered 2022-01-06: 300 mg via ORAL

## 2022-01-06 MED ORDER — DEXAMETHASONE SODIUM PHOSPHATE 10 MG/ML IJ SOLN
INTRAMUSCULAR | Status: DC | PRN
  Administered 2022-01-06: 8 mg via INTRAVENOUS

## 2022-01-06 MED ORDER — LACTATED RINGERS IV SOLN: INTRAVENOUS | Status: DC

## 2022-01-06 MED ORDER — SODIUM CHLORIDE (PF) 0.9 % IJ SOLN
INTRAMUSCULAR | Status: DC | PRN
  Administered 2022-01-06: 1000 mL

## 2022-01-06 MED ORDER — 0.9 % SODIUM CHLORIDE (POUR BTL) OPTIME
TOPICAL | Status: DC | PRN
  Administered 2022-01-06: 1000 mL

## 2022-01-06 MED ORDER — ROCURONIUM BROMIDE 100 MG/10ML IV SOLN
INTRAVENOUS | Status: DC | PRN
  Administered 2022-01-06: 60 mg via INTRAVENOUS

## 2022-01-06 MED ORDER — LIDOCAINE 2% (20 MG/ML) 5 ML SYRINGE
INTRAMUSCULAR | Status: DC | PRN
  Administered 2022-01-06: 40 mg via INTRAVENOUS

## 2022-01-06 MED ORDER — SUGAMMADEX SODIUM 500 MG/5ML IV SOLN
INTRAVENOUS | Status: AC
  Filled 2022-01-06: qty 5

## 2022-01-06 MED ORDER — PROPOFOL 10 MG/ML IV BOLUS
INTRAVENOUS | Status: AC
  Filled 2022-01-06: qty 20

## 2022-01-06 MED ORDER — FENTANYL CITRATE (PF) 100 MCG/2ML IJ SOLN
INTRAMUSCULAR | Status: DC | PRN
  Administered 2022-01-06: 50 ug via INTRAVENOUS

## 2022-01-06 MED ORDER — PHENYLEPHRINE HCL (PRESSORS) 10 MG/ML IV SOLN
INTRAVENOUS | Status: AC
  Filled 2022-01-06: qty 1

## 2022-01-06 MED ORDER — LACTATED RINGERS IV BOLUS: 500.0000 mL | Freq: Once | INTRAVENOUS | Status: DC

## 2022-01-06 MED ORDER — OXYCODONE HCL 5 MG PO TABS: 5.0000 mg | ORAL_TABLET | Freq: Once | ORAL | Status: DC | PRN

## 2022-01-06 MED ORDER — PHENYLEPHRINE HCL (PRESSORS) 10 MG/ML IV SOLN
INTRAVENOUS | Status: DC | PRN
  Administered 2022-01-06: 80 ug via INTRAVENOUS

## 2022-01-06 MED ORDER — PROPOFOL 10 MG/ML IV BOLUS
INTRAVENOUS | Status: DC | PRN
  Administered 2022-01-06: 140 mg via INTRAVENOUS

## 2022-01-06 MED ORDER — CEFAZOLIN SODIUM-DEXTROSE 2-4 GM/100ML-% IV SOLN
2.0000 g | INTRAVENOUS | Status: AC
  Administered 2022-01-06: 2 g via INTRAVENOUS

## 2022-01-06 MED ORDER — FENTANYL CITRATE (PF) 100 MCG/2ML IJ SOLN: 100.0000 ug | Freq: Once | INTRAMUSCULAR | Status: DC

## 2022-01-06 MED ORDER — ONDANSETRON HCL 4 MG/2ML IJ SOLN
INTRAMUSCULAR | Status: AC
  Filled 2022-01-06: qty 2

## 2022-01-06 MED ORDER — GABAPENTIN 300 MG PO CAPS
ORAL_CAPSULE | ORAL | Status: AC
  Filled 2022-01-06: qty 1

## 2022-01-06 MED ORDER — ONDANSETRON HCL 4 MG/2ML IJ SOLN: 4.0000 mg | Freq: Once | INTRAMUSCULAR | Status: DC | PRN

## 2022-01-06 MED ORDER — VANCOMYCIN HCL 1000 MG IV SOLR
INTRAVENOUS | Status: DC | PRN
  Administered 2022-01-06: 1000 mg via TOPICAL

## 2022-01-06 MED ORDER — BUPIVACAINE HCL (PF) 0.5 % IJ SOLN
INTRAMUSCULAR | Status: DC | PRN
  Administered 2022-01-06: 15 mL via PERINEURAL

## 2022-01-06 MED ORDER — CEFAZOLIN SODIUM-DEXTROSE 2-4 GM/100ML-% IV SOLN
INTRAVENOUS | Status: AC
  Filled 2022-01-06: qty 100

## 2022-01-06 MED ORDER — LACTATED RINGERS IV BOLUS: 250.0000 mL | Freq: Once | INTRAVENOUS | Status: DC

## 2022-01-06 MED ORDER — BUPIVACAINE LIPOSOME 1.3 % IJ SUSP
INTRAMUSCULAR | Status: DC | PRN
  Administered 2022-01-06: 10 mL via PERINEURAL

## 2022-01-06 MED ORDER — TRANEXAMIC ACID-NACL 1000-0.7 MG/100ML-% IV SOLN
INTRAVENOUS | Status: AC
Start: 2022-01-06 — End: ?
  Filled 2022-01-06: qty 100

## 2022-01-06 MED ORDER — SUGAMMADEX SODIUM 500 MG/5ML IV SOLN
INTRAVENOUS | Status: DC | PRN
  Administered 2022-01-06: 500 mg via INTRAVENOUS

## 2022-01-06 MED ORDER — EPHEDRINE 5 MG/ML INJ
INTRAVENOUS | Status: AC
  Filled 2022-01-06: qty 20

## 2022-01-06 MED ORDER — ACETAMINOPHEN 500 MG PO TABS: 1000.0000 mg | ORAL_TABLET | Freq: Three times a day (TID) | ORAL | 0 refills | Status: AC

## 2022-01-06 SURGICAL SUPPLY — 75 items
AID PSTN UNV HD RSTRNT DISP (MISCELLANEOUS) ×1
APL PRP STRL LF DISP 70% ISPRP (MISCELLANEOUS) ×1
BIT DRILL 3.2 PERIPHERAL SCREW (BIT) ×1 IMPLANT
BLADE HEX COATED 2.75 (ELECTRODE) IMPLANT
BLADE SAW SGTL 73X25 THK (BLADE) ×2 IMPLANT
BLADE SURG 10 STRL SS (BLADE) IMPLANT
BLADE SURG 15 STRL LF DISP TIS (BLADE) IMPLANT
BLADE SURG 15 STRL SS (BLADE)
BNDG COHESIVE 4X5 TAN ST LF (GAUZE/BANDAGES/DRESSINGS) IMPLANT
BRUSH SCRUB EZ PLAIN DRY (MISCELLANEOUS) ×2 IMPLANT
BSPLAT GLND +3 29 (Joint) ×1 IMPLANT
CHLORAPREP W/TINT 26 (MISCELLANEOUS) ×2 IMPLANT
CLSR STERI-STRIP ANTIMIC 1/2X4 (GAUZE/BANDAGES/DRESSINGS) ×2 IMPLANT
COOLER ICEMAN CLASSIC (MISCELLANEOUS) ×1 IMPLANT
COVER BACK TABLE 60X90IN (DRAPES) ×2 IMPLANT
COVER MAYO STAND STRL (DRAPES) ×2 IMPLANT
DRAPE IMP U-DRAPE 54X76 (DRAPES) ×2 IMPLANT
DRAPE INCISE IOBAN 66X45 STRL (DRAPES) ×3 IMPLANT
DRAPE U-SHAPE 76X120 STRL (DRAPES) ×4 IMPLANT
DRSG AQUACEL AG ADV 3.5X 6 (GAUZE/BANDAGES/DRESSINGS) ×2 IMPLANT
ELECT BLADE 4.0 EZ CLEAN MEGAD (MISCELLANEOUS) ×2
ELECT REM PT RETURN 9FT ADLT (ELECTROSURGICAL) ×2
ELECTRODE BLDE 4.0 EZ CLN MEGD (MISCELLANEOUS) ×1 IMPLANT
ELECTRODE REM PT RTRN 9FT ADLT (ELECTROSURGICAL) ×1 IMPLANT
FACESHIELD WRAPAROUND (MASK) ×4 IMPLANT
FACESHIELD WRAPAROUND OR TEAM (MASK) ×2 IMPLANT
GLENOID BASEPLATE 29 +3 (Joint) ×1 IMPLANT
GLENOSPHERE PERFORM 39 3 (Shoulder) ×2 IMPLANT
GLOVE BIO SURGEON STRL SZ 6.5 (GLOVE) ×4 IMPLANT
GLOVE BIOGEL PI IND STRL 6.5 (GLOVE) ×1 IMPLANT
GLOVE BIOGEL PI IND STRL 8 (GLOVE) ×1 IMPLANT
GLOVE BIOGEL PI INDICATOR 6.5 (GLOVE) ×1
GLOVE BIOGEL PI INDICATOR 8 (GLOVE) ×1
GLOVE ECLIPSE 8.0 STRL XLNG CF (GLOVE) ×4 IMPLANT
GOWN STRL REUS W/ TWL LRG LVL3 (GOWN DISPOSABLE) ×2 IMPLANT
GOWN STRL REUS W/TWL LRG LVL3 (GOWN DISPOSABLE) ×4
GOWN STRL REUS W/TWL XL LVL3 (GOWN DISPOSABLE) ×2 IMPLANT
GUIDE PIN 3X75 SHOULDER (PIN) ×2
GUIDEWIRE GLENOID 2.5X220 (WIRE) ×1 IMPLANT
HANDPIECE INTERPULSE COAX TIP (DISPOSABLE) ×2
INSERT HUM THK+3X.75 39XSTRL (Joint) IMPLANT
INSERT REVERSED THICKNESS +3 (Joint) ×2 IMPLANT
INSRT HUM THK+3X.75 39XSTRL (Joint) ×1 IMPLANT
KIT SHOULDER STAB MARCO (KITS) ×2 IMPLANT
MANIFOLD NEPTUNE II (INSTRUMENTS) ×2 IMPLANT
PACK BASIN DAY SURGERY FS (CUSTOM PROCEDURE TRAY) ×2 IMPLANT
PACK SHOULDER (CUSTOM PROCEDURE TRAY) ×2 IMPLANT
PAD COLD SHLDR WRAP-ON (PAD) ×1 IMPLANT
PAD ORTHO SHOULDER 7X19 LRG (SOFTGOODS) ×1 IMPLANT
PENCIL SMOKE EVACUATOR (MISCELLANEOUS) ×1 IMPLANT
PIN GUIDE 3X75 SHOULDER (PIN) IMPLANT
RESTRAINT HEAD UNIVERSAL NS (MISCELLANEOUS) ×2 IMPLANT
SCREW 5.5X26 (Screw) ×1 IMPLANT
SCREW BONE 6.5X40 SM (Screw) ×1 IMPLANT
SCREW PERIPHERAL 5.0X34 (Screw) ×1 IMPLANT
SET HNDPC FAN SPRY TIP SCT (DISPOSABLE) ×1 IMPLANT
SHEET MEDIUM DRAPE 40X70 STRL (DRAPES) ×2 IMPLANT
SLEEVE SCD COMPRESS KNEE MED (STOCKING) ×2 IMPLANT
SPHERE GLENOD +3X39XLATERALIZE (Shoulder) IMPLANT
SPHR GLND +3X39XLATERALIZE (Shoulder) ×1 IMPLANT
SPIKE FLUID TRANSFER (MISCELLANEOUS) IMPLANT
SPONGE T-LAP 18X18 ~~LOC~~+RFID (SPONGE) ×2 IMPLANT
STEM HUM PERFORM 4 (Stem) ×1 IMPLANT
SUT ETHIBOND 2 V 37 (SUTURE) ×2 IMPLANT
SUT ETHIBOND NAB CT1 #1 30IN (SUTURE) ×2 IMPLANT
SUT FIBERWIRE #5 38 CONV NDL (SUTURE) ×8
SUT MNCRL AB 4-0 PS2 18 (SUTURE) ×2 IMPLANT
SUT VIC AB 0 CT1 27 (SUTURE)
SUT VIC AB 0 CT1 27XBRD ANBCTR (SUTURE) IMPLANT
SUT VIC AB 3-0 SH 27 (SUTURE) ×2
SUT VIC AB 3-0 SH 27X BRD (SUTURE) ×1 IMPLANT
SUTURE FIBERWR #5 38 CONV NDL (SUTURE) ×4 IMPLANT
TOWEL GREEN STERILE FF (TOWEL DISPOSABLE) ×6 IMPLANT
TUBE SUCTION HIGH CAP CLEAR NV (SUCTIONS) ×2 IMPLANT
WATER STERILE IRR 1000ML POUR (IV SOLUTION) ×1 IMPLANT

## 2022-01-06 NOTE — Progress Notes (Signed)
Assisted Dr. Brock with right, interscalene , ultrasound guided block. Side rails up, monitors on throughout procedure. See vital signs in flow sheet. Tolerated Procedure well. 

## 2022-01-06 NOTE — Anesthesia Procedure Notes (Signed)
Anesthesia Regional Block: Interscalene brachial plexus block   Pre-Anesthetic Checklist: , timeout performed,  Correct Patient, Correct Site, Correct Laterality,  Correct Procedure, Correct Position, site marked,  Risks and benefits discussed,  Surgical consent,  Pre-op evaluation,  At surgeon's request and post-op pain management  Laterality: Right  Prep: chloraprep       Needles:  Injection technique: Single-shot  Needle Type: Echogenic Needle     Needle Length: 5cm  Needle Gauge: 21     Additional Needles:   Narrative:  Start time: 01/06/2022 8:43 AM End time: 01/06/2022 8:46 AM Injection made incrementally with aspirations every 5 mL.  Performed by: Personally  Anesthesiologist: Audry Pili, MD  Additional Notes: No pain on injection. No increased resistance to injection. Injection made in 5cc increments. Good needle visualization. Patient tolerated the procedure well.

## 2022-01-06 NOTE — Interval H&P Note (Signed)
All questions answered, patient wants to proceed with procedure. ? ?

## 2022-01-06 NOTE — Op Note (Signed)
Orthopaedic Surgery Operative Note (CSN: [id number])  [name]  January 20, 1946 Date of Surgery: 01/06/2022   Diagnoses:  Right irreparable cuff tear and shoulder instability  Procedure: Right reverse lateralized total shoulder arthroplasty  Right open treatment of shoulder dislocation   Operative Finding Successful completion of planned procedure.  Patient's shoulder was quite unstable preoperatively.  His deltoid appeared to be somewhat atrophic compared to the contralateral side however this was not as appreciable in his preoperative examinations in clinic.  He had demonstrated intact axillary nerve function early however with his recurrent instability events he may have had some axillary nerve trauma during this.  His axillary nerve tug test was completely normal at the beginning and the end of the case and we will continue to monitor this but based on his continued preoperative instability is actually nerve function likely has some deficit and he is at higher than normal risk of continued instability.  Post-operative plan: The patient will be NWB in sling.  The patient will be will be discharged from PACU if continues to be stable as was plan prior to surgery.  DVT prophylaxis Aspirin 81 mg twice daily for 6 weeks.  Pain control with PRN pain medication preferring oral medicines.  Follow up plan will be scheduled in approximately 7 days for incision check and XR.  Physical therapy to start later this week or early next with gentle motion   Implants: Tornier size 4 stem, +3 polyethylene, 39+3 glenosphere, 29+3 baseplate with a 40 center screw and 2 peripheral locking screws.  Post-Op Diagnosis: Same Surgeons:Primary: Hiram Gash, MD Assistants:Caroline McBane PA-C Location: [place] OR ROOM 6 Anesthesia: General with Exparel Interscalene Antibiotics: Ancef 2g preop, Vancomycin '1000mg'$  locally Tourniquet time: None Estimated Blood Loss: 237 Complications: None Specimens:  None Implants: Implant Name Type Inv. Item Serial No. Manufacturer Lot No. LRB No. Used Action  GLENOID BASEPLATE 29 +3 - [id number] Joint GLENOID BASEPLATE 29 +3 [id number] [employer]  Right 1 Implanted  GLENOSPHERE PERFORM 39 3 - [id number] Shoulder GLENOSPHERE PERFORM 39 3 [id number] [employer]  Right 1 Implanted  SCREW BONE 6.5X40 SM - [id number] Screw SCREW BONE 6.5X40 SM  [employer] ON STERILE TRAY Right 1 Implanted  SCREW PERIPHERAL 5.0X34 - [id number] Screw SCREW PERIPHERAL 5.0X34  [employer] ON STERILE TRAY Right 1 Implanted  SCREW 5.5X26 - [id number] Screw SCREW 5.5X26  [employer] ON STERILE TRAY Right 1 Implanted  STEM HUM PERFORM 4 - [id number] Stem STEM HUM PERFORM 4 [id number] [employer]  Right 1 Implanted  INSERT REVERSED THICKNESS +3 - [id number] Joint INSERT REVERSED THICKNESS +3 [id number] [employer]  Right 1 Implanted    Indications for Surgery:   [name] is a 76 y.o. male with recurrent shoulder instability in setting of cuff tear that was irreparable.  Benefits and risks of operative and nonoperative management were discussed prior to surgery with patient/guardian(s) and informed consent form was completed.  Infection and need for further surgery were discussed as was prosthetic stability and cuff issues.  We additionally specifically discussed risks of axillary nerve injury, infection, periprosthetic fracture, continued pain and longevity of implants prior to beginning procedure.      Procedure:   The patient was identified in the preoperative holding area where the surgical site was marked. Block placed by anesthesia with exparel.  The patient was taken to the OR where a procedural timeout was called and the above noted anesthesia was induced.  The patient was positioned beachchair on [place]  table with spider arm positioner.  Preoperative antibiotics were dosed.  The patient's right shoulder was prepped and draped in the usual sterile fashion.  A second  preoperative timeout was called.       Standard deltopectoral approach was performed with a #10 blade. We dissected down to the subcutaneous tissues and the cephalic vein was taken laterally with the deltoid. Clavipectoral fascia was incised in line with the incision. Deep retractors were placed. The long of the biceps tendon was identified and there was significant tenosynovitis present.  Tenodesis was performed to the pectoralis tendon with #2 Ethibond. The remaining biceps was followed up into the rotator interval where it was released.  The humeral head was easily dislocated and was dislocated during our approach.  We reduced it open.  The subscapularis was taken down in a full thickness layer with capsule along the humeral neck extending inferiorly around the humeral head. We continued releasing the capsule directly off of the osteophytes inferiorly all the way around the corner. This allowed [place] to dislocate the humeral head.   The rotator cuff was carefully examined and noted to be irreperably torn.  The decision was confirmed that a reverse total shoulder was indicated for this patient.  There were osteophytes along the inferior humeral neck. The osteophytes were removed with an osteotome and a rongeur.  Osteophytes were removed with a rongeur and an osteotome and the anatomic neck was well visualized.     A humeral cutting guide was used extra medullary with a pin to help control version. The version was set at 20 of retroversion. Humeral osteotomy was performed with an oscillating saw. The head fragment was passed off the back table.  A cut protector plate was placed.  The subscapularis was again identified and immediately we took care to palpate the axillary nerve anteriorly and verify its position with gentle palpation as well as the tug test.  We then released the SGHL with bovie cautery prior to placing a curved mayo at the junction of the anterior glenoid well above the axillary nerve and  bluntly dissecting the subscapularis from the capsule.  We then carefully protected the axillary nerve as we gently released the inferior capsule to fully mobilize the subscapularis.  An anterior deltoid retractor was then placed as well as a small Hohmann retractor superiorly.  The glenoid was relatively intact in the setting of an irreparable cuff tear  The remaining labrum was removed circumferentially taking great care not to disrupt the posterior capsule.   The glenoid drill guide was placed and used to drill a guide pin in the center, inferior position. The glenoid face was then reamed concentrically over the guide wire. The center hole was drilled over the guidepin in a near anatomic angle of version. Next the glenoid vault was drilled back to a depth of 40 mm.  We tapped and then placed a 74m size baseplate with additional 375mlateralization was selected with a 6.5 mm x 40 mm length central screw.  The base plate was screwed into the glenoid vault obtaining secure fixation. We next placed superior and inferior locking screws for additional fixation.  Next a 39 +3 mm glenosphere was selected and impacted onto the baseplate. The center screw was tightened.  We turned attention back to the humeral side. The cut protector was removed.  We used the perform humeral sizing block to select the appropriate size which for this patient was a 4.  We then placed our center pin and  reamed over it concentrically obtaining appropriate inset.  We then used our lateralizing chisel to prepare the lateral aspect of the humerus.  At that point we selected the appropriate implant trialing a 4.  Using this trial implant we trialed multiple polyethylene sizes settling on a 3 millimeter which provided good stability and range of motion without excess soft tissue tension. The offset was dialed in to match the normal anatomy. The shoulder was trialed.  There was good ROM in all planes and the shoulder was stable with no  inferior translation.  The real humeral implants were opened after again confirming sizes.  The trial was removed. #5 Fiberwire x4 sutures passed through the humeral neck for subscap repair. The humeral component was press-fit obtaining a secure fit. The joint was reduced and thoroughly irrigated with pulsatile lavage. Subscap was repaired back with #5 Fiberwire sutures through bone tunnels. Hemostasis was obtained. The deltopectoral interval was reapproximated with #1 Ethibond. The subcutaneous tissues were closed with 2-0 Vicryl and the skin was closed with running monocryl.    The wounds were cleaned and dried and an Aquacel dressing was placed. The drapes taken down. The arm was placed into sling with abduction pillow. Patient was awakened, extubated, and transferred to the recovery room in stable condition. There were no intraoperative complications. The sponge, needle, and attention counts were  correct at the end of the case.     Noemi Chapel, PA-C, present and scrubbed throughout the case, critical for completion in a timely fashion, and for retraction, instrumentation, closure.

## 2022-01-06 NOTE — Anesthesia Preprocedure Evaluation (Addendum)
Anesthesia Evaluation  Patient identified by MRN, date of birth, ID band Patient awake    Reviewed: Allergy & Precautions, NPO status , Patient's Chart, lab work & pertinent test results  History of Anesthesia Complications Negative for: history of anesthetic complications  Airway Mallampati: III  TM Distance: >3 FB Neck ROM: Full    Dental  (+) Dental Advisory Given   Pulmonary neg pulmonary ROS,    Pulmonary exam normal        Cardiovascular hypertension, Pt. on medications + dysrhythmias Atrial Fibrillation  Rhythm:Irregular Rate:Bradycardia     Neuro/Psych PSYCHIATRIC DISORDERS Anxiety negative neurological ROS     GI/Hepatic Neg liver ROS, GERD  Controlled,  Endo/Other   Obesity Pre-DM   Renal/GU negative Renal ROS     Musculoskeletal  (+) Arthritis ,   Abdominal   Peds  Hematology  On eliquis    Anesthesia Other Findings   Reproductive/Obstetrics                           Anesthesia Physical Anesthesia Plan  ASA: 3  Anesthesia Plan: General   Post-op Pain Management: Tylenol PO (pre-op)* and Regional block*   Induction: Intravenous  PONV Risk Score and Plan: 2 and Treatment may vary due to age or medical condition, Ondansetron and Dexamethasone  Airway Management Planned: Oral ETT  Additional Equipment: None  Intra-op Plan:   Post-operative Plan: Extubation in OR  Informed Consent: I have reviewed the patients History and Physical, chart, labs and discussed the procedure including the risks, benefits and alternatives for the proposed anesthesia with the patient or authorized representative who has indicated his/her understanding and acceptance.     Dental advisory given  Plan Discussed with: CRNA and Anesthesiologist  Anesthesia Plan Comments:        Anesthesia Quick Evaluation

## 2022-01-06 NOTE — Anesthesia Postprocedure Evaluation (Signed)
Anesthesia Post Note  Patient: [name]  Procedure(s) Performed: REVERSE SHOULDER ARTHROPLASTY (Right: Shoulder)     Patient location during evaluation: PACU Anesthesia Type: General Level of consciousness: awake and alert Pain management: pain level controlled Vital Signs Assessment: post-procedure vital signs reviewed and stable Respiratory status: spontaneous breathing, nonlabored ventilation and respiratory function stable Cardiovascular status: stable and blood pressure returned to baseline Anesthetic complications: no   No notable events documented.  Last Vitals:  Vitals:   01/06/22 1130 01/06/22 1145  BP: 136/82   Pulse: 73 74  Resp: 15 19  Temp:    SpO2: 98% 93%    Last Pain:  Vitals:   01/06/22 1130  TempSrc:   PainSc: [place]

## 2022-01-06 NOTE — Transfer of Care (Signed)
Immediate Anesthesia Transfer of Care Note  Patient: [name]  Procedure(s) Performed: REVERSE SHOULDER ARTHROPLASTY (Right: Shoulder)  Patient Location: PACU  Anesthesia Type:GA combined with regional for post-op pain  Level of Consciousness: drowsy  Airway & Oxygen Therapy: Patient Spontanous Breathing and Patient connected to face mask oxygen  Post-op Assessment: Report given to RN and Post -op Vital signs reviewed and stable  Post vital signs: Reviewed and stable  Last Vitals:  Vitals Value Taken Time  BP 120/99 01/06/22 1108  Temp    Pulse 74 01/06/22 1111  Resp 18 01/06/22 1111  SpO2 98 % 01/06/22 1111  Vitals shown include unvalidated device data.  Last Pain:  Vitals:   01/06/22 0754  TempSrc: Oral  PainSc: 0-No pain      Patients Stated Pain Goal: 3 (07/86/75 4492)  Complications: No notable events documented.

## 2022-01-06 NOTE — Anesthesia Procedure Notes (Signed)
Procedure Name: Intubation Date/Time: 01/06/2022 9:39 AM  Performed by: Lavonia Dana, CRNAPre-anesthesia Checklist: Patient identified, Emergency Drugs available, Suction available and Patient being monitored Patient Re-evaluated:Patient Re-evaluated prior to induction Oxygen Delivery Method: Circle system utilized Preoxygenation: Pre-oxygenation with 100% oxygen Induction Type: IV induction Ventilation: Mask ventilation without difficulty Laryngoscope Size: Mac and 4 Grade View: Grade I Tube type: Oral Tube size: 7.5 mm Number of attempts: 1 Airway Equipment and Method: Stylet and Oral airway Placement Confirmation: ETT inserted through vocal cords under direct vision, positive ETCO2 and breath sounds checked- equal and bilateral Secured at: 23 cm Tube secured with: Tape Dental Injury: Teeth and Oropharynx as per pre-operative assessment

## 2022-01-07 ENCOUNTER — Encounter (HOSPITAL_BASED_OUTPATIENT_CLINIC_OR_DEPARTMENT_OTHER): Payer: Self-pay | Admitting: Orthopaedic Surgery

## 2022-01-12 DIAGNOSIS — Z96611 Presence of right artificial shoulder joint: Secondary | ICD-10-CM | POA: Diagnosis not present

## 2022-01-12 DIAGNOSIS — M6281 Muscle weakness (generalized): Secondary | ICD-10-CM | POA: Diagnosis not present

## 2022-01-13 ENCOUNTER — Ambulatory Visit: Payer: Medicare Other | Admitting: Internal Medicine

## 2022-01-13 DIAGNOSIS — M19011 Primary osteoarthritis, right shoulder: Secondary | ICD-10-CM | POA: Diagnosis not present

## 2022-01-27 DIAGNOSIS — M25511 Pain in right shoulder: Secondary | ICD-10-CM | POA: Diagnosis not present

## 2022-01-31 DIAGNOSIS — M25511 Pain in right shoulder: Secondary | ICD-10-CM | POA: Diagnosis not present

## 2022-02-03 DIAGNOSIS — M25511 Pain in right shoulder: Secondary | ICD-10-CM | POA: Diagnosis not present

## 2022-02-07 DIAGNOSIS — M25511 Pain in right shoulder: Secondary | ICD-10-CM | POA: Diagnosis not present

## 2022-02-07 DIAGNOSIS — H00014 Hordeolum externum left upper eyelid: Secondary | ICD-10-CM | POA: Diagnosis not present

## 2022-02-09 DIAGNOSIS — M25511 Pain in right shoulder: Secondary | ICD-10-CM | POA: Diagnosis not present

## 2022-02-14 DIAGNOSIS — M25511 Pain in right shoulder: Secondary | ICD-10-CM | POA: Diagnosis not present

## 2022-02-24 DIAGNOSIS — M19011 Primary osteoarthritis, right shoulder: Secondary | ICD-10-CM | POA: Diagnosis not present

## 2022-02-28 DIAGNOSIS — Z96611 Presence of right artificial shoulder joint: Secondary | ICD-10-CM | POA: Diagnosis not present

## 2022-02-28 DIAGNOSIS — M6281 Muscle weakness (generalized): Secondary | ICD-10-CM | POA: Diagnosis not present

## 2022-03-03 ENCOUNTER — Encounter: Payer: Medicare Other | Admitting: Physician Assistant

## 2022-03-03 DIAGNOSIS — Z96611 Presence of right artificial shoulder joint: Secondary | ICD-10-CM | POA: Diagnosis not present

## 2022-03-03 DIAGNOSIS — M6281 Muscle weakness (generalized): Secondary | ICD-10-CM | POA: Diagnosis not present

## 2022-03-16 DIAGNOSIS — M25511 Pain in right shoulder: Secondary | ICD-10-CM | POA: Diagnosis not present

## 2022-03-23 DIAGNOSIS — M25511 Pain in right shoulder: Secondary | ICD-10-CM | POA: Diagnosis not present

## 2022-03-24 ENCOUNTER — Telehealth: Payer: Self-pay

## 2022-03-24 NOTE — Patient Instructions (Signed)
Visit Information  Thank you for taking time to visit with me today. Please don't hesitate to contact me if I can be of assistance to you.   Following are the goals we discussed today:   Goals Addressed             This Visit's Progress    COMPLETED: Care Coordination Activities-No follow up required       Care Coordination Interventions: Advised patient to [facility] services. Patient declined.           If you are experiencing a Mental Health or [place] or need someone to talk to, please call the Suicide and Crisis Lifeline: 988   The patient verbalized understanding of instructions, educational materials, and care plan provided today and DECLINED offer to receive copy of patient instructions, educational materials, and care plan.   No further follow up required: patient refused   Jone Baseman, RN, MSN [place] Care Management Coordinator Direct Line [phone]

## 2022-03-24 NOTE — Patient Outreach (Signed)
  Care Coordination   Initial Visit Note   03/24/2022 Name: [name] MRN: [id number] DOB: 02/01/1946  [name] is a 76 y.o. year old male who sees Johnathan Pinto, MD for primary care. I spoke with  [name] by phone today.  What matters to the patients health and wellness today?  Patient refused    Goals Addressed             This Visit's Progress    COMPLETED: Care Coordination Activities-No follow up required       Care Coordination Interventions: Advised patient to [facility] services. Patient declined.          SDOH assessments and interventions completed:  No     Care Coordination Interventions Activated:  Yes  Care Coordination Interventions:  Yes, provided   Follow up plan: No further intervention required.   Encounter Outcome:  Pt. Refused   Johnathan Baseman, RN, MSN [place] [facility] Care Management Coordinator Direct Line [phone]

## 2022-03-31 ENCOUNTER — Ambulatory Visit: Payer: Medicare Other | Admitting: Nurse Practitioner

## 2022-04-01 ENCOUNTER — Encounter: Payer: Medicare Other | Admitting: Physician Assistant

## 2022-04-06 DIAGNOSIS — M25511 Pain in right shoulder: Secondary | ICD-10-CM | POA: Diagnosis not present

## 2022-04-12 DIAGNOSIS — M25511 Pain in right shoulder: Secondary | ICD-10-CM | POA: Diagnosis not present

## 2022-04-13 ENCOUNTER — Ambulatory Visit: Payer: Medicare Other | Admitting: Nurse Practitioner

## 2022-04-18 DIAGNOSIS — H10503 Unspecified blepharoconjunctivitis, bilateral: Secondary | ICD-10-CM | POA: Diagnosis not present

## 2022-04-18 DIAGNOSIS — H02834 Dermatochalasis of left upper eyelid: Secondary | ICD-10-CM | POA: Diagnosis not present

## 2022-04-18 DIAGNOSIS — H02831 Dermatochalasis of right upper eyelid: Secondary | ICD-10-CM | POA: Diagnosis not present

## 2022-04-19 ENCOUNTER — Other Ambulatory Visit: Payer: Self-pay | Admitting: Internal Medicine

## 2022-04-19 ENCOUNTER — Encounter: Payer: Self-pay | Admitting: Nurse Practitioner

## 2022-04-19 ENCOUNTER — Ambulatory Visit (INDEPENDENT_AMBULATORY_CARE_PROVIDER_SITE_OTHER): Payer: Medicare Other | Admitting: Nurse Practitioner

## 2022-04-19 VITALS — BP 138/92 | HR 69 | Temp 97.3°F | Ht 72.0 in | Wt 236.0 lb

## 2022-04-19 DIAGNOSIS — E559 Vitamin D deficiency, unspecified: Secondary | ICD-10-CM | POA: Diagnosis not present

## 2022-04-19 DIAGNOSIS — Z96611 Presence of right artificial shoulder joint: Secondary | ICD-10-CM | POA: Diagnosis not present

## 2022-04-19 DIAGNOSIS — E669 Obesity, unspecified: Secondary | ICD-10-CM

## 2022-04-19 DIAGNOSIS — I480 Paroxysmal atrial fibrillation: Secondary | ICD-10-CM | POA: Diagnosis not present

## 2022-04-19 DIAGNOSIS — K589 Irritable bowel syndrome without diarrhea: Secondary | ICD-10-CM

## 2022-04-19 DIAGNOSIS — G4700 Insomnia, unspecified: Secondary | ICD-10-CM | POA: Diagnosis not present

## 2022-04-19 DIAGNOSIS — I700 Atherosclerosis of aorta: Secondary | ICD-10-CM

## 2022-04-19 DIAGNOSIS — Z79899 Other long term (current) drug therapy: Secondary | ICD-10-CM | POA: Diagnosis not present

## 2022-04-19 DIAGNOSIS — Z85828 Personal history of other malignant neoplasm of skin: Secondary | ICD-10-CM | POA: Diagnosis not present

## 2022-04-19 DIAGNOSIS — Z23 Encounter for immunization: Secondary | ICD-10-CM | POA: Diagnosis not present

## 2022-04-19 DIAGNOSIS — N401 Enlarged prostate with lower urinary tract symptoms: Secondary | ICD-10-CM

## 2022-04-19 DIAGNOSIS — I10 Essential (primary) hypertension: Secondary | ICD-10-CM | POA: Diagnosis not present

## 2022-04-19 DIAGNOSIS — E782 Mixed hyperlipidemia: Secondary | ICD-10-CM | POA: Diagnosis not present

## 2022-04-19 DIAGNOSIS — N138 Other obstructive and reflux uropathy: Secondary | ICD-10-CM

## 2022-04-19 DIAGNOSIS — F419 Anxiety disorder, unspecified: Secondary | ICD-10-CM

## 2022-04-19 DIAGNOSIS — R7309 Other abnormal glucose: Secondary | ICD-10-CM

## 2022-04-19 DIAGNOSIS — M6281 Muscle weakness (generalized): Secondary | ICD-10-CM | POA: Diagnosis not present

## 2022-04-19 MED ORDER — ESCITALOPRAM OXALATE 20 MG PO TABS: ORAL_TABLET | ORAL | 3 refills | Status: AC

## 2022-04-19 NOTE — Telephone Encounter (Signed)
Prescription refill request for Eliquis received. Indication:Afib Last office visit:6/23 Scr:0.8 Age: 76 Weight:105.2 kg  Prescription refilled

## 2022-04-19 NOTE — Progress Notes (Signed)
FOLLOW UP  Assessment:   Essential hypertension Okay to take Losartan BID if BP sustaining >160/90 Continue to follow with a-fib clinic. Continue f/u apt at end of month. Discussed DASH (Dietary Approaches to Stop Hypertension) DASH diet is lower in sodium than a typical American diet. Cut back on foods that are high in saturated fat, cholesterol, and trans fats. Eat more whole-grain foods, fish, poultry, and nuts Remain active and exercise as tolerated daily.  Monitor BP at home-Call if greater than 130/80.  Check CMP/CBC   Paroxysmal atrial fibrillation (HCC) Continue elequis 48m BID Continue to follow with Dr. ARayann Heman Mixed hyperlipidemia Discussed lifestyle modifications. Recommended diet heavy in fruits and veggies, omega 3's. Decrease consumption of animal meats, cheeses, and dairy products. Remain active and exercise as tolerated. Continue to monitor. Check lipids/TSH    Anxiety He has discontinued Xanax Continue Lexapro Continue to monitor Controlled   Insomnia He is off Xanax and is currently on Hydroxyzine 50 mg 1/2 tab QHS Controlled. Continue to monitor  Aortic atherosclerosis ([place] 10/11/17 Control blood pressure, cholesterol, glucose, increase exercise. Monitor lipids  Other abnormal glucose Education: Reviewed 'ABCs' of diabetes management  Discussed goals to be met and/or maintained include A1C (<7) Blood pressure (<130/80) Cholesterol (LDL <70) Continue Eye Exam yearly  Continue Dental Exam Q6 mo Discussed dietary recommendations Discussed Physical Activity recommendations Check A1C   Vitamin D deficiency Continue supplement Monitor levels  Benign prostatic hyperplasia with urinary frequency Continue follow up urology - Dr. MTresa MooreMonitor BPH  Irritable bowel syndrome, unspecified type Diet discussed, including probiotic. Continue to monitor  Obesity BMI Discussed appropriate BMI Diet modification. Physical  activity. Encouraged/praised to build confidence.   History of SCC (squamous cell carcinoma) of skin Follow up DERM   Medication management All medications discussed and reviewed in full. All questions and concerns regarding medications addressed.    Flu vaccine need Administered  Patient tolerated well  Orders Placed This Encounter  Procedures   Flu vaccine HIGH DOSE PF (Fluzone High dose)   CBC with Differential/Platelet   COMPLETE METABOLIC PANEL WITH GFR   Lipid panel   Hemoglobin A1c   VITAMIN D 25 Hydroxy (Vit-D Deficiency, Fractures)     Further disposition pending results if labs check today. Discussed med's effects and SE's.    Over 20 minutes of face to face interview, exam, counseling, chart review, and critical decision making was performed.    Future Appointments  Date Time Provider DHenrico 05/02/2022 11:00 AM [facility]Annamaria [facility]  08/02/2022 11:00 AM MUnk Pinto MD [facility] None  10/12/2022 11:00 AM CDarrol Jump NP [facility] None     Subjective:  Johnathan GRANVILLEis a 76y.o. male who presents for  3 month follow up for HTN, HLD, and vitamin D Def.   He recently had right reverse shoulder arthroplasty.  Doing well.  Currently in PT,  has 3-4 weeks left.  Will follow with Orthopedics next week. Pain is well managed.   He is not staying well hydrated.  Drinking 2 bottles of water and 2 sodas a day.  Has been under stress d/t his wife admitted to hospital for septic shock from UTI.    He has history of Afib He is on tambocor, verapamil he did go back on the Eliquis due to his vertigo and headaches. Reports elevated BP, systolic >>308  Has reached out to a-fib clinic who has informed him to take Losartan qd, he is having  intermittent readings of systolic staying elevated.  He has an apt scheduled end of Oct with a-fib clinic.   Follows with Dr. Tresa Moore for BPH. No new or recent changes.  Left knee is  doing well after surgery. He is doing stationary bike for exercise.   BMI is Body mass index is 32.01 kg/m., he has not been working on diet.  Wt Readings from Last 3 Encounters:  04/19/22 236 lb (107 kg)  01/06/22 231 lb 14.8 oz (105.2 kg)  12/23/21 232 lb 6.4 oz (105.4 kg)   His blood pressure has been controlled at home, today their BP is BP: (!) 138/92  BP Readings from Last 3 Encounters:  04/19/22 (!) 138/92  01/06/22 130/79  12/23/21 (!) 142/94  He does workout. He denies chest pain, shortness of breath, dizziness.    He is on cholesterol medication, on rosuvastatin 20 mg daily, has increased this and denies myalgias. His cholesterol is at goal. The cholesterol last visit was:   Lab Results  Component Value Date   CHOL 177 10/13/2021   HDL 51 10/13/2021   LDLCALC 103 (H) 10/13/2021   TRIG 130 10/13/2021   CHOLHDL 3.5 10/13/2021    Had elevated A1C in 2013, has done well with diet/exercise. Last A1C in the office was:  Lab Results  Component Value Date   HGBA1C 5.3 07/28/2021   Patient is on Vitamin D supplement.   Lab Results  Component Value Date   VD25OH 82 07/28/2021      Medication Review:   Current Outpatient Medications (Cardiovascular):    diltiazem (CARDIZEM) 30 MG tablet, Take 1 tablet every 4 hours AS NEEDED for AFIB heart rate >100 as long as blood pressure >100.   flecainide (TAMBOCOR) 100 MG tablet, TAKE 1 TABLET BY MOUTH  TWICE DAILY   losartan (COZAAR) 50 MG tablet, TAKE 1 TABLET BY MOUTH  DAILY FOR BLOOD PRESSURE   rosuvastatin (CRESTOR) 20 MG tablet, Take  1/2 tablet (10 mg) daily  for Cholesterol   verapamil (CALAN-SR) 120 MG CR tablet, Take  1 tablet  Daily  with Food  for BP & Heart Rhythm    Current Outpatient Medications (Hematological):    ELIQUIS 5 MG TABS tablet, TAKE 1 TABLET BY MOUTH TWICE  DAILY  Current Outpatient Medications (Other):    Cholecalciferol (VITAMIN D3) 5000 units CAPS, Take 10,000 Units by mouth daily.     finasteride (PROSCAR) 5 MG tablet, Take 5 mg by mouth daily.   Flaxseed, Linseed, (FLAX SEED OIL PO), Take 1 capsule by mouth daily.   hydrOXYzine (ATARAX) 50 MG tablet, TAKE 1 TO 2 TABLETS 1 TO 2 HOURS BEFORE BEDTIME IF NEEDED FOR SLEEP   Multiple Vitamin (MULTIVITAMIN) tablet, Take 1 tablet by mouth daily.   Omega-3 Fatty Acids (FISH OIL PO), Take 1 capsule by mouth 2 (two) times daily.   vitamin C (ASCORBIC ACID) 500 MG tablet, Take 500 mg by mouth daily.   zinc gluconate 50 MG tablet, Take 50 mg by mouth daily.   escitalopram (LEXAPRO) 20 MG tablet, Take 1/2 tablet (10 mg) daily.  Current Problems (verified) Patient Active Problem List   Diagnosis Date Noted   BPH (benign prostatic hyperplasia) 05/22/2019   Insomnia 08/09/2018   HNP (herniated nucleus pulposus), cervical 07/16/2018   Aortic atherosclerosis ([place]) by Chest CT scan on 10/11/2017 10/11/2017   Arthritis of carpometacarpal [facility]) joint of left thumb 07/27/2017   Chronic pain of left thumb 07/27/2017   History of SCC (squamous  cell carcinoma) of skin 10/04/2016   Atrial fibrillation ([place]) 08/01/2016   Obesity (BMI 30.0-34.9) 09/30/2013   Essential hypertension    Hyperlipidemia, mixed    BPH with obstruction/lower urinary tract symptoms    Abnormal glucose    IBS (irritable bowel syndrome)    Vitamin D deficiency     Screening Tests Immunization History  Administered Date(s) Administered   DTaP 07/11/2005   Influenza Whole 04/02/2013   Influenza, High Dose Seasonal PF 05/01/2014, 05/21/2015, 03/29/2016, 05/31/2017, 05/05/2018, 05/01/2019, 04/19/2021, 04/19/2022   Moderna Sars-Covid-2 Vaccination 08/10/2019, 09/09/2019   Pneumococcal Conjugate-13 05/01/2014   Pneumococcal Polysaccharide-23 09/08/2008, 03/29/2016   Td 12/16/2015   Zoster, Live 12/09/2008   Health Maintenance  Topic Date Due   Zoster Vaccines- Shingrix (1 of 2) Never done   COVID-19 Vaccine (3 - Moderna risk series) 10/07/2019   TETANUS/TDAP   12/15/2025   Pneumonia Vaccine 45+ Years old  Completed   INFLUENZA VACCINE  Completed   Hepatitis C Screening  Completed   HPV VACCINES  Aged Out   Fecal DNA (Cologuard)  Discontinued    Names of Other Physician/Practitioners you currently use: 1. [facility] here for primary care 2. Dr. Satira Sark, eye doctor, visit yearly 08/2021 3. Dr. Jolayne Panther, dentist, q 6 months 2022  Patient Care Team: Unk Pinto, MD as PCP - General (Internal Medicine) Thompson Grayer, MD as PCP - Cardiology (Cardiology) Charlotte Crumb, MD as Consulting Physician (Orthopedic Surgery) Inda Castle, MD (Inactive) as Consulting Physician (Gastroenterology) Alexis Frock, MD as Consulting Physician (Urology) Thompson Grayer, MD as Consulting Physician (Cardiology)   History reviewed: allergies, current medications, past family history, past medical history, past social history, past surgical history and problem list  Allergies Allergies  Allergen Reactions   Xarelto [Rivaroxaban] Other (See Comments)    Severe body aches   Atorvastatin Other (See Comments)    Myalgias   Clonazepam Other (See Comments)    "Made me jittery"   Tamsulosin Hcl Other (See Comments)    Hypotension    SURGICAL HISTORY He  has a past surgical history that includes Appendectomy (02-07-2007); Anterior cervical decomp/discectomy fusion (01-25-2002); LEFT INGUINAL HERNIA REPAIR (1970); Umbilical hernia repair (2003); LEFT ROTATOR CUFF REPAIR (2002); Anterior cruciate ligament repair (1990); Cervical fusion (2000); LEFT ANKLE SURG. (1980); Hip surgery (1999); Colonoscopy (05-30-2011); Tonsillectomy; Trigger finger release (Right, 07/17/2013); Tendon reconstruction (Left, 07/17/2013); LOOP RECORDER INSERTION (N/A, 09/19/2016); Hernia repair; Anterior cervical decomp/discectomy fusion (Bilateral, 07/16/2018); Carpal tunnel release (Right, 07/16/2018); XI robotic assisted simple prostatectomy (N/A,  05/22/2019); Cataract extraction (Right, 01/2020); and Reverse shoulder arthroplasty (Right, 01/06/2022). FAMILY HISTORY His family history includes Alcohol abuse in his father; Diabetes in his father; Heart attack (age of onset: 69) in his father; Heart disease in his father and mother; Stroke (age of onset: 67) in his mother. SOCIAL HISTORY He  reports that he has never smoked. He has never used smokeless tobacco. He reports current alcohol use of about 4.0 standard drinks of alcohol per week. He reports that he does not use drugs.  Objective:   Blood pressure (!) 138/92, pulse 69, temperature (!) 97.3 F (36.3 C), height 6' (1.829 m), weight 236 lb (107 kg), SpO2 96 %. Body mass index is 32.01 kg/m.  General appearance: alert, no distress, WD/WN, male HEENT: normocephalic, sclerae anicteric, TMs pearly, nares patent, no discharge or erythema, pharynx normal Oral cavity: MMM, no lesions Neck: supple, no lymphadenopathy, no thyromegaly, no masses Heart: RRR, normal S1, S2, no murmurs  Lungs: CTA bilaterally, no wheezes, rhonchi, or rales Abdomen: +bs, soft, non tender, non distended, no masses, no hepatomegaly, no splenomegaly Musculoskeletal: nontender, right knee with obvious bony enlargement no effusion, no erythema. Extremities: no edema, no cyanosis, no clubbing Pulses: 2+ symmetric, upper and lower extremities, normal cap refill Neurological: alert, oriented x 3, CN2-12 intact, strength normal upper extremities and lower extremities, sensation normal throughout, DTRs 2+ throughout, no cerebellar signs, gait antalgic Skin: left anterior shin with 3 cm erythematous, warm nodule with punctate on it.  Psychiatric: normal affect, behavior normal, pleasant    Darrol Jump, NP   04/19/2022

## 2022-04-20 LAB — HEMOGLOBIN A1C
Hgb A1c MFr Bld: 5.5 % of total Hgb (ref <5.7)
Mean Plasma Glucose: 111 mg/dL
eAG (mmol/L): 6.2 mmol/L

## 2022-04-20 LAB — CBC WITH DIFFERENTIAL/PLATELET
Absolute Monocytes: 712 cells/uL (ref 200–950)
Basophils Absolute: 72 cells/uL (ref 0–200)
Basophils Relative: 0.9 %
Eosinophils Absolute: 216 cells/uL (ref 15–500)
Eosinophils Relative: 2.7 %
HCT: 49.4 % (ref 38.5–50.0)
Hemoglobin: 16.8 g/dL (ref 13.2–17.1)
Lymphs Abs: 1408 cells/uL (ref 850–3900)
MCH: 31.5 pg (ref 27.0–33.0)
MCHC: 34 g/dL (ref 32.0–36.0)
MCV: 92.5 fL (ref 80.0–100.0)
MPV: 10.5 fL (ref 7.5–12.5)
Monocytes Relative: 8.9 %
Neutro Abs: 5592 cells/uL (ref 1500–7800)
Neutrophils Relative %: 69.9 %
Platelets: 254 10*3/uL (ref 140–400)
RBC: 5.34 10*6/uL (ref 4.20–5.80)
RDW: 14.4 % (ref 11.0–15.0)
Total Lymphocyte: 17.6 %
WBC: 8 10*3/uL (ref 3.8–10.8)

## 2022-04-20 LAB — COMPLETE METABOLIC PANEL WITH GFR
AG Ratio: 1.8 (calc) (ref 1.0–2.5)
ALT: 26 U/L (ref 9–46)
AST: 20 U/L (ref 10–35)
Albumin: 4.4 g/dL (ref 3.6–5.1)
Alkaline phosphatase (APISO): 94 U/L (ref 35–144)
BUN: 15 mg/dL (ref 7–25)
CO2: 26 mmol/L (ref 20–32)
Calcium: 10 mg/dL (ref 8.6–10.3)
Chloride: 105 mmol/L (ref 98–110)
Creat: 0.73 mg/dL (ref 0.70–1.28)
Globulin: 2.5 g/dL (calc) (ref 1.9–3.7)
Glucose, Bld: 84 mg/dL (ref 65–99)
Potassium: 4.2 mmol/L (ref 3.5–5.3)
Sodium: 140 mmol/L (ref 135–146)
Total Bilirubin: 0.5 mg/dL (ref 0.2–1.2)
Total Protein: 6.9 g/dL (ref 6.1–8.1)
eGFR: 94 mL/min/{1.73_m2} (ref >=60)

## 2022-04-20 LAB — LIPID PANEL
Cholesterol: 161 mg/dL (ref <200)
HDL: 46 mg/dL (ref >=40)
LDL Cholesterol (Calc): 92 mg/dL (calc)
Non-HDL Cholesterol (Calc): 115 mg/dL (calc) (ref <130)
Total CHOL/HDL Ratio: 3.5 (calc) (ref <5.0)
Triglycerides: 135 mg/dL (ref <150)

## 2022-04-20 LAB — VITAMIN D 25 HYDROXY (VIT D DEFICIENCY, FRACTURES): Vit D, 25-Hydroxy: 47 ng/mL (ref 30–100)

## 2022-04-21 ENCOUNTER — Other Ambulatory Visit: Payer: Self-pay | Admitting: Internal Medicine

## 2022-04-21 ENCOUNTER — Telehealth: Payer: Self-pay | Admitting: Nurse Practitioner

## 2022-04-21 ENCOUNTER — Encounter: Payer: Self-pay | Admitting: Internal Medicine

## 2022-04-21 MED ORDER — VALSARTAN 320 MG PO TABS: ORAL_TABLET | ORAL | 3 refills | Status: DC

## 2022-04-21 MED ORDER — OLMESARTAN MEDOXOMIL 40 MG PO TABS: ORAL_TABLET | ORAL | 3 refills | Status: DC

## 2022-04-21 NOTE — Telephone Encounter (Signed)
Pt is concerned about his HR for about a week, A-fib lab recommended an extra losartan. Said he saw [place] yesterday and she di not recommend that but to just stay hydrated. He thinks he has anxiety but still concerned.

## 2022-04-22 NOTE — Telephone Encounter (Signed)
Left message on voice mail  to call back

## 2022-04-23 ENCOUNTER — Encounter (HOSPITAL_COMMUNITY): Payer: Self-pay

## 2022-04-23 ENCOUNTER — Emergency Department (HOSPITAL_COMMUNITY)
Admission: EM | Admit: 2022-04-23 | Discharge: 2022-04-24 | Disposition: A | Discharge status: Home / Self Care | Payer: Medicare Other | Attending: Emergency Medicine | Admitting: Emergency Medicine

## 2022-04-23 DIAGNOSIS — Z7901 Long term (current) use of anticoagulants: Secondary | ICD-10-CM | POA: Diagnosis not present

## 2022-04-23 DIAGNOSIS — Z79899 Other long term (current) drug therapy: Secondary | ICD-10-CM | POA: Diagnosis not present

## 2022-04-23 DIAGNOSIS — R079 Chest pain, unspecified: Secondary | ICD-10-CM | POA: Diagnosis present

## 2022-04-23 DIAGNOSIS — I10 Essential (primary) hypertension: Secondary | ICD-10-CM | POA: Diagnosis not present

## 2022-04-23 LAB — BASIC METABOLIC PANEL
Anion gap: 9 (ref 5–15)
BUN: 9 mg/dL (ref 8–23)
CO2: 24 mmol/L (ref 22–32)
Calcium: 9.3 mg/dL (ref 8.9–10.3)
Chloride: 109 mmol/L (ref 98–111)
Creatinine, Ser: 0.78 mg/dL (ref 0.61–1.24)
GFR, Estimated: >60 mL/min (ref >60)
Glucose, Bld: 96 mg/dL (ref 70–99)
Potassium: 4.2 mmol/L (ref 3.5–5.1)
Sodium: 142 mmol/L (ref 135–145)

## 2022-04-23 LAB — CBC WITH DIFFERENTIAL/PLATELET
Abs Immature Granulocytes: 0.03 10*3/uL (ref 0.00–0.07)
Basophils Absolute: 0.1 10*3/uL (ref 0.0–0.1)
Basophils Relative: 1 %
Eosinophils Absolute: 0.2 10*3/uL (ref 0.0–0.5)
Eosinophils Relative: 2 %
HCT: 49.8 % (ref 39.0–52.0)
Hemoglobin: 16.5 g/dL (ref 13.0–17.0)
Immature Granulocytes: 0 %
Lymphocytes Relative: 20 %
Lymphs Abs: 1.7 10*3/uL (ref 0.7–4.0)
MCH: 31.4 pg (ref 26.0–34.0)
MCHC: 33.1 g/dL (ref 30.0–36.0)
MCV: 94.9 fL (ref 80.0–100.0)
Monocytes Absolute: 0.6 10*3/uL (ref 0.1–1.0)
Monocytes Relative: 7 %
Neutro Abs: 5.7 10*3/uL (ref 1.7–7.7)
Neutrophils Relative %: 70 %
Platelets: 248 10*3/uL (ref 150–400)
RBC: 5.25 MIL/uL (ref 4.22–5.81)
RDW: 14.3 % (ref 11.5–15.5)
WBC: 8.3 10*3/uL (ref 4.0–10.5)
nRBC: 0 % (ref 0.0–0.2)

## 2022-04-23 LAB — TROPONIN I (HIGH SENSITIVITY): Troponin I (High Sensitivity): 8 ng/L (ref <18)

## 2022-04-23 NOTE — ED Triage Notes (Signed)
Pt states that his BP is high and he is having CP as well, denies other cardiac symptoms.

## 2022-04-23 NOTE — ED Provider Triage Note (Signed)
Emergency Medicine Provider Triage Evaluation Note  ZANDON TALTON , a 76 y.o. male  was evaluated in triage.  Pt complains of blood pressure over the last week.  PCP is aware and increase losartan.  Said that today he got a reading of 190s over 104.  Had some chest pain as well.  Nonradiating  Review of Systems  Positive: Elevated blood pressure, chest discomfort Negative: Headache or blurred vision  Physical Exam  BP (!) 150/97   Pulse 83   Temp 98.2 F (36.8 C) (Oral)   Resp 20   SpO2 97%  Gen:   Awake, no distress   Resp:  Normal effort  MSK:   Moves extremities without difficulty  Other:  Well-appearing, not diaphoretic.  Blood pressure 150s over 90s.  Medical Decision Making  Medically screening exam initiated at 10:17 PM.  Appropriate orders placed.  KALDEN WANKE was informed that the remainder of the evaluation will be completed by another provider, this initial triage assessment does not replace that evaluation, and the importance of remaining in the ED until their evaluation is complete.   Will rule out ACS   Rhae Hammock, PA-C 04/23/22 2218

## 2022-04-24 DIAGNOSIS — I10 Essential (primary) hypertension: Secondary | ICD-10-CM | POA: Diagnosis not present

## 2022-04-24 LAB — TROPONIN I (HIGH SENSITIVITY): Troponin I (High Sensitivity): 8 ng/L (ref <18)

## 2022-04-24 MED ORDER — NITROGLYCERIN 2 % TD OINT
1.0000 [in_us] | TOPICAL_OINTMENT | Freq: Once | TRANSDERMAL | Status: AC
  Administered 2022-04-24: 1 [in_us] via TOPICAL
  Filled 2022-04-24: qty 1

## 2022-04-24 MED ORDER — APIXABAN 5 MG PO TABS
5.0000 mg | ORAL_TABLET | Freq: Two times a day (BID) | ORAL | Status: DC
  Administered 2022-04-24: 5 mg via ORAL
  Filled 2022-04-24: qty 1

## 2022-04-24 MED ORDER — ISOSORBIDE MONONITRATE ER 30 MG PO TB24: 30.0000 mg | ORAL_TABLET | Freq: Every day | ORAL | 0 refills | Status: DC

## 2022-04-24 MED ORDER — LOSARTAN POTASSIUM 25 MG PO TABS
50.0000 mg | ORAL_TABLET | Freq: Every day | ORAL | Status: DC
  Administered 2022-04-24: 50 mg via ORAL
  Filled 2022-04-24: qty 2

## 2022-04-24 MED ORDER — ACETAMINOPHEN 325 MG PO TABS: 650.0000 mg | ORAL_TABLET | Freq: Once | ORAL | Status: DC

## 2022-04-24 MED ORDER — FLECAINIDE ACETATE 100 MG PO TABS
100.0000 mg | ORAL_TABLET | Freq: Two times a day (BID) | ORAL | Status: DC
  Administered 2022-04-24: 100 mg via ORAL
  Filled 2022-04-24: qty 1

## 2022-04-24 MED ORDER — VERAPAMIL HCL ER 120 MG PO TBCR
120.0000 mg | EXTENDED_RELEASE_TABLET | Freq: Once | ORAL | Status: DC
  Filled 2022-04-24: qty 1

## 2022-04-24 NOTE — Discharge Instructions (Addendum)
Continue home blood pressure medicines.  A additional blood pressure medicine was sent to her pharmacy.  This is a low-dose of a new class of antihypertensive medication.  Ensure that you continue to check your blood pressure at home frequently.  Low blood pressure can be even more dangerous than high blood pressure.  Discuss home medications with your primary care doctor.  You will need further trial and error to finalize the right medications for management of your blood pressure.  Discuss this with your cardiologist as well.  Also let your cardiologist know about the intermittent chest pain you have been having.  If it anytime you develop any new or worsening symptoms, please return to the emergency department.

## 2022-04-24 NOTE — ED Provider Notes (Addendum)
[facility] DEPT Provider Note   CSN: [id number] Arrival date & time: 04/23/22  2141     History  Chief Complaint  Patient presents with   Hypertension   Chest Pain    [name] is a 76 y.o. male.   Hypertension Associated symptoms include chest pain.  Chest Pain Patient presents for hypertension.  Medical history includes HTN, HLD, BPH, IBS, atrial fibrillation, GERD, anxiety, arthritis, prediabetes.  He gives a detailed history of his heart rate and blood pressure at home.  Starting on Wednesday, he began to have blood pressures elevated higher than normal.  He was instructed to take his 50 mg dose of losartan twice a day instead of once a day.  He tried this for 2 days without improvement.  He was then taken off of losartan and placed on valsartan.  He has continued to take this medication, again without improvement in his blood pressures.  His other home blood pressure medicine is verapamil.  He has been taking this as prescribed.  He takes Eliquis and flecainide for his atrial fibrillation and has maintained normal heart rate.  He has been under increased stress this week due to his wife being in the hospital for urosepsis.  He has been having conversations with his primary care doctor regarding blood pressure management.  Due to his persistently elevated blood pressures, patient presents to the ED.  In addition to his concerns of blood pressure, patient also endorses intermittent left-sided chest pain that has been ongoing for the past week and a half.  It has been mild, approximately 2/10 in severity.  It is not worsened with exertion.  It is not worsened postprandially.  He denies any other recent symptoms.     Home Medications Prior to Admission medications   Medication Sig Start Date End Date Taking? Authorizing Provider  Cholecalciferol (VITAMIN D3) 5000 units CAPS Take 10,000 Units by mouth daily.    Yes [provider]   diltiazem (CARDIZEM) 30 MG tablet Take 1 tablet every 4 hours AS NEEDED for AFIB heart rate >100 as long as blood pressure >100. 05/29/20  Yes Sherran Needs, NP  ELIQUIS 5 MG TABS tablet TAKE 1 TABLET BY MOUTH TWICE  DAILY Patient taking differently: Take 5 mg by mouth 2 (two) times daily. 04/19/22  Yes Allred, Jeneen Rinks, MD  escitalopram (LEXAPRO) 20 MG tablet Take 1/2 tablet (10 mg) daily. Patient taking differently: Take 10 mg by mouth at bedtime. 04/19/22  Yes Cranford, Kenney Houseman, NP  finasteride (PROSCAR) 5 MG tablet Take 5 mg by mouth daily.   Yes [provider]  Flaxseed, Linseed, (FLAX SEED OIL PO) Take 1 capsule by mouth daily.   Yes [provider]  flecainide (TAMBOCOR) 100 MG tablet TAKE 1 TABLET BY MOUTH  TWICE DAILY Patient taking differently: Take 100 mg by mouth 2 (two) times daily. 06/28/21  Yes Allred, Jeneen Rinks, MD  hydrOXYzine (ATARAX) 50 MG tablet TAKE 1 TO 2 TABLETS 1 TO 2 HOURS BEFORE BEDTIME IF NEEDED FOR SLEEP Patient taking differently: Take 25 mg by mouth at bedtime. 10/01/21  Yes Liane Comber, NP  isosorbide mononitrate (IMDUR) 30 MG 24 hr tablet Take 1 tablet (30 mg total) by mouth daily. 04/24/22  Yes Godfrey Pick, MD  Multiple Vitamin (MULTIVITAMIN) tablet Take 1 tablet by mouth daily.   Yes [provider]  Omega-3 Fatty Acids (FISH OIL PO) Take 1 capsule by mouth 2 (two) times daily.   Yes [provider]  rosuvastatin (CRESTOR) 20 MG tablet Take  1/2 tablet (10 mg) daily  for Cholesterol Patient taking differently: Take 10 mg by mouth See admin instructions. Take  1/2 tablet (10 mg) daily  for Cholesterol 12/23/21  Yes Cranford, Kenney Houseman, NP  traZODone (DESYREL) 150 MG tablet Take 75 mg by mouth at bedtime.   Yes [provider]  valsartan (DIOVAN) 320 MG tablet Take  1 tablet  Daily for BP Patient taking differently: Take 320 mg by mouth daily. 04/21/22  Yes Unk Pinto, MD  verapamil (CALAN-SR) 120 MG CR tablet Take  1  tablet  Daily  with Food  for BP & Heart Rhythm Patient taking differently: Take 120 mg by mouth at bedtime. 07/28/21  Yes Unk Pinto, MD  vitamin C (ASCORBIC ACID) 500 MG tablet Take 500 mg by mouth daily.   Yes [provider]  zinc gluconate 50 MG tablet Take 50 mg by mouth daily.   Yes [provider]      Allergies    Xarelto [rivaroxaban], Atorvastatin, Clonazepam, and Tamsulosin hcl    Review of Systems   Review of Systems  Cardiovascular:  Positive for chest pain.  Psychiatric/Behavioral:  The patient is nervous/anxious.   All other systems reviewed and are negative.   Physical Exam Updated Vital Signs BP (!) 151/87   Pulse 74   Temp 97.6 F (36.4 C) (Oral)   Resp 19   SpO2 94%  Physical Exam Vitals and nursing note reviewed.  Constitutional:      General: He is not in acute distress.    Appearance: He is well-developed and normal weight. He is not ill-appearing, toxic-appearing or diaphoretic.  HENT:     Head: Normocephalic and atraumatic.  Eyes:     Conjunctiva/sclera: Conjunctivae normal.  Neck:     Vascular: No JVD.  Cardiovascular:     Rate and Rhythm: Normal rate and regular rhythm.     Heart sounds: Murmur heard.  Pulmonary:     Effort: Pulmonary effort is normal. No tachypnea or respiratory distress.     Breath sounds: Normal breath sounds. No decreased breath sounds, wheezing, rhonchi or rales.  Chest:     Chest wall: No tenderness.  Abdominal:     Palpations: Abdomen is soft.     Tenderness: There is no abdominal tenderness.  Musculoskeletal:        General: No swelling. Normal range of motion.     Cervical back: Normal range of motion and neck supple.     Right lower leg: No edema.     Left lower leg: No edema.  Skin:    General: Skin is warm and dry.     Capillary Refill: Capillary refill takes less than 2 seconds.     Coloration: Skin is not cyanotic or pale.  Neurological:     General: No focal deficit present.      Mental Status: He is alert and oriented to person, place, and time.  Psychiatric:        Mood and Affect: Mood normal.        Behavior: Behavior normal.     ED Results / Procedures / Treatments   Labs (all labs ordered are listed, but only abnormal results are displayed) Labs Reviewed  CBC WITH DIFFERENTIAL/PLATELET  BASIC METABOLIC PANEL  TROPONIN I (HIGH SENSITIVITY)  TROPONIN I (HIGH SENSITIVITY)    EKG EKG Interpretation  Date/Time:  Saturday April 23 2022 22:25:11 EDT Ventricular Rate:  69 PR Interval:  199 QRS Duration: 92 QT Interval:  396 QTC Calculation: 425 R Axis:   53 Text Interpretation: Sinus rhythm No significant change from prior Reconfirmed by Cindee Lame [phone]) on 04/23/2022 10:29:26 PM  Radiology No results found.  Procedures Procedures    Medications Ordered in ED Medications  apixaban (ELIQUIS) tablet 5 mg (5 mg Oral Given 04/24/22 1505)  flecainide (TAMBOCOR) tablet 100 mg (100 mg Oral Given 04/24/22 1508)  verapamil (CALAN-SR) CR tablet 120 mg (has no administration in time range)  losartan (COZAAR) tablet 50 mg (50 mg Oral Given 04/24/22 1504)  nitroGLYCERIN (NITROGLYN) 2 % ointment 1 inch (1 inch Topical Given 04/24/22 1327)    ED Course/ Medical Decision Making/ A&P                           Medical Decision Making Risk OTC drugs. Prescription drug management.   This patient presents to the ED for concern of hypertension, this involves an extensive number of treatment options, and is a complaint that carries with it a high risk of complications and morbidity.  The differential diagnosis includes increased psychosocial stressors, untreated pain, not effective medication, medication nonadherence, undertreated essential hypertension, hypertensive urgency, hypertensive emergency   Co morbidities that complicate the patient evaluation  HTN, HLD, BPH, IBS, atrial fibrillation, GERD, anxiety, arthritis, prediabetes   Additional  history obtained:  Additional history obtained from N/A External records from outside source obtained and reviewed including EMR   Lab Tests:  I Ordered, and personally interpreted labs.  The pertinent results include: Normal kidney function, normal troponin x2, normal hemoglobin, no leukocytosis normal electrolytes  Cardiac Monitoring: / EKG:  The patient was maintained on a cardiac monitor.  I personally viewed and interpreted the cardiac monitored which showed an underlying rhythm of: Sinus rhythm   Problem List / ED Course / Critical interventions / Medication management  Patient is a highly functional 76 year old male presenting for concerns of hypertension.  He has had long-term management of his blood pressure with losartan and verapamil.  His atrial fibrillation is controlled with flecainide.  He is on Eliquis.  He is prescribed Cardizem to be taken as needed for rapid heart rate.  He has not taken this any time lately.  He has had increased blood pressure over the past 5 days.  He has undergone some medication changes, through the direction of his primary care doctor.  He was taken off losartan and placed on valsartan.  Blood pressures remain elevated.  Additionally, patient has had intermittent, mild left-sided chest pain over the past week and a half.  It is not worsened with exertion.  Currently, chest pain is present at 2/10 in severity.  Prior to being bedded in the ED, patient had a prolonged time in the waiting room.  During this time, EKG and lab work was obtained.  Patient's lab work, including initial troponin are unremarkable.  EKG shows sinus rhythm without any concerning ST segment or T wave abnormalities.  During this time in the waiting room, he did miss some of his home medications.  Upon being bedded in the ED, blood pressures in the range of 200/90.  Home verapamil was ordered.  NTG ointment was ordered.  Patient was given his morning doses of Eliquis and flecainide.  On  exam, patient is well-appearing.  Lungs are clear to auscultation.  No JVD is present.  No chest tenderness is present.  He does have a systolic cardiac murmur.  Per chart review, it does appear that this has been present on prior physical exam.  Patient's repeat troponin and EKG are reassuring.  Blood pressure improved to 140s over 90s.  He was advised to continue his home medications.  A low-dose Imdur is to be prescribed.  He is to continue following up with his primary care doctor and cardiologist for ongoing management of his blood pressure.  Currently, he has a cardiology appointment scheduled in 1 week.  He was advised to discuss his intermittent mild chest pain at this appointment.  He was encouraged to return for any new or worsening symptoms of concern.  Prior to discharge, he had further lowering of his blood pressure.  He also endorsed a headache.  I suspect this is from the NTG.  NGT was removed and patient was given Tylenol.  He was further advised to not start his Imdur until he discusses this further with his outpatient care doctors.  Patient to continue to monitor pressures at home.  He was discharged in stable condition. I ordered medication including NTG, Cozaar, verapamil for HTN Reevaluation of the patient after these medicines showed that the patient resolved I have reviewed the patients home medicines and have made adjustments as needed   Social Determinants of Health:  Has access to outpatient care         Final Clinical Impression(s) / ED Diagnoses Final diagnoses:  Hypertension, unspecified type    Rx / DC Orders ED Discharge Orders          Ordered    isosorbide mononitrate (IMDUR) 30 MG 24 hr tablet  Daily        04/24/22 1517              Godfrey Pick, MD 04/24/22 1519    Godfrey Pick, MD 04/24/22 1534

## 2022-04-25 NOTE — Progress Notes (Signed)
Electrophysiology Office Note Date: 05/02/2022  ID:  Johnathan, Perez 1946/01/08, MRN [id number]  PCP: Unk Pinto, MD Primary Cardiologist: None Electrophysiologist: Dr. Rayann Heman -> Dr. Curt Bears  CC: Electrophysiology follow up  [name] is a 76 y.o. male seen today for Dr. Rayann Heman . he presents today for routine electrophysiology followup as well as post ED follow up for HTN urgency. Since last being seen in our clinic the patient reports doing OK until his wife was admitted for Urosepsis. He began to have persistently elevated blood pressures and stress. He also had ~2/10 left chest discomfort in this same setting. No clear exertional or post-prandial component. BPs at home have ranged 858-850Y systolic, but were over 774 the day he was seen in ED. Started on imdur but having severe HAs   Device History: Medtronic loop recorder implanted 2018 for Atrial fibrillation  Past Medical History:  Diagnosis Date   A-fib ([place]) 2018   on eliquis   Anxiety    Arthritis    Benign prostatic hyperplasia    BPH (benign prostatic hypertrophy)    Carpal tunnel syndrome, bilateral    Early cataract    GERD (gastroesophageal reflux disease)    History of SCC (squamous cell carcinoma) of skin 10/04/2016   HNP (herniated nucleus pulposus), cervical    Hyperlipidemia    Hypertension 2007   IBS (irritable bowel syndrome)    Obesity    Other abnormal glucose    Pneumonia    " walking"   Pre-diabetes    Vitamin D deficiency    Wears glasses    Past Surgical History:  Procedure Laterality Date   ANTERIOR CERVICAL DECOMP/DISCECTOMY FUSION  01-25-2002   C4 - C5   ANTERIOR CERVICAL DECOMP/DISCECTOMY FUSION Bilateral 07/16/2018   Procedure: REMOVAL OF ANTERIOR CERVICAL PLATE, ANTERIOR CERVICAL DECOMPRESSION/DISCECTOMY FUSION CERVICAL THREE- CERVICAL FOUR;  Surgeon: Jovita Gamma, MD;  Location: [facility];  Service: Neurosurgery;  Laterality: Bilateral;  REMOVAL OF ANTERIOR  CERVICAL PLATE, ANTERIOR CERVICAL DECOMPRESSION/DISCECTOMY FUSION CERVICAL THREE- CERVICAL FOUR   ANTERIOR CRUCIATE LIGAMENT REPAIR  1990   RIGHT   APPENDECTOMY  02-07-2007   CARPAL TUNNEL RELEASE Right 07/16/2018   Procedure: CARPAL TUNNEL RELEASE;  Surgeon: Jovita Gamma, MD;  Location: [facility];  Service: Neurosurgery;  Laterality: Right;  CARPAL TUNNEL RELEASE   CATARACT EXTRACTION Right 01/2020   CERVICAL FUSION  2000   C5 - 6   COLONOSCOPY  05-30-2011   HEMORRHOIDS   HERNIA REPAIR     HIP SURGERY  1999   LEFT HIP --  REMOVAL CALCIUM BUILD-UP   LEFT ANKLE SURG.  [place]   LEFT ROTATOR CUFF REPAIR  2002   LOOP RECORDER INSERTION N/A 09/19/2016   Procedure: Loop Recorder Insertion;  Surgeon: Thompson Grayer, MD;  Location: [facility];  Service: Cardiovascular;  Laterality: N/A;   REVERSE SHOULDER ARTHROPLASTY Right 01/06/2022   Procedure: REVERSE SHOULDER ARTHROPLASTY;  Surgeon: Hiram Gash, MD;  Location: [facility];  Service: Orthopedics;  Laterality: Right;   TENDON RECONSTRUCTION Left 07/17/2013   Procedure: LEFT ELBOW LATERAL RECONSTRUCTION;  Surgeon: Schuyler Amor, MD;  Location: [facility];  Service: Orthopedics;  Laterality: Left;   TONSILLECTOMY     TRIGGER FINGER RELEASE Right 07/17/2013   Procedure: RELEASE A-1 PULLEY RIGHT RING FINGER;  Surgeon: Schuyler Amor, MD;  Location: [facility];  Service: Orthopedics;  Laterality: Right;  UMBILICAL HERNIA REPAIR  2003   XI ROBOTIC ASSISTED SIMPLE PROSTATECTOMY N/A 05/22/2019   Procedure: XI ROBOTIC ASSISTED SIMPLE PROSTATECTOMY;  Surgeon: Alexis Frock, MD;  Location: [facility];  Service: Urology;  Laterality: N/A;  3 HRS    Current Outpatient Medications  Medication Sig Dispense Refill   Cholecalciferol (VITAMIN D3) 5000 units CAPS Take 10,000 Units by mouth daily.      ELIQUIS 5 MG TABS tablet TAKE 1 TABLET BY MOUTH TWICE  DAILY (Patient  taking differently: Take 5 mg by mouth 2 (two) times daily.) 180 tablet 3   escitalopram (LEXAPRO) 20 MG tablet Take 1/2 tablet (10 mg) daily. (Patient taking differently: Take 10 mg by mouth at bedtime.) 90 tablet 3   finasteride (PROSCAR) 5 MG tablet Take 5 mg by mouth daily.     Flaxseed, Linseed, (FLAX SEED OIL PO) Take 1 capsule by mouth daily.     flecainide (TAMBOCOR) 100 MG tablet TAKE 1 TABLET BY MOUTH  TWICE DAILY (Patient taking differently: Take 100 mg by mouth 2 (two) times daily.) 180 tablet 3   hydrOXYzine (ATARAX) 50 MG tablet TAKE 1 TO 2 TABLETS 1 TO 2 HOURS BEFORE BEDTIME IF NEEDED FOR SLEEP (Patient taking differently: Take 25 mg by mouth at bedtime.) 60 tablet 2   isosorbide mononitrate (IMDUR) 30 MG 24 hr tablet Take 1 tablet (30 mg total) by mouth daily. 30 tablet 0   Multiple Vitamin (MULTIVITAMIN) tablet Take 1 tablet by mouth daily.     Omega-3 Fatty Acids (FISH OIL PO) Take 1 capsule by mouth 2 (two) times daily.     rosuvastatin (CRESTOR) 20 MG tablet Take  1/2 tablet (10 mg) daily  for Cholesterol (Patient taking differently: Take 10 mg by mouth See admin instructions. Take  1/2 tablet (10 mg) daily  for Cholesterol) 90 tablet 3   valsartan (DIOVAN) 320 MG tablet Take  1 tablet  Daily for BP (Patient taking differently: Take 320 mg by mouth daily.) 90 tablet 3   verapamil (CALAN-SR) 120 MG CR tablet Take  1 tablet  Daily  with Food  for BP & Heart Rhythm (Patient taking differently: Take 120 mg by mouth at bedtime.) 90 tablet 3   vitamin C (ASCORBIC ACID) 500 MG tablet Take 500 mg by mouth daily.     zinc gluconate 50 MG tablet Take 50 mg by mouth daily.     diltiazem (CARDIZEM) 30 MG tablet Take 1 tablet every 4 hours AS NEEDED for AFIB heart rate >100 as long as blood pressure >100. (Patient not taking: Reported on 05/02/2022) 45 tablet 1   traZODone (DESYREL) 150 MG tablet Take 75 mg by mouth at bedtime. (Patient not taking: Reported on 05/02/2022)     No current  facility-administered medications for this visit.    Allergies:   Xarelto [rivaroxaban], Atorvastatin, Clonazepam, and Tamsulosin hcl   Social History: Social History   Socioeconomic History   Marital status: Married    Spouse name: Not on file   Number of children: 1   Years of education: Not on file   Highest education level: Not on file  Occupational History   Occupation: Personnel officer  Tobacco Use   Smoking status: Never   Smokeless tobacco: Never  Vaping Use   Vaping Use: Never used  Substance and Sexual Activity   Alcohol use: Yes    Alcohol/week: 4.0 standard drinks of alcohol    Types: 4 Standard drinks or equivalent per week  Comment: OCCASIONAL   Drug use: No   Sexual activity: Not on file  Other Topics Concern   Not on file  Social History Narrative   Not on file   Social Determinants of Health   Financial Resource Strain: Low Risk  (05/22/2019)   Overall Financial Resource Strain (CARDIA)    Difficulty of Paying Living Expenses: Not hard at all  Food Insecurity: No Food Insecurity (05/22/2019)   Hunger Vital Sign    Worried About Running Out of Food in the Last Year: Never true    Ran Out of Food in the Last Year: Never true  Transportation Needs: No Transportation Needs (05/22/2019)   PRAPARE - Hydrologist (Medical): No    Lack of Transportation (Non-Medical): No  Physical Activity: Inactive (05/22/2019)   Exercise Vital Sign    Days of Exercise per Week: 0 days    Minutes of Exercise per Session: 0 min  Stress: No Stress Concern Present (05/22/2019)   [place]    Feeling of Stress : Only a little  Social Connections: Socially Integrated (05/22/2019)   Social Connection and Isolation Panel [NHANES]    Frequency of Communication with Friends and Family: More than three times a week    Frequency of Social Gatherings with Friends and Family: Once a  week    Attends Religious Services: More than 4 times per year    Active Member of [employer] or Organizations: Yes    Attends Music therapist: More than 4 times per year    Marital Status: Married  Human resources officer Violence: Not At Risk (05/22/2019)   Humiliation, Afraid, Rape, and Kick questionnaire    Fear of Current or Ex-Partner: No    Emotionally Abused: No    Physically Abused: No    Sexually Abused: No    Family History: Family History  Problem Relation Age of Onset   Heart disease Mother    Stroke Mother 46   Heart disease Father    Diabetes Father    Heart attack Father 59   Alcohol abuse Father      Review of Systems: All other systems reviewed and are otherwise negative except as noted above.  Physical Exam: Vitals:   05/02/22 1056  BP: (!) 130/90  Pulse: 79  SpO2: 95%  Weight: 237 lb 9.6 oz (107.8 kg)  Height: 6' (1.829 m)     GEN- The patient is well appearing, alert and oriented x 3 today.   HEENT: normocephalic, atraumatic; sclera clear, conjunctiva pink; hearing intact; oropharynx clear; neck supple  Lungs- Clear to ausculation bilaterally, normal work of breathing.  No wheezes, rales, rhonchi Heart- Regular rate and rhythm, no murmurs, rubs or gallops  GI- soft, non-tender, non-distended, bowel sounds present  Extremities- no clubbing, cyanosis, or edema  MS- no significant deformity or atrophy Skin- warm and dry, no rash or lesion; ILR pocket well healed Psych- euthymic mood, full affect Neuro- strength and sensation are intact  PPM Interrogation- reviewed in detail today,  See PACEART report  EKG:  EKG is not ordered today. The ekg ordered 04/25/2022 shows NSR at 69 bpm with stable intervals  Recent Labs: 07/28/2021: TSH 1.30 10/13/2021: Magnesium 2.0 04/19/2022: ALT 26 04/23/2022: BUN 9; Creatinine, Ser 0.78; Hemoglobin 16.5; Platelets 248; Potassium 4.2; Sodium 142   Wt Readings from Last 3 Encounters:  05/02/22 237 lb 9.6 oz  (107.8 kg)  04/19/22 236 lb (107 kg)  01/06/22 231 lb 14.8 oz (105.2 kg)     Other studies Reviewed: Additional studies/ records that were reviewed today include: Echo 08/2016 shows LVEF 55-60%, Previous EP office notes, Previous remote checks, Most recent labwork.   Myoview 12/2016 Nuclear stress EF: 54%. There was no ST segment deviation noted during stress. This is a low risk study. The left ventricular ejection fraction is mildly decreased (45-54%).   1. EF 54%, normal wall motion.  2. Fixed medium-sized, mild basal to mid inferolateral and inferior perfusion defect.  Given normal inferior wall motion, think most likely diaphragmatic attenuation.  No ischemia.   Assessment and Plan:  1.  Paroxysmal atrial fibrillation and atrial flutter  s/p Medtronic Loop recorder Loop previously EOS.  EKG 10/16 showed NSR with stable intervals on flecainide Continue flecainide 100 mg BID Continue elqiuis 5 mg BID   2. HTN Elevated today and seen in ED last week for HTN urgency STOP imdur with severe HAs Start coreg 6.25 mg BID, which will also benefit his flecainide therapy. Can titrate up as needed/tolerated with goal BP of <130/90.  3. HLD Continue crestor 10 mg daily  4. Chest discomfort Pretty atypical sounding, but being on flecainide we discussed considering re-doing his Myoview, or more likely getting a CT coronary. Especially if symptoms do not improve with better BP control.    Current medicines are reviewed at length with the patient today.    Disposition:   Follow up with EP APP  in 3-4 Weeks    Jacalyn Lefevre, PA-C  05/02/2022 11:11 AM  [facility] [address] [place] [address] [phone] (office) [phone] (fax)

## 2022-05-02 ENCOUNTER — Ambulatory Visit: Payer: Medicare Other | Attending: Physician Assistant | Admitting: Student

## 2022-05-02 ENCOUNTER — Encounter: Payer: Self-pay | Admitting: Student

## 2022-05-02 VITALS — BP 130/90 | HR 79 | Ht 72.0 in | Wt 237.6 lb

## 2022-05-02 DIAGNOSIS — E782 Mixed hyperlipidemia: Secondary | ICD-10-CM | POA: Insufficient documentation

## 2022-05-02 DIAGNOSIS — I480 Paroxysmal atrial fibrillation: Secondary | ICD-10-CM | POA: Diagnosis not present

## 2022-05-02 DIAGNOSIS — I10 Essential (primary) hypertension: Secondary | ICD-10-CM | POA: Insufficient documentation

## 2022-05-02 DIAGNOSIS — Z96611 Presence of right artificial shoulder joint: Secondary | ICD-10-CM | POA: Diagnosis not present

## 2022-05-02 DIAGNOSIS — M6281 Muscle weakness (generalized): Secondary | ICD-10-CM | POA: Diagnosis not present

## 2022-05-02 MED ORDER — CARVEDILOL 6.25 MG PO TABS: 6.2500 mg | ORAL_TABLET | Freq: Two times a day (BID) | ORAL | 1 refills | Status: DC

## 2022-05-02 NOTE — Patient Instructions (Signed)
Medication Instructions:  Your physician has recommended you make the following change in your medication:   DISCONTINUE: Isosorbide START: Carvedilol 6.'25mg'$  twice daily  *If you need a refill on your cardiac medications before your next appointment, please call your pharmacy*   Lab Work: None If you have labs (blood work) drawn today and your tests are completely normal, you will receive your results only by: [place] (if you have MyChart) OR A paper copy in the mail If you have any lab test that is abnormal or we need to change your treatment, we will call you to review the results.   Follow-Up: At [facility], you and your health needs are our priority.  As part of our continuing mission to provide you with exceptional heart care, we have created designated Provider Care Teams.  These Care Teams include your primary Cardiologist (physician) and Advanced Practice Providers (APPs -  Physician Assistants and Nurse Practitioners) who all work together to provide you with the care you need, when you need it.   Your next appointment:   As scheduled  Important Information About Sugar

## 2022-05-03 DIAGNOSIS — M19011 Primary osteoarthritis, right shoulder: Secondary | ICD-10-CM | POA: Diagnosis not present

## 2022-05-04 ENCOUNTER — Telehealth: Payer: Self-pay

## 2022-05-04 NOTE — Telephone Encounter (Signed)
     Patient  visit on 10/15  at [place] you been able to follow up with your primary care physician? yes  The patient was or was not able to obtain any needed medicine or equipment. yes  Are there diet recommendations that you are having difficulty following? na  Patient expresses understanding of discharge instructions and education provided has no other needs at this time. [place]arly[place], [facility], Care Management  [phone] [address], [place], [place] [address] Phone: [phone] Email: [email or web address]

## 2022-05-12 ENCOUNTER — Ambulatory Visit (INDEPENDENT_AMBULATORY_CARE_PROVIDER_SITE_OTHER): Payer: Medicare Other | Admitting: Podiatry

## 2022-05-12 DIAGNOSIS — L600 Ingrowing nail: Secondary | ICD-10-CM | POA: Diagnosis not present

## 2022-05-12 MED ORDER — NEOMYCIN-POLYMYXIN-HC 1 % OT SOLN: OTIC | 1 refills | Status: DC

## 2022-05-12 NOTE — Patient Instructions (Signed)

## 2022-05-12 NOTE — Addendum Note (Signed)
Addended by: Clovis Riley E on: 05/12/2022 11:07 AM   Modules accepted: Orders

## 2022-05-12 NOTE — Progress Notes (Signed)
Subjective:  Patient ID: [name], male    DOB: 1945/07/19,  MRN: [id number] HPI Chief Complaint  Patient presents with   Nail Problem    Hallux right - requesting toenail removal-patient currently on Eliquis   New Patient (Initial Visit)    Est pt 03/2019    76 y.o. male presents with the above complaint.   ROS: Denies fever chills nausea vomiting muscle aches pains calf pain back pain chest pain shortness of breath.  Past Medical History:  Diagnosis Date   A-fib ([place]) 2018   on eliquis   Anxiety    Arthritis    Benign prostatic hyperplasia    BPH (benign prostatic hypertrophy)    Carpal tunnel syndrome, bilateral    Early cataract    GERD (gastroesophageal reflux disease)    History of SCC (squamous cell carcinoma) of skin 10/04/2016   HNP (herniated nucleus pulposus), cervical    Hyperlipidemia    Hypertension 2007   IBS (irritable bowel syndrome)    Obesity    Other abnormal glucose    Pneumonia    " walking"   Pre-diabetes    Vitamin D deficiency    Wears glasses    Past Surgical History:  Procedure Laterality Date   ANTERIOR CERVICAL DECOMP/DISCECTOMY FUSION  01-25-2002   C4 - C5   ANTERIOR CERVICAL DECOMP/DISCECTOMY FUSION Bilateral 07/16/2018   Procedure: REMOVAL OF ANTERIOR CERVICAL PLATE, ANTERIOR CERVICAL DECOMPRESSION/DISCECTOMY FUSION CERVICAL THREE- CERVICAL FOUR;  Surgeon: Jovita Gamma, MD;  Location: [facility];  Service: Neurosurgery;  Laterality: Bilateral;  REMOVAL OF ANTERIOR CERVICAL PLATE, ANTERIOR CERVICAL DECOMPRESSION/DISCECTOMY FUSION CERVICAL THREE- CERVICAL FOUR   ANTERIOR CRUCIATE LIGAMENT REPAIR  1990   RIGHT   APPENDECTOMY  02-07-2007   CARPAL TUNNEL RELEASE Right 07/16/2018   Procedure: CARPAL TUNNEL RELEASE;  Surgeon: Jovita Gamma, MD;  Location: [facility];  Service: Neurosurgery;  Laterality: Right;  CARPAL TUNNEL RELEASE   CATARACT EXTRACTION Right 01/2020   CERVICAL FUSION  2000   C5 - 6   COLONOSCOPY  05-30-2011    HEMORRHOIDS   HERNIA REPAIR     HIP SURGERY  1999   LEFT HIP --  REMOVAL CALCIUM BUILD-UP   LEFT ANKLE SURG.  [place]   LEFT ROTATOR CUFF REPAIR  2002   LOOP RECORDER INSERTION N/A 09/19/2016   Procedure: Loop Recorder Insertion;  Surgeon: Thompson Grayer, MD;  Location: [facility];  Service: Cardiovascular;  Laterality: N/A;   REVERSE SHOULDER ARTHROPLASTY Right 01/06/2022   Procedure: REVERSE SHOULDER ARTHROPLASTY;  Surgeon: Hiram Gash, MD;  Location: [facility];  Service: Orthopedics;  Laterality: Right;   TENDON RECONSTRUCTION Left 07/17/2013   Procedure: LEFT ELBOW LATERAL RECONSTRUCTION;  Surgeon: Schuyler Amor, MD;  Location: [facility];  Service: Orthopedics;  Laterality: Left;   TONSILLECTOMY     TRIGGER FINGER RELEASE Right 07/17/2013   Procedure: RELEASE A-1 PULLEY RIGHT RING FINGER;  Surgeon: Schuyler Amor, MD;  Location: [facility];  Service: Orthopedics;  Laterality: Right;   UMBILICAL HERNIA REPAIR  2003   XI ROBOTIC ASSISTED SIMPLE PROSTATECTOMY N/A 05/22/2019   Procedure: XI ROBOTIC ASSISTED SIMPLE PROSTATECTOMY;  Surgeon: Alexis Frock, MD;  [facility];  Service: Urology;  Laterality: N/A;  3 HRS    Current Outpatient Medications:    carvedilol (COREG) 6.25 MG tablet, Take 1 tablet (6.25 mg total) by mouth 2 (two) times daily., Disp: 60 tablet,  Rfl: 1   Cholecalciferol (VITAMIN D3) 5000 units CAPS, Take 10,000 Units by mouth daily. , Disp: , Rfl:    diltiazem (CARDIZEM) 30 MG tablet, Take 1 tablet every 4 hours AS NEEDED for AFIB heart rate >100 as long as blood pressure >100. (Patient not taking: Reported on 05/02/2022), Disp: 45 tablet, Rfl: 1   ELIQUIS 5 MG TABS tablet, TAKE 1 TABLET BY MOUTH TWICE  DAILY (Patient taking differently: Take 5 mg by mouth 2 (two) times daily.), Disp: 180 tablet, Rfl: 3   escitalopram (LEXAPRO) 20 MG tablet, Take 1/2 tablet (10 mg) daily.  (Patient taking differently: Take 10 mg by mouth at bedtime.), Disp: 90 tablet, Rfl: 3   finasteride (PROSCAR) 5 MG tablet, Take 5 mg by mouth daily., Disp: , Rfl:    Flaxseed, Linseed, (FLAX SEED OIL PO), Take 1 capsule by mouth daily., Disp: , Rfl:    flecainide (TAMBOCOR) 100 MG tablet, TAKE 1 TABLET BY MOUTH  TWICE DAILY (Patient taking differently: Take 100 mg by mouth 2 (two) times daily.), Disp: 180 tablet, Rfl: 3   hydrOXYzine (ATARAX) 50 MG tablet, TAKE 1 TO 2 TABLETS 1 TO 2 HOURS BEFORE BEDTIME IF NEEDED FOR SLEEP (Patient taking differently: Take 25 mg by mouth at bedtime.), Disp: 60 tablet, Rfl: 2   Multiple Vitamin (MULTIVITAMIN) tablet, Take 1 tablet by mouth daily., Disp: , Rfl:    Omega-3 Fatty Acids (FISH OIL PO), Take 1 capsule by mouth 2 (two) times daily., Disp: , Rfl:    rosuvastatin (CRESTOR) 20 MG tablet, Take  1/2 tablet (10 mg) daily  for Cholesterol (Patient taking differently: Take 10 mg by mouth See admin instructions. Take  1/2 tablet (10 mg) daily  for Cholesterol), Disp: 90 tablet, Rfl: 3   valsartan (DIOVAN) 320 MG tablet, Take  1 tablet  Daily for BP (Patient taking differently: Take 320 mg by mouth daily.), Disp: 90 tablet, Rfl: 3   verapamil (CALAN-SR) 120 MG CR tablet, Take  1 tablet  Daily  with Food  for BP & Heart Rhythm (Patient taking differently: Take 120 mg by mouth at bedtime.), Disp: 90 tablet, Rfl: 3   vitamin C (ASCORBIC ACID) 500 MG tablet, Take 500 mg by mouth daily., Disp: , Rfl:    zinc gluconate 50 MG tablet, Take 50 mg by mouth daily., Disp: , Rfl:   Allergies  Allergen Reactions   Xarelto [Rivaroxaban] Other (See Comments)    Severe body aches   Imdur [Isosorbide Nitrate] Other (See Comments)    Severe Headaches   Atorvastatin Other (See Comments)    Myalgias   Clonazepam Other (See Comments)    "Made me jittery"   Tamsulosin Hcl Other (See Comments)    Hypotension   Review of Systems Objective:  There were no vitals filed for this  visit.  General: Well developed, nourished, in no acute distress, alert and oriented x3   Dermatological: Skin is warm, dry and supple bilateral. Nails x 10 are well maintained; remaining integument appears unremarkable at this time. There are no open sores, no preulcerative lesions, no rash or signs of infection present.  Severe painful nail dystrophy hallux right.  Currently not infected  Vascular: Dorsalis Pedis artery and Posterior Tibial artery pedal pulses are 2/4 bilateral with immedate capillary fill time. Pedal hair growth present. No varicosities and no lower extremity edema present bilateral.   Neruologic: Grossly intact via light touch bilateral. Vibratory intact via tuning fork bilateral. Protective threshold with Jobie Quaker  monofilament intact to all pedal sites bilateral. Patellar and Achilles deep tendon reflexes 2+ bilateral. No Babinski or clonus noted bilateral.   Musculoskeletal: No gross boney pedal deformities bilateral. No pain, crepitus, or limitation noted with foot and ankle range of motion bilateral. Muscular strength 5/5 in all groups tested bilateral.  Gait: Unassisted, Nonantalgic.    Radiographs:  None taken  Assessment & Plan:   Assessment: Nail dystrophy ingrown nail hallux right  Plan: Total chemical matrixectomy.  Tolerated procedure well after local anesthetic was administered.  I used Surgicel to help with bleeding.  He was given both oral written home-going structure for the care and soaking of the toe as well as a prescription for Cortisporin Otic to be applied twice daily after soaking.  Tolerated procedure well and will follow-up with me in about 2 weeks for recheck     Lizmarie Witters T. [place], [place]

## 2022-05-17 DIAGNOSIS — M19011 Primary osteoarthritis, right shoulder: Secondary | ICD-10-CM | POA: Diagnosis not present

## 2022-05-20 ENCOUNTER — Other Ambulatory Visit: Payer: Self-pay | Admitting: Orthopedic Surgery

## 2022-05-20 DIAGNOSIS — M25511 Pain in right shoulder: Secondary | ICD-10-CM

## 2022-05-20 DIAGNOSIS — M4840XA Fatigue fracture of vertebra, site unspecified, initial encounter for fracture: Secondary | ICD-10-CM

## 2022-05-20 DIAGNOSIS — M19011 Primary osteoarthritis, right shoulder: Secondary | ICD-10-CM | POA: Diagnosis not present

## 2022-05-24 ENCOUNTER — Ambulatory Visit
Admission: RE | Admit: 2022-05-24 | Discharge: 2022-05-24 | Disposition: A | Discharge status: Home / Self Care | Payer: Medicare Other | Source: Ambulatory Visit | Attending: Orthopedic Surgery | Admitting: Orthopedic Surgery

## 2022-05-24 DIAGNOSIS — M25511 Pain in right shoulder: Secondary | ICD-10-CM

## 2022-05-24 DIAGNOSIS — Z9889 Other specified postprocedural states: Secondary | ICD-10-CM | POA: Diagnosis not present

## 2022-05-24 DIAGNOSIS — S42124A Nondisplaced fracture of acromial process, right shoulder, initial encounter for closed fracture: Secondary | ICD-10-CM | POA: Diagnosis not present

## 2022-05-24 DIAGNOSIS — Z8739 Personal history of other diseases of the musculoskeletal system and connective tissue: Secondary | ICD-10-CM | POA: Diagnosis not present

## 2022-05-26 ENCOUNTER — Encounter: Payer: Medicare Other | Admitting: Physician Assistant

## 2022-05-27 ENCOUNTER — Other Ambulatory Visit: Payer: Medicare Other

## 2022-05-27 NOTE — Progress Notes (Unsigned)
Electrophysiology Office Note Date: 05/31/2022  ID:  [name], [name] 1945-11-03, MRN [id number]  PCP: Unk Pinto, MD Primary Cardiologist: Thompson Grayer, MD Electrophysiologist: Dr. Rayann Heman -> Melida Quitter, MD   CC: ILR follow-up  [name] is a 76 y.o. male seen today for Dr. Myles Gip . he presents today for routine electrophysiology followup. Since last being seen in our clinic the patient reports doing well. He did develop a hairline fracture in his R shoulder replacement and is immobilized for 4-6 weeks. No further chest discomfort. BP at home 100-120s He has several readings of 150-160 but states this is when he hasn't taken his medicine yet.  he denies chest pain, palpitations, dyspnea, PND, orthopnea, nausea, vomiting, dizziness, syncope, edema, weight gain, or early satiety. .  Device History: Medtronic loop recorder implanted 2018 for Atrial fibrillation, EOS  Past Medical History:  Diagnosis Date   A-fib ([place]) 2018   on eliquis   Anxiety    Arthritis    Benign prostatic hyperplasia    BPH (benign prostatic hypertrophy)    Carpal tunnel syndrome, bilateral    Early cataract    GERD (gastroesophageal reflux disease)    History of SCC (squamous cell carcinoma) of skin 10/04/2016   HNP (herniated nucleus pulposus), cervical    Hyperlipidemia    Hypertension 2007   IBS (irritable bowel syndrome)    Obesity    Other abnormal glucose    Pneumonia    " walking"   Pre-diabetes    Vitamin D deficiency    Wears glasses    Past Surgical History:  Procedure Laterality Date   ANTERIOR CERVICAL DECOMP/DISCECTOMY FUSION  01-25-2002   C4 - C5   ANTERIOR CERVICAL DECOMP/DISCECTOMY FUSION Bilateral 07/16/2018   Procedure: REMOVAL OF ANTERIOR CERVICAL PLATE, ANTERIOR CERVICAL DECOMPRESSION/DISCECTOMY FUSION CERVICAL THREE- CERVICAL FOUR;  Surgeon: Jovita Gamma, MD;  Location: [facility];  Service: Neurosurgery;  Laterality: Bilateral;  REMOVAL OF ANTERIOR  CERVICAL PLATE, ANTERIOR CERVICAL DECOMPRESSION/DISCECTOMY FUSION CERVICAL THREE- CERVICAL FOUR   ANTERIOR CRUCIATE LIGAMENT REPAIR  1990   RIGHT   APPENDECTOMY  02-07-2007   CARPAL TUNNEL RELEASE Right 07/16/2018   Procedure: CARPAL TUNNEL RELEASE;  Surgeon: Jovita Gamma, MD;  Location: [facility];  Service: Neurosurgery;  Laterality: Right;  CARPAL TUNNEL RELEASE   CATARACT EXTRACTION Right 01/2020   CERVICAL FUSION  2000   C5 - 6   COLONOSCOPY  05-30-2011   HEMORRHOIDS   HERNIA REPAIR     HIP SURGERY  1999   LEFT HIP --  REMOVAL CALCIUM BUILD-UP   LEFT ANKLE SURG.  [place]   LEFT ROTATOR CUFF REPAIR  2002   LOOP RECORDER INSERTION N/A 09/19/2016   Procedure: Loop Recorder Insertion;  Surgeon: Thompson Grayer, MD;  Location: [facility];  Service: Cardiovascular;  Laterality: N/A;   REVERSE SHOULDER ARTHROPLASTY Right 01/06/2022   Procedure: REVERSE SHOULDER ARTHROPLASTY;  Surgeon: Hiram Gash, MD;  Location: [facility];  Service: Orthopedics;  Laterality: Right;   TENDON RECONSTRUCTION Left 07/17/2013   Procedure: LEFT ELBOW LATERAL RECONSTRUCTION;  Surgeon: Schuyler Amor, MD;  Location: [facility];  Service: Orthopedics;  Laterality: Left;   TONSILLECTOMY     TRIGGER FINGER RELEASE Right 07/17/2013   Procedure: RELEASE A-1 PULLEY RIGHT RING FINGER;  Surgeon: Schuyler Amor, MD;  Location: [facility];  Service: Orthopedics;  Laterality: Right;   UMBILICAL HERNIA REPAIR  2003   XI ROBOTIC ASSISTED SIMPLE PROSTATECTOMY N/A 05/22/2019   Procedure: XI ROBOTIC ASSISTED SIMPLE PROSTATECTOMY;  Surgeon: Alexis Frock, MD;  [facility];  Service: Urology;  Laterality: N/A;  3 HRS    Current Outpatient Medications  Medication Sig Dispense Refill   Cholecalciferol (VITAMIN D3) 5000 units CAPS Take 10,000 Units by mouth daily.      diltiazem (CARDIZEM) 30 MG tablet Take 1 tablet every 4 hours AS NEEDED  for AFIB heart rate >100 as long as blood pressure >100. 45 tablet 1   ELIQUIS 5 MG TABS tablet TAKE 1 TABLET BY MOUTH TWICE  DAILY (Patient taking differently: Take 5 mg by mouth 2 (two) times daily.) 180 tablet 3   escitalopram (LEXAPRO) 20 MG tablet Take 1/2 tablet (10 mg) daily. (Patient taking differently: Take 10 mg by mouth at bedtime.) 90 tablet 3   finasteride (PROSCAR) 5 MG tablet Take 5 mg by mouth daily.     Flaxseed, Linseed, (FLAX SEED OIL PO) Take 1 capsule by mouth daily.     flecainide (TAMBOCOR) 100 MG tablet TAKE 1 TABLET BY MOUTH  TWICE DAILY (Patient taking differently: Take 100 mg by mouth 2 (two) times daily.) 180 tablet 3   hydrOXYzine (ATARAX) 50 MG tablet TAKE 1 TO 2 TABLETS 1 TO 2 HOURS BEFORE BEDTIME IF NEEDED FOR SLEEP (Patient taking differently: Take 25 mg by mouth at bedtime.) 60 tablet 2   Multiple Vitamin (MULTIVITAMIN) tablet Take 1 tablet by mouth daily.     NEOMYCIN-POLYMYXIN-HYDROCORTISONE (CORTISPORIN) 1 % SOLN OTIC solution Apply 1-2 drops to toe BID after soaking 10 mL 1   Omega-3 Fatty Acids (FISH OIL PO) Take 1 capsule by mouth 2 (two) times daily.     rosuvastatin (CRESTOR) 20 MG tablet Take  1/2 tablet (10 mg) daily  for Cholesterol (Patient taking differently: Take 10 mg by mouth See admin instructions. Take  1/2 tablet (10 mg) daily  for Cholesterol) 90 tablet 3   valsartan (DIOVAN) 320 MG tablet Take  1 tablet  Daily for BP (Patient taking differently: Take 320 mg by mouth daily.) 90 tablet 3   verapamil (CALAN-SR) 120 MG CR tablet Take  1 tablet  Daily  with Food  for BP & Heart Rhythm (Patient taking differently: Take 120 mg by mouth at bedtime.) 90 tablet 3   vitamin C (ASCORBIC ACID) 500 MG tablet Take 500 mg by mouth daily.     zinc gluconate 50 MG tablet Take 50 mg by mouth daily.     carvedilol (COREG) 6.25 MG tablet Take 1 tablet (6.25 mg total) by mouth 2 (two) times daily. 180 tablet 3   No current facility-administered medications for this  visit.    Allergies:   Xarelto [rivaroxaban], Imdur [isosorbide nitrate], Atorvastatin, Clonazepam, and Tamsulosin hcl   Social History: Social History   Socioeconomic History   Marital status: Married    Spouse name: Not on file   Number of children: 1   Years of education: Not on file   Highest education level: Not on file  Occupational History   Occupation: Personnel officer  Tobacco Use   Smoking status: Never   Smokeless tobacco: Never  Vaping Use   Vaping Use: Never used  Substance and Sexual Activity   Alcohol use: Yes    Alcohol/week: 4.0 standard drinks of alcohol    Types: 4 Standard drinks or equivalent per week    Comment: OCCASIONAL   Drug use: No  Sexual activity: Not on file  Other Topics Concern   Not on file  Social History Narrative   Not on file   Social Determinants of Health   Financial Resource Strain: Low Risk  (05/22/2019)   Overall Financial Resource Strain (CARDIA)    Difficulty of Paying Living Expenses: Not hard at all  Food Insecurity: No Food Insecurity (05/22/2019)   Hunger Vital Sign    Worried About Running Out of Food in the Last Year: Never true    Ran Out of Food in the Last Year: Never true  Transportation Needs: No Transportation Needs (05/22/2019)   PRAPARE - Hydrologist (Medical): No    Lack of Transportation (Non-Medical): No  Physical Activity: Inactive (05/22/2019)   Exercise Vital Sign    Days of Exercise per Week: 0 days    Minutes of Exercise per Session: 0 min  Stress: No Stress Concern Present (05/22/2019)   [place]    Feeling of Stress : Only a little  Social Connections: Socially Integrated (05/22/2019)   Social Connection and Isolation Panel [NHANES]    Frequency of Communication with Friends and Family: More than three times a week    Frequency of Social Gatherings with Friends and Family: Once a week     Attends Religious Services: More than 4 times per year    Active Member of [employer] or Organizations: Yes    Attends Music therapist: More than 4 times per year    Marital Status: Married  Human resources officer Violence: Not At Risk (05/22/2019)   Humiliation, Afraid, Rape, and Kick questionnaire    Fear of Current or Ex-Partner: No    Emotionally Abused: No    Physically Abused: No    Sexually Abused: No    Family History: Family History  Problem Relation Age of Onset   Heart disease Mother    Stroke Mother 20   Heart disease Father    Diabetes Father    Heart attack Father 73   Alcohol abuse Father      Review of Systems: All other systems reviewed and are otherwise negative except as noted above.  Physical Exam: Vitals:   05/31/22 0950  BP: 122/76  Pulse: 66  SpO2: 94%  Weight: 236 lb 9.6 oz (107.3 kg)  Height: 6' (1.829 m)     GEN- The patient is well appearing, alert and oriented x 3 today.   HEENT: normocephalic, atraumatic; sclera clear, conjunctiva pink; hearing intact; oropharynx clear; neck supple  Lungs- Clear to ausculation bilaterally, normal work of breathing.  No wheezes, rales, rhonchi Heart- Regular rate and rhythm, no murmurs, rubs or gallops  GI- soft, non-tender, non-distended, bowel sounds present  Extremities- no clubbing, cyanosis, or edema  MS- no significant deformity or atrophy Skin- warm and dry, no rash or lesion; ILR pocket well healed Psych- euthymic mood, full affect Neuro- strength and sensation are intact  PPM Interrogation- reviewed in detail today,  See PACEART report  EKG:  EKG is ordered today. The ekg ordered today shows NSR at 66 bpm with stable intervals  Recent Labs: 07/28/2021: TSH 1.30 10/13/2021: Magnesium 2.0 04/19/2022: ALT 26 04/23/2022: BUN 9; Creatinine, Ser 0.78; Hemoglobin 16.5; Platelets 248; Potassium 4.2; Sodium 142   Wt Readings from Last 3 Encounters:  05/31/22 236 lb 9.6 oz (107.3 kg)  05/02/22  237 lb 9.6 oz (107.8 kg)  04/19/22 236 lb (107 kg)  Other studies Reviewed: Additional studies/ records that were reviewed today include: Echo 08/2016 shows LVEF 55-60%, Previous EP office notes, Previous remote checks, Most recent labwork.    Myoview 12/2016 Nuclear stress EF: 54%. There was no ST segment deviation noted during stress. This is a low risk study. The left ventricular ejection fraction is mildly decreased (45-54%).   1. EF 54%, normal wall motion.  2. Fixed medium-sized, mild basal to mid inferolateral and inferior perfusion defect.  Given normal inferior wall motion, think most likely diaphragmatic attenuation.  No ischemia.   Assessment and Plan:  1.  Paroxysmal atrial fibrillation and atrial flutter  s/p Medtronic Loop recorder Loop previously EOS.  EKG 10/16 showed NSR with stable intervals on flecainide Continue flecainide 100 mg BID Continue coreg 6.25 mg BID Continue elqiuis 5 mg BID    2. HTN Well controlled.     3. HLD Continue crestor 10 mg daily   4. Chest discomfort  No further outside of stress and with better controlled BP Offered myoview or Coronary CT, he is not interested currently with resolution of symptoms. He will call if he has further for testing.   Current medicines are reviewed at length with the patient today.   Labs/ tests ordered today include: Orders Placed This Encounter  Procedures   EKG 12-Lead   Disposition:   Follow up with Dr. Myles Gip  in 6  Months  Signed, Annamaria Helling  05/31/2022 10:06 AM  [facility] [address] [place] [address] [phone] (office) [phone] (fax)

## 2022-05-31 ENCOUNTER — Encounter: Payer: Self-pay | Admitting: Student

## 2022-05-31 ENCOUNTER — Ambulatory Visit (INDEPENDENT_AMBULATORY_CARE_PROVIDER_SITE_OTHER): Payer: Medicare Other | Admitting: Podiatry

## 2022-05-31 ENCOUNTER — Ambulatory Visit: Payer: Medicare Other | Attending: Physician Assistant | Admitting: Student

## 2022-05-31 ENCOUNTER — Encounter: Payer: Self-pay | Admitting: Podiatry

## 2022-05-31 VITALS — BP 122/76 | HR 66 | Ht 72.0 in | Wt 236.6 lb

## 2022-05-31 DIAGNOSIS — I480 Paroxysmal atrial fibrillation: Secondary | ICD-10-CM | POA: Insufficient documentation

## 2022-05-31 DIAGNOSIS — E782 Mixed hyperlipidemia: Secondary | ICD-10-CM | POA: Insufficient documentation

## 2022-05-31 DIAGNOSIS — L600 Ingrowing nail: Secondary | ICD-10-CM

## 2022-05-31 DIAGNOSIS — I10 Essential (primary) hypertension: Secondary | ICD-10-CM | POA: Diagnosis not present

## 2022-05-31 DIAGNOSIS — Z9889 Other specified postprocedural states: Secondary | ICD-10-CM

## 2022-05-31 MED ORDER — CARVEDILOL 6.25 MG PO TABS: 6.2500 mg | ORAL_TABLET | Freq: Two times a day (BID) | ORAL | 3 refills | Status: DC

## 2022-05-31 NOTE — Patient Instructions (Signed)
Medication Instructions:  Your physician recommends that you continue on your current medications as directed. Please refer to the Current Medication list given to you today.  *If you need a refill on your cardiac medications before your next appointment, please call your pharmacy*   Lab Work: None ordered If you have labs (blood work) drawn today and your tests are completely normal, you will receive your results only by: [place] (if you have MyChart) OR A paper copy in the mail If you have any lab test that is abnormal or we need to change your treatment, we will call you to review the results.   Testing/Procedures: None ordered   Follow-Up: At [facility], you and your health needs are our priority.  As part of our continuing mission to provide you with exceptional heart care, we have created designated Provider Care Teams.  These Care Teams include your primary Cardiologist (physician) and Advanced Practice Providers (APPs -  Physician Assistants and Nurse Practitioners) who all work together to provide you with the care you need, when you need it.   Your next appointment:   6 month(s)  The format for your next appointment:   In Person  Provider:   Doralee Albino, MD    Thank you for choosing [facility]!!   [phone]  Other Instructions   Important Information About Sugar

## 2022-06-05 NOTE — Progress Notes (Signed)
He presents today for follow-up of his matricectomy.  He denies fever chills nausea vomit muscle aches and pains states it seems to be doing just fine.  Objective: There is no erythema edema cellulitis drainage or odor to the hallux right.  Appears to be healing very nicely.  Assessment: Resolving matrixectomy.  Plan: Follow-up with me only on an as-needed basis otherwise recommended continue to soak until complete healing has occurred.

## 2022-06-22 ENCOUNTER — Encounter: Payer: Self-pay | Admitting: Internal Medicine

## 2022-06-23 ENCOUNTER — Other Ambulatory Visit: Payer: Medicare Other

## 2022-06-24 DIAGNOSIS — M19011 Primary osteoarthritis, right shoulder: Secondary | ICD-10-CM | POA: Diagnosis not present

## 2022-06-27 ENCOUNTER — Other Ambulatory Visit: Payer: Self-pay | Admitting: Student

## 2022-06-27 DIAGNOSIS — M6281 Muscle weakness (generalized): Secondary | ICD-10-CM | POA: Diagnosis not present

## 2022-06-27 DIAGNOSIS — Z96611 Presence of right artificial shoulder joint: Secondary | ICD-10-CM | POA: Diagnosis not present

## 2022-07-12 DIAGNOSIS — M47816 Spondylosis without myelopathy or radiculopathy, lumbar region: Secondary | ICD-10-CM | POA: Diagnosis not present

## 2022-07-13 DIAGNOSIS — Z96611 Presence of right artificial shoulder joint: Secondary | ICD-10-CM | POA: Diagnosis not present

## 2022-07-13 DIAGNOSIS — M6281 Muscle weakness (generalized): Secondary | ICD-10-CM | POA: Diagnosis not present

## 2022-07-15 DIAGNOSIS — M47816 Spondylosis without myelopathy or radiculopathy, lumbar region: Secondary | ICD-10-CM | POA: Diagnosis not present

## 2022-07-21 DIAGNOSIS — M25511 Pain in right shoulder: Secondary | ICD-10-CM | POA: Diagnosis not present

## 2022-07-27 DIAGNOSIS — M6281 Muscle weakness (generalized): Secondary | ICD-10-CM | POA: Diagnosis not present

## 2022-07-27 DIAGNOSIS — Z96611 Presence of right artificial shoulder joint: Secondary | ICD-10-CM | POA: Diagnosis not present

## 2022-08-01 ENCOUNTER — Other Ambulatory Visit: Payer: Self-pay | Admitting: Internal Medicine

## 2022-08-02 ENCOUNTER — Encounter: Payer: Self-pay | Admitting: Internal Medicine

## 2022-08-02 ENCOUNTER — Ambulatory Visit (INDEPENDENT_AMBULATORY_CARE_PROVIDER_SITE_OTHER): Payer: Medicare Other | Admitting: Internal Medicine

## 2022-08-02 VITALS — BP 118/70 | HR 73 | Temp 97.9°F | Resp 16 | Ht 72.0 in | Wt 241.6 lb

## 2022-08-02 DIAGNOSIS — Z136 Encounter for screening for cardiovascular disorders: Secondary | ICD-10-CM | POA: Diagnosis not present

## 2022-08-02 DIAGNOSIS — R7309 Other abnormal glucose: Secondary | ICD-10-CM

## 2022-08-02 DIAGNOSIS — I10 Essential (primary) hypertension: Secondary | ICD-10-CM

## 2022-08-02 DIAGNOSIS — E782 Mixed hyperlipidemia: Secondary | ICD-10-CM

## 2022-08-02 DIAGNOSIS — I700 Atherosclerosis of aorta: Secondary | ICD-10-CM | POA: Diagnosis not present

## 2022-08-02 DIAGNOSIS — Z125 Encounter for screening for malignant neoplasm of prostate: Secondary | ICD-10-CM

## 2022-08-02 DIAGNOSIS — Z79899 Other long term (current) drug therapy: Secondary | ICD-10-CM | POA: Diagnosis not present

## 2022-08-02 DIAGNOSIS — N401 Enlarged prostate with lower urinary tract symptoms: Secondary | ICD-10-CM

## 2022-08-02 DIAGNOSIS — Z8249 Family history of ischemic heart disease and other diseases of the circulatory system: Secondary | ICD-10-CM

## 2022-08-02 DIAGNOSIS — E559 Vitamin D deficiency, unspecified: Secondary | ICD-10-CM

## 2022-08-02 DIAGNOSIS — Z1211 Encounter for screening for malignant neoplasm of colon: Secondary | ICD-10-CM

## 2022-08-02 NOTE — Progress Notes (Signed)
Future Appointments  Date Time Provider Department  08/02/2022                   cpe 11:00 AM Unk Pinto, MD [facility]  10/12/2022                    wellness 11:00 AM Darrol Jump, NP [facility]  08/09/2023                   cpe 10:00 AM Unk Pinto, MD [facility]          This very nice 77 y.o. MWM presents for a Screening /Preventative Visit & comprehensive evaluation and management of multiple medical co-morbidities.  Patient has been followed for HTN, HLD, Prediabetes and Vitamin D Deficiency.  Chest CT scan in April 2019 showed Aortic Atherosclerosis.          HTN predates since  2007. Patient's BP has been controlled at home.  Today's BP is at goal - 118/70.   In 2018,  patient was dx'd with pAfib & started on Eliquis for CHADsVASC2  by Dr Rayann Heman. Patient denies any cardiac symptoms as chest pain, palpitations, shortness of breath, dizziness or ankle swelling.        Patient's hyperlipidemia is controlled with diet and Rosuvastatin . Patient denies myalgias or other medication SE's. Last lipids were at goal :  Lab Results  Component Value Date   CHOL 161 04/19/2022   HDL 46 04/19/2022   LDLCALC 92 04/19/2022   TRIG 135 04/19/2022   CHOLHDL 3.5 04/19/2022         Patient has hx/o prediabetes (A1c 5.7% /2013).  Patient denies reactive hypoglycemic symptoms, visual blurring, diabetic polys or paresthesias. Last A1c was normal & at goal :   Lab Results  Component Value Date   HGBA1C 5.5 04/19/2022         Finally, patient has history of Vitamin D Deficiency  ("33" /2008) and last vitamin D was not at goal  (70-100) :   Lab Results  Component Value Date   VD25OH 47 04/19/2022       Current Outpatient Medications  Medication Instructions   VITAMIN C  500 mg Daily   carvedilol 6.25 mg   2 times daily   diltiazem  30 MG tablet Take 1 tablet every 4 hours AS NEEDED for AFIB heart rate >100    ELIQUIS 5 MG TABS tablet TAKE 1 TABLET TWICE  DAILY    escitalopram 20 MG tablet Take 1/2 tablet daily.   PROSCAR 5 mg Daily   FLAX SEED OIL 1 capsule  Daily   flecainide 100 MG tablet TAKE 1 TABLETTWICE  DAILY   hydrOXYzine  50 MG tablet TAKE 1-2 TABS 1 -2 HRS BEFORE BEDTIME   Multiple Vitamin  1 tablet Oral, Daily   CORTISPORIN  1 % SOLN OTIC soln Apply 1-2 drops to toe BID after soaking   Omega-3 FISH OIL 1 capsule, Oral, 2 times daily   rosuvastatin 20 MG tablet Take  1/2 tablet  daily    valsartan 320 MG tablet Take  1 tablet  Daily for BP   verapamil -SR 120 MG  Take  1 tablet  Daily     Vitamin D 10,000 Unit Daily   Zinc   50 mg Daily       Allergies  Allergen Reactions   Xarelto [Rivaroxaban] Other (See Comments)    Severe body aches   Atorvastatin Other (  See Comments)    Myalgias   Clonazepam Other (See Comments)    "Made me jittery"   Tamsulosin Hcl Other (See Comments)    Hypotension     Past Medical History:  Diagnosis Date   A-fib ([place])    Arthritis    Benign prostatic hyperplasia    BPH (benign prostatic hypertrophy)    Carpal tunnel syndrome, bilateral    Early cataract    History of SCC (squamous cell carcinoma) of skin 10/04/2016   HNP (herniated nucleus pulposus), cervical    Hyperlipidemia    Hypertension 2007   IBS (irritable bowel syndrome)    Mild acid reflux occasional   Obesity    Other abnormal glucose    Pneumonia    " walking"   Pre-diabetes    Vitamin D deficiency    Wears glasses      Health Maintenance  Topic Date Due   Zoster Vaccines- Shingrix (1 of 2) Never done   COVID-19 Vaccine (3 - Moderna risk series) 10/07/2019   Fecal DNA (Cologuard)  05/21/2021   TETANUS/TDAP  12/15/2025   Pneumonia Vaccine 98+ Years old  Completed   INFLUENZA VACCINE  Completed   Hepatitis C Screening  Completed   HPV VACCINES  Aged Out     Immunization History  Administered Date(s) Administered   DTaP 07/11/2005   Influenza Whole 04/02/2013   Influenza, High Dose Seasonal PF 05/01/2014,  05/21/2015, 03/29/2016, 05/31/2017, 05/05/2018, 05/01/2019, 04/19/2021   Moderna Sars-Covid-2 Vaccination 08/10/2019, 09/09/2019   Pneumococcal Conjugate-13 05/01/2014   Pneumococcal Polysaccharide-23 09/08/2008, 03/29/2016   Td 12/16/2015   Zoster, Live 12/09/2008    Last Colon -  08/08/2006  Cologard - 05/21/2018 - Negative - recc 3 year f/u due Nov 2022 - Overdue   Past Surgical History:  Procedure Laterality Date   ANTERIOR CERVICAL DECOMP/DISCECTOMY FUSION  01-25-2002   C4 - C5   ANTERIOR CERVICAL DECOMP/DISCECTOMY FUSION Bilateral 07/16/2018   Procedure: REMOVAL OF ANTERIOR CERVICAL PLATE, ANTERIOR CERVICAL DECOMPRESSION/DISCECTOMY FUSION CERVICAL THREE- CERVICAL FOUR;  Surgeon: Jovita Gamma, MD;  Location: [place];  Service: Neurosurgery;  Laterality: Bilateral;  REMOVAL OF ANTERIOR CERVICAL PLATE, ANTERIOR CERVICAL DECOMPRESSION/DISCECTOMY FUSION CERVICAL THREE- CERVICAL FOUR   ANTERIOR CRUCIATE LIGAMENT REPAIR  1990   RIGHT   APPENDECTOMY  02-07-2007   CARPAL TUNNEL RELEASE Right 07/16/2018   Procedure: CARPAL TUNNEL RELEASE;  Surgeon: Jovita Gamma, MD;  Location: [place];  Service: Neurosurgery;  Laterality: Right;  CARPAL TUNNEL RELEASE   CATARACT EXTRACTION Right 01/2020   CERVICAL FUSION  2000   C5 - 6   COLONOSCOPY  05-30-2011   HEMORRHOIDS   HERNIA REPAIR     HIP SURGERY  1999   LEFT HIP --  REMOVAL CALCIUM BUILD-UP   LEFT ANKLE SURG.  [place]   LEFT ROTATOR CUFF REPAIR  2002   LOOP RECORDER INSERTION N/A 09/19/2016   Procedure: Loop Recorder Insertion;  Surgeon: Thompson Grayer, MD;  Location: [facility];  Service: Cardiovascular;  Laterality: N/A;   TENDON RECONSTRUCTION Left 07/17/2013   Procedure: LEFT ELBOW LATERAL RECONSTRUCTION;  Surgeon: Schuyler Amor, MD;  Location: [place];  Service: Orthopedics;  Laterality: Left;   TONSILLECTOMY     TRIGGER FINGER RELEASE Right 07/17/2013   Procedure: RELEASE  A-1 PULLEY RIGHT RING FINGER;  Surgeon: Schuyler Amor, MD;  Location: [place];  Service: Orthopedics;  Laterality: Right;   UMBILICAL HERNIA REPAIR  2003   XI ROBOTIC ASSISTED SIMPLE PROSTATECTOMY N/A 05/22/2019   Procedure: XI ROBOTIC ASSISTED SIMPLE PROSTATECTOMY;  Surgeon: Alexis Frock, MD;  Location: [facility];  Service: Urology;  Laterality: N/A;  3 HRS     Family History  Problem Relation Age of Onset   Heart disease Mother    Stroke Mother 9   Heart disease Father    Diabetes Father    Heart attack Father 41   Alcohol abuse Father      Social History   Tobacco Use   Smoking status: Never   Smokeless tobacco: Never  Vaping Use   Vaping Use: Never used  Substance Use Topics   Alcohol use: Yes    Alcohol/week: 4.0 standard drinks    Types: 4 Standard drinks or equivalent per week    Comment: OCCASIONAL   Drug use: No      ROS Constitutional: Denies fever, chills, weight loss/gain, headaches, insomnia,  night sweats or change in appetite. Does c/o fatigue. Eyes: Denies redness, blurred vision, diplopia, discharge, itchy or watery eyes.  ENT: Denies discharge, congestion, post nasal drip, epistaxis, sore throat, earache, hearing loss, dental pain, Tinnitus, Vertigo, Sinus pain or snoring.  Cardio: Denies chest pain, palpitations, irregular heartbeat, syncope, dyspnea, diaphoresis, orthopnea, PND, claudication or edema Respiratory: denies cough, dyspnea, DOE, pleurisy, hoarseness, laryngitis or wheezing.  Gastrointestinal: Denies dysphagia, heartburn, reflux, water brash, pain, cramps, nausea, vomiting, bloating, diarrhea, constipation, hematemesis, melena, hematochezia, jaundice or hemorrhoids Genitourinary: Denies dysuria, frequency, urgency, nocturia, hesitancy, discharge, hematuria or flank pain Musculoskeletal: Denies arthralgia, myalgia, stiffness, Jt. Swelling, pain, limp or strain/sprain. Denies Falls. Skin: Denies puritis, rash, hives,  warts, acne, eczema or change in skin lesion Neuro: No weakness, tremor, incoordination, spasms, paresthesia or pain Psychiatric: Denies confusion, memory loss or sensory loss. Denies Depression. Endocrine: Denies change in weight, skin, hair change, nocturia, and paresthesia, diabetic polys, visual blurring or hyper / hypo glycemic episodes.  Heme/Lymph: No excessive bleeding, bruising or enlarged lymph nodes.   Physical Exam  BP 118/70   Pulse 73   Temp 97.9 F (36.6 C)   Resp 16   Ht 6' (1.829 m)   Wt 241 lb 9.6 oz (109.6 kg)   SpO2 97%   BMI 32.77 kg/m   General Appearance: over      nourished and well groomed and in no apparent distress.  Eyes: PERRLA, EOMs, conjunctiva no swelling or erythema, normal fundi and vessels. Sinuses: No frontal/maxillary tenderness ENT/Mouth: EACs patent / TMs  nl. Nares clear without erythema, swelling, mucoid exudates. Oral hygiene is good. No erythema, swelling, or exudate. Tongue normal, non-obstructing. Tonsils not swollen or erythematous. Hearing normal.  Neck: Supple, thyroid not palpable. No bruits, nodes or JVD. Respiratory: Respiratory effort normal.  BS equal and clear bilateral without rales, rhonci, wheezing or stridor. Cardio: Heart sounds are normal with regular rate and rhythm and no murmurs, rubs or gallops. Peripheral pulses are normal and equal bilaterally without edema. No aortic or femoral bruits. Chest: symmetric with normal excursions and percussion.  Abdomen: Soft, with Nl bowel sounds. Nontender, no guarding, rebound, hernias, masses, or organomegaly.  Lymphatics: Non tender without lymphadenopathy.  Musculoskeletal: Full ROM all peripheral extremities, joint stability, 5/5 strength, and normal gait. Skin: Warm and dry without rashes, lesions, cyanosis, clubbing or  ecchymosis.  Neuro: Cranial nerves intact, reflexes equal bilaterally. Normal muscle tone, no cerebellar symptoms. Sensation intact.  Pysch: Alert and oriented X  3 with normal affect, insight and judgment appropriate.  Assessment and Plan   1. Essential hypertension  - EKG 12-Lead - Urinalysis, Routine w reflex microscopic - Microalbumin / creatinine urine ratio - CBC with Differential/Platelet - COMPLETE METABOLIC PANEL WITH GFR - Magnesium - TSH  2. Hyperlipidemia, mixed  - EKG 12-Lead - Lipid panel - TSH  3. Abnormal glucose  - EKG 12-Lead - Hemoglobin A1c - Insulin, random  4. Vitamin D deficiency  - VITAMIN D 25 Hydroxy  5. Aortic atherosclerosis ([place]) by Chest CT scan on 10/11/2017  - EKG 12-Lead - Lipid panel  6. Benign prostatic hyperplasia with lower urinary tract symptoms  - PSA  7. Screening for colorectal cancer  - Cologuard  8. Prostate cancer screening  - PSA  9. Screening for heart disease  - EKG 12-Lead  10. FHx: heart disease  - EKG 12-Lead  11. Medication management  - Urinalysis, Routine w reflex microscopic - Microalbumin / creatinine urine ratio - CBC with Differential/Platelet - COMPLETE METABOLIC PANEL WITH GFR - Magnesium - Lipid panel - TSH - Hemoglobin A1c - Insulin, random - VITAMIN D 25 Hydroxy            Patient was counseled in prudent diet, weight control to achieve/maintain BMI less than 25, BP monitoring, regular exercise and medications as discussed.  Discussed med effects and SE's. Routine screening labs and tests as requested with regular follow-up as recommended. Over 40 minutes of exam, counseling, chart review and high complex critical decision making was performed   Kirtland Bouchard, MD

## 2022-08-02 NOTE — Patient Instructions (Signed)
Due to recent changes in healthcare laws, you may see the results of your imaging and laboratory studies on MyChart before your provider has had a chance to review them.  We understand that in some cases there may be results that are confusing or concerning to you. Not all laboratory results come back in the same time frame and the provider may be waiting for multiple results in order to interpret others.  Please give [place] 48 hours in order for your provider to thoroughly review all the results before contacting the office for clarification of your results.   +++++++++++++++++++++++++++++++  Vit D  & Vit C 1,000 mg   are recommended to help protect  against the Covid-19 and other Corona viruses.    Also it's recommended  to take  Zinc 50 mg  to help  protect against the Covid-19   and best place to get  is also on [email or web address]  and don't pay more than 6-8 cents /pill !  ================================ Coronavirus (COVID-19) Are you at risk?  Are you at risk for the Coronavirus (COVID-19)?  To be considered HIGH RISK for Coronavirus (COVID-19), you have to meet the following criteria:  Traveled to [place], [place], [place], [place] or [place]; or in the [place] to [place], [place], [place]  or [place]; and have fever, cough, and shortness of breath within the last 2 weeks of travel OR Been in close contact with a person diagnosed with COVID-19 within the last 2 weeks and have  fever, cough,and shortness of breath  IF YOU DO NOT MEET THESE CRITERIA, YOU ARE CONSIDERED LOW RISK FOR COVID-19.  What to do if you are HIGH RISK for COVID-19?  If you are having a medical emergency, call 911. Seek medical care right away. Before you go to a doctor's office, urgent care or emergency department,  call ahead and tell them about your recent travel, contact with someone diagnosed with COVID-19   and your symptoms.  You should receive instructions from your physician's office regarding  next steps of care.  When you arrive at healthcare provider, tell the healthcare staff immediately you have returned from  visiting [place], [place], [place], [place] or [place]; or traveled in the [place] to [place], [place],  [place] or [place] in the last two weeks or you have been in close contact with a person diagnosed with  COVID-19 in the last 2 weeks.   Tell the health care staff about your symptoms: fever, cough and shortness of breath. After you have been seen by a medical provider, you will be either: Tested for (COVID-19) and discharged home on quarantine except to seek medical care if  symptoms worsen, and asked to  Stay home and avoid contact with others until you get your results (4-5 days)  Avoid travel on public transportation if possible (such as bus, train, or airplane) or Sent to the Emergency Department by EMS for evaluation, COVID-19 testing  and  possible admission depending on your condition and test results.  What to do if you are LOW RISK for COVID-19?  Reduce your risk of any infection by using the same precautions used for avoiding the common cold or flu:  Wash your hands often with soap and warm water for at least 20 seconds.  If soap and water are not readily available,  use an alcohol-based hand sanitizer with at least 60% alcohol.  If coughing or sneezing, cover your mouth and nose by coughing  or sneezing into the elbow areas of your shirt or coat,  into a tissue or into your sleeve (not your hands). Avoid shaking hands with others and consider head nods or verbal greetings only. Avoid touching your eyes, nose, or mouth with unwashed hands.  Avoid close contact with people who are sick. Avoid places or events with large numbers of people in one location, like concerts or sporting events. Carefully consider travel plans you have or are making. If you are planning any travel outside or inside the [place], visit the CDC's Travelers' Health webpage for  the latest health notices. If you have some symptoms but not all symptoms, continue to monitor at home and seek medical attention  if your symptoms worsen. If you are having a medical emergency, call 911. >>>>>>>>>>>>>>>>>>>>>>>>>>>>>>>>>>>>>>>>>>>>>>>>>>> We Do NOT Approve of [employer] > > > > > > > > > > > > > > > > > > > > > > > > > > > > > > > > > > >  > >    Preventive Care for Adults  A healthy lifestyle and preventive care can promote health and wellness. Preventive health guidelines for men include the following key practices: A routine yearly physical is a good way to check with your health care provider about your health and preventative screening. It is a chance to share any concerns and updates on your health and to receive a thorough exam. Visit your dentist for a routine exam and preventative care every 6 months. Brush your teeth twice a day and floss once a day. Good oral hygiene prevents tooth decay and gum disease. The frequency of eye exams is based on your age, health, family medical history, use of contact lenses, and other factors. Follow your health care provider's recommendations for frequency of eye exams. Eat a healthy diet. Foods such as vegetables, fruits, whole grains, low-fat dairy products, and lean protein foods contain the nutrients you need without too many calories. Decrease your intake of foods high in solid fats, added sugars, and salt. Eat the right amount of calories for you. Get information about a proper diet from your health care provider, if necessary. Regular physical exercise is one of the most important things you can do for your health. Most adults should get at least 150 minutes of moderate-intensity exercise (any activity that increases your heart rate and causes you to sweat) each week. In addition, most adults need muscle-strengthening exercises on 2 or more days a week. Maintain a healthy weight. The body mass index (BMI) is a screening  tool to identify possible weight problems. It provides an estimate of body fat based on height and weight. Your health care provider can find your BMI and can help you achieve or maintain a healthy weight. For adults 20 years and older: A BMI below 18.5 is considered underweight. A BMI of 18.5 to 24.9 is normal. A BMI of 25 to 29.9 is considered overweight. A BMI of 30 and above is considered obese. Maintain normal blood lipids and cholesterol levels by exercising and minimizing your intake of saturated fat. Eat a balanced diet with plenty of fruit and vegetables. Blood tests for lipids and cholesterol should begin at age 58 and be repeated every 5 years. If your lipid or cholesterol levels are high, you are over 50, or you are at high risk for heart disease, you may need your cholesterol levels checked more frequently. Ongoing high lipid and cholesterol levels should be  treated with medicines if diet and exercise are not working. If you smoke, find out from your health care provider how to quit. If you do not use tobacco, do not start. Lung cancer screening is recommended for adults aged 47-80 years who are at high risk for developing lung cancer because of a history of smoking. A yearly low-dose CT scan of the lungs is recommended for people who have at least a 30-pack-year history of smoking and are a current smoker or have quit within the past 15 years. A pack year of smoking is smoking an average of 1 pack of cigarettes a day for 1 year (for example: 1 pack a day for 30 years or 2 packs a day for 15 years). Yearly screening should continue until the smoker has stopped smoking for at least 15 years. Yearly screening should be stopped for people who develop a health problem that would prevent them from having lung cancer treatment. If you choose to drink alcohol, do not have more than 2 drinks per day. One drink is considered to be 12 ounces (355 mL) of beer, 5 ounces (148 mL) of wine, or 1.5 ounces (44  mL) of liquor. Avoid use of street drugs. Do not share needles with anyone. Ask for help if you need support or instructions about stopping the use of drugs. High blood pressure causes heart disease and increases the risk of stroke. Your blood pressure should be checked at least every 1-2 years. Ongoing high blood pressure should be treated with medicines, if weight loss and exercise are not effective. If you are 33-66 years old, ask your health care provider if you should take aspirin to prevent heart disease. Diabetes screening involves taking a blood sample to check your fasting blood sugar level. Testing should be considered at a younger age or be carried out more frequently if you are overweight and have at least 1 risk factor for diabetes. Colorectal cancer can be detected and often prevented. Most routine colorectal cancer screening begins at the age of 21 and continues through age 56. However, your health care provider may recommend screening at an earlier age if you have risk factors for colon cancer. On a yearly basis, your health care provider may provide home test kits to check for hidden blood in the stool. Use of a small camera at the end of a tube to directly examine the colon (sigmoidoscopy or colonoscopy) can detect the earliest forms of colorectal cancer. Talk to your health care provider about this at age 47, when routine screening begins. Direct exam of the colon should be repeated every 5-10 years through age 10, unless early forms of precancerous polyps or small growths are found. Hepatitis C blood testing is recommended for all people born from [birth year] through 1965 and any individual with known risks for hepatitis C. Screening for abdominal aortic aneurysm (AAA)  by ultrasound is recommended for people who have history of high blood pressure or who are current or former smokers. Healthy men should  receive prostate-specific antigen (PSA) blood tests as part of routine cancer screening.  Talk with your health care provider about prostate cancer screening. Testicular cancer screening is  recommended for adult males. Screening includes self-exam, a health care provider exam, and other screening tests. Consult with your health care provider about any symptoms you have or any concerns you have about testicular cancer. Use sunscreen. Apply sunscreen liberally and repeatedly throughout the day. You should seek shade when your shadow is shorter than  you. Protect yourself by wearing long sleeves, pants, a wide-brimmed hat, and sunglasses year round, whenever you are outdoors. Once a month, do a whole-body skin exam, using a mirror to look at the skin on your back. Tell your health care provider about new moles, moles that have irregular borders, moles that are larger than a pencil eraser, or moles that have changed in shape or color. Stay current with required vaccines (immunizations). Influenza vaccine. All adults should be immunized every year. Tetanus, diphtheria, and acellular pertussis (Td, Tdap) vaccine. An adult who has not previously received Tdap or who does not know his vaccine status should receive 1 dose of Tdap. This initial dose should be followed by tetanus and diphtheria toxoids (Td) booster doses every 10 years. Adults with an unknown or incomplete history of completing a 3-dose immunization series with Td-containing vaccines should begin or complete a primary immunization series including a Tdap dose. Adults should receive a Td booster every 10 years. Zoster vaccine. One dose is recommended for adults aged 60 years or older unless certain conditions are present.  PREVNAR - Pneumococcal 13-valent conjugate (PCV13) vaccine. When indicated, a person who is uncertain of his immunization history and has no record of immunization should receive the PCV13 vaccine. An adult aged 19 years or older who has certain medical conditions and has not been previously immunized should receive 1  dose of PCV13 vaccine. This PCV13 should be followed with a dose of pneumococcal polysaccharide (PPSV23) vaccine. The PPSV23 vaccine dose should be obtained 1 or more year(s)after the dose of PCV13 vaccine. An adult aged 19 years or older who has certain medical conditions and previously received 1 or more doses of PPSV23 vaccine should receive 1 dose of PCV13. The PCV13 vaccine dose should be obtained 1 or more years after the last PPSV23 vaccine dose.  PNEUMOVAX - Pneumococcal polysaccharide (PPSV23) vaccine. When PCV13 is also indicated, PCV13 should be obtained first. All adults aged 65 years and older should be immunized. An adult younger than age 65 years who has certain medical conditions should be immunized. Any person who resides in a nursing home or long-term care facility should be immunized. An adult smoker should be immunized. People with an immunocompromised condition and certain other conditions should receive both PCV13 and PPSV23 vaccines. People with human immunodeficiency virus (HIV) infection should be immunized as soon as possible after diagnosis. Immunization during chemotherapy or radiation therapy should be avoided. Routine use of PPSV23 vaccine is not recommended for American Indians, [place] Natives, or people younger than 65 years unless there are medical conditions that require PPSV23 vaccine. When indicated, people who have unknown immunization and have no record of immunization should receive PPSV23 vaccine. One-time revaccination 5 years after the first dose of PPSV23 is recommended for people aged 19-64 years who have chronic kidney failure, nephrotic syndrome, asplenia, or immunocompromised conditions. People who received 1-2 doses of PPSV23 before age 65 years should receive another dose of PPSV23 vaccine at age 65 years or later if at least 5 years have passed since the previous dose. Doses of PPSV23 are not needed for people immunized with PPSV23 at or after age 65  years.  Hepatitis A vaccine. Adults who wish to be protected from this disease, have certain high-risk conditions, work with hepatitis A-infected animals, work in hepatitis A research labs, or travel to or work in countries with a high rate of hepatitis A should be immunized. Adults who were previously unvaccinated and who anticipate close contact   with an international adoptee during the first 60 days after arrival in the [place] from a country with a high rate of hepatitis A should be immunized.  Hepatitis B vaccine. Adults should be immunized if they wish to be protected from this disease, have certain high-risk conditions, may be exposed to blood or other infectious body fluids, are household contacts or sex partners of hepatitis B positive people, are clients or workers in certain care facilities, or travel to or work in countries with a high rate of hepatitis B.  Preventive Service / Frequency  Ages 62 and over Blood pressure check. Lipid and cholesterol check. Lung cancer screening. / Every year if you are aged 2-80 years and have a 30-pack-year history of smoking and currently smoke or have quit within the past 15 years. Yearly screening is stopped once you have quit smoking for at least 15 years or develop a health problem that would prevent you from having lung cancer treatment. Fecal occult blood test (FOBT) of stool. You may not have to do this test if you get a colonoscopy every 10 years. Flexible sigmoidoscopy** or colonoscopy.** / Every 5 years for a flexible sigmoidoscopy or every 10 years for a colonoscopy beginning at age 44 and continuing until age 49. Hepatitis C blood test.** / For all people born from [birth year] through 1965 and any individual with known risks for hepatitis C. Abdominal aortic aneurysm (AAA) screening./ Screening current or former smokers or have Hypertension. Skin self-exam. / Monthly. Influenza vaccine. / Every year. Tetanus, diphtheria, and acellular  pertussis (Tdap/Td) vaccine.** / 1 dose of Td every 10 years.  Zoster vaccine.** / 1 dose for adults aged 44 years or older.         Pneumococcal 13-valent conjugate (PCV13) vaccine.   Pneumococcal polysaccharide (PPSV23) vaccine.   Hepatitis A vaccine.** / Consult your health care provider. Hepatitis B vaccine.** / Consult your health care provider. Screening for abdominal aortic aneurysm (AAA)  by ultrasound is recommended for people who have history of high blood pressure or who are current or former smokers. ++++++++++ Recommend Adult Low Dose Aspirin or  coated  Aspirin 81 mg daily  To reduce risk of Colon Cancer 40 %,  Skin Cancer 26 % ,  Malignant Melanoma 46%  and  Pancreatic cancer 60% ++++++++++++++++++++++ Vitamin D goal  is between 70-100.  Please make sure that you are taking your Vitamin D as directed.  It is very important as a natural anti-inflammatory  helping hair, skin, and nails, as well as reducing stroke and heart attack risk.  It helps your bones and helps with mood. It also decreases numerous cancer risks so please take it as directed.  Low Vit D is associated with a 200-300% higher risk for CANCER  and 200-300% higher risk for HEART   ATTACK  &  STROKE.   .....................................Marland Kitchen It is also associated with higher death rate at younger ages,  autoimmune diseases like Rheumatoid arthritis, Lupus, Multiple Sclerosis.    Also many other serious conditions, like depression, Alzheimer's Dementia, infertility, muscle aches, fatigue, fibromyalgia - just to name a few. ++++++++++++++++++++++ Recommend the book "The END of DIETING" by Dr Excell Seltzer  & the book "The END of DIABETES " by Dr Excell Seltzer At [email or web address] - get book & Audio CD's    Being diabetic has a  300% increased risk for heart attack, stroke, cancer, and alzheimer- type vascular dementia. It is very important that you work harder with diet by  avoiding all foods that are white. Avoid  white rice (brown & wild rice is OK), white potatoes (sweetpotatoes in moderation is OK), White bread or wheat bread or anything made out of white flour like bagels, donuts, rolls, buns, biscuits, cakes, pastries, cookies, pizza crust, and pasta (made from white flour & egg whites) - vegetarian pasta or spinach or wheat pasta is OK. Multigrain breads like Arnold's or Pepper[employer], or multigrain sandwich thins or flatbreads.  Diet, exercise and weight loss can reverse and cure diabetes in the early stages.  Diet, exercise and weight loss is very important in the control and prevention of complications of diabetes which affects every system in your body, ie. Brain - dementia/stroke, eyes - glaucoma/blindness, heart - heart attack/heart failure, kidneys - dialysis, stomach - gastric paralysis, intestines - malabsorption, nerves - severe painful neuritis, circulation - gangrene & loss of a leg(s), and finally cancer and Alzheimers.    I recommend avoid fried & greasy foods,  sweets/candy, white rice (brown or wild rice or Quinoa is OK), white potatoes (sweet potatoes are OK) - anything made from white flour - bagels, doughnuts, rolls, buns, biscuits,white and wheat breads, pizza crust and traditional pasta made of white flour & egg white(vegetarian pasta or spinach or wheat pasta is OK).  Multi-grain bread is OK - like multi-grain flat bread or sandwich thins. Avoid alcohol in excess. Exercise is also important.    Eat all the vegetables you want - avoid meat, especially red meat and dairy - especially cheese.  Cheese is the most concentrated form of trans-fats which is the worst thing to clog up our arteries. Veggie cheese is OK which can be found in the fresh produce section at [employer] or [employer] or [employer]  ++++++++++++++++++++++ DASH Eating Plan  DASH stands for "Dietary Approaches to Stop Hypertension."   The DASH eating plan is a healthy eating plan that has been shown to reduce high  blood pressure (hypertension). Additional health benefits may include reducing the risk of type 2 diabetes mellitus, heart disease, and stroke. The DASH eating plan may also help with weight loss. WHAT DO I NEED TO KNOW ABOUT THE DASH EATING PLAN? For the DASH eating plan, you will follow these general guidelines: Choose foods with a percent daily value for sodium of less than 5% (as listed on the food label). Use salt-free seasonings or herbs instead of table salt or sea salt. Check with your health care provider or pharmacist before using salt substitutes. Eat lower-sodium products, often labeled as "lower sodium" or "no salt added." Eat fresh foods. Eat more vegetables, fruits, and low-fat dairy products. Choose whole grains. Look for the word "whole" as the first word in the ingredient list. Choose fish  Limit sweets, desserts, sugars, and sugary drinks. Choose heart-healthy fats. Eat veggie cheese  Eat more home-cooked food and less restaurant, buffet, and fast food. Limit fried foods. Cook foods using methods other than frying. Limit canned vegetables. If you do use them, rinse them well to decrease the sodium. When eating at a restaurant, ask that your food be prepared with less salt, or no salt if possible.                      WHAT FOODS CAN I EAT? Read Dr Fara Olden Fuhrman's books on The End of Dieting & The End of Diabetes  Grains Whole grain or whole wheat bread. Brown rice. Whole grain or whole wheat pasta. Quinoa, bulgur, and  whole grain cereals. Low-sodium cereals. Corn or whole wheat flour tortillas. Whole grain cornbread. Whole grain crackers. Low-sodium crackers.  Vegetables Fresh or frozen vegetables (raw, steamed, roasted, or grilled). Low-sodium or reduced-sodium tomato and vegetable juices. Low-sodium or reduced-sodium tomato sauce and paste. Low-sodium or reduced-sodium canned vegetables.   Fruits All fresh, canned (in natural juice), or frozen fruits.  Protein  Products  All fish and seafood.  Dried beans, peas, or lentils. Unsalted nuts and seeds. Unsalted canned beans.  Dairy Low-fat dairy products, such as skim or 1% milk, 2% or reduced-fat cheeses, low-fat ricotta or cottage cheese, or plain low-fat yogurt. Low-sodium or reduced-sodium cheeses.  Fats and Oils Tub margarines without trans fats. Light or reduced-fat mayonnaise and salad dressings (reduced sodium). Avocado. Safflower, olive, or canola oils. Natural peanut or almond butter.  Other Unsalted popcorn and pretzels. The items listed above may not be a complete list of recommended foods or beverages. Contact your dietitian for more options.  ++++++++++++++++++++  WHAT FOODS ARE NOT RECOMMENDED? Grains/ White flour or wheat flour White bread. White pasta. White rice. Refined cornbread. Bagels and croissants. Crackers that contain trans fat.  Vegetables  Creamed or fried vegetables. Vegetables in a . Regular canned vegetables. Regular canned tomato sauce and paste. Regular tomato and vegetable juices.  Fruits Dried fruits. Canned fruit in light or heavy syrup. Fruit juice.  Meat and Other Protein Products Meat in general - RED meat & White meat.  Fatty cuts of meat. Ribs, chicken wings, all processed meats as bacon, sausage, bologna, salami, fatback, hot dogs, bratwurst and packaged luncheon meats.  Dairy Whole or 2% milk, cream, half-and-half, and cream cheese. Whole-fat or sweetened yogurt. Full-fat cheeses or blue cheese. Non-dairy creamers and whipped toppings. Processed cheese, cheese spreads, or cheese curds.  Condiments Onion and garlic salt, seasoned salt, table salt, and sea salt. Canned and packaged gravies. Worcestershire sauce. Tartar sauce. Barbecue sauce. Teriyaki sauce. Soy sauce, including reduced sodium. Steak sauce. Fish sauce. Oyster sauce. Cocktail sauce. Horseradish. Ketchup and mustard. Meat flavorings and tenderizers. Bouillon cubes. Hot sauce. Tabasco sauce.  Marinades. Taco seasonings. Relishes.  Fats and Oils Butter, stick margarine, lard, shortening and bacon fat. Coconut, palm kernel, or palm oils. Regular salad dressings.  Pickles and olives. Salted popcorn and pretzels.  The items listed above may not be a complete list of foods and beverages to avoid.

## 2022-08-03 ENCOUNTER — Encounter: Payer: Self-pay | Admitting: Internal Medicine

## 2022-08-03 ENCOUNTER — Other Ambulatory Visit: Payer: Self-pay | Admitting: Internal Medicine

## 2022-08-03 DIAGNOSIS — M6281 Muscle weakness (generalized): Secondary | ICD-10-CM | POA: Diagnosis not present

## 2022-08-03 DIAGNOSIS — E782 Mixed hyperlipidemia: Secondary | ICD-10-CM

## 2022-08-03 DIAGNOSIS — Z96611 Presence of right artificial shoulder joint: Secondary | ICD-10-CM | POA: Diagnosis not present

## 2022-08-03 LAB — COMPLETE METABOLIC PANEL WITH GFR
AG Ratio: 2.1 (calc) (ref 1.0–2.5)
ALT: 21 U/L (ref 9–46)
AST: 17 U/L (ref 10–35)
Albumin: 4.5 g/dL (ref 3.6–5.1)
Alkaline phosphatase (APISO): 82 U/L (ref 35–144)
BUN: 14 mg/dL (ref 7–25)
CO2: 25 mmol/L (ref 20–32)
Calcium: 9.6 mg/dL (ref 8.6–10.3)
Chloride: 105 mmol/L (ref 98–110)
Creat: 0.7 mg/dL (ref 0.70–1.28)
Globulin: 2.1 g/dL (calc) (ref 1.9–3.7)
Glucose, Bld: 121 mg/dL — ABNORMAL HIGH (ref 65–99)
Potassium: 3.9 mmol/L (ref 3.5–5.3)
Sodium: 141 mmol/L (ref 135–146)
Total Bilirubin: 0.5 mg/dL (ref 0.2–1.2)
Total Protein: 6.6 g/dL (ref 6.1–8.1)
eGFR: 95 mL/min/{1.73_m2} (ref >=60)

## 2022-08-03 LAB — MICROALBUMIN / CREATININE URINE RATIO
Creatinine, Urine: 211 mg/dL (ref 20–320)
Microalb Creat Ratio: 80 mcg/mg creat — ABNORMAL HIGH (ref <30)
Microalb, Ur: 16.9 mg/dL

## 2022-08-03 LAB — CBC WITH DIFFERENTIAL/PLATELET
Absolute Monocytes: 562 cells/uL (ref 200–950)
Basophils Absolute: 68 cells/uL (ref 0–200)
Basophils Relative: 0.9 %
Eosinophils Absolute: 251 cells/uL (ref 15–500)
Eosinophils Relative: 3.3 %
HCT: 47.7 % (ref 38.5–50.0)
Hemoglobin: 16.1 g/dL (ref 13.2–17.1)
Lymphs Abs: 1642 cells/uL (ref 850–3900)
MCH: 31.6 pg (ref 27.0–33.0)
MCHC: 33.8 g/dL (ref 32.0–36.0)
MCV: 93.7 fL (ref 80.0–100.0)
MPV: 9.8 fL (ref 7.5–12.5)
Monocytes Relative: 7.4 %
Neutro Abs: 5077 cells/uL (ref 1500–7800)
Neutrophils Relative %: 66.8 %
Platelets: 207 10*3/uL (ref 140–400)
RBC: 5.09 10*6/uL (ref 4.20–5.80)
RDW: 13.4 % (ref 11.0–15.0)
Total Lymphocyte: 21.6 %
WBC: 7.6 10*3/uL (ref 3.8–10.8)

## 2022-08-03 LAB — URINALYSIS, ROUTINE W REFLEX MICROSCOPIC
Bacteria, UA: NONE SEEN /HPF
Bilirubin Urine: NEGATIVE
Glucose, UA: NEGATIVE
Hgb urine dipstick: NEGATIVE
Nitrite: NEGATIVE
Specific Gravity, Urine: 1.025 (ref 1.001–1.035)
pH: <=5 (ref 5.0–8.0)

## 2022-08-03 LAB — MAGNESIUM: Magnesium: 1.8 mg/dL (ref 1.5–2.5)

## 2022-08-03 LAB — HEMOGLOBIN A1C
Hgb A1c MFr Bld: 5.6 % of total Hgb (ref <5.7)
Mean Plasma Glucose: 114 mg/dL
eAG (mmol/L): 6.3 mmol/L

## 2022-08-03 LAB — LIPID PANEL
Cholesterol: 213 mg/dL — ABNORMAL HIGH (ref <200)
HDL: 54 mg/dL (ref >=40)
LDL Cholesterol (Calc): 125 mg/dL (calc) — ABNORMAL HIGH
Non-HDL Cholesterol (Calc): 159 mg/dL (calc) — ABNORMAL HIGH (ref <130)
Total CHOL/HDL Ratio: 3.9 (calc) (ref <5.0)
Triglycerides: 224 mg/dL — ABNORMAL HIGH (ref <150)

## 2022-08-03 LAB — TSH: TSH: 0.99 mIU/L (ref 0.40–4.50)

## 2022-08-03 LAB — PSA: PSA: 0.17 ng/mL (ref <=4.00)

## 2022-08-03 LAB — VITAMIN D 25 HYDROXY (VIT D DEFICIENCY, FRACTURES): Vit D, 25-Hydroxy: 35 ng/mL (ref 30–100)

## 2022-08-03 LAB — MICROSCOPIC MESSAGE

## 2022-08-03 LAB — INSULIN, RANDOM: Insulin: 111.3 u[IU]/mL — ABNORMAL HIGH

## 2022-08-03 MED ORDER — EZETIMIBE 10 MG PO TABS: ORAL_TABLET | ORAL | 3 refills | Status: DC

## 2022-08-03 NOTE — Progress Notes (Signed)
<><><><><><><><><><><><><><><><><><><><><><><><><><><><><><><><><> <><><><><><><><><><><><><><><><><><><><><><><><><><><><><><><><><> - Test results slightly outside the reference range are not unusual. If there is anything important, I will review this with you,  otherwise it is considered normal test values.  If you have further questions,  please do not hesitate to contact me at the office or via My Chart.  <><><><><><><><><><><><><><><><><><><><><><><><><><><><><><><><><> <><><><><><><><><><><><><><><><><><><><><><><><><><><><><><><><><>   - Total Chol = 213  is high risk for Heart Attack /Stroke /Vascular Dementia     ( Ideal or Goal is less than 180 ! )  & - Bad /Dangerous LDL Chol =  125   - - >> Sitting on a time Bomb !     ( Ideal or Goal is less than 70 ! )   - Treating with meds to lower Cholesterol is treating the result                                          & NOT treating the cause  - The cause is Bad Diet !    ~ ~ ~ >> So you MUST get on a Better Diet  !   - But need to go ahead & increase meds until get on a better diet to try &                                                                                      reverse some of the Damage already done   - So Please Increase your Rosuvastatin to 1 whole tablet  EVERY day  Also adding a 2sd medicine =    Zetia  / Ezetimibe to take with the Rosuvastatin   - Read or listen to   Dr Wyman Songster 's book    " How Not to Die ! "    - Recommend a stricter plant based low cholesterol diet   - Cholesterol only comes from animal sources                                                                  - ie. meat, dairy, egg yolks  - Eat all the vegetables you want.                                      - Avoid Meat, Avoid Meat , Avoid Meat  ! ! !                                                                                                      -  especially red meat - Beef AND Pork  !  - Avoid cheese & dairy - milk & ice cream.   - Cheese is the most concentrated form of trans-fats which                                                                                                   is the worst thing to clog up our arteries.   - Veggie cheese is OK which can be found in                                              the fresh produce section at                                                          [employer] or [employer] or [employer]  <><><><><><><><><><><><><><><><><><><><><><><><><><><><><><><><><> <><><><><><><><><><><><><><><><><><><><><><><><><><><><><><><><><>  -   Also Triglycerides (  224   ) or fats in blood are too high                 (   Ideal or  Goal is less than 150  !  )    - Recommend avoid fried & greasy foods,  sweets / candy,   - Avoid white rice  (brown or wild rice or Quinoa is OK),   - Avoid white potatoes  (sweet potatoes are OK)   - Avoid anything made from white flour  - bagels, doughnuts, rolls, buns, biscuits, white and   wheat breads, pizza crust and traditional  pasta made of white flour & egg white  - (vegetarian pasta or spinach or wheat pasta is OK).    - Multi-grain bread is OK - like multi-grain flat bread or  sandwich thins.   - Avoid alcohol in excess.   - Exercise is also important.  <><><><><><><><><><><><><><><><><><><><><><><><><><><><><><><><><> <><><><><><><><><><><><><><><><><><><><><><><><><><><><><><><><><>  -  Insulin = 111.3  is elevated  ( Normal is less than 20  ) -  & shows insulin resistance   - a sign of early diabetes and associated with a 300 % greater risk for   heart attacks, strokes, cancer & Alzheimer type vascular dementia   - All this can be cured  and prevented with losing weight   - get Dr Fara Olden Fuhrman's book 'the End of Diabetes" and "the End of Dieting"   - and add many years of good health to your  life. <><><><><><><><><><><><><><><><><><><><><><><><><><><><><><><><><> <><><><><><><><><><><><><><><><><><><><><><><><><><><><><><><><><>  -  PSA   is Low  - Great ! <><><><><><><><><><><><><><><><><><><><><><><><><><><><><><><><><> <><><><><><><><><><><><><><><><><><><><><><><><><><><><><><><><><>   Magnesium = 1.8   is very  low- goal is betw 2.0 - 2.5,   >- So..............Marland Kitchen  Recommend that you take Magnesium 500 mg tablet 2  x / day   - also important to eat lots of  leafy green vegetables   -  spinach - Kale - collards - greens - okra - asparagus   - broccoli - quinoa - squash - almonds   - black, red, white beans-  peas - green beans <><><><><><><><><><><><><><><><><><><><><><><><><><><><><><><><><> <><><><><><><><><><><><><><><><><><><><><><><><><><><><><><><><><>  -  A1c  - Normal - No Diabetes  - Great !  <><><><><><><><><><><><><><><><><><><><><><><><><><><><><><><><><>  -  Vitamin D = 35  is Very Very Low  - Bad   - Vitamin D goal is between 70-100.    ??? Are   you take your recommended 10,000 units /day  ??? <><><><><><><><><><><><><><><><><><><><><><><><><><><><><><><><><>  - Please make sure that you are taking your Vitamin D as directed.   - It is very important as a natural anti-inflammatory and helping the  immune system protect against viral infections, like the Covid-19    helping hair, skin, and nails, as well as reducing stroke and  heart attack risk.   - It helps your bones and helps with mood.  - It also decreases numerous cancer risks so please  take it as directed.   - Low Vit D is associated with a 200-300% higher risk for  CANCER   and 200-300% higher risk for HEART   ATTACK  &  STROKE.    - It is also associated with higher death rate at younger ages,   autoimmune diseases like Rheumatoid arthritis, Lupus,  Multiple Sclerosis.     - Also many other serious conditions, like depression, Alzheimer's  Dementia, infertility, muscle  aches, fatigue, fibromyalgia   - just to name a few. <><><><><><><><><><><><><><><><><><><><><><><><><><><><><><><><><> <><><><><><><><><><><><><><><><><><><><><><><><><><><><><><><><><>  -  All Else - CBC - Kidneys - Electrolytes - Liver - Magnesium & Thyroid    - all  Normal / OK <><><><><><><><><><><><><><><><><><><><><><><><><><><><><><><><><> <><><><><><><><><><><><><><><><><><><><><><><><><><><><><><><><><> <><><><><><><><><><><><><><><><><><><><><><><><><><><><><><><><><>

## 2022-08-04 NOTE — Progress Notes (Signed)
<><><><><><><><><><><><><> <><><><><><><><><><><><><>  -   Results to Dr Tresa Moore  - PSA 0.13   <><><><><><><><><><><><><> <><><><><><><><><><><><><>

## 2022-08-07 ENCOUNTER — Other Ambulatory Visit: Payer: Self-pay | Admitting: Internal Medicine

## 2022-08-07 DIAGNOSIS — E782 Mixed hyperlipidemia: Secondary | ICD-10-CM

## 2022-08-07 MED ORDER — ROSUVASTATIN CALCIUM 20 MG PO TABS: ORAL_TABLET | ORAL | 3 refills | Status: AC

## 2022-08-09 DIAGNOSIS — Z96611 Presence of right artificial shoulder joint: Secondary | ICD-10-CM | POA: Diagnosis not present

## 2022-08-09 DIAGNOSIS — M6281 Muscle weakness (generalized): Secondary | ICD-10-CM | POA: Diagnosis not present

## 2022-08-12 DIAGNOSIS — H01004 Unspecified blepharitis left upper eyelid: Secondary | ICD-10-CM | POA: Diagnosis not present

## 2022-08-12 DIAGNOSIS — H53001 Unspecified amblyopia, right eye: Secondary | ICD-10-CM | POA: Diagnosis not present

## 2022-08-12 DIAGNOSIS — H01001 Unspecified blepharitis right upper eyelid: Secondary | ICD-10-CM | POA: Diagnosis not present

## 2022-08-12 DIAGNOSIS — H5202 Hypermetropia, left eye: Secondary | ICD-10-CM | POA: Diagnosis not present

## 2022-08-12 DIAGNOSIS — Z961 Presence of intraocular lens: Secondary | ICD-10-CM | POA: Diagnosis not present

## 2022-08-15 DIAGNOSIS — M47816 Spondylosis without myelopathy or radiculopathy, lumbar region: Secondary | ICD-10-CM | POA: Diagnosis not present

## 2022-08-18 ENCOUNTER — Encounter (HOSPITAL_COMMUNITY): Payer: Self-pay | Admitting: *Deleted

## 2022-08-30 DIAGNOSIS — R310 Gross hematuria: Secondary | ICD-10-CM | POA: Diagnosis not present

## 2022-08-30 DIAGNOSIS — N5201 Erectile dysfunction due to arterial insufficiency: Secondary | ICD-10-CM | POA: Diagnosis not present

## 2022-08-30 DIAGNOSIS — R3915 Urgency of urination: Secondary | ICD-10-CM | POA: Diagnosis not present

## 2022-09-13 ENCOUNTER — Ambulatory Visit (INDEPENDENT_AMBULATORY_CARE_PROVIDER_SITE_OTHER): Payer: Medicare Other | Admitting: Podiatry

## 2022-09-13 ENCOUNTER — Ambulatory Visit (INDEPENDENT_AMBULATORY_CARE_PROVIDER_SITE_OTHER): Payer: Medicare Other

## 2022-09-13 DIAGNOSIS — L821 Other seborrheic keratosis: Secondary | ICD-10-CM | POA: Diagnosis not present

## 2022-09-13 DIAGNOSIS — M7672 Peroneal tendinitis, left leg: Secondary | ICD-10-CM

## 2022-09-13 DIAGNOSIS — L812 Freckles: Secondary | ICD-10-CM | POA: Diagnosis not present

## 2022-09-13 DIAGNOSIS — L218 Other seborrheic dermatitis: Secondary | ICD-10-CM | POA: Diagnosis not present

## 2022-09-13 DIAGNOSIS — M7752 Other enthesopathy of left foot: Secondary | ICD-10-CM

## 2022-09-13 DIAGNOSIS — D1801 Hemangioma of skin and subcutaneous tissue: Secondary | ICD-10-CM | POA: Diagnosis not present

## 2022-09-13 DIAGNOSIS — L570 Actinic keratosis: Secondary | ICD-10-CM | POA: Diagnosis not present

## 2022-09-13 DIAGNOSIS — L308 Other specified dermatitis: Secondary | ICD-10-CM | POA: Diagnosis not present

## 2022-09-13 MED ORDER — METHYLPREDNISOLONE 4 MG PO TBPK: ORAL_TABLET | ORAL | 0 refills | Status: DC

## 2022-09-13 MED ORDER — MELOXICAM 15 MG PO TABS: 15.0000 mg | ORAL_TABLET | Freq: Every day | ORAL | 3 refills | Status: DC

## 2022-09-13 MED ORDER — TRIAMCINOLONE ACETONIDE 40 MG/ML IJ SUSP
20.0000 mg | Freq: Once | INTRAMUSCULAR | Status: AC
  Administered 2022-09-13: 20 mg

## 2022-09-13 NOTE — Progress Notes (Signed)
He presents today chief complaint of pain to his lateral ankle left states this been going on for about 10 weeks now he has tried acupuncture and nothing seems to be getting better.  He does not recall injuring this foot at this time.  Objective: Vital signs are stable he is alert and oriented x 3.  There is no erythema just moderate edema no cellulitis drainage or odor he has severe pain on plantarflexion and inversion of the foot as well as abduction against resistance.  Radiographs taken today demonstrate some calcification of the arteries in the posterior aspect of the ankle also demonstrates some soft tissue swelling in the posterior and lateral ankle as well as some early osteoarthritic changes of the anterior and posterior ankle and subtalar joint area.  Assessment: Probable peroneal tendinitis left.  Plan: At this point I injected the area not injecting into the tendons themselves with 10 mg of Kenalog 5 mg Marcaine.  Placed him in a cam boot and started him on anti-inflammatory consisting of methylprednisolone.  I would like to follow-up with him in about 1 month to 6 weeks.  Should he have questions or concerns he will notify us immediately.  If not improved may need to consider MRI

## 2022-09-13 NOTE — Patient Instructions (Signed)
Peroneal tenonitis

## 2022-09-14 DIAGNOSIS — M47816 Spondylosis without myelopathy or radiculopathy, lumbar region: Secondary | ICD-10-CM | POA: Diagnosis not present

## 2022-09-16 ENCOUNTER — Other Ambulatory Visit: Payer: Self-pay | Admitting: Internal Medicine

## 2022-09-16 DIAGNOSIS — I10 Essential (primary) hypertension: Secondary | ICD-10-CM

## 2022-09-16 DIAGNOSIS — I480 Paroxysmal atrial fibrillation: Secondary | ICD-10-CM

## 2022-09-16 MED ORDER — VERAPAMIL HCL ER 120 MG PO TBCR: EXTENDED_RELEASE_TABLET | ORAL | 3 refills | Status: DC

## 2022-09-28 DIAGNOSIS — M47816 Spondylosis without myelopathy or radiculopathy, lumbar region: Secondary | ICD-10-CM | POA: Diagnosis not present

## 2022-09-29 DIAGNOSIS — M25511 Pain in right shoulder: Secondary | ICD-10-CM | POA: Diagnosis not present

## 2022-10-03 ENCOUNTER — Other Ambulatory Visit: Payer: Self-pay | Admitting: Nurse Practitioner

## 2022-10-03 ENCOUNTER — Other Ambulatory Visit: Payer: Self-pay

## 2022-10-03 MED ORDER — CARVEDILOL 6.25 MG PO TABS: 6.2500 mg | ORAL_TABLET | Freq: Two times a day (BID) | ORAL | 2 refills | Status: AC

## 2022-10-12 ENCOUNTER — Ambulatory Visit: Payer: Medicare Other | Admitting: Nurse Practitioner

## 2022-10-17 ENCOUNTER — Other Ambulatory Visit: Payer: Self-pay | Admitting: Internal Medicine

## 2022-10-17 DIAGNOSIS — G4700 Insomnia, unspecified: Secondary | ICD-10-CM

## 2022-10-17 MED ORDER — HYDROXYZINE HCL 25 MG PO TABS: ORAL_TABLET | ORAL | 3 refills | Status: DC

## 2022-10-25 ENCOUNTER — Ambulatory Visit: Payer: Medicare Other | Admitting: Podiatry

## 2022-11-14 ENCOUNTER — Ambulatory Visit: Payer: Medicare Other | Admitting: Nurse Practitioner

## 2022-11-14 ENCOUNTER — Other Ambulatory Visit: Payer: Self-pay | Admitting: Nurse Practitioner

## 2022-11-16 ENCOUNTER — Other Ambulatory Visit: Payer: Self-pay | Admitting: Internal Medicine

## 2022-11-17 ENCOUNTER — Ambulatory Visit: Payer: Medicare Other | Admitting: Nurse Practitioner

## 2022-11-21 ENCOUNTER — Other Ambulatory Visit: Payer: Self-pay

## 2022-11-21 MED ORDER — VALSARTAN 320 MG PO TABS: ORAL_TABLET | ORAL | 3 refills | Status: DC

## 2022-11-23 ENCOUNTER — Encounter: Payer: Self-pay | Admitting: Cardiovascular Disease

## 2022-11-23 ENCOUNTER — Ambulatory Visit: Payer: Medicare Other | Attending: Cardiovascular Disease | Admitting: Cardiovascular Disease

## 2022-11-23 VITALS — BP 138/72 | HR 60 | Ht 72.0 in | Wt 248.0 lb

## 2022-11-23 DIAGNOSIS — M47816 Spondylosis without myelopathy or radiculopathy, lumbar region: Secondary | ICD-10-CM | POA: Diagnosis not present

## 2022-11-23 DIAGNOSIS — I480 Paroxysmal atrial fibrillation: Secondary | ICD-10-CM | POA: Diagnosis not present

## 2022-11-23 NOTE — Progress Notes (Signed)
Electrophysiology Office Note:    Date:  11/23/2022   ID:  [name], DOB 04-26-1946, MRN [id number]  PCP:  [name], MD   [facility] Providers Cardiologist:  Hillis Range, MD (Inactive) Electrophysiologist:  Maurice Small, MD     Referring MD: [name], MD   History of Present Illness:    [name] is a 77 y.o. male with a hx listed below, significant for hypertension, hyperlipidemia, atrial fibrillation, referred for arrhythmia management.  He has a medtronic loop recorder placed in 2018 for atrial fibrillation. The device is now at EOS.  He has been maintained on flecainide with normal sinus rhythm.  He does noticed that he has had progressive fatigue and shortness of breath but no palpitations to indicate recurrence of atrial fibrillation.  Past Medical History:  Diagnosis Date   A-fib (HCC) 2018   on eliquis   Anxiety    Arthritis    Benign prostatic hyperplasia    BPH (benign prostatic hypertrophy)    Carpal tunnel syndrome, bilateral    Early cataract    GERD (gastroesophageal reflux disease)    History of SCC (squamous cell carcinoma) of skin 10/04/2016   HNP (herniated nucleus pulposus), cervical    Hyperlipidemia    Hypertension 2007   IBS (irritable bowel syndrome)    Obesity    Other abnormal glucose    Pneumonia    " walking"   Pre-diabetes    Vitamin D deficiency    Wears glasses     Past Surgical History:  Procedure Laterality Date   ANTERIOR CERVICAL DECOMP/DISCECTOMY FUSION  01-25-2002   C4 - C5   ANTERIOR CERVICAL DECOMP/DISCECTOMY FUSION Bilateral 07/16/2018   Procedure: REMOVAL OF ANTERIOR CERVICAL PLATE, ANTERIOR CERVICAL DECOMPRESSION/DISCECTOMY FUSION CERVICAL THREE- CERVICAL FOUR;  Surgeon: Shirlean Kelly, MD;  Location: [facility] OR;  Service: Neurosurgery;  Laterality: Bilateral;  REMOVAL OF ANTERIOR CERVICAL PLATE, ANTERIOR CERVICAL DECOMPRESSION/DISCECTOMY FUSION CERVICAL THREE- CERVICAL FOUR    ANTERIOR CRUCIATE LIGAMENT REPAIR  1990   RIGHT   APPENDECTOMY  02-07-2007   CARPAL TUNNEL RELEASE Right 07/16/2018   Procedure: CARPAL TUNNEL RELEASE;  Surgeon: Shirlean Kelly, MD;  [facility];  Service: Neurosurgery;  Laterality: Right;  CARPAL TUNNEL RELEASE   CATARACT EXTRACTION Right 01/2020   CERVICAL FUSION  2000   C5 - 6   COLONOSCOPY  05-30-2011   HEMORRHOIDS   HERNIA REPAIR     HIP SURGERY  1999   LEFT HIP --  REMOVAL CALCIUM BUILD-UP   LEFT ANKLE SURG.  1980   LEFT INGUINAL HERNIA REPAIR  1970   LEFT ROTATOR CUFF REPAIR  2002   LOOP RECORDER INSERTION N/A 09/19/2016   Procedure: Loop Recorder Insertion;  Surgeon: Hillis Range, MD;  Location: [facility] INVASIVE [facility];  Service: Cardiovascular;  Laterality: N/A;   REVERSE SHOULDER ARTHROPLASTY Right 01/06/2022   Procedure: REVERSE SHOULDER ARTHROPLASTY;  Surgeon: Bjorn Pippin, MD;  Location: [facility];  Service: Orthopedics;  Laterality: Right;   TENDON RECONSTRUCTION Left 07/17/2013   Procedure: LEFT ELBOW LATERAL RECONSTRUCTION;  Surgeon: Marlowe Shores, MD;  Location: [facility];  Service: Orthopedics;  Laterality: Left;   TONSILLECTOMY     TRIGGER FINGER RELEASE Right 07/17/2013   Procedure: RELEASE A-1 PULLEY RIGHT RING FINGER;  Surgeon: Marlowe Shores, MD;  Location: [facility];  Service: Orthopedics;  Laterality: Right;   UMBILICAL HERNIA REPAIR  2003   XI ROBOTIC ASSISTED SIMPLE PROSTATECTOMY  N/A 05/22/2019   Procedure: XI ROBOTIC ASSISTED SIMPLE PROSTATECTOMY;  Surgeon: Sebastian Ache, MD;  Location: [facility]S;  Service: Urology;  Laterality: N/A;  3 HRS    Current Medications: Current Meds  Medication Sig   carvedilol (COREG) 6.25 MG tablet Take 1 tablet (6.25 mg total) by mouth 2 (two) times daily.   Cholecalciferol (VITAMIN D3) 5000 units CAPS Take 10,000 Units by mouth daily.    ELIQUIS 5 MG TABS tablet Take 5 mg by mouth 2 (two) times daily.   escitalopram  (LEXAPRO) 20 MG tablet Take 1/2 tablet (10 mg) daily. (Patient taking differently: Take 10 mg by mouth at bedtime.)   ezetimibe (ZETIA) 10 MG tablet Take  1 tablet   Daily  for Cholesterol   finasteride (PROSCAR) 5 MG tablet Take 5 mg by mouth daily.   Flaxseed, Linseed, (FLAX SEED OIL PO) Take 1 capsule by mouth daily.   flecainide (TAMBOCOR) 100 MG tablet TAKE 1 TABLET BY MOUTH TWICE  DAILY   hydrOXYzine (ATARAX) 25 MG tablet Take 1 to 2 tablets at Bedtime as needed for Sleep   losartan (COZAAR) 50 MG tablet Take 50 mg by mouth daily.   Multiple Vitamin (MULTIVITAMIN) tablet Take 1 tablet by mouth daily.   Omega-3 Fatty Acids (FISH OIL PO) Take 1 capsule by mouth 2 (two) times daily.   rosuvastatin (CRESTOR) 20 MG tablet Take  1 whole  tablet (20 mg)  Daily for Cholesterol   verapamil (CALAN-SR) 120 MG CR tablet Take  1 tablet  Daily  with Food  for BP & Heart Rhythm   vitamin C (ASCORBIC ACID) 500 MG tablet Take 500 mg by mouth daily.   zinc gluconate 50 MG tablet Take 50 mg by mouth daily.   [DISCONTINUED] meloxicam (MOBIC) 15 MG tablet Take 1 tablet (15 mg total) by mouth daily.   [DISCONTINUED] valsartan (DIOVAN) 320 MG tablet Take  1 tablet  Daily for BP     Allergies:   Xarelto [rivaroxaban], Imdur [isosorbide nitrate], Atorvastatin, Clonazepam, and Tamsulosin hcl   Social and Family History: Reviewed in Epic  ROS:   Please see the history of present illness.    All other systems reviewed and are negative.  EKGs/Labs/Other Studies Reviewed Today:    Echocardiogram:  ordered   Monitors:   Stress testing:   Advanced imaging:   Cardiac catherization   EKG:  Last EKG results: today -sinus rhythm, 60 bpm    Recent Labs: 08/02/2022: ALT 21; BUN 14; Creat 0.70; Hemoglobin 16.1; Magnesium 1.8; Platelets 207; Potassium 3.9; Sodium 141; TSH 0.99     Physical Exam:    VS:  BP 138/72   Pulse 60   Ht 6' (1.829 m)   Wt 248 lb (112.5 kg)   SpO2 96%   BMI 33.63  kg/m     Wt Readings from Last 3 Encounters:  11/23/22 248 lb (112.5 kg)  08/02/22 241 lb 9.6 oz (109.6 kg)  05/31/22 236 lb 9.6 oz (107.3 kg)     GEN:  Well nourished, well developed in no acute distress CARDIAC: RRR, no murmurs, rubs, gallops RESPIRATORY:  Normal work of breathing MUSCULOSKELETAL: no edema    ASSESSMENT & PLAN:    Paroxysmal atrial fibrillation and flutter Remains in sinus rhythm on flecainide Continue carvedilo 6.25 mg  Secondary hypercoagulable state Continue eliquis 5mg  PO BID Dc mobic due to increased risk of bleeding  Hypertension DC valsartan -- he is on losartan as well  Fatigue and murmur  Will order TTE       Medication Adjustments/Labs and Tests Ordered: Current medicines are reviewed at length with the patient today.  Concerns regarding medicines are outlined above.  Orders Placed This Encounter  Procedures   EKG 12-Lead   ECHOCARDIOGRAM COMPLETE   No orders of the defined types were placed in this encounter.    Signed, Maurice Small, MD  11/23/2022 2:24 PM    [facility]

## 2022-11-23 NOTE — Patient Instructions (Signed)
Medication Instructions:  STOP Meloxicam STOP Valsartan *If you need a refill on your cardiac medications before your next appointment, please call your pharmacy*   Testing/Procedures: Echocardiogram Your physician has requested that you have an echocardiogram. Echocardiography is a painless test that uses sound waves to create images of your heart. It provides your doctor with information about the size and shape of your heart and how well your heart's chambers and valves are working. This procedure takes approximately one hour. There are no restrictions for this procedure. Please do NOT wear cologne, perfume, aftershave, or lotions (deodorant is allowed). Please arrive 15 minutes prior to your appointment time.   Follow-Up: At [facility], you and your health needs are our priority.  As part of our continuing mission to provide you with exceptional heart care, we have created designated Provider Care Teams.  These Care Teams include your primary Cardiologist (physician) and Advanced Practice Providers (APPs -  Physician Assistants and Nurse Practitioners) who all work together to provide you with the care you need, when you need it.  We recommend signing up for the patient portal called "MyChart".  Sign up information is provided on this After Visit Summary.  MyChart is used to connect with patients for Virtual Visits (Telemedicine).  Patients are able to view lab/test results, encounter notes, upcoming appointments, etc.  Non-urgent messages can be sent to your provider as well.   To learn more about what you can do with MyChart, go to [email or web address].    Your next appointment:   6 month(s)  Provider:   You will see one of the following Advanced Practice Providers on your designated Care Team:   Francis Dowse, [place] "[facility]" [place], [place]

## 2022-11-24 NOTE — Progress Notes (Signed)
MEDICARE ANNUAL WELLNESS VISIT AND FOLLOW UP  Assessment:   Encounter for annual wellness medicare visit Yearly  Essential hypertension - continue medications, DASH diet, exercise and monitor at home. Call if greater than 130/80.  -     CBC with Differential/Platelet -     CMP/GFR  Paroxysmal atrial fibrillation (HCC) NSR at this time, following Dr. Johney Frame, continue elequis 5mg  BID  Mixed hyperlipidemia Continue medications: rosuvastatin20mg  Discussed dietary and exercise modifications Low fat diet -     Lipid panel   Anxiety CONTINUE Lexapro 20 mg QD  Heart Murmur Echo scheduled through cardiology Continue current meds and follow with cardiology  Insomnia He is off Xanax and decrease Hydroxyzine to 10 mg 1-2 tabs at bedtime  Aortic atherosclerosis (HCC) Per CT 10/11/17 Control blood pressure, cholesterol, glucose, increase exercise.  Other abnormal glucose Discussed general issues about diabetes pathophysiology and management., Educational material distributed., Suggested low cholesterol diet., Encouraged aerobic exercise., Discussed foot care., Reminded to get yearly retinal exam.  Vitamin D deficiency Continue supplement  Benign prostatic hyperplasia with urinary frequency Continue follow up urology - [name]  Irritable bowel syndrome, unspecified type Diet discussed  Obesity BMI - long discussion about weight loss, diet, and exercise  History of SCC (squamous cell carcinoma) of skin Follow up DERM   Medication management -     Magnesium  Colon Cancer Screening - Cologuard was ordered 07/2022 by Dr Oneta Rack     Further disposition pending results if labs check today. Discussed med's effects and SE's.   Over 30 minutes of face to face interview, exam, counseling, chart review, and critical decision making was performed.    Future Appointments  Date Time Provider Department Center  12/23/2022 12:50 PM [facility] ECHO 4 [id number]   02/16/2023 11:30 AM [name], MD [facility] None  05/22/2023 11:30 AM Adela Glimpse, NP [facility] None  08/22/2023 11:00 AM [name], MD [facility] None  11/21/2023 11:00 AM Adela Glimpse, NP [facility] None     Plan:   During the course of the visit the patient was educated and counseled about appropriate screening and preventive services including:   Pneumococcal vaccine  Influenza vaccine Td vaccine Screening electrocardiogram Colorectal cancer screening Diabetes screening Glaucoma screening Nutrition counseling    Subjective:  [name] is a 77 y.o. male who presents for Medicare Annual Wellness Visit and 3 month follow up for HTN, HLD, and vitamin D Def.   He was started on Lexapro 10 mg QD and Hydroxyzine 25 mg 1-2 tabs at bedtime for sleep, off Xanax. He has been taking only a half of the hydroxyzine and doing well but is not able to cut current dose in half so will decrease dosage to 10 mg  He has history of Afib He is on tambocor 100 mg BID, verapamil  120 mg QD, he did go back on the Eliquis 5 mg BID. Echocardiogram is scheduled for 12/23/22 for heart murmur found at cardiology 2 days ago.   Follows with [name] for BPH just had OV last week, no changes. Last saw 08/2022  He had 4 weeks of loose bowels so had not yet done cologuard yet.  Symptoms are starting to improve.   [name] , Left hip injection Wednesday and has noticed some improvement of facet joint. He is going to be getting a nerve block in the future.   Had right shoulder replacement 12/2021 and has limited ROM and strength still diminished.  Had to be totally  restricted in brace for 6 weeks and had to restart PT- since bone was not growing as needed.    BMI is Body mass index is 33.44 kg/m., he has not been working on diet.  Wt Readings from Last 3 Encounters:  11/25/22 246 lb 9.6 oz (111.9 kg)  11/23/22 248 lb (112.5 kg)  08/02/22 241 lb 9.6 oz (109.6 kg)   His blood  pressure has been controlled at home, currently on verapamil 120 mg QD, carvedilol 6.25 mg BID and losartan 50 mg QD. Had been taking Valsartan and Losartan by mistake and Valsartan was d/c by cardiology 2 days ago. Today their BP is BP: 100/60  BP Readings from Last 3 Encounters:  11/25/22 100/60  11/23/22 138/72  08/02/22 118/70  He does workout. He denies chest pain, shortness of breath, dizziness.    He is on cholesterol medication, on rosuvastatin 20 mg daily.  Zetia 10 mg QD was started 07/2022. His cholesterol is at goal. The cholesterol last visit was:   Lab Results  Component Value Date   CHOL 213 (H) 08/02/2022   HDL 54 08/02/2022   LDLCALC 125 (H) 08/02/2022   TRIG 224 (H) 08/02/2022   CHOLHDL 3.9 08/02/2022    Had elevated A1C in 2013, has done well with diet/exercise. Last A1C in the office was:  Lab Results  Component Value Date   HGBA1C 5.6 08/02/2022   Patient is on Vitamin D supplement.   Lab Results  Component Value Date   VD25OH 35 08/02/2022      Medication Review:   Current Outpatient Medications (Cardiovascular):    carvedilol (COREG) 6.25 MG tablet, Take 1 tablet (6.25 mg total) by mouth 2 (two) times daily.   ezetimibe (ZETIA) 10 MG tablet, Take  1 tablet   Daily  for Cholesterol   flecainide (TAMBOCOR) 100 MG tablet, TAKE 1 TABLET BY MOUTH TWICE  DAILY   rosuvastatin (CRESTOR) 20 MG tablet, Take  1 whole  tablet (20 mg)  Daily for Cholesterol   verapamil (CALAN-SR) 120 MG CR tablet, Take  1 tablet  Daily  with Food  for BP & Heart Rhythm   losartan (COZAAR) 50 MG tablet, Take 1 tablet (50 mg total) by mouth daily.    Current Outpatient Medications (Hematological):    ELIQUIS 5 MG TABS tablet, Take 5 mg by mouth 2 (two) times daily.  Current Outpatient Medications (Other):    Cholecalciferol (VITAMIN D3) 5000 units CAPS, Take 10,000 Units by mouth daily.    escitalopram (LEXAPRO) 20 MG tablet, Take 1/2 tablet (10 mg) daily. (Patient taking  differently: Take 10 mg by mouth at bedtime.)   finasteride (PROSCAR) 5 MG tablet, Take 5 mg by mouth daily.   Flaxseed, Linseed, (FLAX SEED OIL PO), Take 1 capsule by mouth daily.   hydrOXYzine (ATARAX) 10 MG tablet, 1-2 tabs at bedtime   Multiple Vitamin (MULTIVITAMIN) tablet, Take 1 tablet by mouth daily.   Omega-3 Fatty Acids (FISH OIL PO), Take 1 capsule by mouth 2 (two) times daily.   vitamin C (ASCORBIC ACID) 500 MG tablet, Take 500 mg by mouth daily.   zinc gluconate 50 MG tablet, Take 50 mg by mouth daily.  Current Problems (verified) Patient Active Problem List   Diagnosis Date Noted   BPH (benign prostatic hyperplasia) 05/22/2019   Insomnia 08/09/2018   HNP (herniated nucleus pulposus), cervical 07/16/2018   Aortic atherosclerosis (HCC) by Chest CT scan on 10/11/2017 10/11/2017   Arthritis of carpometacarpal [facility]) joint of  left thumb 07/27/2017   Chronic pain of left thumb 07/27/2017   History of SCC (squamous cell carcinoma) of skin 10/04/2016   Atrial fibrillation (HCC) 08/01/2016   Obesity (BMI 30.0-34.9) 09/30/2013   Essential hypertension    Hyperlipidemia, mixed    BPH with obstruction/lower urinary tract symptoms    Abnormal glucose    IBS (irritable bowel syndrome)    Vitamin D deficiency     Screening Tests Immunization History  Administered Date(s) Administered   DTaP 07/11/2005   Influenza Whole 04/02/2013   Influenza, High Dose Seasonal PF 05/01/2014, 05/21/2015, 03/29/2016, 05/31/2017, 05/05/2018, 05/01/2019, 04/19/2021, 04/19/2022   Moderna Sars-Covid-2 Vaccination 08/10/2019, 09/09/2019   Pneumococcal Conjugate-13 05/01/2014   Pneumococcal Polysaccharide-23 09/08/2008, 03/29/2016   Td 12/16/2015   Zoster, Live 12/09/2008   Health Maintenance  Topic Date Due   COVID-19 Vaccine (3 - Moderna risk series) 12/11/2022 (Originally 10/07/2019)   Zoster Vaccines- Shingrix (1 of 2) 02/25/2023 (Originally 01/28/1965)   INFLUENZA VACCINE  02/09/2023    Medicare Annual Wellness (AWV)  11/25/2023   DTaP/Tdap/Td (3 - Tdap) 12/15/2025   Pneumonia Vaccine 48+ Years old  Completed   Hepatitis C Screening  Completed   HPV VACCINES  Aged Out   Fecal DNA (Cologuard)  Discontinued     Names of Other Physician/Practitioners you currently use: 1. [facility] here for primary care 2. Dr. Burgess Estelle, eye doctor, visit yearly 08/2021 3. Dr. Domingo Dimes, dentist, q 6 months 2022  Patient Care Team: [name], MD as PCP - General (Internal Medicine) Hillis Range, MD (Inactive) as PCP - Cardiology (Cardiology) Mealor, Roberts Gaudy, MD as PCP - Electrophysiology (Cardiology) Dairl Ponder, MD as Consulting Physician (Orthopedic Surgery) Louis Meckel, MD (Inactive) as Consulting Physician (Gastroenterology) Berneice Heinrich Delbert Phenix., MD as Consulting Physician (Urology) Hillis Range, MD (Inactive) as Consulting Physician (Cardiology)   History reviewed: allergies, current medications, past family history, past medical history, past social history, past surgical history and problem list  Allergies Allergies  Allergen Reactions   Xarelto [Rivaroxaban] Other (See Comments)    Severe body aches   Imdur [Isosorbide Nitrate] Other (See Comments)    Severe Headaches   Atorvastatin Other (See Comments)    Myalgias   Clonazepam Other (See Comments)    "Made me jittery"   Tamsulosin Hcl Other (See Comments)    Hypotension    SURGICAL HISTORY He  has a past surgical history that includes Appendectomy (02-07-2007); Anterior cervical decomp/discectomy fusion (01-25-2002); LEFT INGUINAL HERNIA REPAIR (1970); Umbilical hernia repair (2003); LEFT ROTATOR CUFF REPAIR (2002); Anterior cruciate ligament repair (1990); Cervical fusion (2000); LEFT ANKLE SURG. (1980); Hip surgery (1999); Colonoscopy (05-30-2011); Tonsillectomy; Trigger finger release (Right, 07/17/2013); Tendon reconstruction (Left, 07/17/2013); LOOP RECORDER  INSERTION (N/A, 09/19/2016); Hernia repair; Anterior cervical decomp/discectomy fusion (Bilateral, 07/16/2018); Carpal tunnel release (Right, 07/16/2018); XI robotic assisted simple prostatectomy (N/A, 05/22/2019); Cataract extraction (Right, 01/2020); and Reverse shoulder arthroplasty (Right, 01/06/2022). FAMILY HISTORY His family history includes Alcohol abuse in his father; Diabetes in his father; Heart attack (age of onset: 69) in his father; Heart disease in his father and mother; Stroke (age of onset: 41) in his mother. SOCIAL HISTORY He  reports that he has never smoked. He has never used smokeless tobacco. He reports current alcohol use of about 4.0 standard drinks of alcohol per week. He reports that he does not use drugs.  MEDICARE WELLNESS OBJECTIVES: Physical activity: Current Exercise Habits: Home exercise routine, Time (Minutes): 30, Frequency (Times/Week): 2, Weekly  Exercise (Minutes/Week): 60, Intensity: Mild, Exercise limited by: orthopedic condition(s) Cardiac risk factors: Cardiac Risk Factors include: advanced age (>26men, >28 women);dyslipidemia;hypertension;male gender;family history of premature cardiovascular disease;obesity (BMI >30kg/m2) Depression/mood screen:      11/25/2022    9:54 AM  Depression screen PHQ 2/9  Decreased Interest 0  Down, Depressed, Hopeless 0  PHQ - 2 Score 0    ADLs:     11/25/2022    9:54 AM 08/02/2022   12:58 AM  In your present state of health, do you have any difficulty performing the following activities:  Hearing? 0 0  Vision? 0 0  Difficulty concentrating or making decisions? 0 0  Walking or climbing stairs? 1 0  Dressing or bathing? 0 0  Doing errands, shopping? 0 0     Cognitive Testing  Alert? Yes  Normal Appearance?Yes  Oriented to person? Yes  Place? Yes   Time? Yes  Recall of three objects?  Yes  Can perform simple calculations? Yes  Displays appropriate judgment?Yes  Can read the correct time from a watch face?Yes  EOL  planning: Does Patient Have a Medical Advance Directive?: Yes Type of Advance Directive: Healthcare Power of Attorney, Living will Does patient want to make changes to medical advance directive?: No - Patient declined Copy of Healthcare Power of Attorney in Chart?: No - copy requested   Objective:   Blood pressure 100/60, pulse 64, temperature 97.9 F (36.6 C), resp. rate 16, height 6' (1.829 m), weight 246 lb 9.6 oz (111.9 kg), SpO2 98 %. Body mass index is 33.44 kg/m.  General appearance: alert, no distress, WD/WN, male HEENT: normocephalic, sclerae anicteric, TMs pearly, nares patent, no discharge or erythema, pharynx normal Oral cavity: MMM, no lesions Neck: supple, no lymphadenopathy, no thyromegaly, no masses Heart: RRR, normal S1, S2, no murmurs Lungs: CTA bilaterally, no wheezes, rhonchi, or rales Abdomen: +bs, soft, non tender, non distended, no masses, no hepatomegaly, no splenomegaly Musculoskeletal: nontender, right knee with obvious bony enlargement no effusion, no erythema. Extremities: no edema, no cyanosis, no clubbing Pulses: 2+ symmetric, upper and lower extremities, normal cap refill Neurological: alert, oriented x 3, CN2-12 intact, strength normal upper extremities and lower extremities, sensation normal throughout, DTRs 2+ throughout, no cerebellar signs, gait antalgic Skin: warm and dry , ecchymoses of arms Psychiatric: normal affect, behavior normal, pleasant    Medicare Attestation I have personally reviewed: The patient's medical and social history Their use of alcohol, tobacco or illicit drugs Their current medications and supplements The patient's functional ability including ADLs,fall risks, home safety risks, cognitive, and hearing and visual impairment Diet and physical activities Evidence for depression or mood disorders  The patient's weight, height, BMI, and visual acuity have been recorded in the chart.  I have made referrals, counseling, and  provided education to the patient based on review of the above and I have provided the patient with a written personalized care plan for preventive services.     Raynelle Dick, NP   11/25/2022

## 2022-11-25 ENCOUNTER — Ambulatory Visit (INDEPENDENT_AMBULATORY_CARE_PROVIDER_SITE_OTHER): Payer: Medicare Other | Admitting: Nurse Practitioner

## 2022-11-25 ENCOUNTER — Encounter: Payer: Self-pay | Admitting: Nurse Practitioner

## 2022-11-25 VITALS — BP 100/60 | HR 64 | Temp 97.9°F | Resp 16 | Ht 72.0 in | Wt 246.6 lb

## 2022-11-25 DIAGNOSIS — E669 Obesity, unspecified: Secondary | ICD-10-CM

## 2022-11-25 DIAGNOSIS — Z79899 Other long term (current) drug therapy: Secondary | ICD-10-CM | POA: Diagnosis not present

## 2022-11-25 DIAGNOSIS — K589 Irritable bowel syndrome without diarrhea: Secondary | ICD-10-CM

## 2022-11-25 DIAGNOSIS — Z85828 Personal history of other malignant neoplasm of skin: Secondary | ICD-10-CM | POA: Diagnosis not present

## 2022-11-25 DIAGNOSIS — N401 Enlarged prostate with lower urinary tract symptoms: Secondary | ICD-10-CM | POA: Diagnosis not present

## 2022-11-25 DIAGNOSIS — I480 Paroxysmal atrial fibrillation: Secondary | ICD-10-CM | POA: Diagnosis not present

## 2022-11-25 DIAGNOSIS — E782 Mixed hyperlipidemia: Secondary | ICD-10-CM

## 2022-11-25 DIAGNOSIS — I10 Essential (primary) hypertension: Secondary | ICD-10-CM | POA: Diagnosis not present

## 2022-11-25 DIAGNOSIS — E559 Vitamin D deficiency, unspecified: Secondary | ICD-10-CM | POA: Diagnosis not present

## 2022-11-25 DIAGNOSIS — Z0001 Encounter for general adult medical examination with abnormal findings: Secondary | ICD-10-CM | POA: Diagnosis not present

## 2022-11-25 DIAGNOSIS — I700 Atherosclerosis of aorta: Secondary | ICD-10-CM | POA: Diagnosis not present

## 2022-11-25 DIAGNOSIS — R6889 Other general symptoms and signs: Secondary | ICD-10-CM | POA: Diagnosis not present

## 2022-11-25 DIAGNOSIS — G4700 Insomnia, unspecified: Secondary | ICD-10-CM | POA: Diagnosis not present

## 2022-11-25 DIAGNOSIS — R7309 Other abnormal glucose: Secondary | ICD-10-CM

## 2022-11-25 DIAGNOSIS — E66811 Obesity, class 1: Secondary | ICD-10-CM

## 2022-11-25 DIAGNOSIS — Z0000 Encounter for general adult medical examination without abnormal findings: Secondary | ICD-10-CM

## 2022-11-25 LAB — CBC WITH DIFFERENTIAL/PLATELET
Absolute Monocytes: 563 cells/uL (ref 200–950)
Basophils Absolute: 58 cells/uL (ref 0–200)
MPV: 10 fL (ref 7.5–12.5)
Monocytes Relative: 8.8 %
Neutrophils Relative %: 63.1 %
Platelets: 219 10*3/uL (ref 140–400)
WBC: 6.4 10*3/uL (ref 3.8–10.8)

## 2022-11-25 MED ORDER — HYDROXYZINE HCL 10 MG PO TABS
ORAL_TABLET | ORAL | 2 refills | Status: DC
Start: 2022-11-25 — End: 2023-10-19

## 2022-11-25 MED ORDER — LOSARTAN POTASSIUM 50 MG PO TABS
50.0000 mg | ORAL_TABLET | Freq: Every day | ORAL | 2 refills | Status: AC
Start: 2022-11-25 — End: ?

## 2022-11-25 NOTE — Patient Instructions (Signed)

## 2022-11-26 LAB — COMPLETE METABOLIC PANEL WITH GFR
AG Ratio: 2.3 (calc) (ref 1.0–2.5)
ALT: 36 U/L (ref 9–46)
AST: 29 U/L (ref 10–35)
Albumin: 4.3 g/dL (ref 3.6–5.1)
Alkaline phosphatase (APISO): 76 U/L (ref 35–144)
BUN: 15 mg/dL (ref 7–25)
CO2: 25 mmol/L (ref 20–32)
Calcium: 9.1 mg/dL (ref 8.6–10.3)
Chloride: 107 mmol/L (ref 98–110)
Creat: 0.81 mg/dL (ref 0.70–1.28)
Globulin: 1.9 g/dL (calc) (ref 1.9–3.7)
Glucose, Bld: 108 mg/dL — ABNORMAL HIGH (ref 65–99)
Potassium: 3.9 mmol/L (ref 3.5–5.3)
Sodium: 141 mmol/L (ref 135–146)
Total Bilirubin: 0.5 mg/dL (ref 0.2–1.2)
Total Protein: 6.2 g/dL (ref 6.1–8.1)
eGFR: 91 mL/min/{1.73_m2} (ref >=60)

## 2022-11-26 LAB — CBC WITH DIFFERENTIAL/PLATELET
Basophils Relative: 0.9 %
Eosinophils Absolute: 269 cells/uL (ref 15–500)
Eosinophils Relative: 4.2 %
HCT: 45.1 % (ref 38.5–50.0)
Hemoglobin: 15.2 g/dL (ref 13.2–17.1)
Lymphs Abs: 1472 cells/uL (ref 850–3900)
MCH: 31.8 pg (ref 27.0–33.0)
MCHC: 33.7 g/dL (ref 32.0–36.0)
MCV: 94.4 fL (ref 80.0–100.0)
Neutro Abs: 4038 cells/uL (ref 1500–7800)
RBC: 4.78 10*6/uL (ref 4.20–5.80)
RDW: 13 % (ref 11.0–15.0)
Total Lymphocyte: 23 %

## 2022-11-26 LAB — LIPID PANEL
Cholesterol: 100 mg/dL (ref <200)
HDL: 43 mg/dL (ref >=40)
LDL Cholesterol (Calc): 40 mg/dL (calc)
Non-HDL Cholesterol (Calc): 57 mg/dL (calc) (ref <130)
Total CHOL/HDL Ratio: 2.3 (calc) (ref <5.0)
Triglycerides: 86 mg/dL (ref <150)

## 2022-11-26 LAB — VITAMIN D 25 HYDROXY (VIT D DEFICIENCY, FRACTURES): Vit D, 25-Hydroxy: 61 ng/mL (ref 30–100)

## 2022-11-26 LAB — MAGNESIUM: Magnesium: 2 mg/dL (ref 1.5–2.5)

## 2022-12-21 ENCOUNTER — Ambulatory Visit (INDEPENDENT_AMBULATORY_CARE_PROVIDER_SITE_OTHER): Payer: Medicare Other | Admitting: Podiatry

## 2022-12-21 ENCOUNTER — Encounter: Payer: Self-pay | Admitting: Podiatry

## 2022-12-21 DIAGNOSIS — M7672 Peroneal tendinitis, left leg: Secondary | ICD-10-CM

## 2022-12-21 MED ORDER — TRIAMCINOLONE ACETONIDE 10 MG/ML IJ SUSP
10.0000 mg | Freq: Once | INTRAMUSCULAR | Status: AC
Start: 2022-12-21 — End: 2022-12-21
  Administered 2022-12-21: 10 mg

## 2022-12-22 NOTE — Progress Notes (Signed)
Subjective:   Patient ID: [name], male   DOB: 77 y.o.   MRN: [id number]   HPI Patient presents left foot stating that he was doing well but he is developed pain again with activity and he like to try to do 1 more steroid injection if it does not get better he knows he will need MRI cannot rule out other pathology   ROS      Objective:  Physical Exam  Neuro vascular status intact with patient found to have inflammation of the lateral peroneal group left that was treated by Dr. Al Corpus approximately 3-1/2 months ago     Assessment:  Peritoneal inflammation left that does not appear to be torn at this time with good strength but may have gotten strained again     Plan:  Injection discussed and I explained risk of rupture associated with this and patient understands that fully and wants to proceed.  I did do sterile prep I injected along the sheath not directly into the tendon 3 mg Dexasone Kenalog 5 mg Xylocaine lateral advised him to wear his boot and if symptoms persist he is to see Dr. Al Corpus for MRI and the consideration of interstitial tear of the tendon

## 2022-12-23 ENCOUNTER — Ambulatory Visit (HOSPITAL_COMMUNITY): Payer: Medicare Other | Attending: Cardiovascular Disease

## 2022-12-23 DIAGNOSIS — I480 Paroxysmal atrial fibrillation: Secondary | ICD-10-CM | POA: Diagnosis not present

## 2022-12-23 LAB — ECHOCARDIOGRAM COMPLETE
AR max vel: 1.56 cm2
AV Area VTI: 1.58 cm2
AV Area mean vel: 1.57 cm2
AV Mean grad: 12 mmHg
AV Peak grad: 21.2 mmHg
Ao pk vel: 2.3 m/s
Area-P 1/2: 3.91 cm2
Calc EF: 60.8 %
S' Lateral: 2.8 cm
Single Plane A2C EF: 59.2 %
Single Plane A4C EF: 61.7 %

## 2022-12-28 ENCOUNTER — Telehealth: Payer: Self-pay | Admitting: Cardiovascular Disease

## 2022-12-28 NOTE — Telephone Encounter (Signed)
Patient called to follow-up on his test results. 

## 2022-12-28 NOTE — Telephone Encounter (Signed)
Johnathan Small, MD 12/26/2022  9:07 PM EDT     Heart appears largely normal in structure and function. Nothing evident on the echocardiogram to explain fatigue and shortness of breath.    The patient has been notified of the result and verbalized understanding.  All questions (if any) were answered. Loa Socks, LPN 4/[id number]/2956 2:13 PM

## 2023-01-02 DIAGNOSIS — M47816 Spondylosis without myelopathy or radiculopathy, lumbar region: Secondary | ICD-10-CM | POA: Diagnosis not present

## 2023-02-02 DIAGNOSIS — M47816 Spondylosis without myelopathy or radiculopathy, lumbar region: Secondary | ICD-10-CM | POA: Diagnosis not present

## 2023-02-16 ENCOUNTER — Ambulatory Visit: Payer: Medicare Other | Admitting: Internal Medicine

## 2023-02-19 NOTE — Progress Notes (Unsigned)
Future Appointments  Date Time Provider Department  02/20/2023                    6 mo  ov  3:30 PM Johnathan Cowboy, MD [facility]  06/01/2023                   9 mo  ov 11:00 AM Johnathan Glimpse, NP [facility]  09/08/2023                    cpe          11:00 AM Johnathan Cowboy, MD [facility]  12/06/2023                    wellness 11:00 AM Johnathan Glimpse, NP [facility]    History of Present Illness:       This very nice 77 y.o.  MWM with HTN, HLD, Pre-Diabetes and Vitamin D Deficiency  presents for 6 month follow up.    Chest CT in  2019 showed Aortic Atherosclerosis.           Patient is treated for HTN  ( 2007)   & BP has been controlled at home. Today's BP is at goal - 120/70 .  Patient was dx'd with pAfib  in 2018 & started on Eliquis for CHADsVASC2 by [name].  Now patient is followed  by [name].   Patient has had no complaints of any cardiac type chest pain, palpitations, dyspnea [name], dizziness, claudication or dependent edema.        Hyperlipidemia is controlled with diet & Rosuvastatin. Patient denies myalgias or other med SE's. Last Lipids were at goal :  Lab Results  Component Value Date   CHOL 100 11/25/2022   HDL 43 11/25/2022   LDLCALC 40 11/25/2022   TRIG 86 11/25/2022   CHOLHDL 2.3 11/25/2022     Also, the patient has history of PreDiabetes (A1c 5.7% /2013)  and has had no symptoms of reactive hypoglycemia, diabetic polys, paresthesias or visual blurring.  Last A1c was at goal :  Lab Results  Component Value Date   HGBA1C 5.6 08/02/2022                                                        Further, the patient also has history of Vitamin D Deficiency  ("33" /2008)  and patient supplements vitamin D .    Last vitamin D was at goal :  Lab Results  Component Value Date   VD25OH 61 11/25/2022      Current Outpatient Medications   Medication Instructions   VITAMIN C  500 mg  Daily   carvedilol    6.25 mg  2 times  daily   Eliquis  5 mg  2 times daily   escitalopram (LEXAPRO) 20 MG tablet Take 1/2 tablet   daily.   ezetimibe 10 MG tablet Take  1 tablet   Daily   finasteride   5 mg  Daily   FLAX SEED OIL 1 capsule  Daily   flecainide (TAMBOCOR) 100 MG tablet TAKE 1 TABLET TWICE  DAILY   hydrOXYzine  10 MG tablet 1-2 tabs at bedtime   losartan  50 mg  Daily   Multiple Vitamin  1 tablet,  Oral, Daily   Omega-3 FISH OIL  1 capsule, Oral, 2 times daily   rosuvastatin  20 MG tablet Take  1 whole  tablet  Daily    verapamil   SR 120 MG  Take  1 tablet  Daily     Vitamin D   10,000 Units Daily   zinc   50 mg Daily     Allergies  Allergen Reactions   Xarelto [Rivaroxaban] Other (See Comments)    Severe body aches   Imdur [Isosorbide Nitrate] Other (See Comments)    Severe Headaches   Atorvastatin Other (See Comments)    Myalgias   Clonazepam Other (See Comments)    "Made me jittery"   Tamsulosin Hcl Other (See Comments)    Hypotension     PMHx:   Past Medical History:  Diagnosis Date   A-fib (HCC) 2018   on eliquis   Anxiety    Arthritis    Benign prostatic hyperplasia    BPH (benign prostatic hypertrophy)    Carpal tunnel syndrome, bilateral    Early cataract    GERD (gastroesophageal reflux disease)    History of SCC (squamous cell carcinoma) of skin 10/04/2016   HNP (herniated nucleus pulposus), cervical    Hyperlipidemia    Hypertension 2007   IBS (irritable bowel syndrome)    Obesity    Other abnormal glucose    Pneumonia    " walking"   Pre-diabetes    Vitamin D deficiency    Wears glasses      Immunization History  Administered Date(s) Administered   DTaP 07/11/2005   Influenza Whole 04/02/2013   Influenza, High Dose Seasonal PF 05/01/2014, 05/21/2015, 03/29/2016, 05/31/2017, 05/05/2018, 05/01/2019, 04/19/2021, 04/19/2022   Moderna Sars-Covid-2 Vaccination 08/10/2019, 09/09/2019   Pneumococcal Conjugate-13 05/01/2014   Pneumococcal Polysaccharide-23 09/08/2008,  03/29/2016   Td 12/16/2015   Zoster, Live 12/09/2008     Past Surgical History:  Procedure Laterality Date   ANTERIOR CERVICAL DECOMP/DISCECTOMY FUSION  01-25-2002   C4 - C5   ANTERIOR CERVICAL DECOMP/DISCECTOMY FUSION Bilateral 07/16/2018   Procedure: REMOVAL OF ANTERIOR CERVICAL PLATE, ANTERIOR CERVICAL DECOMPRESSION/DISCECTOMY FUSION CERVICAL THREE- CERVICAL FOUR;  Surgeon: Shirlean Kelly, MD;  Location: [facility] OR;  Service: Neurosurgery;  Laterality: Bilateral;  REMOVAL OF ANTERIOR CERVICAL PLATE, ANTERIOR CERVICAL DECOMPRESSION/DISCECTOMY FUSION CERVICAL THREE- CERVICAL FOUR   ANTERIOR CRUCIATE LIGAMENT REPAIR  1990   RIGHT   APPENDECTOMY  02-07-2007   CARPAL TUNNEL RELEASE Right 07/16/2018   Procedure: CARPAL TUNNEL RELEASE;  Surgeon: Shirlean Kelly, MD;  Location: [facility];  Service: Neurosurgery;  Laterality: Right;  CARPAL TUNNEL RELEASE   CATARACT EXTRACTION Right 01/2020   CERVICAL FUSION  2000   C5 - 6   COLONOSCOPY  05-30-2011   HEMORRHOIDS   HERNIA REPAIR     HIP SURGERY  1999   LEFT HIP --  REMOVAL CALCIUM BUILD-UP   LEFT ANKLE SURG.  1980   LEFT INGUINAL HERNIA REPAIR  1970   LEFT ROTATOR CUFF REPAIR  2002   LOOP RECORDER INSERTION N/A 09/19/2016   Procedure: Loop Recorder Insertion;  Surgeon: Hillis Range, MD;  Location: [facility];  Service: Cardiovascular;  Laterality: N/A;   REVERSE SHOULDER ARTHROPLASTY Right 01/06/2022   Procedure: REVERSE SHOULDER ARTHROPLASTY;  Surgeon: Bjorn Pippin, MD;  Location: [facility];  Service: Orthopedics;  Laterality: Right;   TENDON RECONSTRUCTION Left 07/17/2013   Procedure: LEFT ELBOW LATERAL RECONSTRUCTION;  Surgeon: Marlowe Shores, MD;  Location: [facility];  Service: Orthopedics;  Laterality: Left;   TONSILLECTOMY     TRIGGER FINGER RELEASE Right 07/17/2013   Procedure: RELEASE A-1 PULLEY RIGHT RING FINGER;  Surgeon: Marlowe Shores, MD;  Location: [facility];  Service:  Orthopedics;  Laterality: Right;   UMBILICAL HERNIA REPAIR  2003   XI ROBOTIC ASSISTED SIMPLE PROSTATECTOMY N/A 05/22/2019   Procedure: XI ROBOTIC ASSISTED SIMPLE PROSTATECTOMY;  Surgeon: Sebastian Ache, MD;  Location: [facility];  Service: Urology;  Laterality: N/A;  3 HRS     FHx:    Reviewed / unchanged   SHx:    Reviewed / unchanged    Systems Review:  Constitutional: Denies fever, chills, wt changes, headaches, insomnia, fatigue, night sweats, change in appetite. Eyes: Denies redness, blurred vision, diplopia, discharge, itchy, watery eyes.  ENT: Denies discharge, congestion, post nasal drip, epistaxis, sore throat, earache, hearing loss, dental pain, tinnitus, vertigo, sinus pain, snoring.  CV: Denies chest pain, palpitations, irregular heartbeat, syncope, dyspnea, diaphoresis, orthopnea, PND, claudication or edema. Respiratory: denies cough, dyspnea, DOE, pleurisy, hoarseness, laryngitis, wheezing.  Gastrointestinal: Denies dysphagia, odynophagia, heartburn, reflux, water brash, abdominal pain or cramps, nausea, vomiting, bloating, diarrhea, constipation, hematemesis, melena, hematochezia  or hemorrhoids. Genitourinary: Denies dysuria, frequency, urgency, nocturia, hesitancy, discharge, hematuria or flank pain. Musculoskeletal: Denies arthralgias, myalgias, stiffness, jt. swelling, pain, limping or strain/sprain.  Skin: Denies pruritus, rash, hives, warts, acne, eczema or change in skin lesion(s). Neuro: No weakness, tremor, incoordination, spasms, paresthesia or pain. Psychiatric: Denies confusion, memory loss or sensory loss. Endo: Denies change in weight, skin or hair change.  Heme/Lymph: No excessive bleeding, bruising or enlarged lymph nodes.   Physical Exam  BP 120/70   Pulse 67   Temp 97.9 F (36.6 C)   Resp 16   Ht 6' (1.829 m)   Wt 245 lb (111.1 kg)   SpO2 95%   BMI 33.23 kg/m   Appears  well nourished, well groomed  and in no distress.  Eyes: PERRLA, EOMs,  conjunctiva no swelling or erythema. Sinuses: No frontal/maxillary tenderness ENT/Mouth: EAC's clear, TM's nl w/o erythema, bulging. Nares clear w/o erythema, swelling, exudates. Oropharynx clear without erythema or exudates. Oral hygiene is good. Tongue normal, non obstructing. Hearing intact.  Neck: Supple. Thyroid not palpable. Car 2+/2+ without bruits, nodes or JVD. Chest: Respirations nl with BS clear & equal w/o rales, rhonchi, wheezing or stridor.  Cor: Heart sounds normal w/ regular rate and rhythm without sig. murmurs, gallops, clicks or rubs. Peripheral pulses normal and equal  without edema.  Abdomen: Soft & bowel sounds normal. Non-tender w/o guarding, rebound, hernias, masses or organomegaly.  Lymphatics: Unremarkable.  Musculoskeletal: Full ROM all peripheral extremities, joint stability, 5/5 strength and normal gait.  Skin: Warm, dry without exposed rashes, lesions or ecchymosis apparent.  Neuro: Cranial nerves intact, reflexes equal bilaterally. Sensory-motor testing grossly intact. Tendon reflexes grossly intact.  Pysch: Alert & oriented x 3.  Insight and judgement nl & appropriate. No ideations.   Assessment and Plan:  1. Essential hypertension  - Continue medication, monitor blood pressure at home.  - Continue DASH diet.  Reminder to go to the ER if any CP,  SOB, nausea, dizziness, severe HA, changes vision/speech.    - CBC with Differential/Platelet - COMPLETE METABOLIC PANEL WITH GFR - Magnesium - TSH   2. Hyperlipidemia, mixed  - Continue diet/meds, exercise,& lifestyle modifications.  - Continue monitor periodic cholesterol/liver & renal functions     - Lipid panel - TSH  3. Abnormal glucose  - Continue diet, exercise  - Lifestyle modifications.  - Monitor appropriate labs.   - Hemoglobin A1c - Insulin, random   4. Vitamin D deficiency  - Continue supplementation.    - VITAMIN D 25 Hydroxy   5. Aortic atherosclerosis (HCC) by Chest CT scan  on 10/11/2017  - Lipid panel   6. Paroxysmal atrial fibrillation (HCC)  - TSH   7. Medication management  - CBC with Differential/Platelet - COMPLETE METABOLIC PANEL WITH GFR - Magnesium - Lipid panel - TSH - Hemoglobin A1c - Insulin, random - VITAMIN D 25 Hydroxy          Discussed  regular exercise, BP monitoring, weight control to achieve/maintain BMI less than 25 and discussed med and SE's. Recommended labs to assess /monitor clinical status .  I discussed the assessment and treatment plan with the patient. The patient was provided an opportunity to ask questions and all were answered. The patient agreed with the plan and demonstrated an understanding of the instructions.  I provided over 30 minutes of exam, counseling, chart review and  complex critical decision making.        The patient was advised to call back or seek an in-person evaluation if the symptoms worsen or if the condition fails to improve as anticipated.   Marinus Maw, MD

## 2023-02-20 ENCOUNTER — Ambulatory Visit (INDEPENDENT_AMBULATORY_CARE_PROVIDER_SITE_OTHER): Payer: Medicare Other | Admitting: Internal Medicine

## 2023-02-20 ENCOUNTER — Encounter: Payer: Self-pay | Admitting: Internal Medicine

## 2023-02-20 VITALS — BP 120/70 | HR 67 | Temp 97.9°F | Resp 16 | Ht 72.0 in | Wt 245.0 lb

## 2023-02-20 DIAGNOSIS — E782 Mixed hyperlipidemia: Secondary | ICD-10-CM

## 2023-02-20 DIAGNOSIS — Z79899 Other long term (current) drug therapy: Secondary | ICD-10-CM | POA: Diagnosis not present

## 2023-02-20 DIAGNOSIS — I700 Atherosclerosis of aorta: Secondary | ICD-10-CM

## 2023-02-20 DIAGNOSIS — I480 Paroxysmal atrial fibrillation: Secondary | ICD-10-CM

## 2023-02-20 DIAGNOSIS — R7309 Other abnormal glucose: Secondary | ICD-10-CM

## 2023-02-20 DIAGNOSIS — E559 Vitamin D deficiency, unspecified: Secondary | ICD-10-CM

## 2023-02-20 DIAGNOSIS — I10 Essential (primary) hypertension: Secondary | ICD-10-CM | POA: Diagnosis not present

## 2023-02-20 NOTE — Patient Instructions (Signed)

## 2023-02-21 ENCOUNTER — Encounter: Payer: Self-pay | Admitting: Internal Medicine

## 2023-02-21 NOTE — Progress Notes (Signed)
^<^<^<^<^<^<^<^<^<^<^<^<^<^<^<^<^<^<^<^<^<^<^<^<^<^<^<^<^<^<^<^<^<^<^<^<^ ^>^>^>^>^>^>^>^>^>^>^>>^>^>^>^>^>^>^>^>^>^>^>^>^>^>^>^>^>^>^>^>^>^>^>^>^>  -Test results slightly outside the reference range are not unusual. If there is anything important, I will review this with you,  otherwise it is considered normal test values.  If you have further questions,  please do not hesitate to contact me at the office or via My Chart.   ^<^<^<^<^<^<^<^<^<^<^<^<^<^<^<^<^<^<^<^<^<^<^<^<^<^<^<^<^<^<^<^<^<^<^<^<^ ^>^>^>^>^>^>^>^>^>^>^>^>^>^>^>^>^>^>^>^>^>^>^>^>^>^>^>^>^>^>^>^>^>^>^>^>^  -  2  liver enzymes are slightly elevated  - So Avoid Alcohol & Lose weight   - "Fatty Liver" is the most common cause of Liver cirrhosis  due to                                  being "Overweight" and the only treatment is to Lose weight  !  ^<^<^<^<^<^<^<^<^<^<^<^<^<^<^<^<^<^<^<^<^<^<^<^<^<^<^<^<^<^<^<^<^<^<^<^<^ ^>^>^>^>^>^>^>^>^>^>^>^>^>^>^>^>^>^>^>^>^>^>^>^>^>^>^>^>^>^>^>^>^>^>^>^>^  -  Chol  = 151   7   LDL Chol = 62   - Both  Excellent   - Very low risk for Heart Attack  / Stroke ^>^>^>^>^>^>^>^>^>^>^>^>^>^>^>^>^>^>^>^>^>^>^>^>^>^>^>^>^>^>^>^>^>^>^>^>^ ^>^>^>^>^>^>^>^>^>^>^>^>^>^>^>^>^>^>^>^>^>^>^>^>^>^>^>^>^>^>^>^>^>^>^>^>^  -  Total Chol = 151   &  LDL Chol = 62   - Both   Excellent   - Very low risk for Heart Attack  / Stroke  ^>^>^>^>^>^>^>^>^>^>^>^>^>^>^>^>^>^>^>^>^>^>^>^>^>^>^>^>^>^>^>^>^>^>^>^>^ ^>^>^>^>^>^>^>^>^>^>^>^>^>^>^>^>^>^>^>^>^>^>^>^>^>^>^>^>^>^>^>^>^>^>^>^>^  -  But Triglycerides (  = 245 ) or fats in blood are too high                 (   Ideal or  Goal is less than 150  !  )    - Recommend avoid fried & greasy foods,  sweets / candy,   - Avoid white rice  (brown or wild rice or Quinoa is OK),   - Avoid white potatoes  (sweet potatoes are OK)   - Avoid anything made from white flour  - bagels, doughnuts, rolls, buns, biscuits, white and   wheat breads, pizza crust and  traditional  pasta made of white flour & egg white  - (vegetarian pasta or spinach or wheat pasta is OK).    - Multi-grain bread is OK - like multi-grain flat bread or  sandwich thins.   - Avoid alcohol in excess.   - Exercise is also important.  ^<^<^<^<^<^<^<^<^<^<^<^<^<^<^<^<^<^<^<^<^<^<^<^<^<^<^<^<^<^<^<^<^<^<^<^<^ ^>^>^>^>^>^>^>^>^>^>^>^>^>^>^>^>^>^>^>^>^>^>^>^>^>^>^>^>^>^>^>^>^>^>^>^>^  - A1c = 5.7%  is  elevated in the borderline and                                                       early or pre-diabetes range which has the same   300% increased risk for heart attack, stroke, cancer and                                         alzheimer- type vascular dementia as full blown diabetes.   But the good news is that diet, exercise with                                                  weight loss can cure the early diabetes at this point.  ^<^<^<^<^<^<^<^<^<^<^<^<^<^<^<^<^<^<^<^<^<^<^<^<^<^<^<^<^<^<^<^<^<^<^<^<^ ^>^>^>^>^>^>^>^>^>^>^>^>^>^>^>^>^>^>^>^>^>^>^>^>^>^>^>^>^>^>^>^>^>^>^>^>^  -  Vitamin D = 45 - is very low   -  Vitamin D goal is between 70-100.   - Please make sure that you are taking your Vitamin D  10,000 units /day as directed.   - It is very important as a natural anti-inflammatory and helping the                           immune system protect against viral infections, like the Covid-19    helping hair, skin, and nails, as well as reducing stroke and heart attack risk.   - It helps your bones and helps with mood.  - It also decreases numerous cancer risks so please                                                                                           take it as directed.   - Low Vit D is associated with a 200-300% higher risk for CANCER   and 200-300% higher risk for HEART   ATTACK  &  STROKE.    - It is also associated with higher death rate at younger ages,   autoimmune diseases like Rheumatoid arthritis, Lupus, Multiple Sclerosis.     -  Also many other serious conditions, like depression, Alzheimer's  Dementia,  muscle aches, fatigue, fibromyalgia   ^<^<^<^<^<^<^<^<^<^<^<^<^<^<^<^<^<^<^<^<^<^<^<^<^<^<^<^<^<^<^<^<^<^<^<^<^ ^>^>^>^>^>^>^>^>^>^>^>^>^>^>^>^>^>^>^>^>^>^>^>^>^>^>^>^>^>^>^>^>^>^>^>^>^  -  All Else - CBC - Kidneys - Electrolytes - Liver - Magnesium & Thyroid    - all  Normal / OK ^<^<^<^<^<^<^<^<^<^<^<^<^<^<^<^<^<^<^<^<^<^<^<^<^<^<^<^<^<^<^<^<^<^<^<^<^ ^>^>^>^>^>^>^>^>^>^>^>^>^>^>^>^>^>^>^>^>^>^>^>^>^>^>^>^>^>^>^>^>^>^>^>^>^

## 2023-02-22 ENCOUNTER — Encounter: Payer: Self-pay | Admitting: Internal Medicine

## 2023-02-23 ENCOUNTER — Other Ambulatory Visit: Payer: Self-pay | Admitting: Student

## 2023-02-28 ENCOUNTER — Ambulatory Visit: Payer: Medicare Other | Admitting: Internal Medicine

## 2023-03-02 ENCOUNTER — Other Ambulatory Visit: Payer: Self-pay | Admitting: Internal Medicine

## 2023-03-02 DIAGNOSIS — G4700 Insomnia, unspecified: Secondary | ICD-10-CM

## 2023-03-02 MED ORDER — TRAZODONE HCL 150 MG PO TABS
ORAL_TABLET | ORAL | 3 refills | Status: AC
Start: 2023-03-02 — End: ?

## 2023-03-14 DIAGNOSIS — M7021 Olecranon bursitis, right elbow: Secondary | ICD-10-CM | POA: Diagnosis not present

## 2023-03-14 DIAGNOSIS — M25561 Pain in right knee: Secondary | ICD-10-CM | POA: Diagnosis not present

## 2023-03-14 DIAGNOSIS — M25551 Pain in right hip: Secondary | ICD-10-CM | POA: Diagnosis not present

## 2023-03-14 DIAGNOSIS — W19XXXA Unspecified fall, initial encounter: Secondary | ICD-10-CM | POA: Diagnosis not present

## 2023-03-23 ENCOUNTER — Telehealth: Payer: Self-pay | Admitting: *Deleted

## 2023-03-23 DIAGNOSIS — M25561 Pain in right knee: Secondary | ICD-10-CM | POA: Diagnosis not present

## 2023-03-23 DIAGNOSIS — M25551 Pain in right hip: Secondary | ICD-10-CM | POA: Diagnosis not present

## 2023-03-23 NOTE — Telephone Encounter (Signed)
LAST OFFICE VISIT 11/23/22 NO FUTURE VISIT SCHEDULED

## 2023-03-23 NOTE — Telephone Encounter (Signed)
   Pre-operative Risk Assessment    Patient Name: [name]  DOB: June 18, 1946 MRN: [id number]      Request for Surgical Clearance    Procedure:   RIGHT TOTAL KNEE ARTHROPLASTY  Date of Surgery:  Clearance TBD                                 Surgeon:  Weber Cooks, MD Surgeon's Group or Practice Name:  [facility] Phone number:  [phone] Fax number:  [phone]   Type of Clearance Requested:   - Medical  - Pharmacy:  Hold Apixaban (Eliquis) NOT INDIATED   Type of Anesthesia:  Spinal   Additional requests/questions:    [name]   03/23/2023, 12:57 PM

## 2023-03-24 ENCOUNTER — Other Ambulatory Visit: Payer: Self-pay

## 2023-03-24 ENCOUNTER — Telehealth: Payer: Self-pay | Admitting: *Deleted

## 2023-03-24 MED ORDER — ELIQUIS 5 MG PO TABS: 5.0000 mg | ORAL_TABLET | Freq: Two times a day (BID) | ORAL | 1 refills | Status: AC

## 2023-03-24 NOTE — Telephone Encounter (Signed)
Pt has been scheduled for tele pre op appt 04/06/23 @ 2:20. Med rec and consent are done.

## 2023-03-24 NOTE — Telephone Encounter (Signed)
Pt has been scheduled for tele pre op appt 04/06/23 @ 2:20. Med rec and consent are done.     Patient Consent for Virtual Visit        [name] has provided verbal consent on 03/24/2023 for a virtual visit (video or telephone).   CONSENT FOR VIRTUAL VISIT FOR:  [name]  By participating in this virtual visit I agree to the following:  I hereby voluntarily request, consent and authorize [facility] and its employed or contracted physicians, physician assistants, nurse practitioners or other licensed health care professionals (the Practitioner), to provide me with telemedicine health care services (the "Services") as deemed necessary by the treating Practitioner. I acknowledge and consent to receive the Services by the Practitioner via telemedicine. I understand that the telemedicine visit will involve communicating with the Practitioner through live audiovisual communication technology and the disclosure of certain medical information by electronic transmission. I acknowledge that I have been given the opportunity to request an in-person assessment or other available alternative prior to the telemedicine visit and am voluntarily participating in the telemedicine visit.  I understand that I have the right to withhold or withdraw my consent to the use of telemedicine in the course of my care at any time, without affecting my right to future care or treatment, and that the Practitioner or I may terminate the telemedicine visit at any time. I understand that I have the right to inspect all information obtained and/or recorded in the course of the telemedicine visit and may receive copies of available information for a reasonable fee.  I understand that some of the potential risks of receiving the Services via telemedicine include:  Delay or interruption in medical evaluation due to technological equipment failure or disruption; Information transmitted may not be sufficient  (e.g. poor resolution of images) to allow for appropriate medical decision making by the Practitioner; and/or  In rare instances, security protocols could fail, causing a breach of personal health information.  Furthermore, I acknowledge that it is my responsibility to provide information about my medical history, conditions and care that is complete and accurate to the best of my ability. I acknowledge that Practitioner's advice, recommendations, and/or decision may be based on factors not within their control, such as incomplete or inaccurate data provided by me or distortions of diagnostic images or specimens that may result from electronic transmissions. I understand that the practice of medicine is not an exact science and that Practitioner makes no warranties or guarantees regarding treatment outcomes. I acknowledge that a copy of this consent can be made available to me via my patient portal [facility] MyChart), or I can request a printed copy by calling the office of [facility].    I understand that my insurance will be billed for this visit.   I have read or had this consent read to me. I understand the contents of this consent, which adequately explains the benefits and risks of the Services being provided via telemedicine.  I have been provided ample opportunity to ask questions regarding this consent and the Services and have had my questions answered to my satisfaction. I give my informed consent for the services to be provided through the use of telemedicine in my medical care

## 2023-03-24 NOTE — Telephone Encounter (Signed)
Name: [name]  DOB: 1946/06/11  MRN: [id number]  Primary Cardiologist: Hillis Range, MD (Inactive)   Preoperative team, please contact this patient and set up a phone call appointment for further preoperative risk assessment. Please obtain consent and complete medication review. Thank you for your help.  I confirm that guidance regarding antiplatelet and oral anticoagulation therapy has been completed and, if necessary, noted below.  Per office protocol, patient can hold Eliquis for 3 days prior to procedure.  Please resume Eliquis as soon as possible postprocedure, at the discretion of the surgeon.   Joylene Grapes, NP 03/24/2023, 12:55 PM [facility]

## 2023-03-24 NOTE — Telephone Encounter (Signed)
Prescription refill request for Eliquis received. Indication:afib Last office visit:5/24 Scr:0.84  8/24 Age: 77 Weight:111.1  kg  Prescription refilled

## 2023-03-24 NOTE — Telephone Encounter (Signed)
Patient with diagnosis of afib on Eliquis for anticoagulation.    Procedure: right TKA Date of procedure: TBD  CHA2DS2-VASc Score = 4  This indicates a 4.8% annual risk of stroke. The patient's score is based upon: CHF History: 0 HTN History: 1 Diabetes History: 0 Stroke History: 0 Vascular Disease History: 1 Age Score: 2 Gender Score: 0   CrCl 22mL/min using adjusted body weight Platelet count 245K  Per office protocol, patient can hold Eliquis for 3 days prior to procedure.    **This guidance is not considered finalized until pre-operative APP has relayed final recommendations.**

## 2023-04-05 ENCOUNTER — Ambulatory Visit (INDEPENDENT_AMBULATORY_CARE_PROVIDER_SITE_OTHER): Payer: Medicare Other | Admitting: Nurse Practitioner

## 2023-04-05 ENCOUNTER — Encounter: Payer: Self-pay | Admitting: Nurse Practitioner

## 2023-04-05 VITALS — BP 96/62 | HR 68 | Temp 97.5°F | Ht 72.0 in | Wt 235.2 lb

## 2023-04-05 DIAGNOSIS — R519 Headache, unspecified: Secondary | ICD-10-CM

## 2023-04-05 DIAGNOSIS — E861 Hypovolemia: Secondary | ICD-10-CM | POA: Diagnosis not present

## 2023-04-05 DIAGNOSIS — Z79899 Other long term (current) drug therapy: Secondary | ICD-10-CM | POA: Diagnosis not present

## 2023-04-05 DIAGNOSIS — R7989 Other specified abnormal findings of blood chemistry: Secondary | ICD-10-CM

## 2023-04-05 DIAGNOSIS — R490 Dysphonia: Secondary | ICD-10-CM | POA: Diagnosis not present

## 2023-04-05 DIAGNOSIS — R197 Diarrhea, unspecified: Secondary | ICD-10-CM | POA: Diagnosis not present

## 2023-04-05 DIAGNOSIS — R6884 Jaw pain: Secondary | ICD-10-CM

## 2023-04-05 MED ORDER — DEXAMETHASONE 4 MG PO TABS
ORAL_TABLET | ORAL | 0 refills | Status: DC
Start: 2023-04-05 — End: 2023-06-01

## 2023-04-05 NOTE — Patient Instructions (Signed)
Diarrhea, Adult Diarrhea is frequent loose and sometimes watery bowel movements. Diarrhea can make you feel weak and cause you to become dehydrated. Dehydration is a condition in which there is not enough water or other fluids in the body. Dehydration can make you tired and thirsty, cause you to have a dry mouth, and decrease how often you urinate. Diarrhea typically lasts 2-3 days. However, it can last longer if it is a sign of something more serious. It is important to treat your diarrhea as told by your health care provider. Follow these instructions at home: Eating and drinking     Follow these recommendations as told by your health care provider: Take an oral rehydration solution (ORS). This is an over-the-counter medicine that helps return your body to its normal balance of nutrients and water. It is found at pharmacies and retail stores. Drink enough fluid to keep your urine pale yellow. Drink fluids such as water, diluted fruit juice, and low-calorie sports drinks. You can drink milk also, if desired. Sucking on ice chips is another way to get fluids. Avoid drinking fluids that contain a lot of sugar or caffeine, such as soda, energy drinks, and regular sports drinks. Avoid alcohol. Eat bland, easy-to-digest foods in small amounts as you are able. These foods include bananas, applesauce, rice, lean meats, toast, and crackers. Avoid spicy or fatty foods.  Medicines Take over-the-counter and prescription medicines only as told by your health care provider. If you were prescribed antibiotics, take them as told by your health care provider. Do not stop using the antibiotic even if you start to feel better. General instructions  Wash your hands often using soap and water for at least 20 seconds. If soap and water are not available, use hand sanitizer. Others in the household should wash their hands as well. Hands should be washed: After using the toilet or changing a diaper. Before  preparing, cooking, or serving food. While caring for a sick person or while visiting someone in a hospital. Rest at home while you recover. Take a warm bath to relieve any burning or pain from frequent diarrhea episodes. Watch your condition for any changes. Contact a health care provider if: You have a fever. Your diarrhea gets worse. You have new symptoms. You vomit every time you eat or drink. You feel light-headed, dizzy, or have a headache. You have muscle cramps. You have signs of dehydration, such as: Dark urine, very little urine, or no urine. Cracked lips. Dry mouth. Sunken eyes. Sleepiness. Weakness. You have bloody or black stools or stools that look like tar. You have severe pain, cramping, or bloating in your abdomen. Your skin feels cold and clammy. You feel confused. Get help right away if: You have chest pain or your heart is beating very quickly. You have trouble breathing or you are breathing very quickly. You feel extremely weak or you faint. These symptoms may be an emergency. Get help right away. Call 911. Do not wait to see if the symptoms will go away. Do not drive yourself to the hospital. This information is not intended to replace advice given to you by your health care provider. Make sure you discuss any questions you have with your health care provider. Document Revised: 12/14/2021 Document Reviewed: 12/14/2021 Elsevier Patient Education  2024 [employer].

## 2023-04-05 NOTE — Progress Notes (Signed)
Assessment and Plan:  CLINE GRAVATT was seen today for an episodic visit.  Diagnoses and all order for this visit:  Diarrhea of presumed infectious origin Stay well hydrated - suggested electrolyte supplement Aim for 2L daily Contact office for any increase in lethargy, dry mucus membranes Obtain CBC for review of infectious etiology Obtain CMP for electrolyte review  - CBC with Differential/Platelet - COMPLETE METABOLIC PANEL WITH GFR  Elevated LFTs Monitor trend given increase in diarrhea.  - COMPLETE METABOLIC PANEL WITH GFR  Jaw pain Start steroid taper Tetanus UTD as of 2017 If symptoms worsen report to ER.    - dexamethasone (DECADRON) 4 MG tablet; Take 1 tab 3 x /day for 2 days, then 2 x /day for 2  Days,  then 1 tab daily  Dispense: 13 tablet; Refill: 0  Hoarse voice quality Possible GI related  Continue to monitor for worsening symptoms. Warm salt water gargles several times throughout the day.   Temporal headache Obtain additional inflammatory markers for underlying etiology Start steroid taper as directed Report to ER for any increase in worsening HA.  - Sedimentation rate - C-reactive protein - dexamethasone (DECADRON) 4 MG tablet; Take 1 tab 3 x /day for 2 days, then 2 x /day for 2  Days,  then 1 tab daily  Dispense: 13 tablet; Refill: 0  Medication management All medications discussed and reviewed in full. All questions and concerns regarding medications addressed.    - CBC with Differential/Platelet - COMPLETE METABOLIC PANEL WITH GFR - Sedimentation rate - C-reactive protein  Hypotension due to hypovolemia Most likely secondary to diarrhea x 4 days Check BP in the AM and hold Losartan if <120/80 Change positions slowly Push fluids Continue to monitor  Notify office for further evaluation and treatment, questions or concerns if s/s fail to improve. The risks and benefits of my recommendations, as well as other treatment options were  discussed with the patient today. Questions were answered.  The patient was advised to call back or seek an in-person evaluation if any symptoms worsen or if the condition fails to improve as anticipated.   Further disposition pending results of labs. Discussed med's effects and SE's.    I discussed the assessment and treatment plan with the patient. The patient was provided an opportunity to ask questions and all were answered. The patient agreed with the plan and demonstrated an understanding of the instructions.  Discussed med's effects and SE's. Screening labs and tests as requested with regular follow-up as recommended.  I provided 30 minutes of face-to-face time during this encounter including counseling, chart review, and critical decision making was preformed.  Today's Plan of Care is based on a patient-centered health care approach known as shared decision making - the decisions, tests and treatments allow for patient preferences and values to be balanced with clinical evidence.     Future Appointments  Date Time Provider Department Center  04/06/2023  2:20 PM [facility] PRE [facility] CLEARANCE APP [facility] None  06/01/2023 11:00 AM Adela Glimpse, NP [facility] None  09/08/2023 11:00 AM [name], MD [facility] None  12/06/2023 11:00 AM Adela Glimpse, NP [facility] None    -----------------------------------------------------------------------------------------------------------------    HPI BP 96/62   Pulse 68   Temp (!) 97.5 F (36.4 C)   Ht 6' (1.829 m)   Wt 235 lb 3.2 oz (106.7 kg)   SpO2 97%   BMI 31.90 kg/m   77 y.o.male presents for evaluation of new onset jaw tightness with diarrhea  x 4 days, hoarse voice, temporal HA, and fatigue.  He reports having his teeth cleaned x3 weeks ago, however, nothing out of the norm.  He also got a new puppy 4 months ago.  He is UTD on tetanus (2017).  He does not report recent travel, new medications or supplements.   Denies fever, blood in stool, abd pain.  BP is lower in clinic today.  Feels as though he is eating and drinking well but has noticed a drop in weight (10 lb) since last OV.  He did have slight elevation in LFTs last OV 02/2023.  Denies any new or recent stressors.   Past Medical History:  Diagnosis Date   A-fib (HCC) 2018   on eliquis   Anxiety    Arthritis    Benign prostatic hyperplasia    BPH (benign prostatic hypertrophy)    Carpal tunnel syndrome, bilateral    Early cataract    GERD (gastroesophageal reflux disease)    History of SCC (squamous cell carcinoma) of skin 10/04/2016   HNP (herniated nucleus pulposus), cervical    Hyperlipidemia    Hypertension 2007   IBS (irritable bowel syndrome)    Obesity    Other abnormal glucose    Pneumonia    " walking"   Pre-diabetes    Vitamin D deficiency    Wears glasses      Allergies  Allergen Reactions   Xarelto [Rivaroxaban] Other (See Comments)    Severe body aches   Imdur [Isosorbide Nitrate] Other (See Comments)    Severe Headaches   Atorvastatin Other (See Comments)    Myalgias   Clonazepam Other (See Comments)    "Made me jittery"   Tamsulosin Hcl Other (See Comments)    Hypotension    Current Outpatient Medications on File Prior to Visit  Medication Sig   carvedilol (COREG) 6.25 MG tablet Take 1 tablet (6.25 mg total) by mouth 2 (two) times daily.   Cholecalciferol (VITAMIN D3) 5000 units CAPS Take 10,000 Units by mouth daily.    ELIQUIS 5 MG TABS tablet Take 1 tablet (5 mg total) by mouth 2 (two) times daily.   escitalopram (LEXAPRO) 20 MG tablet Take 1/2 tablet (10 mg) daily.   ezetimibe (ZETIA) 10 MG tablet Take  1 tablet   Daily  for Cholesterol   finasteride (PROSCAR) 5 MG tablet Take 5 mg by mouth daily.   Flaxseed, Linseed, (FLAX SEED OIL PO) Take 1 capsule by mouth daily.   flecainide (TAMBOCOR) 100 MG tablet TAKE 1 TABLET BY MOUTH TWICE  DAILY   hydrOXYzine (ATARAX) 10 MG tablet 1-2 tabs at bedtime    losartan (COZAAR) 50 MG tablet Take 1 tablet (50 mg total) by mouth daily.   Multiple Vitamin (MULTIVITAMIN) tablet Take 1 tablet by mouth daily.   Omega-3 Fatty Acids (FISH OIL PO) Take 1 capsule by mouth 2 (two) times daily.   rosuvastatin (CRESTOR) 20 MG tablet Take  1 whole  tablet (20 mg)  Daily for Cholesterol   traZODone (DESYREL) 150 MG tablet Take  1/2 to 1 tablet   1 to 2 hours  before Bedtime  as needed for  Sleep                                               /  TAKE                                         BY                                                 MOUTH   verapamil (CALAN-SR) 120 MG CR tablet Take  1 tablet  Daily  with Food  for BP & Heart Rhythm   vitamin C (ASCORBIC ACID) 500 MG tablet Take 500 mg by mouth daily.   zinc gluconate 50 MG tablet Take 50 mg by mouth daily.   No current facility-administered medications on file prior to visit.    ROS: all negative except what is noted in the HPI.   Physical Exam:  BP 96/62   Pulse 68   Temp (!) 97.5 F (36.4 C)   Ht 6' (1.829 m)   Wt 235 lb 3.2 oz (106.7 kg)   SpO2 97%   BMI 31.90 kg/m   General Appearance: NAD.  Awake, conversant and cooperative. Eyes: PERRLA, EOMs intact.  Sclera white.  Conjunctiva without erythema. Sinuses: No frontal/maxillary tenderness.  No nasal discharge. Nares patent.  ENT/Mouth: Ext aud canals clear.  Bilateral TMs w/DOL and without erythema or bulging. Hearing intact.  Posterior pharynx without swelling or exudate.  Tonsils without swelling or erythema.  Neck: Supple.  No masses, nodules or thyromegaly. Respiratory: Effort is regular with non-labored breathing. Breath sounds are equal bilaterally without rales, rhonchi, wheezing or stridor.  Cardio: RRR with no MRGs. Brisk peripheral pulses without edema.  Abdomen: Active BS in all four quadrants.  Soft and non-tender without guarding, rebound tenderness, hernias or  masses. Lymphatics: Non tender without lymphadenopathy.  Musculoskeletal: Full ROM, 5/5 strength, normal ambulation.  No clubbing or cyanosis. Skin: Appropriate color for ethnicity. Warm without rashes, lesions, ecchymosis, ulcers.  Neuro: CN II-XII grossly normal. Normal muscle tone without cerebellar symptoms and intact sensation.   Psych: AO X 3,  appropriate mood and affect, insight and judgment.     Adela Glimpse, NP 3:56 PM [facility]

## 2023-04-06 ENCOUNTER — Ambulatory Visit: Payer: Medicare Other | Attending: Cardiology

## 2023-04-06 ENCOUNTER — Encounter: Payer: Self-pay | Admitting: Nurse Practitioner

## 2023-04-06 DIAGNOSIS — Z01810 Encounter for preprocedural cardiovascular examination: Secondary | ICD-10-CM | POA: Diagnosis not present

## 2023-04-06 LAB — COMPLETE METABOLIC PANEL WITH GFR
AG Ratio: 2 (calc) (ref 1.0–2.5)
ALT: 31 U/L (ref 9–46)
AST: 22 U/L (ref 10–35)
Albumin: 4 g/dL (ref 3.6–5.1)
Alkaline phosphatase (APISO): 115 U/L (ref 35–144)
BUN: 25 mg/dL (ref 7–25)
CO2: 25 mmol/L (ref 20–32)
Calcium: 10 mg/dL (ref 8.6–10.3)
Chloride: 106 mmol/L (ref 98–110)
Creat: 0.94 mg/dL (ref 0.70–1.28)
Globulin: 2 g/dL (calc) (ref 1.9–3.7)
Glucose, Bld: 102 mg/dL — ABNORMAL HIGH (ref 65–99)
Potassium: 4.2 mmol/L (ref 3.5–5.3)
Sodium: 142 mmol/L (ref 135–146)
Total Bilirubin: 0.6 mg/dL (ref 0.2–1.2)
Total Protein: 6 g/dL — ABNORMAL LOW (ref 6.1–8.1)
eGFR: 83 mL/min/{1.73_m2} (ref >=60)

## 2023-04-06 LAB — SEDIMENTATION RATE: Sed Rate: 9 mm/h (ref 0–20)

## 2023-04-06 LAB — CBC WITH DIFFERENTIAL/PLATELET
Absolute Monocytes: 819 cells/uL (ref 200–950)
Basophils Absolute: 71 cells/uL (ref 0–200)
Basophils Relative: 0.8 %
Eosinophils Absolute: 356 cells/uL (ref 15–500)
Eosinophils Relative: 4 %
HCT: 46 % (ref 38.5–50.0)
Hemoglobin: 15.1 g/dL (ref 13.2–17.1)
Lymphs Abs: 1299 cells/uL (ref 850–3900)
MCH: 31.9 pg (ref 27.0–33.0)
MCHC: 32.8 g/dL (ref 32.0–36.0)
MCV: 97 fL (ref 80.0–100.0)
MPV: 9.7 fL (ref 7.5–12.5)
Monocytes Relative: 9.2 %
Neutro Abs: 6355 cells/uL (ref 1500–7800)
Neutrophils Relative %: 71.4 %
Platelets: 315 10*3/uL (ref 140–400)
RBC: 4.74 10*6/uL (ref 4.20–5.80)
RDW: 13.1 % (ref 11.0–15.0)
Total Lymphocyte: 14.6 %
WBC: 8.9 10*3/uL (ref 3.8–10.8)

## 2023-04-06 LAB — C-REACTIVE PROTEIN: CRP: <3 mg/L (ref <8.0)

## 2023-04-06 NOTE — Progress Notes (Addendum)
Virtual Visit via Telephone Note   Because of [name]'s co-morbid illnesses, he is at least at moderate risk for complications without adequate follow up.  This format is felt to be most appropriate for this patient at this time.  The patient did not have access to video technology/had technical difficulties with video requiring transitioning to audio format only (telephone).  All issues noted in this document were discussed and addressed.  No physical exam could be performed with this format.  Please refer to the patient's chart for his consent to telehealth for [name].  Evaluation Performed:  Preoperative cardiovascular risk assessment _____________   Date:  04/06/2023   Patient ID:  [name], [name] [birth year], MRN [id number] Patient Location:  Home Provider location:   Office  Primary Care Provider:  [name], MD Primary Cardiologist:  Hillis Range, MD (Inactive)  Chief Complaint / Patient Profile   77 y.o. y/o male with a h/o essential hypertension, atrial fibrillation, hyperlipidemia who is pending right TKA and presents today for telephonic preoperative cardiovascular risk assessment.  History of Present Illness    [name] is a 77 y.o. male who presents via audio/video conferencing for a telehealth visit today.  Pt was last seen in cardiology clinic on 11/23/2022 by Dr. Nelly Laurence.  At that time REYAN IZZARD was doing well .  The patient is now pending procedure as outlined above. Since his last visit, he continues to be stable from a cardiac standpoint.  Today he denies chest pain, shortness of breath, lower extremity edema, fatigue, palpitations, melena, hematuria, hemoptysis, diaphoresis, weakness, presyncope, syncope, orthopnea, and PND.   Past Medical History    Past Medical History:  Diagnosis Date   A-fib (HCC) 2018   on eliquis   Anxiety    Arthritis    Benign prostatic hyperplasia    BPH (benign prostatic  hypertrophy)    Carpal tunnel syndrome, bilateral    Early cataract    GERD (gastroesophageal reflux disease)    History of SCC (squamous cell carcinoma) of skin 10/04/2016   HNP (herniated nucleus pulposus), cervical    Hyperlipidemia    Hypertension 2007   IBS (irritable bowel syndrome)    Obesity    Other abnormal glucose    Pneumonia    " walking"   Pre-diabetes    Vitamin D deficiency    Wears glasses    Past Surgical History:  Procedure Laterality Date   ANTERIOR CERVICAL DECOMP/DISCECTOMY FUSION  01-25-2002   C4 - C5   ANTERIOR CERVICAL DECOMP/DISCECTOMY FUSION Bilateral 07/16/2018   Procedure: REMOVAL OF ANTERIOR CERVICAL PLATE, ANTERIOR CERVICAL DECOMPRESSION/DISCECTOMY FUSION CERVICAL THREE- CERVICAL FOUR;  Surgeon: Shirlean Kelly, MD;  Location: [facility] OR;  Service: Neurosurgery;  Laterality: Bilateral;  REMOVAL OF ANTERIOR CERVICAL PLATE, ANTERIOR CERVICAL DECOMPRESSION/DISCECTOMY FUSION CERVICAL THREE- CERVICAL FOUR   ANTERIOR CRUCIATE LIGAMENT REPAIR  1990   RIGHT   APPENDECTOMY  02-07-2007   CARPAL TUNNEL RELEASE Right 07/16/2018   Procedure: CARPAL TUNNEL RELEASE;  Surgeon: Shirlean Kelly, MD;  Location: [facility] OR;  Service: Neurosurgery;  Laterality: Right;  CARPAL TUNNEL RELEASE   CATARACT EXTRACTION Right 01/2020   CERVICAL FUSION  2000   C5 - 6   COLONOSCOPY  05-30-2011   HEMORRHOIDS   HERNIA REPAIR     HIP SURGERY  1999   LEFT HIP --  REMOVAL CALCIUM BUILD-UP   LEFT ANKLE SURG.  1980   LEFT INGUINAL HERNIA REPAIR  1970   LEFT  ROTATOR CUFF REPAIR  2002   LOOP RECORDER INSERTION N/A 09/19/2016   Procedure: Loop Recorder Insertion;  Surgeon: Hillis Range, MD;  Location: [facility];  Service: Cardiovascular;  Laterality: N/A;   REVERSE SHOULDER ARTHROPLASTY Right 01/06/2022   Procedure: REVERSE SHOULDER ARTHROPLASTY;  Surgeon: Bjorn Pippin, MD;  Location: [facility];  Service: Orthopedics;  Laterality: Right;   TENDON RECONSTRUCTION Left  07/17/2013   Procedure: LEFT ELBOW LATERAL RECONSTRUCTION;  Surgeon: Marlowe Shores, MD;  Location: [facility];  Service: Orthopedics;  Laterality: Left;   TONSILLECTOMY     TRIGGER FINGER RELEASE Right 07/17/2013   Procedure: RELEASE A-1 PULLEY RIGHT RING FINGER;  Surgeon: Marlowe Shores, MD;  Location: [facility];  Service: Orthopedics;  Laterality: Right;   UMBILICAL HERNIA REPAIR  2003   XI ROBOTIC ASSISTED SIMPLE PROSTATECTOMY N/A 05/22/2019   Procedure: XI ROBOTIC ASSISTED SIMPLE PROSTATECTOMY;  Surgeon: Sebastian Ache, MD;  Location: [facility];  Service: Urology;  Laterality: N/A;  3 HRS    Allergies  Allergies  Allergen Reactions   Xarelto [Rivaroxaban] Other (See Comments)    Severe body aches   Imdur [Isosorbide Nitrate] Other (See Comments)    Severe Headaches   Atorvastatin Other (See Comments)    Myalgias   Clonazepam Other (See Comments)    "Made me jittery"   Tamsulosin Hcl Other (See Comments)    Hypotension    Home Medications    Prior to Admission medications   Medication Sig Start Date End Date Taking? Authorizing Provider  carvedilol (COREG) 6.25 MG tablet Take 1 tablet (6.25 mg total) by mouth 2 (two) times daily. 10/03/22   Graciella Freer, PA-C  Cholecalciferol (VITAMIN D3) 5000 units CAPS Take 10,000 Units by mouth daily.     [provider]  dexamethasone (DECADRON) 4 MG tablet Take 1 tab 3 x /day for 2 days, then 2 x /day for 2  Days,  then 1 tab daily 04/05/23   Cranford, Archie Patten, NP  ELIQUIS 5 MG TABS tablet Take 1 tablet (5 mg total) by mouth 2 (two) times daily. 03/24/23   Mealor, Roberts Gaudy, MD  escitalopram (LEXAPRO) 20 MG tablet Take 1/2 tablet (10 mg) daily. 04/19/22   Adela Glimpse, NP  ezetimibe (ZETIA) 10 MG tablet Take  1 tablet   Daily  for Cholesterol 08/03/22   [name], MD  finasteride (PROSCAR) 5 MG tablet Take 5 mg by mouth daily.    [provider]  Flaxseed, Linseed, (FLAX  SEED OIL PO) Take 1 capsule by mouth daily.    [provider]  flecainide (TAMBOCOR) 100 MG tablet TAKE 1 TABLET BY MOUTH TWICE  DAILY 02/23/23   Mealor, Roberts Gaudy, MD  hydrOXYzine (ATARAX) 10 MG tablet 1-2 tabs at bedtime 11/25/22   Raynelle Dick, NP  losartan (COZAAR) 50 MG tablet Take 1 tablet (50 mg total) by mouth daily. 11/25/22   Raynelle Dick, NP  Multiple Vitamin (MULTIVITAMIN) tablet Take 1 tablet by mouth daily.    [provider]  Omega-3 Fatty Acids (FISH OIL PO) Take 1 capsule by mouth 2 (two) times daily.    [provider]  rosuvastatin (CRESTOR) 20 MG tablet Take  1 whole  tablet (20 mg)  Daily for Cholesterol 08/07/22   [name], MD  traZODone (DESYREL) 150 MG tablet Take  1/2 to 1 tablet   1 to 2 hours  before Bedtime  as needed for  Sleep                                               /                                                                   TAKE                                         BY                                                 MOUTH 03/02/23   [name], MD  verapamil (CALAN-SR) 120 MG CR tablet Take  1 tablet  Daily  with Food  for BP & Heart Rhythm 09/16/22   [name], MD  vitamin C (ASCORBIC ACID) 500 MG tablet Take 500 mg by mouth daily.    [provider]  zinc gluconate 50 MG tablet Take 50 mg by mouth daily.    [provider]    Physical Exam    Vital Signs:  EMONTAE ISABELLA does not have vital signs available for review today.  Given telephonic nature of communication, physical exam is limited. AAOx3. NAD. Normal affect.  Speech and respirations are unlabored.  Accessory Clinical Findings    None  Assessment & Plan    1.  Preoperative Cardiovascular Risk Assessment:RIGHT TOTAL KNEE ARTHROPLASTY , Weber Cooks, MD , [facility] , Fax number:  [phone]       Primary Cardiologist: Hillis Range, MD (Inactive)  Chart reviewed as part of pre-operative protocol  coverage. Given past medical history and time since last visit, based on ACC/AHA guidelines, RYKKER JASA would be at acceptable risk for the planned procedure without further cardiovascular testing.   Patient was advised that if he develops new symptoms prior to surgery to contact our office to arrange a follow-up appointment.  He verbalized understanding.  His RCRI is a class II risk, 0.9% risk of major cardiac event.  He is able to complete greater than 4 METS of physical activity.  Per office protocol, patient can hold Eliquis for 3 days prior to procedure.  Please resume Eliquis as soon as possible postprocedure, at the discretion of the surgeon.   I will route this recommendation to the requesting party via Epic fax function and remove from pre-op pool.       Ronney Asters, NP  04/06/2023, 7:38 AM  I spent around 5 minutes doing patient's preoperative cardiac evaluation.  Prior to patient's phone evaluation I spent greater than 10 minutes reviewing their past medical history and cardiac medications.

## 2023-04-10 DIAGNOSIS — M47816 Spondylosis without myelopathy or radiculopathy, lumbar region: Secondary | ICD-10-CM | POA: Diagnosis not present

## 2023-05-18 DIAGNOSIS — H01004 Unspecified blepharitis left upper eyelid: Secondary | ICD-10-CM | POA: Diagnosis not present

## 2023-05-18 DIAGNOSIS — H01001 Unspecified blepharitis right upper eyelid: Secondary | ICD-10-CM | POA: Diagnosis not present

## 2023-05-22 ENCOUNTER — Ambulatory Visit: Payer: Medicare Other | Admitting: Nurse Practitioner

## 2023-05-29 DIAGNOSIS — M47816 Spondylosis without myelopathy or radiculopathy, lumbar region: Secondary | ICD-10-CM | POA: Diagnosis not present

## 2023-06-01 ENCOUNTER — Ambulatory Visit: Payer: Medicare Other | Admitting: Nurse Practitioner

## 2023-06-01 ENCOUNTER — Encounter: Payer: Self-pay | Admitting: Nurse Practitioner

## 2023-06-01 VITALS — BP 122/66 | HR 61 | Temp 97.7°F | Ht 72.0 in | Wt 242.4 lb

## 2023-06-01 DIAGNOSIS — R011 Cardiac murmur, unspecified: Secondary | ICD-10-CM | POA: Diagnosis not present

## 2023-06-01 DIAGNOSIS — Z79899 Other long term (current) drug therapy: Secondary | ICD-10-CM

## 2023-06-01 DIAGNOSIS — K589 Irritable bowel syndrome without diarrhea: Secondary | ICD-10-CM

## 2023-06-01 DIAGNOSIS — E559 Vitamin D deficiency, unspecified: Secondary | ICD-10-CM | POA: Diagnosis not present

## 2023-06-01 DIAGNOSIS — E782 Mixed hyperlipidemia: Secondary | ICD-10-CM | POA: Diagnosis not present

## 2023-06-01 DIAGNOSIS — R3914 Feeling of incomplete bladder emptying: Secondary | ICD-10-CM

## 2023-06-01 DIAGNOSIS — N401 Enlarged prostate with lower urinary tract symptoms: Secondary | ICD-10-CM

## 2023-06-01 DIAGNOSIS — Z85828 Personal history of other malignant neoplasm of skin: Secondary | ICD-10-CM

## 2023-06-01 DIAGNOSIS — G4700 Insomnia, unspecified: Secondary | ICD-10-CM | POA: Diagnosis not present

## 2023-06-01 DIAGNOSIS — I480 Paroxysmal atrial fibrillation: Secondary | ICD-10-CM | POA: Diagnosis not present

## 2023-06-01 DIAGNOSIS — M545 Low back pain, unspecified: Secondary | ICD-10-CM

## 2023-06-01 DIAGNOSIS — I700 Atherosclerosis of aorta: Secondary | ICD-10-CM | POA: Diagnosis not present

## 2023-06-01 DIAGNOSIS — R7309 Other abnormal glucose: Secondary | ICD-10-CM

## 2023-06-01 DIAGNOSIS — E66811 Obesity, class 1: Secondary | ICD-10-CM

## 2023-06-01 DIAGNOSIS — F419 Anxiety disorder, unspecified: Secondary | ICD-10-CM

## 2023-06-01 DIAGNOSIS — I10 Essential (primary) hypertension: Secondary | ICD-10-CM

## 2023-06-01 DIAGNOSIS — G8929 Other chronic pain: Secondary | ICD-10-CM

## 2023-06-01 MED ORDER — DULOXETINE HCL 20 MG PO CPEP: 20.0000 mg | ORAL_CAPSULE | Freq: Every day | ORAL | 0 refills | Status: DC

## 2023-06-01 NOTE — Progress Notes (Signed)
FOLLOW UP  Assessment:    Essential hypertension Discussed DASH (Dietary Approaches to Stop Hypertension) DASH diet is lower in sodium than a typical American diet. Cut back on foods that are high in saturated fat, cholesterol, and trans fats. Eat more whole-grain foods, fish, poultry, and nuts Remain active and exercise as tolerated daily.  Monitor BP at home-Call if greater than 130/80.  Check CMP/CBC  Paroxysmal atrial fibrillation (HCC) NSR at this time, following Dr. Johney Frame, continue elequis 5mg  BID  Mixed hyperlipidemia Discussed lifestyle modifications. Recommended diet heavy in fruits and veggies, omega 3's. Decrease consumption of animal meats, cheeses, and dairy products. Remain active and exercise as tolerated. Continue to monitor. Check lipids/TSH   Anxiety Continue Lexapro.   Reviewed relaxation techniques.  Sleep hygiene. Recommended Cognitive Behavioral Therapy (CBT). Recommended mindfulness meditation and exercise.   Insight-oriented psychotherapy given for 16 minutes exclusively. Psychoeducation:  encouraged personality growth wand development through coping techniques and problem-solving skills. Limit/Decrease/Monitor drug/alcohol intake.     Heart Murmur Echo scheduled through cardiology Continue current meds and follow with cardiology  Insomnia He is off Xanax and decrease Hydroxyzine to 10 mg 1-2 tabs at bedtime  Aortic atherosclerosis (HCC) Per CT 10/11/17 Control blood pressure, cholesterol, glucose, increase exercise.  Other abnormal glucose Education: Reviewed 'ABCs' of diabetes management  Discussed goals to be met and/or maintained include A1C (<7) Blood pressure (<130/80) Cholesterol (LDL <70) Continue Eye Exam yearly  Continue Dental Exam Q6 mo Discussed dietary recommendations Discussed Physical Activity recommendations Check A1C  Vitamin D deficiency Continue supplement  Benign prostatic hyperplasia with urinary  frequency Continue follow up urology - [name]  Irritable bowel syndrome, unspecified type Diet discussed  Obesity BMI Discussed appropriate BMI Diet modification. Physical activity. Encouraged/praised to build confidence.   History of SCC (squamous cell carcinoma) of skin Follow up DERM   Medication management All medications discussed and reviewed in full. All questions and concerns regarding medications addressed.    Chronic back pain Start Duloxetine 20 mg Continue to monitor  Review of blood work stable - obtain NOV.  Meds ordered this encounter  Medications   DULoxetine (CYMBALTA) 20 MG capsule    Sig: Take 1 capsule (20 mg total) by mouth daily.    Dispense:  90 capsule    Refill:  0    Order Specific Question:   Supervising Provider    Answer:   [name] [phone]    Notify office for further evaluation and treatment, questions or concerns if any reported s/s fail to improve.   The patient was advised to call back or seek an in-person evaluation if any symptoms worsen or if the condition fails to improve as anticipated.   Further disposition pending results of labs. Discussed med's effects and SE's.    I discussed the assessment and treatment plan with the patient. The patient was provided an opportunity to ask questions and all were answered. The patient agreed with the plan and demonstrated an understanding of the instructions.  Discussed med's effects and SE's. Screening labs and tests as requested with regular follow-up as recommended.  I provided 30 minutes of face-to-face time during this encounter including counseling, chart review, and critical decision making was preformed.  Today's Plan of Care is based on a patient-centered health care approach known as shared decision making - the decisions, tests and treatments allow for patient preferences and values to be balanced with clinical evidence.    Future Appointments  Date Time Provider  Department Center  09/08/2023 11:00 AM [name], MD [facility] None  12/06/2023 11:00 AM Adela Glimpse, NP [facility] None     Subjective:  [name] is a 77 y.o. male who presents for a general follow up for HTN, HLD, and vitamin D Def.   He was started on Lexapro 10 mg QD and Hydroxyzine 25 mg 1-2 tabs at bedtime for sleep, off Xanax. He has been taking only a half of the hydroxyzine and doing well but is not able to cut current dose in half so will decrease dosage to 10 mg.  However, he has been experiencing increase in low back pain and right knee pain. Follows [name] -  He is currently pending right knee replacement. Had steroid/NSAID (toradol) injection on 05/29/23 with effectiveness.  Discussed starting low dose Duloxetine with Orthopedist.  Wishes to proceed today for overall pain management.    Of note, saw [name]24/23 for Right Shoulder Reverse 01/06/22.  He has history of Afib He is on tambocor 100 mg BID, verapamil  120 mg QD, he did go back on the Eliquis 5 mg BID. Echocardiogram is scheduled for 12/23/22 for heart murmur found at cardiology 2 days ago.   Follows with [name] for BPH just had OV last week, no changes. Last saw 08/2022  He had 4 weeks of loose bowels so had not yet done cologuard yet.  Symptoms are starting to improve.   [name] , Left hip injection Wednesday and has noticed some improvement of facet joint. He is going to be getting a nerve block in the future.   Had right shoulder replacement 12/2021 and has limited ROM and strength still diminished.  Had to be totally restricted in brace for 6 weeks and had to restart PT- since bone was not growing as needed.    BMI is Body mass index is 32.88 kg/m., he has not been working on diet.  Wt Readings from Last 3 Encounters:  06/01/23 242 lb 6.4 oz (110 kg)  04/05/23 235 lb 3.2 oz (106.7 kg)  02/20/23 245 lb (111.1 kg)   His blood pressure has been controlled at home,  currently on verapamil 120 mg QD, carvedilol 6.25 mg BID and losartan 50 mg QD. Had been taking Valsartan and Losartan by mistake and Valsartan was d/c by cardiology 2 days ago. Today their BP is BP: 122/66  BP Readings from Last 3 Encounters:  06/01/23 122/66  04/05/23 96/62  02/20/23 120/70  He does workout. He denies chest pain, shortness of breath, dizziness.    He is on cholesterol medication, on rosuvastatin 20 mg daily.  Zetia 10 mg QD was started 07/2022. His cholesterol is at goal. The cholesterol last visit was:   Lab Results  Component Value Date   CHOL 151 02/20/2023   HDL 58 02/20/2023   LDLCALC 62 02/20/2023   TRIG 245 (H) 02/20/2023   CHOLHDL 2.6 02/20/2023    Had elevated A1C in 2013, has done well with diet/exercise. Last A1C in the office was:  Lab Results  Component Value Date   HGBA1C 5.7 (H) 02/20/2023   Patient is on Vitamin D supplement.   Lab Results  Component Value Date   VD25OH 45 02/20/2023      Medication Review:  Current Outpatient Medications (Endocrine & Metabolic):    dexamethasone (DECADRON) 4 MG tablet, Take 1 tab 3 x /day for 2 days, then 2 x /day for 2  Days,  then 1 tab daily  Current Outpatient Medications (  Cardiovascular):    carvedilol (COREG) 6.25 MG tablet, Take 1 tablet (6.25 mg total) by mouth 2 (two) times daily.   ezetimibe (ZETIA) 10 MG tablet, Take  1 tablet   Daily  for Cholesterol   flecainide (TAMBOCOR) 100 MG tablet, TAKE 1 TABLET BY MOUTH TWICE  DAILY   losartan (COZAAR) 50 MG tablet, Take 1 tablet (50 mg total) by mouth daily.   rosuvastatin (CRESTOR) 20 MG tablet, Take  1 whole  tablet (20 mg)  Daily for Cholesterol   verapamil (CALAN-SR) 120 MG CR tablet, Take  1 tablet  Daily  with Food  for BP & Heart Rhythm    Current Outpatient Medications (Hematological):    ELIQUIS 5 MG TABS tablet, Take 1 tablet (5 mg total) by mouth 2 (two) times daily.  Current Outpatient Medications (Other):    Cholecalciferol (VITAMIN  D3) 5000 units CAPS, Take 10,000 Units by mouth daily.    escitalopram (LEXAPRO) 20 MG tablet, Take 1/2 tablet (10 mg) daily.   finasteride (PROSCAR) 5 MG tablet, Take 5 mg by mouth daily.   Flaxseed, Linseed, (FLAX SEED OIL PO), Take 1 capsule by mouth daily.   hydrOXYzine (ATARAX) 10 MG tablet, 1-2 tabs at bedtime   Multiple Vitamin (MULTIVITAMIN) tablet, Take 1 tablet by mouth daily.   Omega-3 Fatty Acids (FISH OIL PO), Take 1 capsule by mouth 2 (two) times daily.   traZODone (DESYREL) 150 MG tablet, Take  1/2 to 1 tablet   1 to 2 hours  before Bedtime  as needed for  Sleep                                               /                                                                   TAKE                                         BY                                                 MOUTH   vitamin C (ASCORBIC ACID) 500 MG tablet, Take 500 mg by mouth daily.   zinc gluconate 50 MG tablet, Take 50 mg by mouth daily.  Current Problems (verified) Patient Active Problem List   Diagnosis Date Noted   BPH (benign prostatic hyperplasia) 05/22/2019   Insomnia 08/09/2018   HNP (herniated nucleus pulposus), cervical 07/16/2018   Aortic atherosclerosis (HCC) by Chest CT scan on 10/11/2017 10/11/2017   Arthritis of carpometacarpal [facility]) joint of left thumb 07/27/2017   Chronic pain of left thumb 07/27/2017   History of SCC (squamous cell carcinoma) of skin 10/04/2016   Atrial fibrillation (HCC) 08/01/2016   Obesity (BMI 30.0-34.9) 09/30/2013   Essential hypertension    Hyperlipidemia, mixed    BPH with  obstruction/lower urinary tract symptoms    Abnormal glucose    IBS (irritable bowel syndrome)    Vitamin D deficiency     Screening Tests Immunization History  Administered Date(s) Administered   DTaP 07/11/2005   Influenza Whole 04/02/2013   Influenza, High Dose Seasonal PF 05/01/2014, 05/21/2015, 03/29/2016, 05/31/2017, 05/05/2018, 05/01/2019, 04/19/2021, 04/19/2022   Moderna Sars-Covid-2  Vaccination 08/10/2019, 09/09/2019   Pneumococcal Conjugate-13 05/01/2014   Pneumococcal Polysaccharide-23 09/08/2008, 03/29/2016   Td 12/16/2015   Zoster, Live 12/09/2008   Health Maintenance  Topic Date Due   Zoster Vaccines- Shingrix (1 of 2) [birth year]/28/1965   COVID-19 Vaccine (3 - Moderna risk series) 10/07/2019   INFLUENZA VACCINE  02/09/2023   Medicare Annual Wellness (AWV)  11/25/2023   DTaP/Tdap/Td (3 - Tdap) 12/15/2025   Pneumonia Vaccine 42+ Years old  Completed   Hepatitis C Screening  Completed   HPV VACCINES  Aged Out   Fecal DNA (Cologuard)  Discontinued    Patient Care Team: [name], MD as PCP - General (Internal Medicine) Hillis Range, MD (Inactive) as PCP - Cardiology (Cardiology) Mealor, Roberts Gaudy, MD as PCP - Electrophysiology (Cardiology) Dairl Ponder, MD as Consulting Physician (Orthopedic Surgery) Louis Meckel, MD (Inactive) as Consulting Physician (Gastroenterology) Berneice Heinrich Delbert Phenix., MD as Consulting Physician (Urology) Hillis Range, MD (Inactive) as Consulting Physician (Cardiology)   History reviewed: allergies, current medications, past family history, past medical history, past social history, past surgical history and problem list  Allergies Allergies  Allergen Reactions   Xarelto [Rivaroxaban] Other (See Comments)    Severe body aches   Imdur [Isosorbide Nitrate] Other (See Comments)    Severe Headaches   Atorvastatin Other (See Comments)    Myalgias   Clonazepam Other (See Comments)    "Made me jittery"   Tamsulosin Hcl Other (See Comments)    Hypotension    SURGICAL HISTORY He  has a past surgical history that includes Appendectomy (02-07-2007); Anterior cervical decomp/discectomy fusion (01-25-2002); LEFT INGUINAL HERNIA REPAIR (1970); Umbilical hernia repair (2003); LEFT ROTATOR CUFF REPAIR (2002); Anterior cruciate ligament repair (1990); Cervical fusion (2000); LEFT ANKLE SURG. (1980); Hip surgery (1999);  Colonoscopy (05-30-2011); Tonsillectomy; Trigger finger release (Right, 07/17/2013); Tendon reconstruction (Left, 07/17/2013); LOOP RECORDER INSERTION (N/A, 09/19/2016); Hernia repair; Anterior cervical decomp/discectomy fusion (Bilateral, 07/16/2018); Carpal tunnel release (Right, 07/16/2018); XI robotic assisted simple prostatectomy (N/A, 05/22/2019); Cataract extraction (Right, 01/2020); and Reverse shoulder arthroplasty (Right, 01/06/2022). FAMILY HISTORY His family history includes Alcohol abuse in his father; Diabetes in his father; Heart attack (age of onset: 79) in his father; Heart disease in his father and mother; Stroke (age of onset: 17) in his mother. SOCIAL HISTORY He  reports that he has never smoked. He has never used smokeless tobacco. He reports current alcohol use of about 4.0 standard drinks of alcohol per week. He reports that he does not use drugs.  Objective:   Blood pressure 122/66, pulse 61, temperature 97.7 F (36.5 C), height 6' (1.829 m), weight 242 lb 6.4 oz (110 kg), SpO2 99%. Body mass index is 32.88 kg/m.  General appearance: alert, no distress, WD/WN, male HEENT: normocephalic, sclerae anicteric, TMs pearly, nares patent, no discharge or erythema, pharynx normal Oral cavity: MMM, no lesions Neck: supple, no lymphadenopathy, no thyromegaly, no masses Heart: RRR, normal S1, S2, no murmurs Lungs: CTA bilaterally, no wheezes, rhonchi, or rales Abdomen: +bs, soft, non tender, non distended, no masses, no hepatomegaly, no splenomegaly Musculoskeletal: nontender, right knee with obvious bony enlargement no effusion, no  erythema. Extremities: no edema, no cyanosis, no clubbing Pulses: 2+ symmetric, upper and lower extremities, normal cap refill Neurological: alert, oriented x 3, CN2-12 intact, strength normal upper extremities and lower extremities, sensation normal throughout, DTRs 2+ throughout, no cerebellar signs, gait antalgic Skin: warm and dry , ecchymoses of  arms Psychiatric: normal affect, behavior normal, pleasant   Breyonna Nault, NP   06/01/2023

## 2023-06-01 NOTE — Patient Instructions (Signed)
Duloxetine Delayed-Release Capsules What is this medication? DULOXETINE (doo LOX e teen) treats depression, anxiety, fibromyalgia, and certain types of chronic pain such as nerve, bone, or joint pain. It increases the amount of serotonin and norepinephrine in the brain, hormones that help regulate mood and pain. It belongs to a group of medications called SNRIs. This medicine may be used for other purposes; ask your health care provider or pharmacist if you have questions. COMMON BRAND NAME(S): Cymbalta, Drizalma, Irenka What should I tell my care team before I take this medication? They need to know if you have any of these conditions: Bipolar disorder Glaucoma High blood pressure Kidney disease Liver disease Seizures Suicidal thoughts, plans, or attempt by you or a family member Take medications that treat or prevent blood clots Taken an MAOI, such as Carbex, Eldepryl, Marplan, Nardil, or Parnate in the last 14 days Trouble passing urine An unusual reaction to duloxetine, other medications, foods, dyes, or preservatives Pregnant or trying to get pregnant Breastfeeding How should I use this medication? Take this medication by mouth with water. Take it as directed on the prescription label at the same time every day. Do not cut, crush, or chew this medication. Swallow the capsules whole. Some capsules may be opened and sprinkled on applesauce. Check with your care team or pharmacist if you are not sure. You can take this medication with or without food. Do not take your medication more often than directed. Do not stop taking this medication suddenly except upon the advice of your care team. Stopping this medication too quickly may cause serious side effects or your condition may worsen. A special MedGuide will be given to you by the pharmacist with each prescription and refill. Be sure to read this information carefully each time. Talk to your care team about the use of this medication in  children. While it may be prescribed for children as young as 7 years for selected conditions, precautions do apply. Overdosage: If you think you have taken too much of this medicine contact a poison control center or emergency room at once. NOTE: This medicine is only for you. Do not share this medicine with others. What if I miss a dose? If you miss a dose, take it as soon as you can. If it is almost time for your next dose, take only that dose. Do not take double or extra doses. What may interact with this medication? Do not take this medication with any of the following: Desvenlafaxine Levomilnacipran Linezolid MAOIs, such as Carbex, Eldepryl, Emsam, Marplan, Nardil, and Parnate Methylene blue (injected into a vein) Milnacipran Safinamide Thioridazine Venlafaxine Viloxazine This medication may also interact with the following: Alcohol Amphetamines Aspirin and aspirin-like medications Certain antibiotics, such as ciprofloxacin and enoxacin Certain medications for blood pressure, heart disease, irregular heart beat Certain medications for mental health conditions Certain medications for migraine headache, such as almotriptan, eletriptan, frovatriptan, naratriptan, rizatriptan, sumatriptan, zolmitriptan Certain medications that treat or prevent blood clots, such as warfarin, enoxaparin, and dalteparin Cimetidine Fentanyl Lithium NSAIDS, medications for pain and inflammation, such as ibuprofen or naproxen Phentermine Procarbazine Rasagiline Sibutramine St. John's Wort Theophylline Tramadol Tryptophan This list may not describe all possible interactions. Give your health care provider a list of all the medicines, herbs, non-prescription drugs, or dietary supplements you use. Also tell them if you smoke, drink alcohol, or use illegal drugs. Some items may interact with your medicine. What should I watch for while using this medication? Tell your care team if your  symptoms do not  get better or if they get worse. Visit your care team for regular checks on your progress. Because it may take several weeks to see the full effects of this medication, it is important to continue your treatment as prescribed by your care team. This medication may cause serious skin reactions. They can happen weeks to months after starting the medication. Contact your care team right away if you notice fevers or flu-like symptoms with a rash. The rash may be red or purple and then turn into blisters or peeling of the skin. You may also notice a red rash with swelling of the face, lips, or lymph nodes in your neck or under your arms. Watch for new or worsening thoughts of suicide or depression. This includes sudden changes in mood, behaviors, or thoughts. These changes can happen at any time but are more common in the beginning of treatment or after a change in dose. Call your care team right away if you experience these thoughts or worsening depression. This medication may cause mood and behavior changes, such as anxiety, nervousness, irritability, hostility, restlessness, excitability, hyperactivity, or trouble sleeping. These changes can happen at any time but are more common in the beginning of treatment or after a change in dose. Call your care team right away if you notice any of these symptoms. This medication may affect your coordination, reaction time, or judgment. Do not drive or operate machinery until you know how this medication affects you. Sit up or stand slowly to reduce the risk of dizzy or fainting spells. Drinking alcohol with this medication can increase the risk of these side effects. This medication may increase blood sugar. The risk may be higher in patients who already have diabetes. Ask your care team what you can do to lower your risk of diabetes while taking this medication. This medication can cause an increase in blood pressure. This medication can also cause a sudden drop in your  blood pressure, which may make you feel faint and increase the chance of a fall. These effects are most common when you first start the medication or when the dose is increased, or during use of other medications that can cause a sudden drop in blood pressure. Check with your care team for instructions on monitoring your blood pressure while taking this medication. Your mouth may get dry. Chewing sugarless gum or sucking hard candy and drinking plenty of water may help. Contact your care team if the problem does not go away or is severe. What side effects may I notice from receiving this medication? Side effects that you should report to your care team as soon as possible: Allergic reactions--skin rash, itching, hives, swelling of the face, lips, tongue, or throat Bleeding--bloody or black, tar-like stools, red or dark brown urine, vomiting blood or brown material that looks like coffee grounds, small, red or purple spots on skin, unusual bleeding or bruising Increase in blood pressure Liver injury--right upper belly pain, loss of appetite, nausea, light-colored stool, dark yellow or brown urine, yellowing skin or eyes, unusual weakness or fatigue Low sodium level--muscle weakness, fatigue, dizziness, headache, confusion Redness, blistering, peeling, or loosening of the skin, including inside the mouth Serotonin syndrome--irritability, confusion, fast or irregular heartbeat, muscle stiffness, twitching muscles, sweating, high fever, seizures, chills, vomiting, diarrhea Sudden eye pain or change in vision such as blurry vision, seeing halos around lights, vision loss Thoughts of suicide or self-harm, worsening mood, feelings of depression Trouble passing urine Side effects that  usually do not require medical attention (report to your care team if they continue or are bothersome): Change in sex drive or performance Constipation Diarrhea Dizziness Dry mouth Excessive sweating Loss of  appetite Nausea Vomiting This list may not describe all possible side effects. Call your doctor for medical advice about side effects. You may report side effects to FDA at 1-800-FDA-1088. Where should I keep my medication? Keep out of the reach of children and pets. Store at room temperature between 15 and 30 degrees C (59 to 86 degrees F). Get rid of any unused medication after the expiration date. To get rid of medications that are no longer needed or have expired: Take the medication to a medication take-back program. Check with your pharmacy or law enforcement to find a location. If you cannot return the medication, check the label or package insert to see if the medication should be thrown out in the garbage or flushed down the toilet. If you are not sure, ask your care team. If it is safe to put it in the trash, take the medication out of the container. Mix the medication with cat litter, dirt, coffee grounds, or other unwanted substance. Seal the mixture in a bag or container. Put it in the trash. NOTE: This sheet is a summary. It may not cover all possible information. If you have questions about this medicine, talk to your doctor, pharmacist, or health care provider.  2024 Elsevier/Gold Standard (2022-03-31 00:00:00)

## 2023-06-22 DIAGNOSIS — Z1212 Encounter for screening for malignant neoplasm of rectum: Secondary | ICD-10-CM | POA: Diagnosis not present

## 2023-06-22 DIAGNOSIS — Z1211 Encounter for screening for malignant neoplasm of colon: Secondary | ICD-10-CM | POA: Diagnosis not present

## 2023-06-28 ENCOUNTER — Encounter: Payer: Self-pay | Admitting: Nurse Practitioner

## 2023-06-28 ENCOUNTER — Ambulatory Visit (INDEPENDENT_AMBULATORY_CARE_PROVIDER_SITE_OTHER): Payer: Medicare Other | Admitting: Nurse Practitioner

## 2023-06-28 VITALS — BP 122/72 | HR 65 | Temp 97.8°F | Ht 72.0 in | Wt 239.2 lb

## 2023-06-28 DIAGNOSIS — I10 Essential (primary) hypertension: Secondary | ICD-10-CM | POA: Diagnosis not present

## 2023-06-28 DIAGNOSIS — Z01812 Encounter for preprocedural laboratory examination: Secondary | ICD-10-CM

## 2023-06-28 DIAGNOSIS — I480 Paroxysmal atrial fibrillation: Secondary | ICD-10-CM

## 2023-06-28 NOTE — Progress Notes (Signed)
Assessment and Plan:  [name] was seen today for an episodic.  Diagnoses and all order for this visit:  1. Pre-procedural laboratory examinations Patient defers updated lab work - states he will obtain at CPE 08/2022.  2. Essential hypertension (Primary) Continue Verapamil, Losartan, Carvedilol Discussed DASH (Dietary Approaches to Stop Hypertension) DASH diet is lower in sodium than a typical American diet. Cut back on foods that are high in saturated fat, cholesterol, and trans fats. Eat more whole-grain foods, fish, poultry, and nuts Remain active and exercise as tolerated daily.  Monitor BP at home-Call if greater than 130/80.   - EKG 12-Lead  3. Paroxysmal atrial fibrillation (HCC) NSR at this time, following Dr. Johney Frame, continue elequis 5mg  BID   - EKG 12-Lead   Notify office for further evaluation and treatment, questions or concerns if s/s fail to improve. The risks and benefits of my recommendations, as well as other treatment options were discussed with the patient today. Questions were answered.  Further disposition pending results of labs. Discussed med's effects and SE's.    Over 20 minutes of exam, counseling, chart review, and critical decision making was performed.   Future Appointments  Date Time Provider Department Center  09/08/2023 11:00 AM [name], MD [facility] None  12/06/2023 11:00 AM Adela Glimpse, NP [facility] None    ------------------------------------------------------------------------------------------------------------------   HPI BP 122/72   Pulse 65   Temp 97.8 F (36.6 C)   Ht 6' (1.829 m)   Wt 239 lb 3.2 oz (108.5 kg)   SpO2 97%   BMI 32.44 kg/m   77 y.o.male presents for discussion of surgical clearance for right knee replacement with [name] that has not yet been scheduled.  He was hopeful to have the surgery scheduled and completed by the end of the year, however, his wife is experiencing several  health issues that currently needs his attention.  He has an annual CPE 08/2022 and wishes to complete blood work at that time.    Past Medical History:  Diagnosis Date   A-fib (HCC) 2018   on eliquis   Anxiety    Arthritis    Benign prostatic hyperplasia    BPH (benign prostatic hypertrophy)    Carpal tunnel syndrome, bilateral    Early cataract    GERD (gastroesophageal reflux disease)    History of SCC (squamous cell carcinoma) of skin 10/04/2016   HNP (herniated nucleus pulposus), cervical    Hyperlipidemia    Hypertension 2007   IBS (irritable bowel syndrome)    Obesity    Other abnormal glucose    Pneumonia    " walking"   Pre-diabetes    Vitamin D deficiency    Wears glasses      Allergies  Allergen Reactions   Xarelto [Rivaroxaban] Other (See Comments)    Severe body aches   Imdur [Isosorbide Nitrate] Other (See Comments)    Severe Headaches   Atorvastatin Other (See Comments)    Myalgias   Clonazepam Other (See Comments)    "Made me jittery"   Tamsulosin Hcl Other (See Comments)    Hypotension    Current Outpatient Medications on File Prior to Visit  Medication Sig   carvedilol (COREG) 6.25 MG tablet Take 1 tablet (6.25 mg total) by mouth 2 (two) times daily.   Cholecalciferol (VITAMIN D3) 5000 units CAPS Take 10,000 Units by mouth daily.    DULoxetine (CYMBALTA) 20 MG capsule Take 1 capsule (20 mg total) by mouth daily.   ELIQUIS  5 MG TABS tablet Take 1 tablet (5 mg total) by mouth 2 (two) times daily.   escitalopram (LEXAPRO) 20 MG tablet Take 1/2 tablet (10 mg) daily.   ezetimibe (ZETIA) 10 MG tablet Take  1 tablet   Daily  for Cholesterol   finasteride (PROSCAR) 5 MG tablet Take 5 mg by mouth daily.   Flaxseed, Linseed, (FLAX SEED OIL PO) Take 1 capsule by mouth daily.   flecainide (TAMBOCOR) 100 MG tablet TAKE 1 TABLET BY MOUTH TWICE  DAILY   hydrOXYzine (ATARAX) 10 MG tablet 1-2 tabs at bedtime   losartan (COZAAR) 50 MG tablet Take 1 tablet (50 mg  total) by mouth daily.   Multiple Vitamin (MULTIVITAMIN) tablet Take 1 tablet by mouth daily.   Omega-3 Fatty Acids (FISH OIL PO) Take 1 capsule by mouth 2 (two) times daily.   rosuvastatin (CRESTOR) 20 MG tablet Take  1 whole  tablet (20 mg)  Daily for Cholesterol   traZODone (DESYREL) 150 MG tablet Take  1/2 to 1 tablet   1 to 2 hours  before Bedtime  as needed for  Sleep                                               /                                                                   TAKE                                         BY                                                 MOUTH   verapamil (CALAN-SR) 120 MG CR tablet Take  1 tablet  Daily  with Food  for BP & Heart Rhythm   vitamin C (ASCORBIC ACID) 500 MG tablet Take 500 mg by mouth daily.   zinc gluconate 50 MG tablet Take 50 mg by mouth daily.   No current facility-administered medications on file prior to visit.    ROS: all negative except what is noted in the HPI.   Physical Exam:  BP 122/72   Pulse 65   Temp 97.8 F (36.6 C)   Ht 6' (1.829 m)   Wt 239 lb 3.2 oz (108.5 kg)   SpO2 97%   BMI 32.44 kg/m   General Appearance: NAD.  Awake, conversant and cooperative. Eyes: PERRLA, EOMs intact.  Sclera white.  Conjunctiva without erythema. Sinuses: No frontal/maxillary tenderness.  No nasal discharge. Nares patent.  ENT/Mouth: Ext aud canals clear.  Bilateral TMs w/DOL and without erythema or bulging. Hearing intact.  Posterior pharynx without swelling or exudate.  Tonsils without swelling or erythema.  Neck: Supple.  No masses, nodules or thyromegaly. Respiratory: Effort is regular with non-labored breathing. Breath sounds are equal bilaterally without rales, rhonchi, wheezing or  stridor.  Cardio: RRR with no MRGs. Brisk peripheral pulses without edema.  Abdomen: Active BS in all four quadrants.  Soft and non-tender without guarding, rebound tenderness, hernias or masses. Lymphatics: Non tender without lymphadenopathy.   Musculoskeletal: Full ROM, 5/5 strength, normal ambulation.  No clubbing or cyanosis. Skin: Appropriate color for ethnicity. Warm without rashes, lesions, ecchymosis, ulcers.  Neuro: CN II-XII grossly normal. Normal muscle tone without cerebellar symptoms and intact sensation.   Psych: AO X 3,  appropriate mood and affect, insight and judgment.    EKG:  NSR  Adela Glimpse, NP 12:16 PM [facility]

## 2023-06-28 NOTE — Patient Instructions (Signed)

## 2023-07-02 LAB — COLOGUARD: COLOGUARD: NEGATIVE

## 2023-07-02 NOTE — Progress Notes (Signed)
[][][][][][][][][][][][][][][][][][][][][][][][][][][][][][][][][][][][][][][][][]][][][][][][][][][][][][][][][][][][][][][][][[][][][][]  -   Cologard returned Negative  - Great !                                    Recommend repeat in 3 years - December 2027  [] [] [] [] [] [] [] [] [] [] [] [] [] [] [] [] [] [] [] [] [] [] [] [] [] [] [] [] [] [] [] [] [] [] [] [] [] [] [] [] [] ][] [] [] [] [] [] [] [] [] [] [] [] [] [] [] [] [] [] [] [] [] [] [[] [] [] [] []

## 2023-07-18 DIAGNOSIS — M47816 Spondylosis without myelopathy or radiculopathy, lumbar region: Secondary | ICD-10-CM | POA: Diagnosis not present

## 2023-08-02 DIAGNOSIS — U071 COVID-19: Secondary | ICD-10-CM | POA: Diagnosis not present

## 2023-08-02 DIAGNOSIS — R059 Cough, unspecified: Secondary | ICD-10-CM | POA: Diagnosis not present

## 2023-08-02 DIAGNOSIS — Z20822 Contact with and (suspected) exposure to covid-19: Secondary | ICD-10-CM | POA: Diagnosis not present

## 2023-08-09 ENCOUNTER — Encounter: Payer: Medicare Other | Admitting: Internal Medicine

## 2023-08-22 ENCOUNTER — Encounter: Payer: Medicare Other | Admitting: Internal Medicine

## 2023-08-28 ENCOUNTER — Other Ambulatory Visit: Payer: Self-pay

## 2023-08-28 DIAGNOSIS — H01004 Unspecified blepharitis left upper eyelid: Secondary | ICD-10-CM | POA: Diagnosis not present

## 2023-08-28 DIAGNOSIS — H26493 Other secondary cataract, bilateral: Secondary | ICD-10-CM | POA: Diagnosis not present

## 2023-08-28 DIAGNOSIS — H53001 Unspecified amblyopia, right eye: Secondary | ICD-10-CM | POA: Diagnosis not present

## 2023-08-28 DIAGNOSIS — H524 Presbyopia: Secondary | ICD-10-CM | POA: Diagnosis not present

## 2023-08-28 DIAGNOSIS — Z961 Presence of intraocular lens: Secondary | ICD-10-CM | POA: Diagnosis not present

## 2023-08-28 DIAGNOSIS — H01001 Unspecified blepharitis right upper eyelid: Secondary | ICD-10-CM | POA: Diagnosis not present

## 2023-08-28 MED ORDER — DULOXETINE HCL 20 MG PO CPEP: 20.0000 mg | ORAL_CAPSULE | Freq: Every day | ORAL | 0 refills | Status: DC

## 2023-08-29 DIAGNOSIS — R310 Gross hematuria: Secondary | ICD-10-CM | POA: Diagnosis not present

## 2023-08-29 DIAGNOSIS — N5201 Erectile dysfunction due to arterial insufficiency: Secondary | ICD-10-CM | POA: Diagnosis not present

## 2023-08-29 DIAGNOSIS — R3915 Urgency of urination: Secondary | ICD-10-CM | POA: Diagnosis not present

## 2023-09-07 DIAGNOSIS — E139 Other specified diabetes mellitus without complications: Secondary | ICD-10-CM | POA: Diagnosis not present

## 2023-09-07 DIAGNOSIS — M1711 Unilateral primary osteoarthritis, right knee: Secondary | ICD-10-CM | POA: Diagnosis not present

## 2023-09-07 DIAGNOSIS — R600 Localized edema: Secondary | ICD-10-CM | POA: Diagnosis not present

## 2023-09-08 ENCOUNTER — Encounter: Payer: Medicare Other | Admitting: Internal Medicine

## 2023-09-11 ENCOUNTER — Encounter: Payer: Medicare Other | Admitting: Nurse Practitioner

## 2023-09-12 ENCOUNTER — Other Ambulatory Visit: Payer: Self-pay

## 2023-09-12 DIAGNOSIS — I10 Essential (primary) hypertension: Secondary | ICD-10-CM

## 2023-09-12 DIAGNOSIS — I480 Paroxysmal atrial fibrillation: Secondary | ICD-10-CM

## 2023-09-12 MED ORDER — VERAPAMIL HCL ER 120 MG PO TBCR: EXTENDED_RELEASE_TABLET | ORAL | 0 refills | Status: AC

## 2023-09-14 DIAGNOSIS — D2272 Melanocytic nevi of left lower limb, including hip: Secondary | ICD-10-CM | POA: Diagnosis not present

## 2023-09-14 DIAGNOSIS — L821 Other seborrheic keratosis: Secondary | ICD-10-CM | POA: Diagnosis not present

## 2023-09-14 DIAGNOSIS — D485 Neoplasm of uncertain behavior of skin: Secondary | ICD-10-CM | POA: Diagnosis not present

## 2023-09-14 DIAGNOSIS — L720 Epidermal cyst: Secondary | ICD-10-CM | POA: Diagnosis not present

## 2023-09-14 DIAGNOSIS — D1801 Hemangioma of skin and subcutaneous tissue: Secondary | ICD-10-CM | POA: Diagnosis not present

## 2023-09-14 DIAGNOSIS — Z85828 Personal history of other malignant neoplasm of skin: Secondary | ICD-10-CM | POA: Diagnosis not present

## 2023-09-14 DIAGNOSIS — D2271 Melanocytic nevi of right lower limb, including hip: Secondary | ICD-10-CM | POA: Diagnosis not present

## 2023-09-14 DIAGNOSIS — L570 Actinic keratosis: Secondary | ICD-10-CM | POA: Diagnosis not present

## 2023-09-14 DIAGNOSIS — D0439 Carcinoma in situ of skin of other parts of face: Secondary | ICD-10-CM | POA: Diagnosis not present

## 2023-09-19 ENCOUNTER — Telehealth: Payer: Self-pay | Admitting: Student

## 2023-09-19 NOTE — Telephone Encounter (Signed)
 Patient called again to follow-up with RN Danford Bad.

## 2023-09-19 NOTE — Telephone Encounter (Signed)
 Patient states he is scheduled for knee replacement surgery on 11/27/23. He had pre-op visit with clearance back in September 2024, but he had to cancel and postpone the surgery at that time.  Patient is ready to move forward and has a clearance form he needs to fax to our office. Provided office fax number [phone]. Patient is seeing his new PCP tomorrow and will request their office fax form to our office.  Patient also states he has a pre-op visit on 11/01/23 in the afternoon in [place] and will not be able to make the visit scheduled with Otilio Saber, PA-C on 11/02/23.  Patient would like to know if he could complete this visit via MyChart video visit or telephone visit. He states nothing has changed since his visit in September and he is doing well.   Will send to Pre-op team to address incoming clearance request (to be faxed to [place] 09/20/23) and preop clearance office visit.

## 2023-09-19 NOTE — Telephone Encounter (Signed)
 Please create formal surgical clearance request and send back to preop pool. Tereso Newcomer, PA-C    09/19/2023 4:33 PM

## 2023-09-19 NOTE — Telephone Encounter (Signed)
 Pt is requesting a callback regarding him wanting to know if his upcoming appt can be tele appt due to him being out of town for a pre surgery meeting that day but he really needs the appt for his knee surgery on 11/17/23. Please advise

## 2023-09-20 DIAGNOSIS — Z981 Arthrodesis status: Secondary | ICD-10-CM | POA: Insufficient documentation

## 2023-09-20 DIAGNOSIS — E782 Mixed hyperlipidemia: Secondary | ICD-10-CM | POA: Diagnosis not present

## 2023-09-20 DIAGNOSIS — I10 Essential (primary) hypertension: Secondary | ICD-10-CM | POA: Diagnosis not present

## 2023-09-20 DIAGNOSIS — I480 Paroxysmal atrial fibrillation: Secondary | ICD-10-CM | POA: Diagnosis not present

## 2023-09-20 DIAGNOSIS — Z0000 Encounter for general adult medical examination without abnormal findings: Secondary | ICD-10-CM | POA: Diagnosis not present

## 2023-09-20 DIAGNOSIS — N138 Other obstructive and reflux uropathy: Secondary | ICD-10-CM | POA: Diagnosis not present

## 2023-09-20 DIAGNOSIS — N401 Enlarged prostate with lower urinary tract symptoms: Secondary | ICD-10-CM | POA: Diagnosis not present

## 2023-09-21 NOTE — Telephone Encounter (Signed)
 PCP faxed over the request that they had sent to them. I am calling the surgeon's office to confirm information on form. I am seeing on the form looks just to be asking for device clearance. I confirmed they will need cardiac/medical clearance as well. I was then sent to surgery scheduler vm. I left a detailed vm for the surgery schedule that I will need a form faxed to [phone] attn: preop team with complete information;   Procedure Anesthesia Meds to be held (blood thinners) Fax and phone # Name of surgeon Date of surgery.

## 2023-09-21 NOTE — Telephone Encounter (Signed)
 I s/w the pt and he tells me that the PCP Dr. Jillyn Hidden has the clearance form and he is waiting for lab work to come in that he did on the pt. Pt said once PCP has the labs he will have send clearance form to cardiology for our notes. Pt also asked if he can do tele appt as he is leaving to be down in [place] as of tomorrow which is where he will be having his surgery. Surgeon office is [facility], surgeon is Dr. Carlena Sax. Phone # for surgeon office on website [phone]/FAX# [phone].    As far as the request if he can do tele instead I stated to the pt that the preop APP will need to decide that.    I assured the pt that we will keep an eye out for the request to come to our office.

## 2023-09-25 NOTE — Telephone Encounter (Signed)
 I will fax notes to surgeon office again as we will need a clearance request faxed to our office [phone] attn: preop team. We will need complete information for preop clearance before we can proceed to our preop team from a cardiology stand point.

## 2023-09-27 ENCOUNTER — Telehealth: Payer: Self-pay

## 2023-09-27 NOTE — Telephone Encounter (Signed)
 Clearance request is in the chart with preop for review.

## 2023-09-27 NOTE — Telephone Encounter (Signed)
   Pre-operative Risk Assessment    Patient Name: [name]  DOB: 11/09/1945 MRN: [id number]   Date of last office visit: 11/23/22 York Pellant MD Date of next office visit: 11/02/23 Maxine Glenn, PA   Request for Surgical Clearance    Procedure:   RIGHT TOTAL KNEE ARTHROPLASTY  Date of Surgery:  Clearance 11/27/23                                Surgeon:  Mearl Latin, MD Surgeon's Group or Practice Name:  [facility] Phone number:  [phone] Fax number:  [phone] ATTN: MADI ROYAL   Type of Clearance Requested:   - Medical  - Pharmacy:  Hold Apixaban (Eliquis)     Type of Anesthesia:  Not Indicated   Additional requests/questions:    SignedMarlow Baars   09/27/2023, 9:28 AM

## 2023-09-28 NOTE — Telephone Encounter (Signed)
 Patient with diagnosis of A Fib on Eliquis for anticoagulation.    Procedure: RIGHT TOTAL KNEE ARTHROPLASTY  Date of procedure: 11/27/23   CHA2DS2-VASc Score = 4  This indicates a 4.8% annual risk of stroke. The patient's score is based upon: CHF History: 0 HTN History: 1 Diabetes History: 0 Stroke History: 0 Vascular Disease History: 1 Age Score: 2 Gender Score: 0  CrCl 101 ml/min Platelet count 315K   Per office protocol, patient can hold Eliquis for 3 days prior to procedure.   **This guidance is not considered finalized until pre-operative APP has relayed final recommendations.**

## 2023-09-28 NOTE — Telephone Encounter (Signed)
I will update all parties involved. See notes from pre op APP Edd Fabian, FNP.

## 2023-09-28 NOTE — Telephone Encounter (Signed)
 Preoperative team patient has upcoming appointment with Otilio Saber, PA-C in April.  Please add preoperative cardiac evaluation to appointment notes.  I will defer cardiovascular exam to upcoming appointment.  Pharmacy has provided recommendations for holding anticoagulation.    Thank you for your help.  Thomasene Ripple. Corry Ihnen NP-C     09/28/2023, 1:31 PM [facility] [address] Office [phone] Fax [phone]

## 2023-09-29 DIAGNOSIS — R600 Localized edema: Secondary | ICD-10-CM | POA: Diagnosis not present

## 2023-09-29 DIAGNOSIS — Z01812 Encounter for preprocedural laboratory examination: Secondary | ICD-10-CM | POA: Diagnosis not present

## 2023-09-29 DIAGNOSIS — E139 Other specified diabetes mellitus without complications: Secondary | ICD-10-CM | POA: Diagnosis not present

## 2023-09-29 DIAGNOSIS — Z0189 Encounter for other specified special examinations: Secondary | ICD-10-CM | POA: Diagnosis not present

## 2023-09-29 NOTE — Telephone Encounter (Signed)
 I s/w the pt and he has moved his appt up sooner in office for preop clearance. Pt is trying to get his surgery cleared ASAP. Pt is down in [place] where his surgery be taking place. Pt wanted to know if he could tele appt. I reviewed the protocol as the last time since he was seen in the office was 11/2022 this would then require him to be seen in the office. Pt is agreeable and asked if we could move appt up sooner. I was able to move appt to 10/19/23 with Francis Dowse, PAC. I stated the pt who agreed to this as well that we will not cancel the appt with Otilio Saber, [facility], will let this be decided by Francis Dowse, PAC.    I will update all parties involved.

## 2023-09-29 NOTE — Telephone Encounter (Signed)
Patient is asking for a call back. Please advise 

## 2023-10-09 DIAGNOSIS — M1711 Unilateral primary osteoarthritis, right knee: Secondary | ICD-10-CM | POA: Diagnosis not present

## 2023-10-18 NOTE — Progress Notes (Unsigned)
 Cardiology Office Note:  .   Date:  10/18/2023  ID:  [name], DOB 1945-12-05, MRN [id number] PCP: Johnathan Peace Chales Salmon, MD  [facility] Providers Cardiologist:  Johnathan Range, MD (Inactive) Electrophysiologist:  Johnathan Small, MD {  History of Present Illness: [name] is a 78 y.o. male w/PMHx of  HTN, HLD, GERD, IBS Afib  He saw Dr. Nelly Perez 11/23/22, reported progressive fatigue and DOE, no palpitations to suggest AFib Stable EKG, murmur on exam > planned for an echo  Echo result note: Heart appears largely normal in structure and function. Nothing evident on the echocardiogram to explain fatigue and shortness of breath   Pending right TKA on 11/27/23  Today's visit is scheduled as a pre-op visit RCRI score is zero = 0.4% DUKE > 8.23METS  ROS:   Outside of his knee doing well; No ongoing SOB Denies DOE Has a new puppy, keeping him active Denies rest SOB, no DOE, CP, no difficulties with ADLs No near syncope or syncope No symptoms of AFib No bleeding/signs of bleeding  Device information MDT ILR implanted 2018 >>> EOS  Arrhythmia/AAD hx AFib found 2018 Flecainide started 2019  Studies Reviewed: Marland Kitchen    EKG done today and reviewed by myself:  SR 82bpm, PR (stable in review of last year's EKGs), QRS , QTc   12/23/2022: TTE 1. Left ventricular ejection fraction, by estimation, is 55 to 60%. The  left ventricle has normal function. The left ventricle has no regional  wall motion abnormalities. There is mild asymmetric left ventricular  hypertrophy of the basal-septal segment.  Left ventricular diastolic parameters are consistent with Grade II  diastolic dysfunction (pseudonormalization).   2. Right ventricular systolic function is normal. The right ventricular  size is normal. There is normal pulmonary artery systolic pressure.   3. Left atrial size was moderately dilated.   4. Right atrial size was mild to moderately  dilated.   5. The mitral valve is degenerative. Mild mitral valve regurgitation. No  evidence of mitral stenosis.   6. The aortic valve is tricuspid. There is moderate calcification of the  aortic valve. Aortic valve regurgitation is trivial. Mild aortic valve  stenosis.    12/14/2016: stress myoview Nuclear stress EF: 54%. There was no ST segment deviation noted during stress. This is a low risk study. The left ventricular ejection fraction is mildly decreased (45-54%).   1. EF 54%, normal wall motion.  2. Fixed medium-sized, mild basal to mid inferolateral and inferior perfusion defect.  Given normal inferior wall motion, think most likely diaphragmatic attenuation.  No ischemia.    Low risk study.   Risk Assessment/Calculations:    Physical Exam:   VS:  There were no vitals taken for this visit.   Wt Readings from Last 3 Encounters:  06/28/23 239 lb 3.2 oz (108.5 kg)  06/01/23 242 lb 6.4 oz (110 kg)  04/05/23 235 lb 3.2 oz (106.7 kg)    GEN: Well nourished, well developed in no acute distress NECK: No JVD; No carotid bruits CARDIAC: RRR, no murmurs, rubs, gallops RESPIRATORY:  CTA b/l without rales, wheezing or rhonchi  ABDOMEN: Soft, non-tender, non-distended EXTREMITIES: No edema; No deformity   ILR site: is stable, no thinning, fluctuation, tethering  ASSESSMENT AND PLAN: .    paroxysmal AFib CHA2DS2Vasc is 3, on eliquis,  appropriately dosed Flecainide w/coreg/verapamil, stable intervals no burden by symptoms  Secondary hypercoagulable state 2/2 AFib  3.   DOE  Resolved No limitations  4.   Pre-op Knee surgery considered low cardiac risk surgery RCRI score is low (zero) 0.4% OK to old Eliquis 3 days ahead > resume as soon as safe post op   Dispo: back in 14mo, sooner if needed    Signed, Sheilah Pigeon, PA-C

## 2023-10-19 ENCOUNTER — Ambulatory Visit: Attending: Physician Assistant | Admitting: Physician Assistant

## 2023-10-19 ENCOUNTER — Encounter: Payer: Self-pay | Admitting: Physician Assistant

## 2023-10-19 VITALS — BP 112/68 | HR 82 | Ht 72.0 in | Wt 235.8 lb

## 2023-10-19 DIAGNOSIS — I480 Paroxysmal atrial fibrillation: Secondary | ICD-10-CM | POA: Diagnosis not present

## 2023-10-19 DIAGNOSIS — Z01810 Encounter for preprocedural cardiovascular examination: Secondary | ICD-10-CM

## 2023-10-19 DIAGNOSIS — D6869 Other thrombophilia: Secondary | ICD-10-CM | POA: Diagnosis not present

## 2023-10-19 NOTE — Patient Instructions (Signed)
 Medication Instructions:   Your physician recommends that you continue on your current medications as directed. Please refer to the Current Medication list given to you today.   *If you need a refill on your cardiac medications before your next appointment, please call your pharmacy*  Lab Work: NONE ORDERED  TODAY   If you have labs (blood work) drawn today and your tests are completely normal, you will receive your results only by: MyChart Message (if you have MyChart) OR A paper copy in the mail If you have any lab test that is abnormal or we need to change your treatment, we will call you to review the results.  Testing/Procedures: NONE ORDERED  TODAY    Follow-Up: At [facility], you and your health needs are our priority.  As part of our continuing mission to provide you with exceptional heart care, our providers are all part of one team.  This team includes your primary Cardiologist (physician) and Advanced Practice Providers or APPs (Physician Assistants and Nurse Practitioners) who all work together to provide you with the care you need, when you need it.  Your next appointment:   6 month(s)  Provider:   York Pellant, MD or Francis Dowse, PA-C   We recommend signing up for the patient portal called "MyChart".  Sign up information is provided on this After Visit Summary.  MyChart is used to connect with patients for Virtual Visits (Telemedicine).  Patients are able to view lab/test results, encounter notes, upcoming appointments, etc.  Non-urgent messages can be sent to your provider as well.   To learn more about what you can do with MyChart, go to [email or web address].   Other Instructions        1st Floor: - Lobby - Registration  - Pharmacy  - Lab - Cafe  2nd Floor: - PV Lab - Diagnostic Testing (echo, CT, nuclear med)  3rd Floor: - Vacant  4th Floor: - TCTS (cardiothoracic surgery) - AFib Clinic - Structural Heart Clinic - Vascular  Surgery  - Vascular Ultrasound  5th Floor: - HeartCare Cardiology (general and EP) - Clinical Pharmacy for coumadin, hypertension, lipid, weight-loss medications, and med management appointments    Valet parking services will be available as well.

## 2023-10-25 DIAGNOSIS — M47816 Spondylosis without myelopathy or radiculopathy, lumbar region: Secondary | ICD-10-CM | POA: Diagnosis not present

## 2023-11-01 DIAGNOSIS — M1712 Unilateral primary osteoarthritis, left knee: Secondary | ICD-10-CM | POA: Diagnosis not present

## 2023-11-01 DIAGNOSIS — Z01818 Encounter for other preprocedural examination: Secondary | ICD-10-CM | POA: Diagnosis not present

## 2023-11-02 ENCOUNTER — Ambulatory Visit: Payer: Medicare Other | Admitting: Student

## 2023-11-07 DIAGNOSIS — M1711 Unilateral primary osteoarthritis, right knee: Secondary | ICD-10-CM | POA: Diagnosis not present

## 2023-11-08 DIAGNOSIS — S61210A Laceration without foreign body of right index finger without damage to nail, initial encounter: Secondary | ICD-10-CM | POA: Diagnosis not present

## 2023-11-10 DIAGNOSIS — S61210D Laceration without foreign body of right index finger without damage to nail, subsequent encounter: Secondary | ICD-10-CM | POA: Diagnosis not present

## 2023-11-21 ENCOUNTER — Ambulatory Visit: Payer: Medicare Other | Admitting: Nurse Practitioner

## 2023-11-27 DIAGNOSIS — M25661 Stiffness of right knee, not elsewhere classified: Secondary | ICD-10-CM | POA: Diagnosis not present

## 2023-11-27 DIAGNOSIS — Z471 Aftercare following joint replacement surgery: Secondary | ICD-10-CM | POA: Diagnosis not present

## 2023-11-27 DIAGNOSIS — G8929 Other chronic pain: Secondary | ICD-10-CM | POA: Diagnosis not present

## 2023-11-27 DIAGNOSIS — R531 Weakness: Secondary | ICD-10-CM | POA: Diagnosis not present

## 2023-11-27 DIAGNOSIS — M1711 Unilateral primary osteoarthritis, right knee: Secondary | ICD-10-CM | POA: Diagnosis not present

## 2023-11-27 DIAGNOSIS — R262 Difficulty in walking, not elsewhere classified: Secondary | ICD-10-CM | POA: Diagnosis not present

## 2023-11-27 DIAGNOSIS — Z96651 Presence of right artificial knee joint: Secondary | ICD-10-CM | POA: Diagnosis not present

## 2023-11-27 DIAGNOSIS — M170 Bilateral primary osteoarthritis of knee: Secondary | ICD-10-CM | POA: Diagnosis not present

## 2023-11-27 DIAGNOSIS — M25561 Pain in right knee: Secondary | ICD-10-CM | POA: Diagnosis not present

## 2023-11-27 DIAGNOSIS — I10 Essential (primary) hypertension: Secondary | ICD-10-CM | POA: Diagnosis not present

## 2023-11-27 DIAGNOSIS — I4891 Unspecified atrial fibrillation: Secondary | ICD-10-CM | POA: Diagnosis not present

## 2023-11-27 DIAGNOSIS — N400 Enlarged prostate without lower urinary tract symptoms: Secondary | ICD-10-CM | POA: Diagnosis not present

## 2023-11-28 DIAGNOSIS — Z471 Aftercare following joint replacement surgery: Secondary | ICD-10-CM | POA: Diagnosis not present

## 2023-11-28 DIAGNOSIS — R531 Weakness: Secondary | ICD-10-CM | POA: Diagnosis not present

## 2023-11-28 DIAGNOSIS — M170 Bilateral primary osteoarthritis of knee: Secondary | ICD-10-CM | POA: Diagnosis not present

## 2023-11-28 DIAGNOSIS — M25561 Pain in right knee: Secondary | ICD-10-CM | POA: Diagnosis not present

## 2023-11-28 DIAGNOSIS — Z96651 Presence of right artificial knee joint: Secondary | ICD-10-CM | POA: Diagnosis not present

## 2023-11-28 DIAGNOSIS — M25661 Stiffness of right knee, not elsewhere classified: Secondary | ICD-10-CM | POA: Diagnosis not present

## 2023-11-29 DIAGNOSIS — Z96651 Presence of right artificial knee joint: Secondary | ICD-10-CM | POA: Diagnosis not present

## 2023-11-29 DIAGNOSIS — M170 Bilateral primary osteoarthritis of knee: Secondary | ICD-10-CM | POA: Diagnosis not present

## 2023-11-29 DIAGNOSIS — M25561 Pain in right knee: Secondary | ICD-10-CM | POA: Diagnosis not present

## 2023-11-29 DIAGNOSIS — Z471 Aftercare following joint replacement surgery: Secondary | ICD-10-CM | POA: Diagnosis not present

## 2023-11-29 DIAGNOSIS — M25661 Stiffness of right knee, not elsewhere classified: Secondary | ICD-10-CM | POA: Diagnosis not present

## 2023-11-29 DIAGNOSIS — R531 Weakness: Secondary | ICD-10-CM | POA: Diagnosis not present

## 2023-11-30 DIAGNOSIS — M25561 Pain in right knee: Secondary | ICD-10-CM | POA: Diagnosis not present

## 2023-11-30 DIAGNOSIS — M25661 Stiffness of right knee, not elsewhere classified: Secondary | ICD-10-CM | POA: Diagnosis not present

## 2023-11-30 DIAGNOSIS — R262 Difficulty in walking, not elsewhere classified: Secondary | ICD-10-CM | POA: Diagnosis not present

## 2023-11-30 DIAGNOSIS — G8918 Other acute postprocedural pain: Secondary | ICD-10-CM | POA: Diagnosis not present

## 2023-12-05 DIAGNOSIS — G8918 Other acute postprocedural pain: Secondary | ICD-10-CM | POA: Diagnosis not present

## 2023-12-05 DIAGNOSIS — M25661 Stiffness of right knee, not elsewhere classified: Secondary | ICD-10-CM | POA: Diagnosis not present

## 2023-12-05 DIAGNOSIS — R262 Difficulty in walking, not elsewhere classified: Secondary | ICD-10-CM | POA: Diagnosis not present

## 2023-12-05 DIAGNOSIS — M25561 Pain in right knee: Secondary | ICD-10-CM | POA: Diagnosis not present

## 2023-12-06 ENCOUNTER — Ambulatory Visit: Payer: Medicare Other | Admitting: Nurse Practitioner

## 2023-12-07 DIAGNOSIS — R262 Difficulty in walking, not elsewhere classified: Secondary | ICD-10-CM | POA: Diagnosis not present

## 2023-12-07 DIAGNOSIS — G8918 Other acute postprocedural pain: Secondary | ICD-10-CM | POA: Diagnosis not present

## 2023-12-07 DIAGNOSIS — M25661 Stiffness of right knee, not elsewhere classified: Secondary | ICD-10-CM | POA: Diagnosis not present

## 2023-12-07 DIAGNOSIS — M25561 Pain in right knee: Secondary | ICD-10-CM | POA: Diagnosis not present

## 2023-12-12 DIAGNOSIS — M25561 Pain in right knee: Secondary | ICD-10-CM | POA: Diagnosis not present

## 2023-12-12 DIAGNOSIS — R262 Difficulty in walking, not elsewhere classified: Secondary | ICD-10-CM | POA: Diagnosis not present

## 2023-12-12 DIAGNOSIS — G8918 Other acute postprocedural pain: Secondary | ICD-10-CM | POA: Diagnosis not present

## 2023-12-12 DIAGNOSIS — M25661 Stiffness of right knee, not elsewhere classified: Secondary | ICD-10-CM | POA: Diagnosis not present

## 2023-12-14 DIAGNOSIS — M25561 Pain in right knee: Secondary | ICD-10-CM | POA: Diagnosis not present

## 2023-12-14 DIAGNOSIS — G8918 Other acute postprocedural pain: Secondary | ICD-10-CM | POA: Diagnosis not present

## 2023-12-14 DIAGNOSIS — R262 Difficulty in walking, not elsewhere classified: Secondary | ICD-10-CM | POA: Diagnosis not present

## 2023-12-14 DIAGNOSIS — M25661 Stiffness of right knee, not elsewhere classified: Secondary | ICD-10-CM | POA: Diagnosis not present

## 2023-12-19 DIAGNOSIS — M25561 Pain in right knee: Secondary | ICD-10-CM | POA: Diagnosis not present

## 2023-12-19 DIAGNOSIS — M25661 Stiffness of right knee, not elsewhere classified: Secondary | ICD-10-CM | POA: Diagnosis not present

## 2023-12-19 DIAGNOSIS — R262 Difficulty in walking, not elsewhere classified: Secondary | ICD-10-CM | POA: Diagnosis not present

## 2023-12-19 DIAGNOSIS — G8918 Other acute postprocedural pain: Secondary | ICD-10-CM | POA: Diagnosis not present

## 2023-12-21 DIAGNOSIS — M25561 Pain in right knee: Secondary | ICD-10-CM | POA: Diagnosis not present

## 2023-12-21 DIAGNOSIS — R262 Difficulty in walking, not elsewhere classified: Secondary | ICD-10-CM | POA: Diagnosis not present

## 2023-12-21 DIAGNOSIS — M25661 Stiffness of right knee, not elsewhere classified: Secondary | ICD-10-CM | POA: Diagnosis not present

## 2023-12-21 DIAGNOSIS — G8918 Other acute postprocedural pain: Secondary | ICD-10-CM | POA: Diagnosis not present

## 2023-12-25 DIAGNOSIS — M25561 Pain in right knee: Secondary | ICD-10-CM | POA: Diagnosis not present

## 2023-12-25 DIAGNOSIS — M25661 Stiffness of right knee, not elsewhere classified: Secondary | ICD-10-CM | POA: Diagnosis not present

## 2023-12-25 DIAGNOSIS — R262 Difficulty in walking, not elsewhere classified: Secondary | ICD-10-CM | POA: Diagnosis not present

## 2023-12-25 DIAGNOSIS — G8918 Other acute postprocedural pain: Secondary | ICD-10-CM | POA: Diagnosis not present

## 2023-12-27 DIAGNOSIS — M25661 Stiffness of right knee, not elsewhere classified: Secondary | ICD-10-CM | POA: Diagnosis not present

## 2023-12-27 DIAGNOSIS — R262 Difficulty in walking, not elsewhere classified: Secondary | ICD-10-CM | POA: Diagnosis not present

## 2023-12-27 DIAGNOSIS — M25561 Pain in right knee: Secondary | ICD-10-CM | POA: Diagnosis not present

## 2023-12-27 DIAGNOSIS — G8918 Other acute postprocedural pain: Secondary | ICD-10-CM | POA: Diagnosis not present

## 2023-12-28 DIAGNOSIS — H04123 Dry eye syndrome of bilateral lacrimal glands: Secondary | ICD-10-CM | POA: Diagnosis not present

## 2024-01-01 DIAGNOSIS — M25561 Pain in right knee: Secondary | ICD-10-CM | POA: Diagnosis not present

## 2024-01-01 DIAGNOSIS — G8918 Other acute postprocedural pain: Secondary | ICD-10-CM | POA: Diagnosis not present

## 2024-01-01 DIAGNOSIS — R262 Difficulty in walking, not elsewhere classified: Secondary | ICD-10-CM | POA: Diagnosis not present

## 2024-01-01 DIAGNOSIS — M25661 Stiffness of right knee, not elsewhere classified: Secondary | ICD-10-CM | POA: Diagnosis not present

## 2024-01-03 DIAGNOSIS — M25561 Pain in right knee: Secondary | ICD-10-CM | POA: Diagnosis not present

## 2024-01-03 DIAGNOSIS — G8918 Other acute postprocedural pain: Secondary | ICD-10-CM | POA: Diagnosis not present

## 2024-01-03 DIAGNOSIS — M25661 Stiffness of right knee, not elsewhere classified: Secondary | ICD-10-CM | POA: Diagnosis not present

## 2024-01-03 DIAGNOSIS — R262 Difficulty in walking, not elsewhere classified: Secondary | ICD-10-CM | POA: Diagnosis not present

## 2024-01-08 DIAGNOSIS — G8918 Other acute postprocedural pain: Secondary | ICD-10-CM | POA: Diagnosis not present

## 2024-01-08 DIAGNOSIS — M25661 Stiffness of right knee, not elsewhere classified: Secondary | ICD-10-CM | POA: Diagnosis not present

## 2024-01-08 DIAGNOSIS — R262 Difficulty in walking, not elsewhere classified: Secondary | ICD-10-CM | POA: Diagnosis not present

## 2024-01-08 DIAGNOSIS — M25561 Pain in right knee: Secondary | ICD-10-CM | POA: Diagnosis not present

## 2024-01-09 DIAGNOSIS — H04123 Dry eye syndrome of bilateral lacrimal glands: Secondary | ICD-10-CM | POA: Diagnosis not present

## 2024-01-11 DIAGNOSIS — M25561 Pain in right knee: Secondary | ICD-10-CM | POA: Diagnosis not present

## 2024-01-11 DIAGNOSIS — G8918 Other acute postprocedural pain: Secondary | ICD-10-CM | POA: Diagnosis not present

## 2024-01-11 DIAGNOSIS — M25661 Stiffness of right knee, not elsewhere classified: Secondary | ICD-10-CM | POA: Diagnosis not present

## 2024-01-11 DIAGNOSIS — R262 Difficulty in walking, not elsewhere classified: Secondary | ICD-10-CM | POA: Diagnosis not present

## 2024-01-17 DIAGNOSIS — M25561 Pain in right knee: Secondary | ICD-10-CM | POA: Diagnosis not present

## 2024-01-17 DIAGNOSIS — M25661 Stiffness of right knee, not elsewhere classified: Secondary | ICD-10-CM | POA: Diagnosis not present

## 2024-01-17 DIAGNOSIS — R262 Difficulty in walking, not elsewhere classified: Secondary | ICD-10-CM | POA: Diagnosis not present

## 2024-01-17 DIAGNOSIS — G8918 Other acute postprocedural pain: Secondary | ICD-10-CM | POA: Diagnosis not present

## 2024-01-19 DIAGNOSIS — M25561 Pain in right knee: Secondary | ICD-10-CM | POA: Diagnosis not present

## 2024-01-19 DIAGNOSIS — Z96651 Presence of right artificial knee joint: Secondary | ICD-10-CM | POA: Diagnosis not present

## 2024-01-24 DIAGNOSIS — M25661 Stiffness of right knee, not elsewhere classified: Secondary | ICD-10-CM | POA: Diagnosis not present

## 2024-01-24 DIAGNOSIS — G8918 Other acute postprocedural pain: Secondary | ICD-10-CM | POA: Diagnosis not present

## 2024-01-24 DIAGNOSIS — M25561 Pain in right knee: Secondary | ICD-10-CM | POA: Diagnosis not present

## 2024-01-24 DIAGNOSIS — R262 Difficulty in walking, not elsewhere classified: Secondary | ICD-10-CM | POA: Diagnosis not present

## 2024-01-26 DIAGNOSIS — M25661 Stiffness of right knee, not elsewhere classified: Secondary | ICD-10-CM | POA: Diagnosis not present

## 2024-01-26 DIAGNOSIS — G8918 Other acute postprocedural pain: Secondary | ICD-10-CM | POA: Diagnosis not present

## 2024-01-26 DIAGNOSIS — M25561 Pain in right knee: Secondary | ICD-10-CM | POA: Diagnosis not present

## 2024-01-26 DIAGNOSIS — R262 Difficulty in walking, not elsewhere classified: Secondary | ICD-10-CM | POA: Diagnosis not present

## 2024-01-29 DIAGNOSIS — Z96651 Presence of right artificial knee joint: Secondary | ICD-10-CM | POA: Diagnosis not present

## 2024-01-30 ENCOUNTER — Encounter (HOSPITAL_COMMUNITY): Payer: Self-pay | Admitting: *Deleted

## 2024-02-02 DIAGNOSIS — G8918 Other acute postprocedural pain: Secondary | ICD-10-CM | POA: Diagnosis not present

## 2024-02-02 DIAGNOSIS — M25661 Stiffness of right knee, not elsewhere classified: Secondary | ICD-10-CM | POA: Diagnosis not present

## 2024-02-02 DIAGNOSIS — M25561 Pain in right knee: Secondary | ICD-10-CM | POA: Diagnosis not present

## 2024-02-02 DIAGNOSIS — R262 Difficulty in walking, not elsewhere classified: Secondary | ICD-10-CM | POA: Diagnosis not present

## 2024-02-05 DIAGNOSIS — S838X1A Sprain of other specified parts of right knee, initial encounter: Secondary | ICD-10-CM | POA: Diagnosis not present

## 2024-02-05 DIAGNOSIS — M25561 Pain in right knee: Secondary | ICD-10-CM | POA: Diagnosis not present

## 2024-02-05 DIAGNOSIS — S76111A Strain of right quadriceps muscle, fascia and tendon, initial encounter: Secondary | ICD-10-CM | POA: Diagnosis not present

## 2024-02-07 DIAGNOSIS — G8918 Other acute postprocedural pain: Secondary | ICD-10-CM | POA: Diagnosis not present

## 2024-02-07 DIAGNOSIS — M25561 Pain in right knee: Secondary | ICD-10-CM | POA: Diagnosis not present

## 2024-02-07 DIAGNOSIS — R262 Difficulty in walking, not elsewhere classified: Secondary | ICD-10-CM | POA: Diagnosis not present

## 2024-02-07 DIAGNOSIS — M25661 Stiffness of right knee, not elsewhere classified: Secondary | ICD-10-CM | POA: Diagnosis not present

## 2024-02-13 DIAGNOSIS — Z96653 Presence of artificial knee joint, bilateral: Secondary | ICD-10-CM | POA: Diagnosis not present

## 2024-02-13 DIAGNOSIS — Z8679 Personal history of other diseases of the circulatory system: Secondary | ICD-10-CM | POA: Diagnosis not present

## 2024-02-13 DIAGNOSIS — G8929 Other chronic pain: Secondary | ICD-10-CM | POA: Diagnosis not present

## 2024-02-13 DIAGNOSIS — M25561 Pain in right knee: Secondary | ICD-10-CM | POA: Diagnosis not present

## 2024-02-13 DIAGNOSIS — M65861 Other synovitis and tenosynovitis, right lower leg: Secondary | ICD-10-CM | POA: Diagnosis not present

## 2024-02-13 DIAGNOSIS — M24661 Ankylosis, right knee: Secondary | ICD-10-CM | POA: Diagnosis not present

## 2024-02-13 DIAGNOSIS — I10 Essential (primary) hypertension: Secondary | ICD-10-CM | POA: Diagnosis not present

## 2024-02-13 DIAGNOSIS — T8453XA Infection and inflammatory reaction due to internal right knee prosthesis, initial encounter: Secondary | ICD-10-CM | POA: Diagnosis not present

## 2024-02-14 DIAGNOSIS — Z8679 Personal history of other diseases of the circulatory system: Secondary | ICD-10-CM | POA: Diagnosis not present

## 2024-02-14 DIAGNOSIS — Z96653 Presence of artificial knee joint, bilateral: Secondary | ICD-10-CM | POA: Diagnosis not present

## 2024-02-14 DIAGNOSIS — T8453XA Infection and inflammatory reaction due to internal right knee prosthesis, initial encounter: Secondary | ICD-10-CM | POA: Diagnosis not present

## 2024-02-14 DIAGNOSIS — G8929 Other chronic pain: Secondary | ICD-10-CM | POA: Diagnosis not present

## 2024-02-14 DIAGNOSIS — M25561 Pain in right knee: Secondary | ICD-10-CM | POA: Diagnosis not present

## 2024-02-14 DIAGNOSIS — I10 Essential (primary) hypertension: Secondary | ICD-10-CM | POA: Diagnosis not present

## 2024-02-15 DIAGNOSIS — M25561 Pain in right knee: Secondary | ICD-10-CM | POA: Diagnosis not present

## 2024-02-15 DIAGNOSIS — R262 Difficulty in walking, not elsewhere classified: Secondary | ICD-10-CM | POA: Diagnosis not present

## 2024-02-15 DIAGNOSIS — M25661 Stiffness of right knee, not elsewhere classified: Secondary | ICD-10-CM | POA: Diagnosis not present

## 2024-02-15 DIAGNOSIS — G8918 Other acute postprocedural pain: Secondary | ICD-10-CM | POA: Diagnosis not present

## 2024-02-15 DIAGNOSIS — Z4789 Encounter for other orthopedic aftercare: Secondary | ICD-10-CM | POA: Diagnosis not present

## 2024-02-19 DIAGNOSIS — M25661 Stiffness of right knee, not elsewhere classified: Secondary | ICD-10-CM | POA: Diagnosis not present

## 2024-02-19 DIAGNOSIS — M25561 Pain in right knee: Secondary | ICD-10-CM | POA: Diagnosis not present

## 2024-02-19 DIAGNOSIS — R262 Difficulty in walking, not elsewhere classified: Secondary | ICD-10-CM | POA: Diagnosis not present

## 2024-02-19 DIAGNOSIS — Z4789 Encounter for other orthopedic aftercare: Secondary | ICD-10-CM | POA: Diagnosis not present

## 2024-02-19 DIAGNOSIS — G8918 Other acute postprocedural pain: Secondary | ICD-10-CM | POA: Diagnosis not present

## 2024-02-21 DIAGNOSIS — Z4789 Encounter for other orthopedic aftercare: Secondary | ICD-10-CM | POA: Diagnosis not present

## 2024-02-21 DIAGNOSIS — R262 Difficulty in walking, not elsewhere classified: Secondary | ICD-10-CM | POA: Diagnosis not present

## 2024-02-21 DIAGNOSIS — G8918 Other acute postprocedural pain: Secondary | ICD-10-CM | POA: Diagnosis not present

## 2024-02-21 DIAGNOSIS — M25561 Pain in right knee: Secondary | ICD-10-CM | POA: Diagnosis not present

## 2024-02-21 DIAGNOSIS — M25661 Stiffness of right knee, not elsewhere classified: Secondary | ICD-10-CM | POA: Diagnosis not present

## 2024-02-28 DIAGNOSIS — Z4789 Encounter for other orthopedic aftercare: Secondary | ICD-10-CM | POA: Diagnosis not present

## 2024-02-28 DIAGNOSIS — M25661 Stiffness of right knee, not elsewhere classified: Secondary | ICD-10-CM | POA: Diagnosis not present

## 2024-02-28 DIAGNOSIS — G8918 Other acute postprocedural pain: Secondary | ICD-10-CM | POA: Diagnosis not present

## 2024-02-28 DIAGNOSIS — R262 Difficulty in walking, not elsewhere classified: Secondary | ICD-10-CM | POA: Diagnosis not present

## 2024-02-28 DIAGNOSIS — M25561 Pain in right knee: Secondary | ICD-10-CM | POA: Diagnosis not present

## 2024-03-01 DIAGNOSIS — G8918 Other acute postprocedural pain: Secondary | ICD-10-CM | POA: Diagnosis not present

## 2024-03-01 DIAGNOSIS — M25661 Stiffness of right knee, not elsewhere classified: Secondary | ICD-10-CM | POA: Diagnosis not present

## 2024-03-01 DIAGNOSIS — R262 Difficulty in walking, not elsewhere classified: Secondary | ICD-10-CM | POA: Diagnosis not present

## 2024-03-01 DIAGNOSIS — M25561 Pain in right knee: Secondary | ICD-10-CM | POA: Diagnosis not present

## 2024-03-01 DIAGNOSIS — Z4789 Encounter for other orthopedic aftercare: Secondary | ICD-10-CM | POA: Diagnosis not present

## 2024-03-04 DIAGNOSIS — R262 Difficulty in walking, not elsewhere classified: Secondary | ICD-10-CM | POA: Diagnosis not present

## 2024-03-04 DIAGNOSIS — M25661 Stiffness of right knee, not elsewhere classified: Secondary | ICD-10-CM | POA: Diagnosis not present

## 2024-03-04 DIAGNOSIS — G8918 Other acute postprocedural pain: Secondary | ICD-10-CM | POA: Diagnosis not present

## 2024-03-04 DIAGNOSIS — M25561 Pain in right knee: Secondary | ICD-10-CM | POA: Diagnosis not present

## 2024-03-04 DIAGNOSIS — Z4789 Encounter for other orthopedic aftercare: Secondary | ICD-10-CM | POA: Diagnosis not present

## 2024-03-07 DIAGNOSIS — M25561 Pain in right knee: Secondary | ICD-10-CM | POA: Diagnosis not present

## 2024-03-07 DIAGNOSIS — G8918 Other acute postprocedural pain: Secondary | ICD-10-CM | POA: Diagnosis not present

## 2024-03-07 DIAGNOSIS — Z4789 Encounter for other orthopedic aftercare: Secondary | ICD-10-CM | POA: Diagnosis not present

## 2024-03-07 DIAGNOSIS — R262 Difficulty in walking, not elsewhere classified: Secondary | ICD-10-CM | POA: Diagnosis not present

## 2024-03-07 DIAGNOSIS — M25661 Stiffness of right knee, not elsewhere classified: Secondary | ICD-10-CM | POA: Diagnosis not present

## 2024-03-12 DIAGNOSIS — M25561 Pain in right knee: Secondary | ICD-10-CM | POA: Diagnosis not present

## 2024-03-12 DIAGNOSIS — R262 Difficulty in walking, not elsewhere classified: Secondary | ICD-10-CM | POA: Diagnosis not present

## 2024-03-12 DIAGNOSIS — Z4789 Encounter for other orthopedic aftercare: Secondary | ICD-10-CM | POA: Diagnosis not present

## 2024-03-12 DIAGNOSIS — G8918 Other acute postprocedural pain: Secondary | ICD-10-CM | POA: Diagnosis not present

## 2024-03-12 DIAGNOSIS — M25661 Stiffness of right knee, not elsewhere classified: Secondary | ICD-10-CM | POA: Diagnosis not present

## 2024-03-15 DIAGNOSIS — G8918 Other acute postprocedural pain: Secondary | ICD-10-CM | POA: Diagnosis not present

## 2024-03-15 DIAGNOSIS — M25661 Stiffness of right knee, not elsewhere classified: Secondary | ICD-10-CM | POA: Diagnosis not present

## 2024-03-15 DIAGNOSIS — Z4789 Encounter for other orthopedic aftercare: Secondary | ICD-10-CM | POA: Diagnosis not present

## 2024-03-15 DIAGNOSIS — R262 Difficulty in walking, not elsewhere classified: Secondary | ICD-10-CM | POA: Diagnosis not present

## 2024-03-15 DIAGNOSIS — M25561 Pain in right knee: Secondary | ICD-10-CM | POA: Diagnosis not present

## 2024-03-19 DIAGNOSIS — M25661 Stiffness of right knee, not elsewhere classified: Secondary | ICD-10-CM | POA: Diagnosis not present

## 2024-03-19 DIAGNOSIS — M25561 Pain in right knee: Secondary | ICD-10-CM | POA: Diagnosis not present

## 2024-03-19 DIAGNOSIS — R262 Difficulty in walking, not elsewhere classified: Secondary | ICD-10-CM | POA: Diagnosis not present

## 2024-03-19 DIAGNOSIS — G8918 Other acute postprocedural pain: Secondary | ICD-10-CM | POA: Diagnosis not present

## 2024-03-19 DIAGNOSIS — Z4789 Encounter for other orthopedic aftercare: Secondary | ICD-10-CM | POA: Diagnosis not present

## 2024-03-21 DIAGNOSIS — M25661 Stiffness of right knee, not elsewhere classified: Secondary | ICD-10-CM | POA: Diagnosis not present

## 2024-03-21 DIAGNOSIS — Z4789 Encounter for other orthopedic aftercare: Secondary | ICD-10-CM | POA: Diagnosis not present

## 2024-03-21 DIAGNOSIS — G8918 Other acute postprocedural pain: Secondary | ICD-10-CM | POA: Diagnosis not present

## 2024-03-21 DIAGNOSIS — R262 Difficulty in walking, not elsewhere classified: Secondary | ICD-10-CM | POA: Diagnosis not present

## 2024-03-21 DIAGNOSIS — M25561 Pain in right knee: Secondary | ICD-10-CM | POA: Diagnosis not present

## 2024-03-25 DIAGNOSIS — M25561 Pain in right knee: Secondary | ICD-10-CM | POA: Diagnosis not present

## 2024-03-25 DIAGNOSIS — Z4789 Encounter for other orthopedic aftercare: Secondary | ICD-10-CM | POA: Diagnosis not present

## 2024-03-25 DIAGNOSIS — R262 Difficulty in walking, not elsewhere classified: Secondary | ICD-10-CM | POA: Diagnosis not present

## 2024-03-25 DIAGNOSIS — G8918 Other acute postprocedural pain: Secondary | ICD-10-CM | POA: Diagnosis not present

## 2024-03-25 DIAGNOSIS — M25661 Stiffness of right knee, not elsewhere classified: Secondary | ICD-10-CM | POA: Diagnosis not present

## 2024-03-27 DIAGNOSIS — Z4789 Encounter for other orthopedic aftercare: Secondary | ICD-10-CM | POA: Diagnosis not present

## 2024-03-27 DIAGNOSIS — M25561 Pain in right knee: Secondary | ICD-10-CM | POA: Diagnosis not present

## 2024-03-27 DIAGNOSIS — R262 Difficulty in walking, not elsewhere classified: Secondary | ICD-10-CM | POA: Diagnosis not present

## 2024-03-27 DIAGNOSIS — G8918 Other acute postprocedural pain: Secondary | ICD-10-CM | POA: Diagnosis not present

## 2024-03-27 DIAGNOSIS — M25661 Stiffness of right knee, not elsewhere classified: Secondary | ICD-10-CM | POA: Diagnosis not present

## 2024-04-01 DIAGNOSIS — Z4789 Encounter for other orthopedic aftercare: Secondary | ICD-10-CM | POA: Diagnosis not present

## 2024-04-01 DIAGNOSIS — M25561 Pain in right knee: Secondary | ICD-10-CM | POA: Diagnosis not present

## 2024-04-01 DIAGNOSIS — G8918 Other acute postprocedural pain: Secondary | ICD-10-CM | POA: Diagnosis not present

## 2024-04-01 DIAGNOSIS — R262 Difficulty in walking, not elsewhere classified: Secondary | ICD-10-CM | POA: Diagnosis not present

## 2024-04-01 DIAGNOSIS — M25661 Stiffness of right knee, not elsewhere classified: Secondary | ICD-10-CM | POA: Diagnosis not present

## 2024-04-03 DIAGNOSIS — M25661 Stiffness of right knee, not elsewhere classified: Secondary | ICD-10-CM | POA: Diagnosis not present

## 2024-04-03 DIAGNOSIS — M25561 Pain in right knee: Secondary | ICD-10-CM | POA: Diagnosis not present

## 2024-04-03 DIAGNOSIS — R262 Difficulty in walking, not elsewhere classified: Secondary | ICD-10-CM | POA: Diagnosis not present

## 2024-04-03 DIAGNOSIS — G8918 Other acute postprocedural pain: Secondary | ICD-10-CM | POA: Diagnosis not present

## 2024-04-03 DIAGNOSIS — Z4789 Encounter for other orthopedic aftercare: Secondary | ICD-10-CM | POA: Diagnosis not present

## 2024-04-09 DIAGNOSIS — M25661 Stiffness of right knee, not elsewhere classified: Secondary | ICD-10-CM | POA: Diagnosis not present

## 2024-04-09 DIAGNOSIS — Z4789 Encounter for other orthopedic aftercare: Secondary | ICD-10-CM | POA: Diagnosis not present

## 2024-04-09 DIAGNOSIS — M25561 Pain in right knee: Secondary | ICD-10-CM | POA: Diagnosis not present

## 2024-04-09 DIAGNOSIS — R262 Difficulty in walking, not elsewhere classified: Secondary | ICD-10-CM | POA: Diagnosis not present

## 2024-04-09 DIAGNOSIS — G8918 Other acute postprocedural pain: Secondary | ICD-10-CM | POA: Diagnosis not present

## 2024-04-11 DIAGNOSIS — Z4789 Encounter for other orthopedic aftercare: Secondary | ICD-10-CM | POA: Diagnosis not present

## 2024-04-11 DIAGNOSIS — M25661 Stiffness of right knee, not elsewhere classified: Secondary | ICD-10-CM | POA: Diagnosis not present

## 2024-04-11 DIAGNOSIS — R262 Difficulty in walking, not elsewhere classified: Secondary | ICD-10-CM | POA: Diagnosis not present

## 2024-04-11 DIAGNOSIS — M25561 Pain in right knee: Secondary | ICD-10-CM | POA: Diagnosis not present

## 2024-04-11 DIAGNOSIS — G8918 Other acute postprocedural pain: Secondary | ICD-10-CM | POA: Diagnosis not present

## 2024-04-18 DIAGNOSIS — H01001 Unspecified blepharitis right upper eyelid: Secondary | ICD-10-CM | POA: Diagnosis not present

## 2024-04-18 DIAGNOSIS — G8929 Other chronic pain: Secondary | ICD-10-CM | POA: Diagnosis not present

## 2024-04-18 DIAGNOSIS — H01004 Unspecified blepharitis left upper eyelid: Secondary | ICD-10-CM | POA: Diagnosis not present

## 2024-04-18 DIAGNOSIS — M545 Low back pain, unspecified: Secondary | ICD-10-CM | POA: Diagnosis not present

## 2024-04-18 DIAGNOSIS — Z961 Presence of intraocular lens: Secondary | ICD-10-CM | POA: Diagnosis not present

## 2024-04-18 DIAGNOSIS — M47816 Spondylosis without myelopathy or radiculopathy, lumbar region: Secondary | ICD-10-CM | POA: Diagnosis not present

## 2024-04-18 DIAGNOSIS — H26493 Other secondary cataract, bilateral: Secondary | ICD-10-CM | POA: Diagnosis not present

## 2024-04-23 DIAGNOSIS — I10 Essential (primary) hypertension: Secondary | ICD-10-CM | POA: Diagnosis not present

## 2024-04-23 DIAGNOSIS — E782 Mixed hyperlipidemia: Secondary | ICD-10-CM | POA: Diagnosis not present

## 2024-04-23 DIAGNOSIS — B354 Tinea corporis: Secondary | ICD-10-CM | POA: Diagnosis not present

## 2024-04-23 DIAGNOSIS — L739 Follicular disorder, unspecified: Secondary | ICD-10-CM | POA: Diagnosis not present

## 2024-04-23 DIAGNOSIS — E559 Vitamin D deficiency, unspecified: Secondary | ICD-10-CM | POA: Diagnosis not present

## 2024-04-23 DIAGNOSIS — Z23 Encounter for immunization: Secondary | ICD-10-CM | POA: Diagnosis not present

## 2024-04-23 DIAGNOSIS — I4891 Unspecified atrial fibrillation: Secondary | ICD-10-CM | POA: Diagnosis not present

## 2024-04-23 DIAGNOSIS — Z6831 Body mass index (BMI) 31.0-31.9, adult: Secondary | ICD-10-CM | POA: Diagnosis not present

## 2024-05-03 DIAGNOSIS — Z96651 Presence of right artificial knee joint: Secondary | ICD-10-CM | POA: Diagnosis not present

## 2024-05-03 DIAGNOSIS — M25561 Pain in right knee: Secondary | ICD-10-CM | POA: Diagnosis not present

## 2024-05-17 DIAGNOSIS — I10 Essential (primary) hypertension: Secondary | ICD-10-CM | POA: Diagnosis not present

## 2024-05-17 DIAGNOSIS — G5790 Unspecified mononeuropathy of unspecified lower limb: Secondary | ICD-10-CM | POA: Diagnosis not present

## 2024-05-17 DIAGNOSIS — R200 Anesthesia of skin: Secondary | ICD-10-CM | POA: Diagnosis not present

## 2024-05-28 DIAGNOSIS — R200 Anesthesia of skin: Secondary | ICD-10-CM | POA: Diagnosis not present

## 2024-05-28 DIAGNOSIS — Z85828 Personal history of other malignant neoplasm of skin: Secondary | ICD-10-CM | POA: Diagnosis not present

## 2024-05-28 DIAGNOSIS — C44329 Squamous cell carcinoma of skin of other parts of face: Secondary | ICD-10-CM | POA: Diagnosis not present

## 2024-06-13 DIAGNOSIS — M47816 Spondylosis without myelopathy or radiculopathy, lumbar region: Secondary | ICD-10-CM | POA: Diagnosis not present

## 2024-06-20 ENCOUNTER — Encounter: Payer: Self-pay | Admitting: Cardiovascular Disease

## 2024-06-20 ENCOUNTER — Ambulatory Visit: Attending: Cardiovascular Disease | Admitting: Cardiovascular Disease

## 2024-06-20 VITALS — BP 119/74 | HR 67 | Resp 16 | Ht 72.0 in | Wt 235.6 lb

## 2024-06-20 DIAGNOSIS — Z79899 Other long term (current) drug therapy: Secondary | ICD-10-CM | POA: Insufficient documentation

## 2024-06-20 DIAGNOSIS — I480 Paroxysmal atrial fibrillation: Secondary | ICD-10-CM | POA: Diagnosis present

## 2024-06-20 LAB — BASIC METABOLIC PANEL WITH GFR
BUN/Creatinine Ratio: 19 (ref 10–24)
BUN: 16 mg/dL (ref 8–27)
CO2: 25 mmol/L (ref 20–29)
Calcium: 10 mg/dL (ref 8.6–10.2)
Chloride: 103 mmol/L (ref 96–106)
Creatinine, Ser: 0.86 mg/dL (ref 0.76–1.27)
Glucose: 91 mg/dL (ref 70–99)
Potassium: 4.5 mmol/L (ref 3.5–5.2)
Sodium: 143 mmol/L (ref 134–144)
eGFR: 89 mL/min/1.73 (ref >59)

## 2024-06-20 LAB — MAGNESIUM: Magnesium: 2.1 mg/dL (ref 1.6–2.3)

## 2024-06-20 NOTE — Progress Notes (Signed)
 Electrophysiology Office Note:    Date:  06/20/2024   ID:  [name], DOB 06-27-1946, MRN [id number]  PCP:  Dorcus Lamar POUR, MD   [facility] Providers Cardiologist:  Lynwood Rakers, MD (Inactive) Electrophysiologist:  Eulas FORBES Furbish, MD     Referring MD: Dorcus Lamar POUR, MD   History of Present Illness:    [name] is a 78 y.o. male with a hx listed below, significant for hypertension, hyperlipidemia, atrial fibrillation, referred for arrhythmia management.  He has a medtronic loop recorder placed in 2018 for atrial fibrillation. The device is now at EOS.  He has been maintained on flecainide  with normal sinus rhythm.  He does noticed that he has had progressive fatigue and shortness of breath but no palpitations to indicate recurrence of atrial fibrillation.  Past Medical History:  Diagnosis Date   A-fib (HCC) 2018   on eliquis    Anxiety    Arthritis    Benign prostatic hyperplasia    BPH (benign prostatic hypertrophy)    Carpal tunnel syndrome, bilateral    Early cataract    GERD (gastroesophageal reflux disease)    History of SCC (squamous cell carcinoma) of skin 10/04/2016   HNP (herniated nucleus pulposus), cervical    Hyperlipidemia    Hypertension 2007   IBS (irritable bowel syndrome)    Obesity    Other abnormal glucose    Pneumonia     walking   Pre-diabetes    Vitamin D  deficiency    Wears glasses     Past Surgical History:  Procedure Laterality Date   ANTERIOR CERVICAL DECOMP/DISCECTOMY FUSION  01-25-2002   C4 - C5   ANTERIOR CERVICAL DECOMP/DISCECTOMY FUSION Bilateral 07/16/2018   Procedure: REMOVAL OF ANTERIOR CERVICAL PLATE, ANTERIOR CERVICAL DECOMPRESSION/DISCECTOMY FUSION CERVICAL THREE- CERVICAL FOUR;  Surgeon: Alix Lamar, MD;  Location: [facility] OR;  Service: Neurosurgery;  Laterality: Bilateral;  REMOVAL OF ANTERIOR CERVICAL PLATE, ANTERIOR CERVICAL DECOMPRESSION/DISCECTOMY FUSION CERVICAL THREE- CERVICAL FOUR    ANTERIOR CRUCIATE LIGAMENT REPAIR  1990   RIGHT   APPENDECTOMY  02-07-2007   CARPAL TUNNEL RELEASE Right 07/16/2018   Procedure: CARPAL TUNNEL RELEASE;  Surgeon: Alix Lamar, MD;  Location[facility] OR;  Service: Neurosurgery;  Laterality: Right;  CARPAL TUNNEL RELEASE   CATARACT EXTRACTION Right 01/2020   CERVICAL FUSION  2000   C5 - 6   COLONOSCOPY  05-30-2011   HEMORRHOIDS   HERNIA REPAIR     HIP SURGERY  1999   LEFT HIP --  REMOVAL CALCIUM  BUILD-UP   LEFT ANKLE SURG.  1980   LEFT INGUINAL HERNIA REPAIR  1970   LEFT ROTATOR CUFF REPAIR  2002   LOOP RECORDER INSERTION N/A 09/19/2016   Procedure: Loop Recorder Insertion;  Surgeon: Lynwood Rakers, MD;  Location: [facility];  Service: Cardiovascular;  Laterality: N/A;   REVERSE SHOULDER ARTHROPLASTY Right 01/06/2022   Procedure: REVERSE SHOULDER ARTHROPLASTY;  Surgeon: Cristy Bonner DASEN, MD;  Location: [facility];  Service: Orthopedics;  Laterality: Right;   TENDON RECONSTRUCTION Left 07/17/2013   Procedure: LEFT ELBOW LATERAL RECONSTRUCTION;  Surgeon: Donnice DELENA Robinsons, MD;  Location: [facility];  Service: Orthopedics;  Laterality: Left;   TONSILLECTOMY     TRIGGER FINGER RELEASE Right 07/17/2013   Procedure: RELEASE A-1 PULLEY RIGHT RING FINGER;  Surgeon: Donnice DELENA Robinsons, MD;  Location: [facility];  Service: Orthopedics;  Laterality: Right;   UMBILICAL HERNIA REPAIR  2003   XI ROBOTIC ASSISTED  SIMPLE PROSTATECTOMY N/A 05/22/2019   Procedure: XI ROBOTIC ASSISTED SIMPLE PROSTATECTOMY;  Surgeon: Alvaro Hummer, MD;  Location: [facility];  Service: Urology;  Laterality: N/A;  3 HRS    Current Medications: Current Meds  Medication Sig   carvedilol  (COREG ) 6.25 MG tablet Take 1 tablet (6.25 mg total) by mouth 2 (two) times daily.   Cholecalciferol (VITAMIN D3) 5000 units CAPS Take 10,000 Units by mouth daily.    ELIQUIS  5 MG TABS tablet Take 1 tablet (5 mg total) by mouth 2 (two) times daily.    escitalopram  (LEXAPRO ) 20 MG tablet Take 1/2 tablet (10 mg) daily.   finasteride  (PROSCAR ) 5 MG tablet Take 5 mg by mouth daily.   Flaxseed, Linseed, (FLAX SEED OIL PO) Take 1 capsule by mouth daily.   flecainide  (TAMBOCOR ) 100 MG tablet TAKE 1 TABLET BY MOUTH TWICE  DAILY   losartan  (COZAAR ) 50 MG tablet Take 1 tablet (50 mg total) by mouth daily.   Multiple Vitamin (MULTIVITAMIN) tablet Take 1 tablet by mouth daily.   Omega-3 Fatty Acids (FISH OIL PO) Take 1 capsule by mouth 2 (two) times daily.   rosuvastatin  (CRESTOR ) 20 MG tablet Take  1 whole  tablet (20 mg)  Daily for Cholesterol   traZODone  (DESYREL ) 150 MG tablet Take  1/2 to 1 tablet   1 to 2 hours  before Bedtime  as needed for  Sleep                                               /                                                                   TAKE                                         BY                                                 MOUTH   verapamil  (CALAN -SR) 120 MG CR tablet Take  1 tablet  Daily  with Food  for BP & Heart Rhythm   vitamin C  (ASCORBIC ACID) 500 MG tablet Take 500 mg by mouth daily.   zinc gluconate 50 MG tablet Take 50 mg by mouth daily.     Allergies:   Xarelto  [rivaroxaban ], Imdur  [isosorbide  nitrate], Atorvastatin, Clonazepam, and Tamsulosin hcl   Social and Family History: Reviewed in Epic  ROS:   Please see the history of present illness.    All other systems reviewed and are negative.  EKGs/Labs/Other Studies Reviewed Today:    Echocardiogram:  TTE June 2024: EF 55 to 60%.  Grade 2 diastolic dysfunction.  Mild asymmetric LVH.  Moderately dilated left atrium.  Degenerative mitral valve no stenosis mild aortic valve stenosis   Monitors:   Stress testing:   Advanced imaging:   Cardiac catherization  EKG:  Last EKG results: today -sinus rhythm, 60 bpm    Recent Labs: No results found for requested labs within last 365 days.     Physical Exam:    VS:  BP 119/74 (BP Location:  Left Arm, Patient Position: Sitting, Cuff Size: Normal)   Pulse 67   Resp 16   Ht 6' (1.829 m)   Wt 235 lb 9.6 oz (106.9 kg)   SpO2 94%   BMI 31.95 kg/m     Wt Readings from Last 3 Encounters:  06/20/24 235 lb 9.6 oz (106.9 kg)  10/19/23 235 lb 12.8 oz (107 kg)  06/28/23 239 lb 3.2 oz (108.5 kg)     GEN:  Well nourished, well developed in no acute distress CARDIAC: RRR, no murmurs, rubs, gallops RESPIRATORY:  Normal work of breathing MUSCULOSKELETAL: no edema    ASSESSMENT & PLAN:    Paroxysmal atrial fibrillation and flutter Remains in sinus rhythm on flecainide  Continue carvedilol  6.25 mg Check basic metabolic panel and magnesium   Secondary hypercoagulable state Continue eliquis  5mg  PO BID Dc mobic  due to increased risk of bleeding  Hypertension BP controlled  Fatigue and murmur Will order TTE  Loop recorder in place Device battery has expired He would like to have the loop recorder removed, so we will schedule to have this done       Medication Adjustments/Labs and Tests Ordered: Current medicines are reviewed at length with the patient today.  Concerns regarding medicines are outlined above.  Orders Placed This Encounter  Procedures   EKG 12-Lead   No orders of the defined types were placed in this encounter.    Signed, Eulas FORBES Furbish, MD  06/20/2024 1:52 PM    [facility]

## 2024-06-20 NOTE — Patient Instructions (Signed)
 Medication Instructions:  Your physician recommends that you continue on your current medications as directed. Please refer to the Current Medication list given to you today.  *If you need a refill on your cardiac medications before your next appointment, please call your pharmacy*  Lab Work: BMET and Magnesium  today  If you have labs (blood work) drawn today and your tests are completely normal, you will receive your results only by: MyChart Message (if you have MyChart) OR A paper copy in the mail If you have any lab test that is abnormal or we need to change your treatment, we will call you to review the results.  Testing/Procedures: None ordered.   Follow-Up: At [facility], you and your health needs are our priority.  As part of our continuing mission to provide you with exceptional heart care, our providers are all part of one team.  This team includes your primary Cardiologist (physician) and Advanced Practice Providers or APPs (Physician Assistants and Nurse Practitioners) who all work together to provide you with the care you need, when you need it.  Your next appointment:   As scheduled

## 2024-06-20 NOTE — Addendum Note (Signed)
 Addended by: CASIMIR ALDONA BRAVO on: 06/20/2024 02:03 PM   Modules accepted: Orders

## 2024-06-21 ENCOUNTER — Ambulatory Visit: Payer: Self-pay

## 2024-08-07 ENCOUNTER — Ambulatory Visit: Admitting: Cardiovascular Disease
